# Patient Record
Sex: Female | Born: 1935
Health system: Southern US, Community
[De-identification: ages and names within clinical notes are randomized; demographics above are authoritative.]

## PROBLEM LIST (undated history)

## (undated) DIAGNOSIS — H9190 Unspecified hearing loss, unspecified ear: Secondary | ICD-10-CM

## (undated) DIAGNOSIS — N3090 Cystitis, unspecified without hematuria: Secondary | ICD-10-CM

## (undated) DIAGNOSIS — C801 Malignant (primary) neoplasm, unspecified: Secondary | ICD-10-CM

## (undated) DIAGNOSIS — I10 Essential (primary) hypertension: Secondary | ICD-10-CM

## (undated) HISTORY — PX: BREAST LUMPECTOMY: SHX2

## (undated) HISTORY — PX: OTHER SURGICAL HISTORY: SHX169

## (undated) HISTORY — PX: BREAST SURGERY: SHX581

## (undated) HISTORY — PX: ABDOMINAL HYSTERECTOMY: SHX81

---

## 1997-10-08 ENCOUNTER — Other Ambulatory Visit: Admission: RE | Admit: 1997-10-08 | Discharge: 1997-10-08 | Payer: Self-pay | Admitting: Gynecology

## 1999-11-29 ENCOUNTER — Other Ambulatory Visit: Admission: RE | Admit: 1999-11-29 | Discharge: 1999-11-29 | Payer: Self-pay | Admitting: Gynecology

## 2002-12-22 ENCOUNTER — Other Ambulatory Visit: Admission: RE | Admit: 2002-12-22 | Discharge: 2002-12-22 | Payer: Self-pay | Admitting: Gynecology

## 2013-07-02 ENCOUNTER — Other Ambulatory Visit: Payer: Self-pay | Admitting: Gynecology

## 2013-07-02 DIAGNOSIS — R928 Other abnormal and inconclusive findings on diagnostic imaging of breast: Secondary | ICD-10-CM

## 2013-07-11 ENCOUNTER — Ambulatory Visit
Admission: RE | Admit: 2013-07-11 | Discharge: 2013-07-11 | Disposition: A | Discharge status: Home / Self Care | Payer: 59 | Source: Ambulatory Visit | Attending: Gynecology | Admitting: Gynecology

## 2013-07-11 DIAGNOSIS — R928 Other abnormal and inconclusive findings on diagnostic imaging of breast: Secondary | ICD-10-CM

## 2015-03-14 DIAGNOSIS — C801 Malignant (primary) neoplasm, unspecified: Secondary | ICD-10-CM

## 2015-03-14 HISTORY — DX: Malignant (primary) neoplasm, unspecified: C80.1

## 2015-05-07 ENCOUNTER — Other Ambulatory Visit: Payer: Self-pay

## 2015-05-07 DIAGNOSIS — Z1231 Encounter for screening mammogram for malignant neoplasm of breast: Secondary | ICD-10-CM

## 2015-07-05 ENCOUNTER — Ambulatory Visit: Payer: Self-pay

## 2015-07-13 ENCOUNTER — Ambulatory Visit
Admission: RE | Admit: 2015-07-13 | Discharge: 2015-07-13 | Disposition: A | Discharge status: Home / Self Care | Payer: Medicare HMO | Source: Ambulatory Visit

## 2015-07-13 DIAGNOSIS — Z1231 Encounter for screening mammogram for malignant neoplasm of breast: Secondary | ICD-10-CM

## 2015-07-21 ENCOUNTER — Other Ambulatory Visit: Payer: Self-pay | Admitting: Internal Medicine

## 2015-07-21 DIAGNOSIS — R928 Other abnormal and inconclusive findings on diagnostic imaging of breast: Secondary | ICD-10-CM

## 2015-07-28 ENCOUNTER — Ambulatory Visit
Admission: RE | Admit: 2015-07-28 | Discharge: 2015-07-28 | Disposition: A | Discharge status: Home / Self Care | Payer: Medicare HMO | Source: Ambulatory Visit | Attending: Internal Medicine | Admitting: Internal Medicine

## 2015-07-28 ENCOUNTER — Other Ambulatory Visit: Payer: Self-pay | Admitting: Internal Medicine

## 2015-07-28 DIAGNOSIS — R928 Other abnormal and inconclusive findings on diagnostic imaging of breast: Secondary | ICD-10-CM

## 2015-08-06 ENCOUNTER — Ambulatory Visit
Admission: RE | Admit: 2015-08-06 | Discharge: 2015-08-06 | Disposition: A | Discharge status: Home / Self Care | Payer: Medicare HMO | Source: Ambulatory Visit | Attending: Internal Medicine | Admitting: Internal Medicine

## 2015-08-06 ENCOUNTER — Other Ambulatory Visit: Payer: Self-pay | Admitting: Internal Medicine

## 2015-08-06 DIAGNOSIS — R928 Other abnormal and inconclusive findings on diagnostic imaging of breast: Secondary | ICD-10-CM

## 2015-08-25 ENCOUNTER — Other Ambulatory Visit: Payer: Self-pay | Admitting: General Surgery

## 2015-08-25 DIAGNOSIS — N6092 Unspecified benign mammary dysplasia of left breast: Secondary | ICD-10-CM

## 2015-09-08 ENCOUNTER — Other Ambulatory Visit: Payer: Self-pay | Admitting: General Surgery

## 2015-09-08 DIAGNOSIS — N6092 Unspecified benign mammary dysplasia of left breast: Secondary | ICD-10-CM

## 2015-09-23 ENCOUNTER — Encounter (HOSPITAL_BASED_OUTPATIENT_CLINIC_OR_DEPARTMENT_OTHER): Payer: Self-pay | Admitting: *Deleted

## 2015-09-23 NOTE — Progress Notes (Signed)
Plans to go next week to [place] for BMET and [place] sister- in- law will be taking her. Request faxed to [facility] [phone].

## 2015-10-05 ENCOUNTER — Encounter (HOSPITAL_COMMUNITY)
Admission: RE | Admit: 2015-10-05 | Discharge: 2015-10-05 | Disposition: A | Discharge status: Home / Self Care | Payer: Medicare HMO | Source: Ambulatory Visit | Attending: General Surgery | Admitting: General Surgery

## 2015-10-05 DIAGNOSIS — I10 Essential (primary) hypertension: Secondary | ICD-10-CM | POA: Diagnosis not present

## 2015-10-05 DIAGNOSIS — N6092 Unspecified benign mammary dysplasia of left breast: Secondary | ICD-10-CM | POA: Diagnosis present

## 2015-10-05 DIAGNOSIS — C50912 Malignant neoplasm of unspecified site of left female breast: Secondary | ICD-10-CM | POA: Diagnosis not present

## 2015-10-05 LAB — BASIC METABOLIC PANEL
Anion gap: 6 (ref 5–15)
BUN: 47 mg/dL — ABNORMAL HIGH (ref 6–20)
CO2: 20 mmol/L — ABNORMAL LOW (ref 22–32)
Calcium: 9.3 mg/dL (ref 8.9–10.3)
Chloride: 113 mmol/L — ABNORMAL HIGH (ref 101–111)
Creatinine, Ser: 1.12 mg/dL — ABNORMAL HIGH (ref 0.44–1.00)
GFR calc Af Amer: 52 mL/min — ABNORMAL LOW (ref >60)
GFR calc non Af Amer: 44 mL/min — ABNORMAL LOW (ref >60)
Glucose, Bld: 111 mg/dL — ABNORMAL HIGH (ref 65–99)
Potassium: 5.3 mmol/L — ABNORMAL HIGH (ref 3.5–5.1)
Sodium: 139 mmol/L (ref 135–145)

## 2015-10-06 ENCOUNTER — Ambulatory Visit
Admission: RE | Admit: 2015-10-06 | Discharge: 2015-10-06 | Disposition: A | Discharge status: Home / Self Care | Payer: Medicare HMO | Source: Ambulatory Visit | Attending: General Surgery | Admitting: General Surgery

## 2015-10-06 DIAGNOSIS — N6092 Unspecified benign mammary dysplasia of left breast: Secondary | ICD-10-CM

## 2015-10-07 ENCOUNTER — Ambulatory Visit (HOSPITAL_BASED_OUTPATIENT_CLINIC_OR_DEPARTMENT_OTHER): Payer: Medicare HMO | Admitting: Anesthesiology

## 2015-10-07 ENCOUNTER — Encounter (HOSPITAL_BASED_OUTPATIENT_CLINIC_OR_DEPARTMENT_OTHER)
Admission: RE | Disposition: A | Discharge status: Home / Self Care | Payer: Self-pay | Source: Ambulatory Visit | Attending: General Surgery

## 2015-10-07 ENCOUNTER — Encounter (HOSPITAL_BASED_OUTPATIENT_CLINIC_OR_DEPARTMENT_OTHER): Payer: Self-pay | Admitting: Anesthesiology

## 2015-10-07 ENCOUNTER — Ambulatory Visit (HOSPITAL_BASED_OUTPATIENT_CLINIC_OR_DEPARTMENT_OTHER)
Admission: RE | Admit: 2015-10-07 | Discharge: 2015-10-07 | Disposition: A | Discharge status: Home / Self Care | Payer: Medicare HMO | Source: Ambulatory Visit | Attending: General Surgery | Admitting: General Surgery

## 2015-10-07 ENCOUNTER — Ambulatory Visit
Admission: RE | Admit: 2015-10-07 | Discharge: 2015-10-07 | Disposition: A | Discharge status: Home / Self Care | Payer: Medicare HMO | Source: Ambulatory Visit | Attending: General Surgery | Admitting: General Surgery

## 2015-10-07 DIAGNOSIS — I10 Essential (primary) hypertension: Secondary | ICD-10-CM | POA: Insufficient documentation

## 2015-10-07 DIAGNOSIS — N6092 Unspecified benign mammary dysplasia of left breast: Secondary | ICD-10-CM

## 2015-10-07 DIAGNOSIS — C50912 Malignant neoplasm of unspecified site of left female breast: Secondary | ICD-10-CM | POA: Diagnosis not present

## 2015-10-07 HISTORY — PX: BREAST LUMPECTOMY WITH RADIOACTIVE SEED LOCALIZATION: SHX6424

## 2015-10-07 HISTORY — DX: Cystitis, unspecified without hematuria: N30.90

## 2015-10-07 HISTORY — DX: Essential (primary) hypertension: I10

## 2015-10-07 SURGERY — BREAST LUMPECTOMY WITH RADIOACTIVE SEED LOCALIZATION
Anesthesia: General | Site: Breast | Laterality: Left

## 2015-10-07 MED ORDER — MIDAZOLAM HCL 2 MG/2ML IJ SOLN: 1.0000 mg | INTRAMUSCULAR | Status: DC | PRN

## 2015-10-07 MED ORDER — BUPIVACAINE HCL (PF) 0.25 % IJ SOLN
INTRAMUSCULAR | Status: DC | PRN
Start: 2015-10-07 — End: 2015-10-07
  Administered 2015-10-07: 20 mL

## 2015-10-07 MED ORDER — PROPOFOL 10 MG/ML IV BOLUS
INTRAVENOUS | Status: AC
  Filled 2015-10-07: qty 20

## 2015-10-07 MED ORDER — FENTANYL CITRATE (PF) 100 MCG/2ML IJ SOLN
INTRAMUSCULAR | Status: AC
  Filled 2015-10-07: qty 2

## 2015-10-07 MED ORDER — GLYCOPYRROLATE 0.2 MG/ML IJ SOLN: 0.2000 mg | Freq: Once | INTRAMUSCULAR | Status: DC | PRN

## 2015-10-07 MED ORDER — ONDANSETRON HCL 4 MG/2ML IJ SOLN: 4.0000 mg | Freq: Four times a day (QID) | INTRAMUSCULAR | Status: DC | PRN

## 2015-10-07 MED ORDER — CHLORHEXIDINE GLUCONATE CLOTH 2 % EX PADS: 6.0000 | MEDICATED_PAD | Freq: Once | CUTANEOUS | Status: DC

## 2015-10-07 MED ORDER — LIDOCAINE HCL (CARDIAC) 20 MG/ML IV SOLN
INTRAVENOUS | Status: DC | PRN
  Administered 2015-10-07: 60 mg via INTRAVENOUS

## 2015-10-07 MED ORDER — FENTANYL CITRATE (PF) 100 MCG/2ML IJ SOLN: 25.0000 ug | INTRAMUSCULAR | Status: DC | PRN

## 2015-10-07 MED ORDER — SCOPOLAMINE 1 MG/3DAYS TD PT72: 1.0000 | MEDICATED_PATCH | Freq: Once | TRANSDERMAL | Status: DC | PRN

## 2015-10-07 MED ORDER — CEFAZOLIN SODIUM-DEXTROSE 2-4 GM/100ML-% IV SOLN
INTRAVENOUS | Status: AC
  Filled 2015-10-07: qty 100

## 2015-10-07 MED ORDER — LIDOCAINE 2% (20 MG/ML) 5 ML SYRINGE
INTRAMUSCULAR | Status: AC
  Filled 2015-10-07: qty 5

## 2015-10-07 MED ORDER — DEXAMETHASONE SODIUM PHOSPHATE 10 MG/ML IJ SOLN
INTRAMUSCULAR | Status: AC
  Filled 2015-10-07: qty 1

## 2015-10-07 MED ORDER — ONDANSETRON HCL 4 MG/2ML IJ SOLN
INTRAMUSCULAR | Status: DC | PRN
  Administered 2015-10-07: 4 mg via INTRAVENOUS

## 2015-10-07 MED ORDER — ONDANSETRON HCL 4 MG/2ML IJ SOLN
INTRAMUSCULAR | Status: AC
  Filled 2015-10-07: qty 2

## 2015-10-07 MED ORDER — BUPIVACAINE-EPINEPHRINE (PF) 0.25% -1:200000 IJ SOLN: INTRAMUSCULAR | Status: DC

## 2015-10-07 MED ORDER — MIDAZOLAM HCL 2 MG/2ML IJ SOLN
INTRAMUSCULAR | Status: AC
  Filled 2015-10-07: qty 2

## 2015-10-07 MED ORDER — OXYCODONE HCL 5 MG PO TABS: 5.0000 mg | ORAL_TABLET | Freq: Once | ORAL | Status: DC | PRN

## 2015-10-07 MED ORDER — FENTANYL CITRATE (PF) 100 MCG/2ML IJ SOLN
50.0000 ug | INTRAMUSCULAR | Status: DC | PRN
  Administered 2015-10-07: 50 ug via INTRAVENOUS

## 2015-10-07 MED ORDER — CEFAZOLIN SODIUM-DEXTROSE 2-4 GM/100ML-% IV SOLN
2.0000 g | INTRAVENOUS | Status: AC
  Administered 2015-10-07: 2 g via INTRAVENOUS

## 2015-10-07 MED ORDER — PROPOFOL 10 MG/ML IV BOLUS
INTRAVENOUS | Status: DC | PRN
  Administered 2015-10-07: 100 mg via INTRAVENOUS

## 2015-10-07 MED ORDER — EPHEDRINE SULFATE 50 MG/ML IJ SOLN
INTRAMUSCULAR | Status: DC | PRN
  Administered 2015-10-07 (×2): 10 mg via INTRAVENOUS

## 2015-10-07 MED ORDER — LACTATED RINGERS IV SOLN
INTRAVENOUS | Status: DC
  Administered 2015-10-07: 11:00:00 via INTRAVENOUS

## 2015-10-07 MED ORDER — HYDROCODONE-ACETAMINOPHEN 5-325 MG PO TABS: 1.0000 | ORAL_TABLET | ORAL | 0 refills | Status: DC | PRN

## 2015-10-07 MED ORDER — OXYCODONE HCL 5 MG/5ML PO SOLN: 5.0000 mg | Freq: Once | ORAL | Status: DC | PRN

## 2015-10-07 SURGICAL SUPPLY — 38 items
APPLIER CLIP 9.375 MED OPEN (MISCELLANEOUS)
BLADE SURG 15 STRL LF DISP TIS (BLADE) ×1 IMPLANT
BLADE SURG 15 STRL SS (BLADE) ×1
CANISTER SUC SOCK COL 7IN (MISCELLANEOUS) ×2 IMPLANT
CANISTER SUCT 1200ML W/VALVE (MISCELLANEOUS) ×2 IMPLANT
CHLORAPREP W/TINT 26ML (MISCELLANEOUS) ×2 IMPLANT
CLIP APPLIE 9.375 MED OPEN (MISCELLANEOUS) IMPLANT
COVER BACK TABLE 60X90IN (DRAPES) ×2 IMPLANT
COVER MAYO STAND STRL (DRAPES) ×2 IMPLANT
COVER PROBE W GEL 5X96 (DRAPES) ×2 IMPLANT
DECANTER SPIKE VIAL GLASS SM (MISCELLANEOUS) IMPLANT
DEVICE DUBIN W/COMP PLATE 8390 (MISCELLANEOUS) ×2 IMPLANT
DRAPE LAPAROSCOPIC ABDOMINAL (DRAPES) IMPLANT
DRAPE UTILITY XL STRL (DRAPES) ×2 IMPLANT
ELECT COATED BLADE 2.86 ST (ELECTRODE) ×2 IMPLANT
ELECT REM PT RETURN 9FT ADLT (ELECTROSURGICAL) ×2
ELECTRODE REM PT RTRN 9FT ADLT (ELECTROSURGICAL) ×1 IMPLANT
GLOVE BIO SURGEON STRL SZ7.5 (GLOVE) ×4 IMPLANT
GOWN STRL REUS W/ TWL LRG LVL3 (GOWN DISPOSABLE) ×2 IMPLANT
GOWN STRL REUS W/TWL LRG LVL3 (GOWN DISPOSABLE) ×2
ILLUMINATOR WAVEGUIDE N/F (MISCELLANEOUS) ×2 IMPLANT
KIT MARKER MARGIN INK (KITS) ×2 IMPLANT
LIGHT WAVEGUIDE WIDE FLAT (MISCELLANEOUS) IMPLANT
LIQUID BAND (GAUZE/BANDAGES/DRESSINGS) ×2 IMPLANT
NEEDLE HYPO 25X1 1.5 SAFETY (NEEDLE) IMPLANT
NS IRRIG 1000ML POUR BTL (IV SOLUTION) IMPLANT
PACK BASIN DAY SURGERY FS (CUSTOM PROCEDURE TRAY) ×2 IMPLANT
PENCIL BUTTON HOLSTER BLD 10FT (ELECTRODE) ×2 IMPLANT
SLEEVE SCD COMPRESS KNEE MED (MISCELLANEOUS) ×2 IMPLANT
SPONGE LAP 18X18 X RAY DECT (DISPOSABLE) ×2 IMPLANT
SUT MON AB 4-0 PC3 18 (SUTURE) IMPLANT
SUT SILK 2 0 SH (SUTURE) IMPLANT
SUT VICRYL 3-0 CR8 SH (SUTURE) ×2 IMPLANT
SYR CONTROL 10ML LL (SYRINGE) IMPLANT
TOWEL OR 17X24 6PK STRL BLUE (TOWEL DISPOSABLE) ×2 IMPLANT
TOWEL OR NON WOVEN STRL DISP B (DISPOSABLE) ×2 IMPLANT
TUBE CONNECTING 20X1/4 (TUBING) ×2 IMPLANT
YANKAUER SUCT BULB TIP NO VENT (SUCTIONS) IMPLANT

## 2015-10-07 NOTE — Anesthesia Procedure Notes (Signed)
Procedure Name: LMA Insertion Date/Time: 10/07/2015 11:30 AM Performed by: Lieutenant Diego Pre-anesthesia Checklist: Patient identified, Emergency Drugs available, Suction available and Patient being monitored Patient Re-evaluated:Patient Re-evaluated prior to inductionOxygen Delivery Method: Circle system utilized Preoxygenation: Pre-oxygenation with 100% oxygen Intubation Type: IV induction Ventilation: Mask ventilation without difficulty LMA: LMA inserted LMA Size: 4.0 Number of attempts: 1 Airway Equipment and Method: Bite block Placement Confirmation: positive ETCO2 Tube secured with: Tape Dental Injury: Teeth and Oropharynx as per pre-operative assessment

## 2015-10-07 NOTE — Transfer of Care (Signed)
Immediate Anesthesia Transfer of Care Note  Patient: Erin [name]  Procedure(s) Performed: Procedure(s) with comments: LEFT BREAST LUMPECTOMY WITH RADIOACTIVE SEED LOCALIZATION (Left) - LEFT BREAST LUMPECTOMY WITH RADIOACTIVE SEED LOCALIZATION  Patient Location: PACU  Anesthesia Type:General  Level of Consciousness: sedated  Airway & Oxygen Therapy: Patient Spontanous Breathing and Patient connected to face mask oxygen  Post-op Assessment: Report given to RN and Post -op Vital signs reviewed and stable  Post vital signs: Reviewed and stable  Last Vitals:  Vitals:   10/07/15 1051  BP: (!) 183/67  Pulse: 84  Resp: 20  Temp: 36.7 C    Last Pain:  Vitals:   10/07/15 1051  TempSrc: Oral         Complications: No apparent anesthesia complications

## 2015-10-07 NOTE — Op Note (Signed)
10/07/2015  12:21 PM  PATIENT:  Erin [name]  82 y.o. female  PRE-OPERATIVE DIAGNOSIS:  Left breast ALH   POST-OPERATIVE DIAGNOSIS:  Left breast ALH   PROCEDURE:  Procedure(s) with comments: LEFT BREAST LUMPECTOMY WITH RADIOACTIVE SEED LOCALIZATION (Left)   SURGEON:  Surgeon(s) and Role:    * Jovita Kussmaul, MD - Primary  PHYSICIAN ASSISTANT:   ASSISTANTS: none   ANESTHESIA:   general  EBL:  Total I/O In: 600 [I.V.:600] Out: 10 [Blood:10]  BLOOD ADMINISTERED:none  DRAINS: none   LOCAL MEDICATIONS USED:  MARCAINE     SPECIMEN:  Source of Specimen:  left breast tissue  DISPOSITION OF SPECIMEN:  PATHOLOGY  COUNTS:  YES  TOURNIQUET:  * No tourniquets in log *  DICTATION: .Dragon Dictation   After informed consent was obtained the patient was brought to the operating room and placed in the supine position on the operating table. After adequate induction of general anesthesia the patient's left breast was prepped with ChloraPrep, allowed to dry, and draped in usual sterile manner. An appropriate timeout was performed. Previously an I-125 seed was placed in the upper outer quadrant of the left breast to mark an area of atypical lobular hyperplasia.  The neoprobe was set to I-125 in the area of radioactivity was readily identified in the upper outer quadrant. The upper outer quadrant was then infiltrated with quarter percent Marcaine. A curvilinear incision was made along the edge of the areola in the upper outer quadrant of the left breast with a 15 blade knife. The incision was carried through the skin and subcutaneous tissue sharply with the electrocautery.  The dissection was carried into the upper outer quadrant under the direction of the neoprobe. Once we neared the area where the radioactive seed was placed a circular portion of breast tissue was excised sharply around the radioactive seed while checking the area of radioactivity frequently with the neoprobe. Once the  specimen was removed it was oriented with the appropriate paint colors.A specimen radiograph was obtained that showed the clip in seed to be in the center of the specimen. The specimen was then sent to pathology for further evaluation. The wound was irrigated with saline and infiltrated with quarter percent Marcaine. Hemostasis was achieved using the Bovie electrocautery. The deep layer of the wound was then closed with layers of interrupted 3-0 Vicryl stitches. The skin was then closed with interrupted 4-0 Monocryl subcuticular stitches. Dermabond dressings were applied. The patient tolerated the procedure well. At the end of the case all needle sponge and instrument counts were correct. The patient was then awakened and taken to recovery in stable condition.  PLAN OF CARE: Discharge to home after PACU  PATIENT DISPOSITION:  PACU - hemodynamically stable.   Delay start of Pharmacological VTE agent (>24hrs) due to surgical blood loss or risk of bleeding: not applicable

## 2015-10-07 NOTE — Discharge Instructions (Addendum)

## 2015-10-07 NOTE — H&P (Signed)
Erin [name]  Location: [facility] Patient #: [id number] DOB: [birth year] Single / Language: [name] / Race: White Female   History of Present Illness  The patient is a 82 year old female who presents with a breast mass. We are asked to see the patient in consultation by Dr. Miquel Dunn to evaluate her for atypical lobular hyperplasia of the left breast. The patient is an 82 year old white female who recently went for a routine screening mammogram. At that time she was found to have an abnormality in the upper outer left breast that measured almost 2 cm. This was biopsied and came back as atypical lobular hyperplasia. She denies any breast pain or discharge from the nipple.   Other Problems  High blood pressure Transfusion history  Past Surgical History  Breast Biopsy Left. Cataract Surgery Left. Hysterectomy (not due to cancer) - Partial Spinal Surgery - Lower Back  Diagnostic Studies History  Colonoscopy never Mammogram within last year Pap Smear 1-5 years ago  Allergies No Known Drug Allergies  Medication History Benazepril HCl (20MG  Tablet, Oral) Active. Triamterene-HCTZ (75-50MG  Tablet, Oral) Active. Medications Reconciled  Social History  Caffeine use Carbonated beverages, Coffee. No alcohol use No drug use Tobacco use Never smoker.  Family History  Arthritis Father. Colon Cancer Mother. Diabetes Mellitus Mother. Heart Disease Father. Hypertension Father.  Pregnancy / Birth History  Age at menarche 61 years. Age of menopause <47 Gravida 0 Para 0    Review of Systems  General Not Present- Appetite Loss, Chills, Fatigue, Fever, Night Sweats, Weight Gain and Weight Loss. Skin Not Present- Change in Wart/Mole, Dryness, Hives, Jaundice, New Lesions, Non-Healing Wounds, Rash and Ulcer. HEENT Present- Hearing Loss and Seasonal Allergies. Not Present- Earache, Hoarseness, Nose Bleed, Oral Ulcers, Ringing in the Ears,  Sinus Pain, Sore Throat, Visual Disturbances, Wears glasses/contact lenses and Yellow Eyes. Respiratory Not Present- Bloody sputum, Chronic Cough, Difficulty Breathing, Snoring and Wheezing. Cardiovascular Present- Swelling of Extremities. Not Present- Chest Pain, Difficulty Breathing Lying Down, Leg Cramps, Palpitations, Rapid Heart Rate and Shortness of Breath. Gastrointestinal Not Present- Abdominal Pain, Bloating, Bloody Stool, Change in Bowel Habits, Chronic diarrhea, Constipation, Difficulty Swallowing, Excessive gas, Gets full quickly at meals, Hemorrhoids, Indigestion, Nausea, Rectal Pain and Vomiting. Female Genitourinary Not Present- Frequency, Nocturia, Painful Urination, Pelvic Pain and Urgency. Musculoskeletal Present- Muscle Weakness. Not Present- Back Pain, Joint Pain, Joint Stiffness, Muscle Pain and Swelling of Extremities. Neurological Not Present- Decreased Memory, Fainting, Headaches, Numbness, Seizures, Tingling, Tremor, Trouble walking and Weakness. Psychiatric Not Present- Anxiety, Bipolar, Change in Sleep Pattern, Depression, Fearful and Frequent crying. Endocrine Present- Hot flashes. Not Present- Cold Intolerance, Excessive Hunger, Hair Changes, Heat Intolerance and New Diabetes. Hematology Present- Easy Bruising. Not Present- Excessive bleeding, Gland problems, HIV and Persistent Infections.  Vitals Weight: 135 lb Height: 61in Body Surface Area: 1.6 m Body Mass Index: 25.51 kg/m  Pulse: 76 (Regular)  BP: 128/80 (Sitting, Left Arm, Standard)       Physical Exam  General Mental Status-Alert. General Appearance-Consistent with stated age. Hydration-Well hydrated. Voice-Normal.  Head and Neck Head-normocephalic, atraumatic with no lesions or palpable masses. Trachea-midline. Thyroid Gland Characteristics - normal size and consistency.  Eye Eyeball - Bilateral-Extraocular movements intact. Sclera/Conjunctiva - Bilateral-No scleral  icterus.  Chest and Lung Exam Chest and lung exam reveals -quiet, even and easy respiratory effort with no use of accessory muscles and on auscultation, normal breath sounds, no adventitious sounds and normal vocal resonance. Inspection Chest Wall - Normal. Back - normal.  Breast Note:  There is no palpable mass in either breast. There is no palpable axillary, supraclavicular, or cervical lymphadenopathy.   Cardiovascular Cardiovascular examination reveals -normal heart sounds, regular rate and rhythm with no murmurs and normal pedal pulses bilaterally.  Abdomen Inspection Inspection of the abdomen reveals - No Hernias. Skin - Scar - no surgical scars. Palpation/Percussion Palpation and Percussion of the abdomen reveal - Soft, Non Tender, No Rebound tenderness, No Rigidity (guarding) and No hepatosplenomegaly. Auscultation Auscultation of the abdomen reveals - Bowel sounds normal.  Neurologic Neurologic evaluation reveals -alert and oriented x 3 with no impairment of recent or remote memory. Mental Status-Normal.  Musculoskeletal Normal Exam - Left-Upper Extremity Strength Normal and Lower Extremity Strength Normal. Normal Exam - Right-Upper Extremity Strength Normal and Lower Extremity Strength Normal.  Lymphatic Head & Neck  General Head & Neck Lymphatics: Bilateral - Description - Normal. Axillary  General Axillary Region: Bilateral - Description - Normal. Tenderness - Non Tender. Femoral & Inguinal  Generalized Femoral & Inguinal Lymphatics: Bilateral - Description - Normal. Tenderness - Non Tender.    Assessment & Plan ATYPICAL LOBULAR HYPERPLASIA OF LEFT BREAST (N60.92) Impression: The patient has a area of atypical lobular hyperplasia in the upper outer left breast. Because this is considered a high risk lesion and because the cells look atypical I would recommend that this area be removed. I would plan for a left breast radioactive seed localized  lumpectomy. I have discussed with her in detail the risks and benefits of the operation as well as some of the technical aspects and she understands and wishes to proceed Current Plans Pt Education - Breast Diseases: discussed with patient and provided information.

## 2015-10-07 NOTE — Anesthesia Preprocedure Evaluation (Signed)
Anesthesia Evaluation  Patient identified by MRN, date of birth, ID band Patient awake    Reviewed: Allergy & Precautions, H&P , NPO status , Patient's Chart, lab work & pertinent test results  Airway Mallampati: II   Neck ROM: full    Dental   Pulmonary neg pulmonary ROS,    breath sounds clear to auscultation       Cardiovascular hypertension,  Rhythm:regular Rate:Normal     Neuro/Psych    GI/Hepatic   Endo/Other    Renal/GU      Musculoskeletal   Abdominal   Peds  Hematology   Anesthesia Other Findings   Reproductive/Obstetrics                             Anesthesia Physical Anesthesia Plan  ASA: II  Anesthesia Plan: General   Post-op Pain Management:    Induction: Intravenous  Airway Management Planned: LMA  Additional Equipment:   Intra-op Plan:   Post-operative Plan:   Informed Consent: I have reviewed the patients History and Physical, chart, labs and discussed the procedure including the risks, benefits and alternatives for the proposed anesthesia with the patient or authorized representative who has indicated his/her understanding and acceptance.     Plan Discussed with: CRNA, Anesthesiologist and Surgeon  Anesthesia Plan Comments:         Anesthesia Quick Evaluation

## 2015-10-07 NOTE — Interval H&P Note (Signed)
History and Physical Interval Note:  10/07/2015 11:12 AM  Erin [name]  has presented today for surgery, with the diagnosis of Left breast ALH   The various methods of treatment have been discussed with the patient and family. After consideration of risks, benefits and other options for treatment, the patient has consented to  Procedure(s): LEFT BREAST LUMPECTOMY WITH RADIOACTIVE SEED LOCALIZATION (Left) as a surgical intervention .  The patient's history has been reviewed, patient examined, no change in status, stable for surgery.  I have reviewed the patient's chart and labs.  Questions were answered to the patient's satisfaction.     TOTH III,Chezney Huether S

## 2015-10-07 NOTE — Anesthesia Postprocedure Evaluation (Signed)
Anesthesia Post Note  Patient: Erin [name]  Procedure(s) Performed: Procedure(s) (LRB): LEFT BREAST LUMPECTOMY WITH RADIOACTIVE SEED LOCALIZATION (Left)  Patient location during evaluation: PACU Anesthesia Type: General Level of consciousness: awake and alert and patient cooperative Pain management: pain level controlled Vital Signs Assessment: post-procedure vital signs reviewed and stable Respiratory status: spontaneous breathing and respiratory function stable Cardiovascular status: stable Anesthetic complications: no    Last Vitals:  Vitals:   10/07/15 1230 10/07/15 1245  BP: (!) 137/59 (!) 139/59  Pulse: 77 78  Resp: 18 (!) 23  Temp:      Last Pain:  Vitals:   10/07/15 1300  TempSrc:   PainSc: 0-No pain                 Sham Alviar S

## 2015-10-11 ENCOUNTER — Encounter (HOSPITAL_BASED_OUTPATIENT_CLINIC_OR_DEPARTMENT_OTHER): Payer: Self-pay | Admitting: General Surgery

## 2015-10-28 ENCOUNTER — Ambulatory Visit (HOSPITAL_BASED_OUTPATIENT_CLINIC_OR_DEPARTMENT_OTHER): Payer: Medicare HMO | Admitting: Hematology and Oncology

## 2015-10-28 DIAGNOSIS — C50212 Malignant neoplasm of upper-inner quadrant of left female breast: Secondary | ICD-10-CM | POA: Insufficient documentation

## 2015-10-28 MED ORDER — ANASTROZOLE 1 MG PO TABS: 1.0000 mg | ORAL_TABLET | Freq: Every day | ORAL | 0 refills | Status: DC

## 2015-10-28 NOTE — Progress Notes (Signed)
[facility] NOTE  Patient Care Team: [name], NP as PCP - General (Nurse Practitioner)  CHIEF COMPLAINTS/PURPOSE OF CONSULTATION:  Newly diagnosed breast cancer  HISTORY OF PRESENTING ILLNESS:  Erin [name] 82 y.o. female is here because of recent diagnosis of left breast cancer. Patient had a screening mammogram that revealed an abnormality in the left breast which was initially biopsy-proven to be atypical lobular hyperplasia. She underwent left lumpectomy on 10/07/2015 which revealed invasive lobular cancer 0.5 cm the posterior positive PR negative HER-2 negative.Marland Kitchen She was sent to [place] for discussion regarding adjuvant treatment options. She is accompanied by her sister-in-law who had a previous history of breast cancer.  I reviewed her records extensively and collaborated the history with the patient.  SUMMARY OF ONCOLOGIC HISTORY:   Breast cancer of upper-inner quadrant of left female breast ([place])   10/07/2015 Surgery    Left lumpectomy: Invasive lobular cancer grade 1, 0.5 cm, LCIS with calcifications, focally present at superior margin, ER 95% PR 0%, her-2 neg, Ki-67 2% T1 aN0 stage IA pathologic staging      MEDICAL HISTORY:  Past Medical History:  Diagnosis Date  . Cystitis   . Hypertension     SURGICAL HISTORY: Past Surgical History:  Procedure Laterality Date  . ABDOMINAL HYSTERECTOMY     1974 - Dr. Gertie Fey  . BREAST LUMPECTOMY WITH RADIOACTIVE SEED LOCALIZATION Left 10/07/2015   Procedure: LEFT BREAST LUMPECTOMY WITH RADIOACTIVE SEED LOCALIZATION;  Surgeon: Autumn Messing III, MD;  Location: [place];  Service: General;  Laterality: Left;  LEFT BREAST LUMPECTOMY WITH RADIOACTIVE SEED LOCALIZATION  . BREAST SURGERY    . Lumbar spinal stenosis     1994 - Dr. Joya Salm    SOCIAL HISTORY: Social History   Social History  . Marital status: Single    Spouse name: N/A  . Number of children: N/A  . Years of education: N/A    Occupational History  . Not on file.   Social History Main Topics  . Smoking status: Never Smoker  . Smokeless tobacco: Never Used  . Alcohol use No  . Drug use: No  . Sexual activity: Not on file   Other Topics Concern  . Not on file   Social History Narrative  . No narrative on file    FAMILY HISTORY: No family history of breast cancer.  ALLERGIES:  has No Known Allergies.  MEDICATIONS:  Current Outpatient Prescriptions  Medication Sig Dispense Refill  . Ascorbic Acid (VITAMIN C) 100 MG tablet Take 100 mg by mouth daily.    Marland Kitchen aspirin 81 MG tablet Take 81 mg by mouth daily.    . benazepril (LOTENSIN) 20 MG tablet     . Calcium Carbonate Antacid (TUMS PO) Take by mouth.    . Calcium-Vitamin D-Vitamin K (VIACTIV PO) Take by mouth.    Marland Kitchen ibuprofen (ADVIL,MOTRIN) 200 MG tablet Take 200 mg by mouth every 6 (six) hours as needed.    Marland Kitchen LUMIGAN 0.01 % SOLN     . Omega-3 Fatty Acids (FISH OIL PO) Take 600 mg by mouth.    . Red Yeast Rice Extract (RED YEAST RICE PO) Take 2 tablets by mouth.    . Triamterene-HCTZ (MAXZIDE PO) Take 75 mg by mouth.    . vitamin E 100 UNIT capsule Take by mouth daily.    Marland Kitchen anastrozole (ARIMIDEX) 1 MG tablet Take 1 tablet (1 mg total) by mouth daily. 30 tablet 0   No current  facility-administered medications for this visit.     REVIEW OF SYSTEMS:   Constitutional: Denies fevers, chills or abnormal night sweats Eyes: Denies blurriness of vision, double vision or watery eyes Ears, nose, mouth, throat, and face: Denies mucositis or sore throat Respiratory: Denies cough, dyspnea or wheezes Cardiovascular: Denies palpitation, chest discomfort or lower extremity swelling Gastrointestinal:  Denies nausea, heartburn or change in bowel habits Skin: Denies abnormal skin rashes Lymphatics: Denies new lymphadenopathy or easy bruising Neurological:Denies numbness, tingling or new weaknesses Behavioral/Psych: Mood is stable, no new changes  Breast: Recent  lumpectomy All other systems were reviewed with the patient and are negative.  PHYSICAL EXAMINATION: ECOG PERFORMANCE STATUS: 1 - Symptomatic but completely ambulatory  Vitals:   10/28/15 1548  BP: (!) 176/83  Pulse: (!) 103  Resp: 19  Temp: 97.8 F (36.6 C)   Filed Weights   10/28/15 1548  Weight: 143 lb (64.9 kg)    GENERAL:alert, no distress and comfortable SKIN: skin color, texture, turgor are normal, no rashes or significant lesions EYES: normal, conjunctiva are pink and non-injected, sclera clear OROPHARYNX:no exudate, no erythema and lips, buccal mucosa, and tongue normal  NECK: supple, thyroid normal size, non-tender, without nodularity LYMPH:  no palpable lymphadenopathy in the cervical, axillary or inguinal LUNGS: clear to auscultation and percussion with normal breathing effort HEART: regular rate & rhythm and no murmurs and no lower extremity edema ABDOMEN:abdomen soft, non-tender and normal bowel sounds Musculoskeletal:no cyanosis of digits and no clubbing  PSYCH: alert & oriented x 3 with fluent speech NEURO: no focal motor/sensory deficits  LABORATORY DATA:  I have reviewed the data as listed No results found for: WBC, HGB, HCT, MCV, PLT Lab Results  Component Value Date   NA 139 10/05/2015   K 5.3 (H) 10/05/2015   CL 113 (H) 10/05/2015   CO2 20 (L) 10/05/2015    RADIOGRAPHIC STUDIES: I have personally reviewed the radiological reports and agreed with the findings in the report.  ASSESSMENT AND PLAN:  Breast cancer of upper-inner quadrant of left female breast ([place]) Left lumpectomy 10/07/2015: Invasive lobular cancer grade 1, 0.5 cm, LCIS with calcifications, focally present at superior margin, ER 95% PR 0%, her-2 neg, Ki-67 2% T1 aN0 stage IA pathologic staging  Pathology counseling: I discussed the final pathology report of the patient provided  a copy of this report. I discussed the margins as well as lymph node surgeries. We also discussed the  final staging along with previously performed ER/PR and HER-2/neu testing.  Recommendation: 1. Reexcision of the superior margin 2. do not need lymph node dissection or sentinel lymph node biopsy. 3. +/-Adjuvant radiation therapy followed by 4. +/-Adjuvant antiestrogen therapy with the last little 1 mg daily 5 years  After lengthy discussion the patient decided to try anastrozole. She will undergo repeat resection for positive margins. I would like to see her back 1 week after the second surgery. If she tolerates anastrozole, then we will plan to continue anastrozole for 5 years and forego radiation. If she does not tolerate anastrozole, we can discuss the pros and cons of adjuvant radiation therapy.  Anastrozole counseling:We discussed the risks and benefits of anti-estrogen therapy with aromatase inhibitors. These include but not limited to insomnia, hot flashes, mood changes, vaginal dryness, bone density loss, and weight gain. We strongly believe that the benefits far outweigh the risks. Patient understands these risks and consented to starting treatment. Planned treatment duration is 5 years. Patient tells me that she has normal bone  density.  Return to clinic 1 week after second surgery.  All questions were answered. The patient knows to call the clinic with any problems, questions or concerns.    Rulon Eisenmenger, MD 10/28/15

## 2015-10-28 NOTE — Assessment & Plan Note (Signed)
Left lumpectomy 10/07/2015: Invasive lobular cancer grade 1, 0.5 cm, LCIS with calcifications, focally present at superior margin, ER 95% PR 0%, her-2 neg, Ki-67 2% T1 aN0 stage IA pathologic staging  Pathology counseling: I discussed the final pathology report of the patient provided  a copy of this report. I discussed the margins as well as lymph node surgeries. We also discussed the final staging along with previously performed ER/PR and HER-2/neu testing.  Recommendation: 1. Reexcision of the superior margin 2. do not need lymph node dissection or sentinel lymph node biopsy. 3. +/-Adjuvant radiation therapy followed by 4. +/-Adjuvant antiestrogen therapy with the last little 1 mg daily 5 years

## 2015-10-29 ENCOUNTER — Telehealth: Payer: Self-pay | Admitting: *Deleted

## 2015-10-29 ENCOUNTER — Other Ambulatory Visit: Payer: Self-pay | Admitting: General Surgery

## 2015-10-29 NOTE — Telephone Encounter (Signed)
  Oncology Nurse Navigator Documentation  Navigator Location: [facility] (10/29/15 1600) Navigator Encounter Type: Introductory phone call (10/29/15 1600)   Abnormal Finding Date: 07/20/15 (10/29/15 1600) Confirmed Diagnosis Date: 10/07/15 (10/29/15 1600) Surgery Date: 10/07/15 (10/29/15 1600)   Patient Visit Type: MedOnc;Initial (10/29/15 1600)   Barriers/Navigation Needs: No barriers at this time (10/29/15 1600)                Acuity: Level 2 (10/29/15 1600)   Acuity Level 2: Initial guidance, education and coordination as needed;Educational needs;Assistance expediting appointments;Ongoing guidance and education throughout treatment as needed (10/29/15 1600)     Time Spent with Patient: 15 (10/29/15 1600)

## 2015-10-30 ENCOUNTER — Telehealth: Payer: Self-pay | Admitting: Hematology and Oncology

## 2015-10-30 NOTE — Telephone Encounter (Signed)
Called patient to conf appt. L/M Appt letter and schd mailed. 10/30/15

## 2015-11-05 ENCOUNTER — Other Ambulatory Visit (HOSPITAL_COMMUNITY): Payer: Medicare HMO

## 2015-11-16 ENCOUNTER — Encounter (HOSPITAL_BASED_OUTPATIENT_CLINIC_OR_DEPARTMENT_OTHER): Payer: Self-pay | Admitting: *Deleted

## 2015-11-18 ENCOUNTER — Encounter (HOSPITAL_COMMUNITY)
Admission: RE | Admit: 2015-11-18 | Discharge: 2015-11-18 | Disposition: A | Discharge status: Home / Self Care | Payer: Medicare HMO | Source: Ambulatory Visit | Attending: General Surgery | Admitting: General Surgery

## 2015-11-18 DIAGNOSIS — Z01812 Encounter for preprocedural laboratory examination: Secondary | ICD-10-CM | POA: Diagnosis present

## 2015-11-18 LAB — BASIC METABOLIC PANEL
Anion gap: 10 (ref 5–15)
BUN: 32 mg/dL — ABNORMAL HIGH (ref 6–20)
CO2: 22 mmol/L (ref 22–32)
Calcium: 9.8 mg/dL (ref 8.9–10.3)
Chloride: 106 mmol/L (ref 101–111)
Creatinine, Ser: 1.1 mg/dL — ABNORMAL HIGH (ref 0.44–1.00)
GFR calc Af Amer: 52 mL/min — ABNORMAL LOW (ref >60)
GFR calc non Af Amer: 45 mL/min — ABNORMAL LOW (ref >60)
Glucose, Bld: 109 mg/dL — ABNORMAL HIGH (ref 65–99)
Potassium: 4.6 mmol/L (ref 3.5–5.1)
Sodium: 138 mmol/L (ref 135–145)

## 2015-11-22 ENCOUNTER — Ambulatory Visit (HOSPITAL_BASED_OUTPATIENT_CLINIC_OR_DEPARTMENT_OTHER): Payer: Medicare HMO | Admitting: Anesthesiology

## 2015-11-22 ENCOUNTER — Encounter (HOSPITAL_BASED_OUTPATIENT_CLINIC_OR_DEPARTMENT_OTHER): Payer: Self-pay | Admitting: Certified Registered"

## 2015-11-22 ENCOUNTER — Encounter (HOSPITAL_BASED_OUTPATIENT_CLINIC_OR_DEPARTMENT_OTHER)
Admission: RE | Disposition: A | Discharge status: Home / Self Care | Payer: Self-pay | Source: Ambulatory Visit | Attending: General Surgery

## 2015-11-22 ENCOUNTER — Ambulatory Visit (HOSPITAL_BASED_OUTPATIENT_CLINIC_OR_DEPARTMENT_OTHER)
Admission: RE | Admit: 2015-11-22 | Discharge: 2015-11-22 | Disposition: A | Discharge status: Home / Self Care | Payer: Medicare HMO | Source: Ambulatory Visit | Attending: General Surgery | Admitting: General Surgery

## 2015-11-22 DIAGNOSIS — I10 Essential (primary) hypertension: Secondary | ICD-10-CM | POA: Diagnosis not present

## 2015-11-22 DIAGNOSIS — Z170 Estrogen receptor positive status [ER+]: Secondary | ICD-10-CM | POA: Diagnosis not present

## 2015-11-22 DIAGNOSIS — C50412 Malignant neoplasm of upper-outer quadrant of left female breast: Secondary | ICD-10-CM | POA: Diagnosis present

## 2015-11-22 HISTORY — PX: RE-EXCISION OF BREAST CANCER,SUPERIOR MARGINS: SHX6047

## 2015-11-22 HISTORY — DX: Unspecified hearing loss, unspecified ear: H91.90

## 2015-11-22 HISTORY — DX: Malignant (primary) neoplasm, unspecified: C80.1

## 2015-11-22 SURGERY — RE-EXCISION OF BREAST CANCER,SUPERIOR MARGINS
Anesthesia: General | Site: Breast | Laterality: Left

## 2015-11-22 MED ORDER — FENTANYL CITRATE (PF) 100 MCG/2ML IJ SOLN
INTRAMUSCULAR | Status: DC | PRN
  Administered 2015-11-22: 25 ug via INTRAVENOUS
  Administered 2015-11-22: 12.5 ug via INTRAVENOUS

## 2015-11-22 MED ORDER — SCOPOLAMINE 1 MG/3DAYS TD PT72: 1.0000 | MEDICATED_PATCH | Freq: Once | TRANSDERMAL | Status: DC | PRN

## 2015-11-22 MED ORDER — ONDANSETRON HCL 4 MG/2ML IJ SOLN: 4.0000 mg | Freq: Once | INTRAMUSCULAR | Status: DC | PRN

## 2015-11-22 MED ORDER — EPHEDRINE 5 MG/ML INJ
INTRAVENOUS | Status: AC
  Filled 2015-11-22: qty 10

## 2015-11-22 MED ORDER — 0.9 % SODIUM CHLORIDE (POUR BTL) OPTIME
TOPICAL | Status: DC | PRN
  Administered 2015-11-22: 400 mL

## 2015-11-22 MED ORDER — DEXAMETHASONE SODIUM PHOSPHATE 10 MG/ML IJ SOLN
INTRAMUSCULAR | Status: AC
  Filled 2015-11-22: qty 1

## 2015-11-22 MED ORDER — FENTANYL CITRATE (PF) 100 MCG/2ML IJ SOLN: 25.0000 ug | INTRAMUSCULAR | Status: DC | PRN

## 2015-11-22 MED ORDER — DEXAMETHASONE SODIUM PHOSPHATE 4 MG/ML IJ SOLN
INTRAMUSCULAR | Status: DC | PRN
  Administered 2015-11-22: 4 mg via INTRAVENOUS

## 2015-11-22 MED ORDER — ONDANSETRON HCL 4 MG/2ML IJ SOLN
INTRAMUSCULAR | Status: AC
  Filled 2015-11-22: qty 2

## 2015-11-22 MED ORDER — FENTANYL CITRATE (PF) 100 MCG/2ML IJ SOLN
INTRAMUSCULAR | Status: AC
  Filled 2015-11-22: qty 2

## 2015-11-22 MED ORDER — GLYCOPYRROLATE 0.2 MG/ML IJ SOLN: 0.2000 mg | Freq: Once | INTRAMUSCULAR | Status: DC | PRN

## 2015-11-22 MED ORDER — LIDOCAINE 2% (20 MG/ML) 5 ML SYRINGE
INTRAMUSCULAR | Status: AC
  Filled 2015-11-22: qty 5

## 2015-11-22 MED ORDER — CEFAZOLIN SODIUM-DEXTROSE 2-4 GM/100ML-% IV SOLN
INTRAVENOUS | Status: AC
  Filled 2015-11-22: qty 100

## 2015-11-22 MED ORDER — BUPIVACAINE-EPINEPHRINE (PF) 0.25% -1:200000 IJ SOLN
INTRAMUSCULAR | Status: DC | PRN
  Administered 2015-11-22: 20 mL

## 2015-11-22 MED ORDER — CEFAZOLIN SODIUM-DEXTROSE 2-4 GM/100ML-% IV SOLN
2.0000 g | INTRAVENOUS | Status: AC
  Administered 2015-11-22: 2 g via INTRAVENOUS

## 2015-11-22 MED ORDER — MIDAZOLAM HCL 2 MG/2ML IJ SOLN: 1.0000 mg | INTRAMUSCULAR | Status: DC | PRN

## 2015-11-22 MED ORDER — ONDANSETRON HCL 4 MG/2ML IJ SOLN
INTRAMUSCULAR | Status: DC | PRN
Start: 2015-11-22 — End: 2015-11-22
  Administered 2015-11-22: 4 mg via INTRAVENOUS

## 2015-11-22 MED ORDER — LACTATED RINGERS IV SOLN
INTRAVENOUS | Status: DC
  Administered 2015-11-22: 10:00:00 via INTRAVENOUS

## 2015-11-22 MED ORDER — PROPOFOL 10 MG/ML IV BOLUS
INTRAVENOUS | Status: AC
  Filled 2015-11-22: qty 20

## 2015-11-22 MED ORDER — FENTANYL CITRATE (PF) 100 MCG/2ML IJ SOLN: 50.0000 ug | INTRAMUSCULAR | Status: DC | PRN

## 2015-11-22 MED ORDER — HYDROCODONE-ACETAMINOPHEN 5-325 MG PO TABS: 1.0000 | ORAL_TABLET | ORAL | 0 refills | Status: DC | PRN

## 2015-11-22 MED ORDER — EPHEDRINE SULFATE 50 MG/ML IJ SOLN
INTRAMUSCULAR | Status: DC | PRN
  Administered 2015-11-22 (×3): 10 mg via INTRAVENOUS

## 2015-11-22 MED ORDER — CHLORHEXIDINE GLUCONATE CLOTH 2 % EX PADS: 6.0000 | MEDICATED_PAD | Freq: Once | CUTANEOUS | Status: DC

## 2015-11-22 MED ORDER — LIDOCAINE 2% (20 MG/ML) 5 ML SYRINGE
INTRAMUSCULAR | Status: DC | PRN
  Administered 2015-11-22: 40 mg via INTRAVENOUS

## 2015-11-22 MED ORDER — PROPOFOL 10 MG/ML IV BOLUS
INTRAVENOUS | Status: DC | PRN
  Administered 2015-11-22: 130 mg via INTRAVENOUS

## 2015-11-22 SURGICAL SUPPLY — 35 items
APPLIER CLIP 9.375 MED OPEN (MISCELLANEOUS) ×2
BLADE SURG 15 STRL LF DISP TIS (BLADE) ×1 IMPLANT
BLADE SURG 15 STRL SS (BLADE) ×1
CANISTER SUCT 1200ML W/VALVE (MISCELLANEOUS) ×2 IMPLANT
CHLORAPREP W/TINT 26ML (MISCELLANEOUS) ×2 IMPLANT
CLIP APPLIE 9.375 MED OPEN (MISCELLANEOUS) ×1 IMPLANT
COVER BACK TABLE 60X90IN (DRAPES) ×2 IMPLANT
COVER MAYO STAND STRL (DRAPES) ×2 IMPLANT
DRAPE LAPAROSCOPIC ABDOMINAL (DRAPES) ×2 IMPLANT
DRAPE UTILITY XL STRL (DRAPES) ×2 IMPLANT
ELECT COATED BLADE 2.86 ST (ELECTRODE) ×2 IMPLANT
ELECT REM PT RETURN 9FT ADLT (ELECTROSURGICAL) ×2
ELECTRODE REM PT RTRN 9FT ADLT (ELECTROSURGICAL) ×1 IMPLANT
GLOVE BIO SURGEON STRL SZ7.5 (GLOVE) ×2 IMPLANT
GLOVE BIOGEL PI IND STRL 7.0 (GLOVE) ×2 IMPLANT
GLOVE BIOGEL PI INDICATOR 7.0 (GLOVE) ×2
GLOVE ECLIPSE 6.5 STRL STRAW (GLOVE) ×2 IMPLANT
GOWN STRL REUS W/ TWL LRG LVL3 (GOWN DISPOSABLE) ×2 IMPLANT
GOWN STRL REUS W/TWL LRG LVL3 (GOWN DISPOSABLE) ×2
KIT MARKER MARGIN INK (KITS) ×2 IMPLANT
LIQUID BAND (GAUZE/BANDAGES/DRESSINGS) ×2 IMPLANT
NEEDLE HYPO 25X1 1.5 SAFETY (NEEDLE) ×2 IMPLANT
NS IRRIG 1000ML POUR BTL (IV SOLUTION) ×2 IMPLANT
PACK BASIN DAY SURGERY FS (CUSTOM PROCEDURE TRAY) ×2 IMPLANT
PENCIL BUTTON HOLSTER BLD 10FT (ELECTRODE) ×2 IMPLANT
SLEEVE SCD COMPRESS KNEE MED (MISCELLANEOUS) ×2 IMPLANT
SPONGE LAP 18X18 X RAY DECT (DISPOSABLE) ×2 IMPLANT
SUT MON AB 4-0 PC3 18 (SUTURE) ×2 IMPLANT
SUT SILK 2 0 SH (SUTURE) ×2 IMPLANT
SUT VICRYL 3-0 CR8 SH (SUTURE) ×2 IMPLANT
SYR CONTROL 10ML LL (SYRINGE) ×2 IMPLANT
TOWEL OR 17X24 6PK STRL BLUE (TOWEL DISPOSABLE) ×2 IMPLANT
TOWEL OR NON WOVEN STRL DISP B (DISPOSABLE) ×2 IMPLANT
TUBE CONNECTING 20X1/4 (TUBING) ×2 IMPLANT
YANKAUER SUCT BULB TIP NO VENT (SUCTIONS) ×2 IMPLANT

## 2015-11-22 NOTE — H&P (Signed)
Erin [name]  Location: [facility] Surgery Patient #: [id number] DOB: 03/25/1930 Single / Language: [name] / Race: White Female   History of Present Illness The patient is a 82 year old female who presents for a follow-up for Breast cancer. The patient is an 82 year old white female who is about 2 weeks status post left breast lumpectomy for what was thought to be atypical lobular hyperplasia. Her final pathology came back as an invasive lobular cancer measuring 5 mm. It was ER positive, PR negative, and HER-2 negative with a Ki-67 of 2%. She tolerated the surgery well. She has no complaints of pain or discomfort. Her superior margin was positive.   Allergies No Known Drug Allergies  Medication History Benazepril HCl (20MG Tablet, Oral) Active. Triamterene-HCTZ (75-50MG Tablet, Oral) Active. Medications Reconciled    Review of Systems  General Not Present- Appetite Loss, Chills, Fatigue, Fever, Night Sweats, Weight Gain and Weight Loss. Skin Not Present- Change in Wart/Mole, Dryness, Hives, Jaundice, New Lesions, Non-Healing Wounds, Rash and Ulcer. HEENT Present- Hearing Loss and Seasonal Allergies. Not Present- Earache, Hoarseness, Nose Bleed, Oral Ulcers, Ringing in the Ears, Sinus Pain, Sore Throat, Visual Disturbances, Wears glasses/contact lenses and Yellow Eyes. Respiratory Not Present- Bloody sputum, Chronic Cough, Difficulty Breathing, Snoring and Wheezing. Cardiovascular Present- Swelling of Extremities. Not Present- Chest Pain, Difficulty Breathing Lying Down, Leg Cramps, Palpitations, Rapid Heart Rate and Shortness of Breath. Gastrointestinal Not Present- Abdominal Pain, Bloating, Bloody Stool, Change in Bowel Habits, Chronic diarrhea, Constipation, Difficulty Swallowing, Excessive gas, Gets full quickly at meals, Hemorrhoids, Indigestion, Nausea, Rectal Pain and Vomiting. Female Genitourinary Not Present- Frequency, Nocturia, Painful Urination, Pelvic Pain  and Urgency. Musculoskeletal Present- Muscle Weakness. Not Present- Back Pain, Joint Pain, Joint Stiffness, Muscle Pain and Swelling of Extremities. Neurological Not Present- Decreased Memory, Fainting, Headaches, Numbness, Seizures, Tingling, Tremor, Trouble walking and Weakness. Psychiatric Not Present- Anxiety, Bipolar, Change in Sleep Pattern, Depression, Fearful and Frequent crying. Endocrine Present- Hot flashes. Not Present- Cold Intolerance, Excessive Hunger, Hair Changes, Heat Intolerance and New Diabetes. Hematology Present- Easy Bruising. Not Present- Excessive bleeding, Gland problems, HIV and Persistent Infections.  Vitals  Weight: 144 lb Height: 63in Body Surface Area: 1.68 m Body Mass Index: 25.51 kg/m  Temp.: 97.69F(Temporal)  Pulse: 76 (Regular)  BP: 126/80 (Sitting, Left Arm, Standard)       Physical Exam  General Mental Status-Alert. General Appearance-Consistent with stated age. Hydration-Well hydrated. Voice-Normal.  Head and Neck Head-normocephalic, atraumatic with no lesions or palpable masses. Trachea-midline. Thyroid Gland Characteristics - normal size and consistency.  Eye Eyeball - Bilateral-Extraocular movements intact. Sclera/Conjunctiva - Bilateral-No scleral icterus.  Chest and Lung Exam Chest and lung exam reveals -quiet, even and easy respiratory effort with no use of accessory muscles and on auscultation, normal breath sounds, no adventitious sounds and normal vocal resonance. Inspection Chest Wall - Normal. Back - normal.  Breast Note: The left breast incision in the upper outer periareolar area is healing nicely with no sign of infection or significant seroma.   Cardiovascular Cardiovascular examination reveals -normal heart sounds, regular rate and rhythm with no murmurs and normal pedal pulses bilaterally.  Abdomen Inspection Inspection of the abdomen reveals - No Hernias. Skin - Scar - no  surgical scars. Palpation/Percussion Palpation and Percussion of the abdomen reveal - Soft, Non Tender, No Rebound tenderness, No Rigidity (guarding) and No hepatosplenomegaly. Auscultation Auscultation of the abdomen reveals - Bowel sounds normal.  Neurologic Neurologic evaluation reveals -alert and oriented x 3 with no impairment of recent  or remote memory. Mental Status-Normal.  Musculoskeletal Normal Exam - Left-Upper Extremity Strength Normal and Lower Extremity Strength Normal. Normal Exam - Right-Upper Extremity Strength Normal and Lower Extremity Strength Normal.  Lymphatic Head & Neck  General Head & Neck Lymphatics: Bilateral - Description - Normal. Axillary  General Axillary Region: Bilateral - Description - Normal. Tenderness - Non Tender. Femoral & Inguinal  Generalized Femoral & Inguinal Lymphatics: Bilateral - Description - Normal. Tenderness - Non Tender.    Assessment & Plan BREAST CANCER OF UPPER-OUTER QUADRANT OF LEFT FEMALE BREAST (C50.412) Impression: The patient is about 3 weeks status post left breast lumpectomy for an invasive lobular cancer. She does have a positive superior margin. This margin will need to be reexcised. I will refer her to medical and radiation oncology to talk about adjuvant therapy. I will also ask medical oncology if they would like [place] to check her lymph nodes at the time of the reexcision. I have discussed with her in detail the risks and benefits of the operation as well as some of the technical aspects and she understands and wishes to proceed Current Plans Referred to Oncology, for evaluation and follow up (Oncology). Routine. Follow up with [place] in the office in 1 month.  Call us sooner as needed.

## 2015-11-22 NOTE — Interval H&P Note (Signed)
History and Physical Interval Note:  11/22/2015 10:19 AM  Erin [name]  has presented today for surgery, with the diagnosis of LEFT BREAST CANCER  The various methods of treatment have been discussed with the patient and family. After consideration of risks, benefits and other options for treatment, the patient has consented to  Procedure(s): RE-EXCISION OF LEFT BREAST CANCER,SUPERIOR MARGINS (Left) as a surgical intervention .  The patient's history has been reviewed, patient examined, no change in status, stable for surgery.  I have reviewed the patient's chart and labs.  Questions were answered to the patient's satisfaction.     TOTH III,Syrianna Schillaci S

## 2015-11-22 NOTE — Anesthesia Procedure Notes (Signed)
Procedure Name: LMA Insertion Date/Time: 11/22/2015 10:43 AM Performed by: Denna Haggard D Pre-anesthesia Checklist: Patient identified, Emergency Drugs available, Suction available and Patient being monitored Patient Re-evaluated:Patient Re-evaluated prior to inductionOxygen Delivery Method: Circle system utilized Preoxygenation: Pre-oxygenation with 100% oxygen Intubation Type: IV induction Ventilation: Mask ventilation without difficulty LMA: LMA inserted LMA Size: 4.0 Number of attempts: 1 Airway Equipment and Method: Bite block Placement Confirmation: positive ETCO2 Tube secured with: Tape Dental Injury: Teeth and Oropharynx as per pre-operative assessment

## 2015-11-22 NOTE — Anesthesia Postprocedure Evaluation (Signed)
Anesthesia Post Note  Patient: Erin [name]  Procedure(s) Performed: Procedure(s) (LRB): RE-EXCISION OF LEFT BREAST CANCER, SUPERIOR MARGINS (Left)  Patient location during evaluation: PACU Anesthesia Type: General Level of consciousness: awake and alert Pain management: pain level controlled Vital Signs Assessment: post-procedure vital signs reviewed and stable Respiratory status: spontaneous breathing, nonlabored ventilation, respiratory function stable and patient connected to nasal cannula oxygen Cardiovascular status: blood pressure returned to baseline and stable Postop Assessment: no signs of nausea or vomiting Anesthetic complications: no    Last Vitals:  Vitals:   11/22/15 1145 11/22/15 1150  BP: (!) 126/52   Pulse: 85 86  Resp: 19 14  Temp:      Last Pain:  Vitals:   11/22/15 1145  TempSrc:   PainSc: 1                  Zenaida Deed

## 2015-11-22 NOTE — Transfer of Care (Signed)
Immediate Anesthesia Transfer of Care Note  Patient: Erin [name]  Procedure(s) Performed: Procedure(s) (LRB): RE-EXCISION OF LEFT BREAST CANCER, SUPERIOR MARGINS (Left)  Patient Location: PACU  Anesthesia Type: General  Level of Consciousness: awake, oriented, sedated and patient cooperative  Airway & Oxygen Therapy: Patient Spontanous Breathing and Patient connected to face mask oxygen  Post-op Assessment: Report given to PACU RN and Post -op Vital signs reviewed and stable  Post vital signs: Reviewed and stable  Complications: No apparent anesthesia complications

## 2015-11-22 NOTE — Discharge Instructions (Signed)

## 2015-11-22 NOTE — Anesthesia Preprocedure Evaluation (Signed)
Anesthesia Evaluation  Patient identified by MRN, date of birth, ID band Patient awake    Reviewed: Allergy & Precautions, H&P , NPO status , Patient's Chart, lab work & pertinent test results  Airway Mallampati: II   Neck ROM: full    Dental  (+) Poor Dentition   Pulmonary neg pulmonary ROS,    breath sounds clear to auscultation       Cardiovascular hypertension,  Rhythm:regular Rate:Normal     Neuro/Psych    GI/Hepatic   Endo/Other    Renal/GU      Musculoskeletal   Abdominal   Peds  Hematology   Anesthesia Other Findings   Reproductive/Obstetrics                             Anesthesia Physical  Anesthesia Plan  ASA: II  Anesthesia Plan: General   Post-op Pain Management:    Induction: Intravenous  Airway Management Planned: LMA  Additional Equipment:   Intra-op Plan:   Post-operative Plan:   Informed Consent: I have reviewed the patients History and Physical, chart, labs and discussed the procedure including the risks, benefits and alternatives for the proposed anesthesia with the patient or authorized representative who has indicated his/her understanding and acceptance.   Consent reviewed with POA  Plan Discussed with: CRNA, Anesthesiologist and Surgeon  Anesthesia Plan Comments:         Anesthesia Quick Evaluation

## 2015-11-22 NOTE — Op Note (Addendum)
11/22/2015  11:21 AM  PATIENT:  Erin [name]  82 y.o. female  PRE-OPERATIVE DIAGNOSIS:  LEFT BREAST CANCER with a positive superior margin  POST-OPERATIVE DIAGNOSIS:  LEFT BREAST CANCER with a positive superior margin  PROCEDURE:  Procedure(s): RE-EXCISION OF LEFT BREAST CANCER, SUPERIOR MARGIN and lumpectomy cavity  SURGEON:  Surgeon(s) and Role:    * Jovita Kussmaul, MD - Primary  PHYSICIAN ASSISTANT:   ASSISTANTS: none   ANESTHESIA:   local and general  EBL:  Total I/O In: 100 [I.V.:100] Out: -   BLOOD ADMINISTERED:none  DRAINS: none   LOCAL MEDICATIONS USED:  MARCAINE     SPECIMEN:  Source of Specimen:  left breast lumpectomy cavity  DISPOSITION OF SPECIMEN:  PATHOLOGY  COUNTS:  YES  TOURNIQUET:  * No tourniquets in log *  DICTATION: .Dragon Dictation   After informed consent was obtained the patient was brought to the operating room and placed in the supine position on the operating room table. After adequate induction of general anesthesia the patient's left breast was prepped with ChloraPrep, allowed to dry, and draped in usual sterile manner. An appropriate timeout was performed. The previous upper outer periareolar incision was opened sharply with a 15 blade knife.  The dissection was carried through the skin and subcutaneous tissue sharply with electrocautery until the lumpectomy cavity was identified. The lumpectomy cavity was excised sharply with the electrocautery. Once the specimen was removed it was oriented with the appropriate paint colors. The cavity was then marked with clips. The cavity was then irrigated with copious amounts of saline  And hemostasis was achieved using the Bovie electrocautery. The cavity was then closed with layers of interrupted 3-0 Vicryl stitches. The skin was then closed with interrupted 4-0 Monocryl subcuticular stitches. Dermabond dressings were applied. The patient tolerated the procedure well. At the end of the case all  needle sponge and instrument counts were correct. The patient was then awakened and taken to recovery in stable condition.  PLAN OF CARE: Discharge to home after PACU  PATIENT DISPOSITION:  PACU - hemodynamically stable.   Delay start of Pharmacological VTE agent (>24hrs) due to surgical blood loss or risk of bleeding: not applicable

## 2015-11-23 ENCOUNTER — Encounter (HOSPITAL_BASED_OUTPATIENT_CLINIC_OR_DEPARTMENT_OTHER): Payer: Self-pay | Admitting: General Surgery

## 2015-11-23 NOTE — Addendum Note (Signed)
Addendum  created 11/23/15 0749 by Ernesta Amble Jennalynn Rivard, CRNA   Charge Capture section accepted

## 2015-11-29 ENCOUNTER — Encounter: Payer: Self-pay | Admitting: Hematology and Oncology

## 2015-11-29 ENCOUNTER — Ambulatory Visit (HOSPITAL_BASED_OUTPATIENT_CLINIC_OR_DEPARTMENT_OTHER): Payer: Medicare HMO | Admitting: Hematology and Oncology

## 2015-11-29 DIAGNOSIS — C50212 Malignant neoplasm of upper-inner quadrant of left female breast: Secondary | ICD-10-CM

## 2015-11-29 MED ORDER — ANASTROZOLE 1 MG PO TABS: 1.0000 mg | ORAL_TABLET | Freq: Every day | ORAL | 3 refills | Status: DC

## 2015-11-29 NOTE — Assessment & Plan Note (Signed)
Left lumpectomy 10/07/2015: Invasive lobular cancer grade 1, 0.5 cm, LCIS with calcifications, focally present at superior margin, ER 95% PR 0%, her-2 neg, Ki-67 2% T1 aN0 stage IA pathologic staging Left breast Re-excision 11/22/2015: No residual cancer identified Did not need adjuvant radiation therapy given her advanced age  Current treatment: Anastrozole 1 mg by mouth daily started 10/28/2015 Anastrozole toxicities:  Return to clinic in 6 months for follow-up

## 2015-11-29 NOTE — Progress Notes (Signed)
Patient Care Team: [name], NP as PCP - General (Nurse Practitioner)  SUMMARY OF ONCOLOGIC HISTORY:   Breast cancer of upper-inner quadrant of left female breast ([place])   10/07/2015 Surgery    Left lumpectomy: Invasive lobular cancer grade 1, 0.5 cm, LCIS with calcifications, focally present at superior margin, ER 95% PR 0%, her-2 neg, Ki-67 2% T1 aN0 stage IA pathologic staging      10/29/2015 -  Anti-estrogen oral therapy    Anastrozole 1 mg daily      11/22/2015 Surgery    Left breast excision: No residual cancer identified       CHIEF COMPLIANT: Follow-up after reexcision left breast  INTERVAL HISTORY: Erin [name] is a 82 year old with above-mentioned history left breast invasive lobular cancer who underwent reexcision and is here today to discuss a treatment plan. She reports no major problems or concerns. She is healing very well from the second surgery. She is tolerating anastrozole extremely well. She started anastrozole prior to her second surgery. She denies any hot flashes or myalgias.  REVIEW OF SYSTEMS:   Constitutional: Denies fevers, chills or abnormal weight loss Eyes: Denies blurriness of vision Ears, nose, mouth, throat, and face: Denies mucositis or sore throat Respiratory: Denies cough, dyspnea or wheezes Cardiovascular: Denies palpitation, chest discomfort Gastrointestinal:  Denies nausea, heartburn or change in bowel habits Skin: Denies abnormal skin rashes Lymphatics: Denies new lymphadenopathy or easy bruising Neurological:Denies numbness, tingling or new weaknesses Behavioral/Psych: Mood is stable, no new changes  Extremities: No lower extremity edema Breast: Recent left breast reexcision surgery All other systems were reviewed with the patient and are negative.  I have reviewed the past medical history, past surgical history, social history and family history with the patient and they are unchanged from previous note.  ALLERGIES:  has No  Known Allergies.  MEDICATIONS:  Current Outpatient Prescriptions  Medication Sig Dispense Refill  . anastrozole (ARIMIDEX) 1 MG tablet Take 1 tablet (1 mg total) by mouth daily. 90 tablet 3  . Ascorbic Acid (VITAMIN C) 100 MG tablet Take 100 mg by mouth daily.    Marland Kitchen aspirin 81 MG tablet Take 81 mg by mouth daily.    . benazepril (LOTENSIN) 20 MG tablet     . Calcium Carbonate Antacid (TUMS PO) Take by mouth.    . Calcium-Vitamin D-Vitamin K (VIACTIV PO) Take by mouth.    Marland Kitchen HYDROcodone-acetaminophen (NORCO/VICODIN) 5-325 MG tablet Take 1-2 tablets by mouth every 4 (four) hours as needed for moderate pain or severe pain. 30 tablet 0  . ibuprofen (ADVIL,MOTRIN) 200 MG tablet Take 200 mg by mouth every 6 (six) hours as needed.    Marland Kitchen LUMIGAN 0.01 % SOLN     . Omega-3 Fatty Acids (FISH OIL PO) Take 600 mg by mouth.    . Red Yeast Rice Extract (RED YEAST RICE PO) Take 2 tablets by mouth.    . Triamterene-HCTZ (MAXZIDE PO) Take 75 mg by mouth.    . vitamin E 100 UNIT capsule Take by mouth daily.     No current facility-administered medications for this visit.     PHYSICAL EXAMINATION: ECOG PERFORMANCE STATUS: 1 - Symptomatic but completely ambulatory  Vitals:   11/29/15 1418  BP: (!) 188/69  Pulse: 95  Resp: 17  Temp: 98 F (36.7 C)   Filed Weights    GENERAL:alert, no distress and comfortable SKIN: skin color, texture, turgor are normal, no rashes or significant lesions EYES: normal, Conjunctiva are pink and non-injected,  sclera clear OROPHARYNX:no exudate, no erythema and lips, buccal mucosa, and tongue normal  NECK: supple, thyroid normal size, non-tender, without nodularity LYMPH:  no palpable lymphadenopathy in the cervical, axillary or inguinal LUNGS: clear to auscultation and percussion with normal breathing effort HEART: regular rate & rhythm and no murmurs and no lower extremity edema ABDOMEN:abdomen soft, non-tender and normal bowel sounds MUSCULOSKELETAL:no cyanosis of  digits and no clubbing  NEURO: alert & oriented x 3 with fluent speech, no focal motor/sensory deficits EXTREMITIES: No lower extremity edema  LABORATORY DATA:  I have reviewed the data as listed   Chemistry      Component Value Date/Time   NA 138 11/18/2015 1134   K 4.6 11/18/2015 1134   CL 106 11/18/2015 1134   CO2 22 11/18/2015 1134   BUN 32 (H) 11/18/2015 1134   CREATININE 1.10 (H) 11/18/2015 1134      Component Value Date/Time   CALCIUM 9.8 11/18/2015 1134       No results found for: WBC, HGB, HCT, MCV, PLT, NEUTROABS   ASSESSMENT & PLAN:  Breast cancer of upper-inner quadrant of left female breast ([place]) Left lumpectomy 10/07/2015: Invasive lobular cancer grade 1, 0.5 cm, LCIS with calcifications, focally present at superior margin, ER 95% PR 0%, her-2 neg, Ki-67 2% T1 aN0 stage IA pathologic staging Left breast Re-excision 11/22/2015: No residual cancer identified Did not need adjuvant radiation therapy given her advanced age  Current treatment: Anastrozole 1 mg by mouth daily started 10/28/2015 Anastrozole toxicities:No side effects anastrozole.  Return to clinic in 6 months for follow-up   No orders of the defined types were placed in this encounter.  The patient has a good understanding of the overall plan. she agrees with it. she will call with any problems that may develop before the next visit here.   Rulon Eisenmenger, MD 11/29/15

## 2015-12-02 ENCOUNTER — Other Ambulatory Visit: Payer: Self-pay | Admitting: *Deleted

## 2015-12-02 DIAGNOSIS — C50212 Malignant neoplasm of upper-inner quadrant of left female breast: Secondary | ICD-10-CM

## 2015-12-13 ENCOUNTER — Telehealth: Payer: Self-pay | Admitting: Hematology and Oncology

## 2015-12-13 NOTE — Telephone Encounter (Signed)
lvm to inform pt of survivorship appt Jan 2018 date/time per LOS

## 2015-12-15 MED ORDER — DIPHENHYDRAMINE HCL 50 MG/ML IJ SOLN
INTRAMUSCULAR | Status: AC
  Filled 2015-12-15: qty 1

## 2015-12-15 MED ORDER — FAMOTIDINE IN NACL 20-0.9 MG/50ML-% IV SOLN
INTRAVENOUS | Status: AC
  Filled 2015-12-15: qty 50

## 2015-12-15 MED ORDER — LORAZEPAM 2 MG/ML IJ SOLN
INTRAMUSCULAR | Status: AC
  Filled 2015-12-15: qty 1

## 2016-03-24 ENCOUNTER — Telehealth: Payer: Self-pay | Admitting: Hematology and Oncology

## 2016-03-24 DIAGNOSIS — Z713 Dietary counseling and surveillance: Secondary | ICD-10-CM | POA: Diagnosis not present

## 2016-03-24 DIAGNOSIS — Z6829 Body mass index (BMI) 29.0-29.9, adult: Secondary | ICD-10-CM | POA: Diagnosis not present

## 2016-03-24 DIAGNOSIS — L0390 Cellulitis, unspecified: Secondary | ICD-10-CM | POA: Diagnosis not present

## 2016-03-24 DIAGNOSIS — Z299 Encounter for prophylactic measures, unspecified: Secondary | ICD-10-CM | POA: Diagnosis not present

## 2016-03-24 NOTE — Telephone Encounter (Signed)
Pt called to cxl 03/27/16 appt. Pt did not want to r/s at this time

## 2016-03-27 ENCOUNTER — Encounter: Payer: Medicare HMO | Admitting: Adult Health

## 2016-04-13 ENCOUNTER — Telehealth: Payer: Self-pay | Admitting: Adult Health

## 2016-04-13 NOTE — Telephone Encounter (Signed)
The Survivorship Care Plan was mailed to Erin [name] as she reported not being able to come in to the Survivorship Clinic for an in-person visit at this time. A letter was mailed to her outlining the purpose of the content of the care plan, as well as encouraging her to reach out to me with any questions or concerns.  .  Additional resources regarding healthy eating, recommendations for exercise, [employer] program, and brochures for support services were also included in the mailed packet.  A shorter version of the care plan was also routed/faxed/mailed to [name], NP, the patient's PCP.  I will not be placing any follow-up appointments to the Survivorship Clinic for Erin [name], but I am happy to see her at any time in the future for any survivorship concerns that may arise. Thank you for allowing me to participate in her care!  Charlestine Massed, NP [place] [phone]

## 2016-05-22 ENCOUNTER — Telehealth: Payer: Self-pay | Admitting: Hematology and Oncology

## 2016-05-22 NOTE — Telephone Encounter (Signed)
Appointment for the 3/19 is canceled and needs to be rescheduled

## 2016-05-23 NOTE — Telephone Encounter (Signed)
Needs to reschedule for May

## 2016-05-29 ENCOUNTER — Ambulatory Visit: Payer: Medicare HMO | Admitting: Hematology and Oncology

## 2016-06-03 ENCOUNTER — Other Ambulatory Visit: Payer: Self-pay | Admitting: Hematology and Oncology

## 2016-06-14 DIAGNOSIS — Z6829 Body mass index (BMI) 29.0-29.9, adult: Secondary | ICD-10-CM | POA: Diagnosis not present

## 2016-06-14 DIAGNOSIS — S81802S Unspecified open wound, left lower leg, sequela: Secondary | ICD-10-CM | POA: Diagnosis not present

## 2016-06-14 DIAGNOSIS — Z299 Encounter for prophylactic measures, unspecified: Secondary | ICD-10-CM | POA: Diagnosis not present

## 2016-06-14 DIAGNOSIS — I10 Essential (primary) hypertension: Secondary | ICD-10-CM | POA: Diagnosis not present

## 2016-06-14 DIAGNOSIS — R609 Edema, unspecified: Secondary | ICD-10-CM | POA: Diagnosis not present

## 2016-06-16 DIAGNOSIS — S81812D Laceration without foreign body, left lower leg, subsequent encounter: Secondary | ICD-10-CM | POA: Diagnosis not present

## 2016-06-16 DIAGNOSIS — R531 Weakness: Secondary | ICD-10-CM | POA: Diagnosis not present

## 2016-06-16 DIAGNOSIS — I8390 Asymptomatic varicose veins of unspecified lower extremity: Secondary | ICD-10-CM | POA: Diagnosis not present

## 2016-06-16 DIAGNOSIS — N189 Chronic kidney disease, unspecified: Secondary | ICD-10-CM | POA: Diagnosis not present

## 2016-06-16 DIAGNOSIS — R262 Difficulty in walking, not elsewhere classified: Secondary | ICD-10-CM | POA: Diagnosis not present

## 2016-06-16 DIAGNOSIS — I129 Hypertensive chronic kidney disease with stage 1 through stage 4 chronic kidney disease, or unspecified chronic kidney disease: Secondary | ICD-10-CM | POA: Diagnosis not present

## 2016-06-16 DIAGNOSIS — S81802D Unspecified open wound, left lower leg, subsequent encounter: Secondary | ICD-10-CM | POA: Diagnosis not present

## 2016-07-24 ENCOUNTER — Ambulatory Visit (HOSPITAL_BASED_OUTPATIENT_CLINIC_OR_DEPARTMENT_OTHER): Payer: Medicare HMO | Admitting: Hematology and Oncology

## 2016-07-24 ENCOUNTER — Encounter: Payer: Self-pay | Admitting: Hematology and Oncology

## 2016-07-24 DIAGNOSIS — Z170 Estrogen receptor positive status [ER+]: Secondary | ICD-10-CM | POA: Diagnosis not present

## 2016-07-24 DIAGNOSIS — C50212 Malignant neoplasm of upper-inner quadrant of left female breast: Secondary | ICD-10-CM

## 2016-07-24 MED ORDER — ANASTROZOLE 1 MG PO TABS: 1.0000 mg | ORAL_TABLET | Freq: Every day | ORAL | 3 refills | Status: DC

## 2016-07-24 NOTE — Assessment & Plan Note (Signed)
Left lumpectomy 10/07/2015: Invasive lobular cancer grade 1, 0.5 cm, LCIS with calcifications, focally present at superior margin, ER 95% PR 0%, her-2 neg, Ki-67 2% T1 aN0 stage IA pathologic staging Left breast Re-excision 11/22/2015: No residual cancer identified Did not need adjuvant radiation therapy given her advanced age  Current treatment: Anastrozole 1 mg by mouth daily started 10/28/2015 Anastrozole toxicities:No side effects anastrozole. Survivorship: Discussed importance of regular exercise as well as healthy diet. Return to clinic in 1 year for follow-up

## 2016-07-24 NOTE — Progress Notes (Signed)
Patient Care Team: [name], NP as PCP - General (Nurse Practitioner) Jovita Kussmaul, MD as Consulting Physician (General Surgery) Nicholas Lose, MD as Consulting Physician (Hematology and Oncology) Delice Bison Charlestine Massed, NP as Nurse Practitioner (Hematology and Oncology)  DIAGNOSIS:  Encounter Diagnosis  Name Primary?  . Malignant neoplasm of upper-inner quadrant of left breast in female, estrogen receptor positive ([place])     SUMMARY OF ONCOLOGIC HISTORY:   Breast cancer of upper-inner quadrant of left female breast ([place])   08/06/2015 Initial Biopsy    (L) breast needle biopsy (UOQ): [place] with fibrocystic changes and associated microcalcs.       10/07/2015 Surgery    Left lumpectomy Marlou Starks): Invasive lobular cancer grade 1, 0.5 cm, LCIS with calcifications, focally present at superior margin, ER 95% PR 0%, her-2 neg, Ki-67 2% T1 aN0 stage IA pathologic staging       Radiation Therapy    No adjuvant radiation given advanced age.       10/29/2015 -  Anti-estrogen oral therapy    Anastrozole 1 mg daily      11/22/2015 Surgery    Left breast re-excision Marlou Starks): No residual cancer identified, margins negative.        CHIEF COMPLIANT: Follow-up on anastrozole therapy  INTERVAL HISTORY: Erin [name] is a 83 year old with above-mentioned history of left breast cancer treated with lumpectomy. She was not given radiation because of her age. She was started on oral antiestrogen therapy with anastrozole and 10/29/2015. She denies any hot flashes or myalgias. Denies any lumps or nodules in the breasts.  REVIEW OF SYSTEMS:   Constitutional: Denies fevers, chills or abnormal weight loss Eyes: Denies blurriness of vision Ears, nose, mouth, throat, and face: Denies mucositis or sore throat Respiratory: Denies cough, dyspnea or wheezes Cardiovascular: Denies palpitation, chest discomfort Gastrointestinal:  Denies nausea, heartburn or change in bowel habits Skin: Denies  abnormal skin rashes Lymphatics: Denies new lymphadenopathy or easy bruising Neurological:Denies numbness, tingling or new weaknesses Behavioral/Psych: Mood is stable, no new changes  Extremities: No lower extremity edema Breast:  denies any pain or lumps or nodules in either breasts All other systems were reviewed with the patient and are negative.  I have reviewed the past medical history, past surgical history, social history and family history with the patient and they are unchanged from previous note.  ALLERGIES:  has No Known Allergies.  MEDICATIONS:  Current Outpatient Prescriptions  Medication Sig Dispense Refill  . anastrozole (ARIMIDEX) 1 MG tablet TAKE ONE TABLET BY MOUTH DAILY 90 tablet 3  . Ascorbic Acid (VITAMIN C) 100 MG tablet Take 100 mg by mouth daily.    Marland Kitchen aspirin 81 MG tablet Take 81 mg by mouth daily.    . benazepril (LOTENSIN) 20 MG tablet     . Calcium Carbonate Antacid (TUMS PO) Take by mouth.    . Calcium-Vitamin D-Vitamin K (VIACTIV PO) Take by mouth.    Marland Kitchen HYDROcodone-acetaminophen (NORCO/VICODIN) 5-325 MG tablet Take 1-2 tablets by mouth every 4 (four) hours as needed for moderate pain or severe pain. 30 tablet 0  . ibuprofen (ADVIL,MOTRIN) 200 MG tablet Take 200 mg by mouth every 6 (six) hours as needed.    Marland Kitchen LUMIGAN 0.01 % SOLN     . Omega-3 Fatty Acids (FISH OIL PO) Take 600 mg by mouth.    . Red Yeast Rice Extract (RED YEAST RICE PO) Take 2 tablets by mouth.    . Triamterene-HCTZ (MAXZIDE PO) Take 75 mg by mouth.    Marland Kitchen  vitamin E 100 UNIT capsule Take by mouth daily.     No current facility-administered medications for this visit.     PHYSICAL EXAMINATION: ECOG PERFORMANCE STATUS: 1 - Symptomatic but completely ambulatory  Vitals:   07/24/16 1432  BP: (!) 180/61  Pulse: 84  Resp: 18  Temp: 97.7 F (36.5 C)   Filed Weights    GENERAL:alert, no distress and comfortable SKIN: skin color, texture, turgor are normal, no rashes or significant  lesions EYES: normal, Conjunctiva are pink and non-injected, sclera clear OROPHARYNX:no exudate, no erythema and lips, buccal mucosa, and tongue normal  NECK: supple, thyroid normal size, non-tender, without nodularity LYMPH:  no palpable lymphadenopathy in the cervical, axillary or inguinal LUNGS: clear to auscultation and percussion with normal breathing effort HEART: regular rate & rhythm and no murmurs and no lower extremity edema ABDOMEN:abdomen soft, non-tender and normal bowel sounds MUSCULOSKELETAL:no cyanosis of digits and no clubbing  NEURO: alert & oriented x 3 with fluent speech, no focal motor/sensory deficits EXTREMITIES: No lower extremity edema BREAST: No palpable masses or nodules in either right or left breasts. No palpable axillary supraclavicular or infraclavicular adenopathy no breast tenderness or nipple discharge. (exam performed in the presence of a chaperone)  LABORATORY DATA:  I have reviewed the data as listed   Chemistry      Component Value Date/Time   NA 138 11/18/2015 1134   K 4.6 11/18/2015 1134   CL 106 11/18/2015 1134   CO2 22 11/18/2015 1134   BUN 32 (H) 11/18/2015 1134   CREATININE 1.10 (H) 11/18/2015 1134      Component Value Date/Time   CALCIUM 9.8 11/18/2015 1134      ASSESSMENT & PLAN:  Breast cancer of upper-inner quadrant of left female breast ([place]) Left lumpectomy 10/07/2015: Invasive lobular cancer grade 1, 0.5 cm, LCIS with calcifications, focally present at superior margin, ER 95% PR 0%, her-2 neg, Ki-67 2% T1 aN0 stage IA pathologic staging Left breast Re-excision 11/22/2015: No residual cancer identified Did not need adjuvant radiation therapy given her advanced age  Current treatment: Anastrozole 1 mg by mouth daily started 10/28/2015 Anastrozole toxicities:No side effects anastrozole. Survivorship: Discussed importance of regular exercise as well as healthy diet. Return to clinic in 1 year for follow-up  I spent 25 minutes  talking to the patient of which more than half was spent in counseling and coordination of care.  No orders of the defined types were placed in this encounter.  The patient has a good understanding of the overall plan. she agrees with it. she will call with any problems that may develop before the next visit here.   Rulon Eisenmenger, MD 07/24/16

## 2016-07-25 DIAGNOSIS — H401133 Primary open-angle glaucoma, bilateral, severe stage: Secondary | ICD-10-CM | POA: Diagnosis not present

## 2016-08-15 DIAGNOSIS — R531 Weakness: Secondary | ICD-10-CM | POA: Diagnosis not present

## 2016-08-15 DIAGNOSIS — S81812D Laceration without foreign body, left lower leg, subsequent encounter: Secondary | ICD-10-CM | POA: Diagnosis not present

## 2016-08-15 DIAGNOSIS — I129 Hypertensive chronic kidney disease with stage 1 through stage 4 chronic kidney disease, or unspecified chronic kidney disease: Secondary | ICD-10-CM | POA: Diagnosis not present

## 2016-08-15 DIAGNOSIS — I8390 Asymptomatic varicose veins of unspecified lower extremity: Secondary | ICD-10-CM | POA: Diagnosis not present

## 2016-08-15 DIAGNOSIS — R262 Difficulty in walking, not elsewhere classified: Secondary | ICD-10-CM | POA: Diagnosis not present

## 2016-08-15 DIAGNOSIS — N189 Chronic kidney disease, unspecified: Secondary | ICD-10-CM | POA: Diagnosis not present

## 2016-08-16 ENCOUNTER — Other Ambulatory Visit: Payer: Self-pay | Admitting: Hematology and Oncology

## 2016-08-16 ENCOUNTER — Ambulatory Visit
Admission: RE | Admit: 2016-08-16 | Discharge: 2016-08-16 | Disposition: A | Discharge status: Home / Self Care | Payer: Medicare HMO | Source: Ambulatory Visit | Attending: Hematology and Oncology | Admitting: Hematology and Oncology

## 2016-08-16 DIAGNOSIS — R928 Other abnormal and inconclusive findings on diagnostic imaging of breast: Secondary | ICD-10-CM | POA: Diagnosis not present

## 2016-08-16 DIAGNOSIS — I129 Hypertensive chronic kidney disease with stage 1 through stage 4 chronic kidney disease, or unspecified chronic kidney disease: Secondary | ICD-10-CM | POA: Diagnosis not present

## 2016-08-16 DIAGNOSIS — C50212 Malignant neoplasm of upper-inner quadrant of left female breast: Secondary | ICD-10-CM

## 2016-08-16 DIAGNOSIS — Z09 Encounter for follow-up examination after completed treatment for conditions other than malignant neoplasm: Secondary | ICD-10-CM

## 2016-08-16 DIAGNOSIS — Z170 Estrogen receptor positive status [ER+]: Principal | ICD-10-CM

## 2016-08-16 DIAGNOSIS — N189 Chronic kidney disease, unspecified: Secondary | ICD-10-CM | POA: Diagnosis not present

## 2016-08-16 DIAGNOSIS — R262 Difficulty in walking, not elsewhere classified: Secondary | ICD-10-CM | POA: Diagnosis not present

## 2016-08-16 DIAGNOSIS — S81812D Laceration without foreign body, left lower leg, subsequent encounter: Secondary | ICD-10-CM | POA: Diagnosis not present

## 2016-08-16 DIAGNOSIS — I8390 Asymptomatic varicose veins of unspecified lower extremity: Secondary | ICD-10-CM | POA: Diagnosis not present

## 2016-08-16 DIAGNOSIS — R531 Weakness: Secondary | ICD-10-CM | POA: Diagnosis not present

## 2016-08-18 DIAGNOSIS — S81812D Laceration without foreign body, left lower leg, subsequent encounter: Secondary | ICD-10-CM | POA: Diagnosis not present

## 2016-08-18 DIAGNOSIS — R531 Weakness: Secondary | ICD-10-CM | POA: Diagnosis not present

## 2016-08-18 DIAGNOSIS — R262 Difficulty in walking, not elsewhere classified: Secondary | ICD-10-CM | POA: Diagnosis not present

## 2016-08-18 DIAGNOSIS — I8390 Asymptomatic varicose veins of unspecified lower extremity: Secondary | ICD-10-CM | POA: Diagnosis not present

## 2016-08-18 DIAGNOSIS — I129 Hypertensive chronic kidney disease with stage 1 through stage 4 chronic kidney disease, or unspecified chronic kidney disease: Secondary | ICD-10-CM | POA: Diagnosis not present

## 2016-08-18 DIAGNOSIS — N189 Chronic kidney disease, unspecified: Secondary | ICD-10-CM | POA: Diagnosis not present

## 2016-08-21 ENCOUNTER — Other Ambulatory Visit: Payer: Self-pay | Admitting: Hematology and Oncology

## 2016-08-21 DIAGNOSIS — I8390 Asymptomatic varicose veins of unspecified lower extremity: Secondary | ICD-10-CM | POA: Diagnosis not present

## 2016-08-21 DIAGNOSIS — R262 Difficulty in walking, not elsewhere classified: Secondary | ICD-10-CM | POA: Diagnosis not present

## 2016-08-21 DIAGNOSIS — N189 Chronic kidney disease, unspecified: Secondary | ICD-10-CM | POA: Diagnosis not present

## 2016-08-21 DIAGNOSIS — S81812D Laceration without foreign body, left lower leg, subsequent encounter: Secondary | ICD-10-CM | POA: Diagnosis not present

## 2016-08-21 DIAGNOSIS — R921 Mammographic calcification found on diagnostic imaging of breast: Secondary | ICD-10-CM

## 2016-08-21 DIAGNOSIS — R531 Weakness: Secondary | ICD-10-CM | POA: Diagnosis not present

## 2016-08-21 DIAGNOSIS — I129 Hypertensive chronic kidney disease with stage 1 through stage 4 chronic kidney disease, or unspecified chronic kidney disease: Secondary | ICD-10-CM | POA: Diagnosis not present

## 2016-08-23 DIAGNOSIS — I8390 Asymptomatic varicose veins of unspecified lower extremity: Secondary | ICD-10-CM | POA: Diagnosis not present

## 2016-08-23 DIAGNOSIS — R531 Weakness: Secondary | ICD-10-CM | POA: Diagnosis not present

## 2016-08-23 DIAGNOSIS — R262 Difficulty in walking, not elsewhere classified: Secondary | ICD-10-CM | POA: Diagnosis not present

## 2016-08-23 DIAGNOSIS — I129 Hypertensive chronic kidney disease with stage 1 through stage 4 chronic kidney disease, or unspecified chronic kidney disease: Secondary | ICD-10-CM | POA: Diagnosis not present

## 2016-08-23 DIAGNOSIS — S81812D Laceration without foreign body, left lower leg, subsequent encounter: Secondary | ICD-10-CM | POA: Diagnosis not present

## 2016-08-23 DIAGNOSIS — N189 Chronic kidney disease, unspecified: Secondary | ICD-10-CM | POA: Diagnosis not present

## 2016-08-25 DIAGNOSIS — I8390 Asymptomatic varicose veins of unspecified lower extremity: Secondary | ICD-10-CM | POA: Diagnosis not present

## 2016-08-25 DIAGNOSIS — R531 Weakness: Secondary | ICD-10-CM | POA: Diagnosis not present

## 2016-08-25 DIAGNOSIS — I129 Hypertensive chronic kidney disease with stage 1 through stage 4 chronic kidney disease, or unspecified chronic kidney disease: Secondary | ICD-10-CM | POA: Diagnosis not present

## 2016-08-25 DIAGNOSIS — N189 Chronic kidney disease, unspecified: Secondary | ICD-10-CM | POA: Diagnosis not present

## 2016-08-25 DIAGNOSIS — R262 Difficulty in walking, not elsewhere classified: Secondary | ICD-10-CM | POA: Diagnosis not present

## 2016-08-25 DIAGNOSIS — S81812D Laceration without foreign body, left lower leg, subsequent encounter: Secondary | ICD-10-CM | POA: Diagnosis not present

## 2016-08-28 DIAGNOSIS — R531 Weakness: Secondary | ICD-10-CM | POA: Diagnosis not present

## 2016-08-28 DIAGNOSIS — N189 Chronic kidney disease, unspecified: Secondary | ICD-10-CM | POA: Diagnosis not present

## 2016-08-28 DIAGNOSIS — I8390 Asymptomatic varicose veins of unspecified lower extremity: Secondary | ICD-10-CM | POA: Diagnosis not present

## 2016-08-28 DIAGNOSIS — I129 Hypertensive chronic kidney disease with stage 1 through stage 4 chronic kidney disease, or unspecified chronic kidney disease: Secondary | ICD-10-CM | POA: Diagnosis not present

## 2016-08-28 DIAGNOSIS — R262 Difficulty in walking, not elsewhere classified: Secondary | ICD-10-CM | POA: Diagnosis not present

## 2016-08-28 DIAGNOSIS — S81812D Laceration without foreign body, left lower leg, subsequent encounter: Secondary | ICD-10-CM | POA: Diagnosis not present

## 2016-08-30 DIAGNOSIS — S81812D Laceration without foreign body, left lower leg, subsequent encounter: Secondary | ICD-10-CM | POA: Diagnosis not present

## 2016-08-30 DIAGNOSIS — I8390 Asymptomatic varicose veins of unspecified lower extremity: Secondary | ICD-10-CM | POA: Diagnosis not present

## 2016-08-30 DIAGNOSIS — R531 Weakness: Secondary | ICD-10-CM | POA: Diagnosis not present

## 2016-08-30 DIAGNOSIS — R262 Difficulty in walking, not elsewhere classified: Secondary | ICD-10-CM | POA: Diagnosis not present

## 2016-08-30 DIAGNOSIS — I129 Hypertensive chronic kidney disease with stage 1 through stage 4 chronic kidney disease, or unspecified chronic kidney disease: Secondary | ICD-10-CM | POA: Diagnosis not present

## 2016-08-30 DIAGNOSIS — N189 Chronic kidney disease, unspecified: Secondary | ICD-10-CM | POA: Diagnosis not present

## 2016-09-01 DIAGNOSIS — R262 Difficulty in walking, not elsewhere classified: Secondary | ICD-10-CM | POA: Diagnosis not present

## 2016-09-01 DIAGNOSIS — S81812D Laceration without foreign body, left lower leg, subsequent encounter: Secondary | ICD-10-CM | POA: Diagnosis not present

## 2016-09-01 DIAGNOSIS — R531 Weakness: Secondary | ICD-10-CM | POA: Diagnosis not present

## 2016-09-01 DIAGNOSIS — N189 Chronic kidney disease, unspecified: Secondary | ICD-10-CM | POA: Diagnosis not present

## 2016-09-01 DIAGNOSIS — I129 Hypertensive chronic kidney disease with stage 1 through stage 4 chronic kidney disease, or unspecified chronic kidney disease: Secondary | ICD-10-CM | POA: Diagnosis not present

## 2016-09-01 DIAGNOSIS — I8390 Asymptomatic varicose veins of unspecified lower extremity: Secondary | ICD-10-CM | POA: Diagnosis not present

## 2016-09-04 DIAGNOSIS — R531 Weakness: Secondary | ICD-10-CM | POA: Diagnosis not present

## 2016-09-04 DIAGNOSIS — S81812D Laceration without foreign body, left lower leg, subsequent encounter: Secondary | ICD-10-CM | POA: Diagnosis not present

## 2016-09-04 DIAGNOSIS — I8390 Asymptomatic varicose veins of unspecified lower extremity: Secondary | ICD-10-CM | POA: Diagnosis not present

## 2016-09-04 DIAGNOSIS — N189 Chronic kidney disease, unspecified: Secondary | ICD-10-CM | POA: Diagnosis not present

## 2016-09-04 DIAGNOSIS — R262 Difficulty in walking, not elsewhere classified: Secondary | ICD-10-CM | POA: Diagnosis not present

## 2016-09-04 DIAGNOSIS — I129 Hypertensive chronic kidney disease with stage 1 through stage 4 chronic kidney disease, or unspecified chronic kidney disease: Secondary | ICD-10-CM | POA: Diagnosis not present

## 2016-09-06 DIAGNOSIS — R531 Weakness: Secondary | ICD-10-CM | POA: Diagnosis not present

## 2016-09-06 DIAGNOSIS — N189 Chronic kidney disease, unspecified: Secondary | ICD-10-CM | POA: Diagnosis not present

## 2016-09-06 DIAGNOSIS — S81812D Laceration without foreign body, left lower leg, subsequent encounter: Secondary | ICD-10-CM | POA: Diagnosis not present

## 2016-09-06 DIAGNOSIS — I8390 Asymptomatic varicose veins of unspecified lower extremity: Secondary | ICD-10-CM | POA: Diagnosis not present

## 2016-09-06 DIAGNOSIS — I129 Hypertensive chronic kidney disease with stage 1 through stage 4 chronic kidney disease, or unspecified chronic kidney disease: Secondary | ICD-10-CM | POA: Diagnosis not present

## 2016-09-06 DIAGNOSIS — R262 Difficulty in walking, not elsewhere classified: Secondary | ICD-10-CM | POA: Diagnosis not present

## 2016-09-08 DIAGNOSIS — R531 Weakness: Secondary | ICD-10-CM | POA: Diagnosis not present

## 2016-09-08 DIAGNOSIS — I129 Hypertensive chronic kidney disease with stage 1 through stage 4 chronic kidney disease, or unspecified chronic kidney disease: Secondary | ICD-10-CM | POA: Diagnosis not present

## 2016-09-08 DIAGNOSIS — R262 Difficulty in walking, not elsewhere classified: Secondary | ICD-10-CM | POA: Diagnosis not present

## 2016-09-08 DIAGNOSIS — I8390 Asymptomatic varicose veins of unspecified lower extremity: Secondary | ICD-10-CM | POA: Diagnosis not present

## 2016-09-08 DIAGNOSIS — N189 Chronic kidney disease, unspecified: Secondary | ICD-10-CM | POA: Diagnosis not present

## 2016-09-08 DIAGNOSIS — S81812D Laceration without foreign body, left lower leg, subsequent encounter: Secondary | ICD-10-CM | POA: Diagnosis not present

## 2016-09-11 DIAGNOSIS — I129 Hypertensive chronic kidney disease with stage 1 through stage 4 chronic kidney disease, or unspecified chronic kidney disease: Secondary | ICD-10-CM | POA: Diagnosis not present

## 2016-09-11 DIAGNOSIS — I8390 Asymptomatic varicose veins of unspecified lower extremity: Secondary | ICD-10-CM | POA: Diagnosis not present

## 2016-09-11 DIAGNOSIS — R262 Difficulty in walking, not elsewhere classified: Secondary | ICD-10-CM | POA: Diagnosis not present

## 2016-09-11 DIAGNOSIS — S81812D Laceration without foreign body, left lower leg, subsequent encounter: Secondary | ICD-10-CM | POA: Diagnosis not present

## 2016-09-11 DIAGNOSIS — R531 Weakness: Secondary | ICD-10-CM | POA: Diagnosis not present

## 2016-09-11 DIAGNOSIS — N189 Chronic kidney disease, unspecified: Secondary | ICD-10-CM | POA: Diagnosis not present

## 2016-09-13 DIAGNOSIS — N189 Chronic kidney disease, unspecified: Secondary | ICD-10-CM | POA: Diagnosis not present

## 2016-09-13 DIAGNOSIS — S81812D Laceration without foreign body, left lower leg, subsequent encounter: Secondary | ICD-10-CM | POA: Diagnosis not present

## 2016-09-13 DIAGNOSIS — R262 Difficulty in walking, not elsewhere classified: Secondary | ICD-10-CM | POA: Diagnosis not present

## 2016-09-13 DIAGNOSIS — R531 Weakness: Secondary | ICD-10-CM | POA: Diagnosis not present

## 2016-09-13 DIAGNOSIS — I8390 Asymptomatic varicose veins of unspecified lower extremity: Secondary | ICD-10-CM | POA: Diagnosis not present

## 2016-09-13 DIAGNOSIS — I129 Hypertensive chronic kidney disease with stage 1 through stage 4 chronic kidney disease, or unspecified chronic kidney disease: Secondary | ICD-10-CM | POA: Diagnosis not present

## 2016-09-15 DIAGNOSIS — R531 Weakness: Secondary | ICD-10-CM | POA: Diagnosis not present

## 2016-09-15 DIAGNOSIS — I8390 Asymptomatic varicose veins of unspecified lower extremity: Secondary | ICD-10-CM | POA: Diagnosis not present

## 2016-09-15 DIAGNOSIS — N189 Chronic kidney disease, unspecified: Secondary | ICD-10-CM | POA: Diagnosis not present

## 2016-09-15 DIAGNOSIS — S81812D Laceration without foreign body, left lower leg, subsequent encounter: Secondary | ICD-10-CM | POA: Diagnosis not present

## 2016-09-15 DIAGNOSIS — R262 Difficulty in walking, not elsewhere classified: Secondary | ICD-10-CM | POA: Diagnosis not present

## 2016-09-15 DIAGNOSIS — I129 Hypertensive chronic kidney disease with stage 1 through stage 4 chronic kidney disease, or unspecified chronic kidney disease: Secondary | ICD-10-CM | POA: Diagnosis not present

## 2016-09-18 DIAGNOSIS — N189 Chronic kidney disease, unspecified: Secondary | ICD-10-CM | POA: Diagnosis not present

## 2016-09-18 DIAGNOSIS — S81812D Laceration without foreign body, left lower leg, subsequent encounter: Secondary | ICD-10-CM | POA: Diagnosis not present

## 2016-09-18 DIAGNOSIS — I129 Hypertensive chronic kidney disease with stage 1 through stage 4 chronic kidney disease, or unspecified chronic kidney disease: Secondary | ICD-10-CM | POA: Diagnosis not present

## 2016-09-18 DIAGNOSIS — R531 Weakness: Secondary | ICD-10-CM | POA: Diagnosis not present

## 2016-09-18 DIAGNOSIS — R262 Difficulty in walking, not elsewhere classified: Secondary | ICD-10-CM | POA: Diagnosis not present

## 2016-09-18 DIAGNOSIS — I8390 Asymptomatic varicose veins of unspecified lower extremity: Secondary | ICD-10-CM | POA: Diagnosis not present

## 2016-09-20 DIAGNOSIS — N189 Chronic kidney disease, unspecified: Secondary | ICD-10-CM | POA: Diagnosis not present

## 2016-09-20 DIAGNOSIS — R262 Difficulty in walking, not elsewhere classified: Secondary | ICD-10-CM | POA: Diagnosis not present

## 2016-09-20 DIAGNOSIS — S81812D Laceration without foreign body, left lower leg, subsequent encounter: Secondary | ICD-10-CM | POA: Diagnosis not present

## 2016-09-20 DIAGNOSIS — I8390 Asymptomatic varicose veins of unspecified lower extremity: Secondary | ICD-10-CM | POA: Diagnosis not present

## 2016-09-20 DIAGNOSIS — I129 Hypertensive chronic kidney disease with stage 1 through stage 4 chronic kidney disease, or unspecified chronic kidney disease: Secondary | ICD-10-CM | POA: Diagnosis not present

## 2016-09-20 DIAGNOSIS — R531 Weakness: Secondary | ICD-10-CM | POA: Diagnosis not present

## 2016-09-22 DIAGNOSIS — R262 Difficulty in walking, not elsewhere classified: Secondary | ICD-10-CM | POA: Diagnosis not present

## 2016-09-22 DIAGNOSIS — S81812D Laceration without foreign body, left lower leg, subsequent encounter: Secondary | ICD-10-CM | POA: Diagnosis not present

## 2016-09-22 DIAGNOSIS — I129 Hypertensive chronic kidney disease with stage 1 through stage 4 chronic kidney disease, or unspecified chronic kidney disease: Secondary | ICD-10-CM | POA: Diagnosis not present

## 2016-09-22 DIAGNOSIS — N189 Chronic kidney disease, unspecified: Secondary | ICD-10-CM | POA: Diagnosis not present

## 2016-09-22 DIAGNOSIS — I8390 Asymptomatic varicose veins of unspecified lower extremity: Secondary | ICD-10-CM | POA: Diagnosis not present

## 2016-09-22 DIAGNOSIS — R531 Weakness: Secondary | ICD-10-CM | POA: Diagnosis not present

## 2016-09-25 DIAGNOSIS — R531 Weakness: Secondary | ICD-10-CM | POA: Diagnosis not present

## 2016-09-25 DIAGNOSIS — N189 Chronic kidney disease, unspecified: Secondary | ICD-10-CM | POA: Diagnosis not present

## 2016-09-25 DIAGNOSIS — I8390 Asymptomatic varicose veins of unspecified lower extremity: Secondary | ICD-10-CM | POA: Diagnosis not present

## 2016-09-25 DIAGNOSIS — R262 Difficulty in walking, not elsewhere classified: Secondary | ICD-10-CM | POA: Diagnosis not present

## 2016-09-25 DIAGNOSIS — I129 Hypertensive chronic kidney disease with stage 1 through stage 4 chronic kidney disease, or unspecified chronic kidney disease: Secondary | ICD-10-CM | POA: Diagnosis not present

## 2016-09-25 DIAGNOSIS — S81812D Laceration without foreign body, left lower leg, subsequent encounter: Secondary | ICD-10-CM | POA: Diagnosis not present

## 2016-09-27 DIAGNOSIS — I8390 Asymptomatic varicose veins of unspecified lower extremity: Secondary | ICD-10-CM | POA: Diagnosis not present

## 2016-09-27 DIAGNOSIS — S81812D Laceration without foreign body, left lower leg, subsequent encounter: Secondary | ICD-10-CM | POA: Diagnosis not present

## 2016-09-27 DIAGNOSIS — N189 Chronic kidney disease, unspecified: Secondary | ICD-10-CM | POA: Diagnosis not present

## 2016-09-27 DIAGNOSIS — I129 Hypertensive chronic kidney disease with stage 1 through stage 4 chronic kidney disease, or unspecified chronic kidney disease: Secondary | ICD-10-CM | POA: Diagnosis not present

## 2016-09-27 DIAGNOSIS — R531 Weakness: Secondary | ICD-10-CM | POA: Diagnosis not present

## 2016-09-27 DIAGNOSIS — R262 Difficulty in walking, not elsewhere classified: Secondary | ICD-10-CM | POA: Diagnosis not present

## 2016-09-29 DIAGNOSIS — R531 Weakness: Secondary | ICD-10-CM | POA: Diagnosis not present

## 2016-09-29 DIAGNOSIS — I129 Hypertensive chronic kidney disease with stage 1 through stage 4 chronic kidney disease, or unspecified chronic kidney disease: Secondary | ICD-10-CM | POA: Diagnosis not present

## 2016-09-29 DIAGNOSIS — R262 Difficulty in walking, not elsewhere classified: Secondary | ICD-10-CM | POA: Diagnosis not present

## 2016-09-29 DIAGNOSIS — S81812D Laceration without foreign body, left lower leg, subsequent encounter: Secondary | ICD-10-CM | POA: Diagnosis not present

## 2016-09-29 DIAGNOSIS — I8390 Asymptomatic varicose veins of unspecified lower extremity: Secondary | ICD-10-CM | POA: Diagnosis not present

## 2016-09-29 DIAGNOSIS — N189 Chronic kidney disease, unspecified: Secondary | ICD-10-CM | POA: Diagnosis not present

## 2016-10-02 DIAGNOSIS — R531 Weakness: Secondary | ICD-10-CM | POA: Diagnosis not present

## 2016-10-02 DIAGNOSIS — I8390 Asymptomatic varicose veins of unspecified lower extremity: Secondary | ICD-10-CM | POA: Diagnosis not present

## 2016-10-02 DIAGNOSIS — S81812D Laceration without foreign body, left lower leg, subsequent encounter: Secondary | ICD-10-CM | POA: Diagnosis not present

## 2016-10-02 DIAGNOSIS — R262 Difficulty in walking, not elsewhere classified: Secondary | ICD-10-CM | POA: Diagnosis not present

## 2016-10-02 DIAGNOSIS — N189 Chronic kidney disease, unspecified: Secondary | ICD-10-CM | POA: Diagnosis not present

## 2016-10-02 DIAGNOSIS — I129 Hypertensive chronic kidney disease with stage 1 through stage 4 chronic kidney disease, or unspecified chronic kidney disease: Secondary | ICD-10-CM | POA: Diagnosis not present

## 2016-10-03 DIAGNOSIS — S81812D Laceration without foreign body, left lower leg, subsequent encounter: Secondary | ICD-10-CM | POA: Diagnosis not present

## 2016-10-03 DIAGNOSIS — I129 Hypertensive chronic kidney disease with stage 1 through stage 4 chronic kidney disease, or unspecified chronic kidney disease: Secondary | ICD-10-CM | POA: Diagnosis not present

## 2016-10-03 DIAGNOSIS — R262 Difficulty in walking, not elsewhere classified: Secondary | ICD-10-CM | POA: Diagnosis not present

## 2016-10-03 DIAGNOSIS — N189 Chronic kidney disease, unspecified: Secondary | ICD-10-CM | POA: Diagnosis not present

## 2016-10-03 DIAGNOSIS — R531 Weakness: Secondary | ICD-10-CM | POA: Diagnosis not present

## 2016-10-03 DIAGNOSIS — I8390 Asymptomatic varicose veins of unspecified lower extremity: Secondary | ICD-10-CM | POA: Diagnosis not present

## 2016-10-04 DIAGNOSIS — N189 Chronic kidney disease, unspecified: Secondary | ICD-10-CM | POA: Diagnosis not present

## 2016-10-04 DIAGNOSIS — R531 Weakness: Secondary | ICD-10-CM | POA: Diagnosis not present

## 2016-10-04 DIAGNOSIS — S81812D Laceration without foreign body, left lower leg, subsequent encounter: Secondary | ICD-10-CM | POA: Diagnosis not present

## 2016-10-04 DIAGNOSIS — R262 Difficulty in walking, not elsewhere classified: Secondary | ICD-10-CM | POA: Diagnosis not present

## 2016-10-04 DIAGNOSIS — I129 Hypertensive chronic kidney disease with stage 1 through stage 4 chronic kidney disease, or unspecified chronic kidney disease: Secondary | ICD-10-CM | POA: Diagnosis not present

## 2016-10-04 DIAGNOSIS — I8390 Asymptomatic varicose veins of unspecified lower extremity: Secondary | ICD-10-CM | POA: Diagnosis not present

## 2016-10-06 DIAGNOSIS — I129 Hypertensive chronic kidney disease with stage 1 through stage 4 chronic kidney disease, or unspecified chronic kidney disease: Secondary | ICD-10-CM | POA: Diagnosis not present

## 2016-10-06 DIAGNOSIS — N189 Chronic kidney disease, unspecified: Secondary | ICD-10-CM | POA: Diagnosis not present

## 2016-10-06 DIAGNOSIS — I8390 Asymptomatic varicose veins of unspecified lower extremity: Secondary | ICD-10-CM | POA: Diagnosis not present

## 2016-10-06 DIAGNOSIS — S81812D Laceration without foreign body, left lower leg, subsequent encounter: Secondary | ICD-10-CM | POA: Diagnosis not present

## 2016-10-06 DIAGNOSIS — R531 Weakness: Secondary | ICD-10-CM | POA: Diagnosis not present

## 2016-10-06 DIAGNOSIS — R262 Difficulty in walking, not elsewhere classified: Secondary | ICD-10-CM | POA: Diagnosis not present

## 2016-10-09 DIAGNOSIS — N189 Chronic kidney disease, unspecified: Secondary | ICD-10-CM | POA: Diagnosis not present

## 2016-10-09 DIAGNOSIS — S81812D Laceration without foreign body, left lower leg, subsequent encounter: Secondary | ICD-10-CM | POA: Diagnosis not present

## 2016-10-09 DIAGNOSIS — I129 Hypertensive chronic kidney disease with stage 1 through stage 4 chronic kidney disease, or unspecified chronic kidney disease: Secondary | ICD-10-CM | POA: Diagnosis not present

## 2016-10-09 DIAGNOSIS — I8390 Asymptomatic varicose veins of unspecified lower extremity: Secondary | ICD-10-CM | POA: Diagnosis not present

## 2016-10-09 DIAGNOSIS — R262 Difficulty in walking, not elsewhere classified: Secondary | ICD-10-CM | POA: Diagnosis not present

## 2016-10-09 DIAGNOSIS — R531 Weakness: Secondary | ICD-10-CM | POA: Diagnosis not present

## 2016-10-11 DIAGNOSIS — N189 Chronic kidney disease, unspecified: Secondary | ICD-10-CM | POA: Diagnosis not present

## 2016-10-11 DIAGNOSIS — S81812D Laceration without foreign body, left lower leg, subsequent encounter: Secondary | ICD-10-CM | POA: Diagnosis not present

## 2016-10-11 DIAGNOSIS — R262 Difficulty in walking, not elsewhere classified: Secondary | ICD-10-CM | POA: Diagnosis not present

## 2016-10-11 DIAGNOSIS — R531 Weakness: Secondary | ICD-10-CM | POA: Diagnosis not present

## 2016-10-11 DIAGNOSIS — I129 Hypertensive chronic kidney disease with stage 1 through stage 4 chronic kidney disease, or unspecified chronic kidney disease: Secondary | ICD-10-CM | POA: Diagnosis not present

## 2016-10-11 DIAGNOSIS — I8390 Asymptomatic varicose veins of unspecified lower extremity: Secondary | ICD-10-CM | POA: Diagnosis not present

## 2016-10-13 DIAGNOSIS — I129 Hypertensive chronic kidney disease with stage 1 through stage 4 chronic kidney disease, or unspecified chronic kidney disease: Secondary | ICD-10-CM | POA: Diagnosis not present

## 2016-10-13 DIAGNOSIS — S81812D Laceration without foreign body, left lower leg, subsequent encounter: Secondary | ICD-10-CM | POA: Diagnosis not present

## 2016-10-13 DIAGNOSIS — I8390 Asymptomatic varicose veins of unspecified lower extremity: Secondary | ICD-10-CM | POA: Diagnosis not present

## 2016-10-13 DIAGNOSIS — R531 Weakness: Secondary | ICD-10-CM | POA: Diagnosis not present

## 2016-10-13 DIAGNOSIS — R262 Difficulty in walking, not elsewhere classified: Secondary | ICD-10-CM | POA: Diagnosis not present

## 2016-10-13 DIAGNOSIS — N189 Chronic kidney disease, unspecified: Secondary | ICD-10-CM | POA: Diagnosis not present

## 2016-10-14 DIAGNOSIS — N189 Chronic kidney disease, unspecified: Secondary | ICD-10-CM | POA: Diagnosis not present

## 2016-10-14 DIAGNOSIS — I8390 Asymptomatic varicose veins of unspecified lower extremity: Secondary | ICD-10-CM | POA: Diagnosis not present

## 2016-10-14 DIAGNOSIS — S81812D Laceration without foreign body, left lower leg, subsequent encounter: Secondary | ICD-10-CM | POA: Diagnosis not present

## 2016-10-14 DIAGNOSIS — R531 Weakness: Secondary | ICD-10-CM | POA: Diagnosis not present

## 2016-10-14 DIAGNOSIS — I129 Hypertensive chronic kidney disease with stage 1 through stage 4 chronic kidney disease, or unspecified chronic kidney disease: Secondary | ICD-10-CM | POA: Diagnosis not present

## 2016-10-14 DIAGNOSIS — R262 Difficulty in walking, not elsewhere classified: Secondary | ICD-10-CM | POA: Diagnosis not present

## 2016-10-16 DIAGNOSIS — I8390 Asymptomatic varicose veins of unspecified lower extremity: Secondary | ICD-10-CM | POA: Diagnosis not present

## 2016-10-16 DIAGNOSIS — S81812D Laceration without foreign body, left lower leg, subsequent encounter: Secondary | ICD-10-CM | POA: Diagnosis not present

## 2016-10-16 DIAGNOSIS — N189 Chronic kidney disease, unspecified: Secondary | ICD-10-CM | POA: Diagnosis not present

## 2016-10-16 DIAGNOSIS — I129 Hypertensive chronic kidney disease with stage 1 through stage 4 chronic kidney disease, or unspecified chronic kidney disease: Secondary | ICD-10-CM | POA: Diagnosis not present

## 2016-10-16 DIAGNOSIS — R531 Weakness: Secondary | ICD-10-CM | POA: Diagnosis not present

## 2016-10-16 DIAGNOSIS — R262 Difficulty in walking, not elsewhere classified: Secondary | ICD-10-CM | POA: Diagnosis not present

## 2016-10-18 DIAGNOSIS — S81812D Laceration without foreign body, left lower leg, subsequent encounter: Secondary | ICD-10-CM | POA: Diagnosis not present

## 2016-10-18 DIAGNOSIS — I129 Hypertensive chronic kidney disease with stage 1 through stage 4 chronic kidney disease, or unspecified chronic kidney disease: Secondary | ICD-10-CM | POA: Diagnosis not present

## 2016-10-18 DIAGNOSIS — N189 Chronic kidney disease, unspecified: Secondary | ICD-10-CM | POA: Diagnosis not present

## 2016-10-18 DIAGNOSIS — I8390 Asymptomatic varicose veins of unspecified lower extremity: Secondary | ICD-10-CM | POA: Diagnosis not present

## 2016-10-18 DIAGNOSIS — R531 Weakness: Secondary | ICD-10-CM | POA: Diagnosis not present

## 2016-10-18 DIAGNOSIS — R262 Difficulty in walking, not elsewhere classified: Secondary | ICD-10-CM | POA: Diagnosis not present

## 2016-10-20 DIAGNOSIS — S81812D Laceration without foreign body, left lower leg, subsequent encounter: Secondary | ICD-10-CM | POA: Diagnosis not present

## 2016-10-20 DIAGNOSIS — R531 Weakness: Secondary | ICD-10-CM | POA: Diagnosis not present

## 2016-10-20 DIAGNOSIS — I129 Hypertensive chronic kidney disease with stage 1 through stage 4 chronic kidney disease, or unspecified chronic kidney disease: Secondary | ICD-10-CM | POA: Diagnosis not present

## 2016-10-20 DIAGNOSIS — N189 Chronic kidney disease, unspecified: Secondary | ICD-10-CM | POA: Diagnosis not present

## 2016-10-20 DIAGNOSIS — I8390 Asymptomatic varicose veins of unspecified lower extremity: Secondary | ICD-10-CM | POA: Diagnosis not present

## 2016-10-20 DIAGNOSIS — R262 Difficulty in walking, not elsewhere classified: Secondary | ICD-10-CM | POA: Diagnosis not present

## 2016-10-23 DIAGNOSIS — R262 Difficulty in walking, not elsewhere classified: Secondary | ICD-10-CM | POA: Diagnosis not present

## 2016-10-23 DIAGNOSIS — S81812D Laceration without foreign body, left lower leg, subsequent encounter: Secondary | ICD-10-CM | POA: Diagnosis not present

## 2016-10-23 DIAGNOSIS — I8390 Asymptomatic varicose veins of unspecified lower extremity: Secondary | ICD-10-CM | POA: Diagnosis not present

## 2016-10-23 DIAGNOSIS — N189 Chronic kidney disease, unspecified: Secondary | ICD-10-CM | POA: Diagnosis not present

## 2016-10-23 DIAGNOSIS — I129 Hypertensive chronic kidney disease with stage 1 through stage 4 chronic kidney disease, or unspecified chronic kidney disease: Secondary | ICD-10-CM | POA: Diagnosis not present

## 2016-10-23 DIAGNOSIS — R531 Weakness: Secondary | ICD-10-CM | POA: Diagnosis not present

## 2016-10-25 DIAGNOSIS — S81812D Laceration without foreign body, left lower leg, subsequent encounter: Secondary | ICD-10-CM | POA: Diagnosis not present

## 2016-10-25 DIAGNOSIS — I8390 Asymptomatic varicose veins of unspecified lower extremity: Secondary | ICD-10-CM | POA: Diagnosis not present

## 2016-10-25 DIAGNOSIS — I129 Hypertensive chronic kidney disease with stage 1 through stage 4 chronic kidney disease, or unspecified chronic kidney disease: Secondary | ICD-10-CM | POA: Diagnosis not present

## 2016-10-25 DIAGNOSIS — N189 Chronic kidney disease, unspecified: Secondary | ICD-10-CM | POA: Diagnosis not present

## 2016-10-25 DIAGNOSIS — R531 Weakness: Secondary | ICD-10-CM | POA: Diagnosis not present

## 2016-10-25 DIAGNOSIS — R262 Difficulty in walking, not elsewhere classified: Secondary | ICD-10-CM | POA: Diagnosis not present

## 2016-10-27 DIAGNOSIS — I8390 Asymptomatic varicose veins of unspecified lower extremity: Secondary | ICD-10-CM | POA: Diagnosis not present

## 2016-10-27 DIAGNOSIS — R531 Weakness: Secondary | ICD-10-CM | POA: Diagnosis not present

## 2016-10-27 DIAGNOSIS — S81812D Laceration without foreign body, left lower leg, subsequent encounter: Secondary | ICD-10-CM | POA: Diagnosis not present

## 2016-10-27 DIAGNOSIS — N189 Chronic kidney disease, unspecified: Secondary | ICD-10-CM | POA: Diagnosis not present

## 2016-10-27 DIAGNOSIS — I129 Hypertensive chronic kidney disease with stage 1 through stage 4 chronic kidney disease, or unspecified chronic kidney disease: Secondary | ICD-10-CM | POA: Diagnosis not present

## 2016-10-27 DIAGNOSIS — R262 Difficulty in walking, not elsewhere classified: Secondary | ICD-10-CM | POA: Diagnosis not present

## 2016-10-30 DIAGNOSIS — S81812D Laceration without foreign body, left lower leg, subsequent encounter: Secondary | ICD-10-CM | POA: Diagnosis not present

## 2016-10-30 DIAGNOSIS — R262 Difficulty in walking, not elsewhere classified: Secondary | ICD-10-CM | POA: Diagnosis not present

## 2016-10-30 DIAGNOSIS — I129 Hypertensive chronic kidney disease with stage 1 through stage 4 chronic kidney disease, or unspecified chronic kidney disease: Secondary | ICD-10-CM | POA: Diagnosis not present

## 2016-10-30 DIAGNOSIS — N189 Chronic kidney disease, unspecified: Secondary | ICD-10-CM | POA: Diagnosis not present

## 2016-10-30 DIAGNOSIS — R531 Weakness: Secondary | ICD-10-CM | POA: Diagnosis not present

## 2016-10-30 DIAGNOSIS — I8390 Asymptomatic varicose veins of unspecified lower extremity: Secondary | ICD-10-CM | POA: Diagnosis not present

## 2016-11-01 DIAGNOSIS — R531 Weakness: Secondary | ICD-10-CM | POA: Diagnosis not present

## 2016-11-01 DIAGNOSIS — I129 Hypertensive chronic kidney disease with stage 1 through stage 4 chronic kidney disease, or unspecified chronic kidney disease: Secondary | ICD-10-CM | POA: Diagnosis not present

## 2016-11-01 DIAGNOSIS — S81812D Laceration without foreign body, left lower leg, subsequent encounter: Secondary | ICD-10-CM | POA: Diagnosis not present

## 2016-11-01 DIAGNOSIS — N189 Chronic kidney disease, unspecified: Secondary | ICD-10-CM | POA: Diagnosis not present

## 2016-11-01 DIAGNOSIS — R262 Difficulty in walking, not elsewhere classified: Secondary | ICD-10-CM | POA: Diagnosis not present

## 2016-11-01 DIAGNOSIS — I8390 Asymptomatic varicose veins of unspecified lower extremity: Secondary | ICD-10-CM | POA: Diagnosis not present

## 2016-11-03 DIAGNOSIS — I129 Hypertensive chronic kidney disease with stage 1 through stage 4 chronic kidney disease, or unspecified chronic kidney disease: Secondary | ICD-10-CM | POA: Diagnosis not present

## 2016-11-03 DIAGNOSIS — R531 Weakness: Secondary | ICD-10-CM | POA: Diagnosis not present

## 2016-11-03 DIAGNOSIS — S81812D Laceration without foreign body, left lower leg, subsequent encounter: Secondary | ICD-10-CM | POA: Diagnosis not present

## 2016-11-03 DIAGNOSIS — I8390 Asymptomatic varicose veins of unspecified lower extremity: Secondary | ICD-10-CM | POA: Diagnosis not present

## 2016-11-03 DIAGNOSIS — N189 Chronic kidney disease, unspecified: Secondary | ICD-10-CM | POA: Diagnosis not present

## 2016-11-03 DIAGNOSIS — R262 Difficulty in walking, not elsewhere classified: Secondary | ICD-10-CM | POA: Diagnosis not present

## 2016-11-06 DIAGNOSIS — N189 Chronic kidney disease, unspecified: Secondary | ICD-10-CM | POA: Diagnosis not present

## 2016-11-06 DIAGNOSIS — S81812D Laceration without foreign body, left lower leg, subsequent encounter: Secondary | ICD-10-CM | POA: Diagnosis not present

## 2016-11-06 DIAGNOSIS — R262 Difficulty in walking, not elsewhere classified: Secondary | ICD-10-CM | POA: Diagnosis not present

## 2016-11-06 DIAGNOSIS — I129 Hypertensive chronic kidney disease with stage 1 through stage 4 chronic kidney disease, or unspecified chronic kidney disease: Secondary | ICD-10-CM | POA: Diagnosis not present

## 2016-11-06 DIAGNOSIS — R531 Weakness: Secondary | ICD-10-CM | POA: Diagnosis not present

## 2016-11-06 DIAGNOSIS — I8390 Asymptomatic varicose veins of unspecified lower extremity: Secondary | ICD-10-CM | POA: Diagnosis not present

## 2016-11-08 DIAGNOSIS — N189 Chronic kidney disease, unspecified: Secondary | ICD-10-CM | POA: Diagnosis not present

## 2016-11-08 DIAGNOSIS — S81812D Laceration without foreign body, left lower leg, subsequent encounter: Secondary | ICD-10-CM | POA: Diagnosis not present

## 2016-11-08 DIAGNOSIS — R262 Difficulty in walking, not elsewhere classified: Secondary | ICD-10-CM | POA: Diagnosis not present

## 2016-11-08 DIAGNOSIS — R531 Weakness: Secondary | ICD-10-CM | POA: Diagnosis not present

## 2016-11-08 DIAGNOSIS — I129 Hypertensive chronic kidney disease with stage 1 through stage 4 chronic kidney disease, or unspecified chronic kidney disease: Secondary | ICD-10-CM | POA: Diagnosis not present

## 2016-11-08 DIAGNOSIS — I8390 Asymptomatic varicose veins of unspecified lower extremity: Secondary | ICD-10-CM | POA: Diagnosis not present

## 2016-11-10 DIAGNOSIS — I8390 Asymptomatic varicose veins of unspecified lower extremity: Secondary | ICD-10-CM | POA: Diagnosis not present

## 2016-11-10 DIAGNOSIS — R262 Difficulty in walking, not elsewhere classified: Secondary | ICD-10-CM | POA: Diagnosis not present

## 2016-11-10 DIAGNOSIS — I129 Hypertensive chronic kidney disease with stage 1 through stage 4 chronic kidney disease, or unspecified chronic kidney disease: Secondary | ICD-10-CM | POA: Diagnosis not present

## 2016-11-10 DIAGNOSIS — S81812D Laceration without foreign body, left lower leg, subsequent encounter: Secondary | ICD-10-CM | POA: Diagnosis not present

## 2016-11-10 DIAGNOSIS — R531 Weakness: Secondary | ICD-10-CM | POA: Diagnosis not present

## 2016-11-10 DIAGNOSIS — N189 Chronic kidney disease, unspecified: Secondary | ICD-10-CM | POA: Diagnosis not present

## 2016-11-13 DIAGNOSIS — R262 Difficulty in walking, not elsewhere classified: Secondary | ICD-10-CM | POA: Diagnosis not present

## 2016-11-13 DIAGNOSIS — R531 Weakness: Secondary | ICD-10-CM | POA: Diagnosis not present

## 2016-11-13 DIAGNOSIS — N189 Chronic kidney disease, unspecified: Secondary | ICD-10-CM | POA: Diagnosis not present

## 2016-11-13 DIAGNOSIS — I8390 Asymptomatic varicose veins of unspecified lower extremity: Secondary | ICD-10-CM | POA: Diagnosis not present

## 2016-11-13 DIAGNOSIS — I129 Hypertensive chronic kidney disease with stage 1 through stage 4 chronic kidney disease, or unspecified chronic kidney disease: Secondary | ICD-10-CM | POA: Diagnosis not present

## 2016-11-13 DIAGNOSIS — S81812D Laceration without foreign body, left lower leg, subsequent encounter: Secondary | ICD-10-CM | POA: Diagnosis not present

## 2016-11-15 DIAGNOSIS — R531 Weakness: Secondary | ICD-10-CM | POA: Diagnosis not present

## 2016-11-15 DIAGNOSIS — R262 Difficulty in walking, not elsewhere classified: Secondary | ICD-10-CM | POA: Diagnosis not present

## 2016-11-15 DIAGNOSIS — S81812D Laceration without foreign body, left lower leg, subsequent encounter: Secondary | ICD-10-CM | POA: Diagnosis not present

## 2016-11-15 DIAGNOSIS — N189 Chronic kidney disease, unspecified: Secondary | ICD-10-CM | POA: Diagnosis not present

## 2016-11-15 DIAGNOSIS — I129 Hypertensive chronic kidney disease with stage 1 through stage 4 chronic kidney disease, or unspecified chronic kidney disease: Secondary | ICD-10-CM | POA: Diagnosis not present

## 2016-11-15 DIAGNOSIS — I8390 Asymptomatic varicose veins of unspecified lower extremity: Secondary | ICD-10-CM | POA: Diagnosis not present

## 2016-11-17 DIAGNOSIS — I129 Hypertensive chronic kidney disease with stage 1 through stage 4 chronic kidney disease, or unspecified chronic kidney disease: Secondary | ICD-10-CM | POA: Diagnosis not present

## 2016-11-17 DIAGNOSIS — R262 Difficulty in walking, not elsewhere classified: Secondary | ICD-10-CM | POA: Diagnosis not present

## 2016-11-17 DIAGNOSIS — R531 Weakness: Secondary | ICD-10-CM | POA: Diagnosis not present

## 2016-11-17 DIAGNOSIS — N189 Chronic kidney disease, unspecified: Secondary | ICD-10-CM | POA: Diagnosis not present

## 2016-11-17 DIAGNOSIS — I8390 Asymptomatic varicose veins of unspecified lower extremity: Secondary | ICD-10-CM | POA: Diagnosis not present

## 2016-11-17 DIAGNOSIS — S81812D Laceration without foreign body, left lower leg, subsequent encounter: Secondary | ICD-10-CM | POA: Diagnosis not present

## 2016-11-20 DIAGNOSIS — R531 Weakness: Secondary | ICD-10-CM | POA: Diagnosis not present

## 2016-11-20 DIAGNOSIS — R262 Difficulty in walking, not elsewhere classified: Secondary | ICD-10-CM | POA: Diagnosis not present

## 2016-11-20 DIAGNOSIS — N189 Chronic kidney disease, unspecified: Secondary | ICD-10-CM | POA: Diagnosis not present

## 2016-11-20 DIAGNOSIS — S81812D Laceration without foreign body, left lower leg, subsequent encounter: Secondary | ICD-10-CM | POA: Diagnosis not present

## 2016-11-20 DIAGNOSIS — I129 Hypertensive chronic kidney disease with stage 1 through stage 4 chronic kidney disease, or unspecified chronic kidney disease: Secondary | ICD-10-CM | POA: Diagnosis not present

## 2016-11-20 DIAGNOSIS — I8390 Asymptomatic varicose veins of unspecified lower extremity: Secondary | ICD-10-CM | POA: Diagnosis not present

## 2016-11-21 DIAGNOSIS — S81812D Laceration without foreign body, left lower leg, subsequent encounter: Secondary | ICD-10-CM | POA: Diagnosis not present

## 2016-11-21 DIAGNOSIS — N189 Chronic kidney disease, unspecified: Secondary | ICD-10-CM | POA: Diagnosis not present

## 2016-11-21 DIAGNOSIS — I129 Hypertensive chronic kidney disease with stage 1 through stage 4 chronic kidney disease, or unspecified chronic kidney disease: Secondary | ICD-10-CM | POA: Diagnosis not present

## 2016-11-21 DIAGNOSIS — R531 Weakness: Secondary | ICD-10-CM | POA: Diagnosis not present

## 2016-11-21 DIAGNOSIS — R262 Difficulty in walking, not elsewhere classified: Secondary | ICD-10-CM | POA: Diagnosis not present

## 2016-11-21 DIAGNOSIS — I8390 Asymptomatic varicose veins of unspecified lower extremity: Secondary | ICD-10-CM | POA: Diagnosis not present

## 2016-11-22 DIAGNOSIS — R262 Difficulty in walking, not elsewhere classified: Secondary | ICD-10-CM | POA: Diagnosis not present

## 2016-11-22 DIAGNOSIS — S81812D Laceration without foreign body, left lower leg, subsequent encounter: Secondary | ICD-10-CM | POA: Diagnosis not present

## 2016-11-22 DIAGNOSIS — N189 Chronic kidney disease, unspecified: Secondary | ICD-10-CM | POA: Diagnosis not present

## 2016-11-22 DIAGNOSIS — I8390 Asymptomatic varicose veins of unspecified lower extremity: Secondary | ICD-10-CM | POA: Diagnosis not present

## 2016-11-22 DIAGNOSIS — R531 Weakness: Secondary | ICD-10-CM | POA: Diagnosis not present

## 2016-11-22 DIAGNOSIS — I129 Hypertensive chronic kidney disease with stage 1 through stage 4 chronic kidney disease, or unspecified chronic kidney disease: Secondary | ICD-10-CM | POA: Diagnosis not present

## 2016-11-24 DIAGNOSIS — R262 Difficulty in walking, not elsewhere classified: Secondary | ICD-10-CM | POA: Diagnosis not present

## 2016-11-24 DIAGNOSIS — S81812D Laceration without foreign body, left lower leg, subsequent encounter: Secondary | ICD-10-CM | POA: Diagnosis not present

## 2016-11-24 DIAGNOSIS — I8390 Asymptomatic varicose veins of unspecified lower extremity: Secondary | ICD-10-CM | POA: Diagnosis not present

## 2016-11-24 DIAGNOSIS — R531 Weakness: Secondary | ICD-10-CM | POA: Diagnosis not present

## 2016-11-24 DIAGNOSIS — I129 Hypertensive chronic kidney disease with stage 1 through stage 4 chronic kidney disease, or unspecified chronic kidney disease: Secondary | ICD-10-CM | POA: Diagnosis not present

## 2016-11-24 DIAGNOSIS — N189 Chronic kidney disease, unspecified: Secondary | ICD-10-CM | POA: Diagnosis not present

## 2016-11-27 DIAGNOSIS — S81812D Laceration without foreign body, left lower leg, subsequent encounter: Secondary | ICD-10-CM | POA: Diagnosis not present

## 2016-11-27 DIAGNOSIS — N189 Chronic kidney disease, unspecified: Secondary | ICD-10-CM | POA: Diagnosis not present

## 2016-11-27 DIAGNOSIS — I8390 Asymptomatic varicose veins of unspecified lower extremity: Secondary | ICD-10-CM | POA: Diagnosis not present

## 2016-11-27 DIAGNOSIS — R531 Weakness: Secondary | ICD-10-CM | POA: Diagnosis not present

## 2016-11-27 DIAGNOSIS — R262 Difficulty in walking, not elsewhere classified: Secondary | ICD-10-CM | POA: Diagnosis not present

## 2016-11-27 DIAGNOSIS — I129 Hypertensive chronic kidney disease with stage 1 through stage 4 chronic kidney disease, or unspecified chronic kidney disease: Secondary | ICD-10-CM | POA: Diagnosis not present

## 2016-11-29 DIAGNOSIS — S81812D Laceration without foreign body, left lower leg, subsequent encounter: Secondary | ICD-10-CM | POA: Diagnosis not present

## 2016-11-29 DIAGNOSIS — I129 Hypertensive chronic kidney disease with stage 1 through stage 4 chronic kidney disease, or unspecified chronic kidney disease: Secondary | ICD-10-CM | POA: Diagnosis not present

## 2016-11-29 DIAGNOSIS — N189 Chronic kidney disease, unspecified: Secondary | ICD-10-CM | POA: Diagnosis not present

## 2016-11-29 DIAGNOSIS — I8390 Asymptomatic varicose veins of unspecified lower extremity: Secondary | ICD-10-CM | POA: Diagnosis not present

## 2016-11-29 DIAGNOSIS — R531 Weakness: Secondary | ICD-10-CM | POA: Diagnosis not present

## 2016-11-29 DIAGNOSIS — R262 Difficulty in walking, not elsewhere classified: Secondary | ICD-10-CM | POA: Diagnosis not present

## 2016-12-01 DIAGNOSIS — R262 Difficulty in walking, not elsewhere classified: Secondary | ICD-10-CM | POA: Diagnosis not present

## 2016-12-01 DIAGNOSIS — R531 Weakness: Secondary | ICD-10-CM | POA: Diagnosis not present

## 2016-12-01 DIAGNOSIS — S81812D Laceration without foreign body, left lower leg, subsequent encounter: Secondary | ICD-10-CM | POA: Diagnosis not present

## 2016-12-01 DIAGNOSIS — I129 Hypertensive chronic kidney disease with stage 1 through stage 4 chronic kidney disease, or unspecified chronic kidney disease: Secondary | ICD-10-CM | POA: Diagnosis not present

## 2016-12-01 DIAGNOSIS — I8390 Asymptomatic varicose veins of unspecified lower extremity: Secondary | ICD-10-CM | POA: Diagnosis not present

## 2016-12-01 DIAGNOSIS — N189 Chronic kidney disease, unspecified: Secondary | ICD-10-CM | POA: Diagnosis not present

## 2016-12-04 DIAGNOSIS — R531 Weakness: Secondary | ICD-10-CM | POA: Diagnosis not present

## 2016-12-04 DIAGNOSIS — I8390 Asymptomatic varicose veins of unspecified lower extremity: Secondary | ICD-10-CM | POA: Diagnosis not present

## 2016-12-04 DIAGNOSIS — R262 Difficulty in walking, not elsewhere classified: Secondary | ICD-10-CM | POA: Diagnosis not present

## 2016-12-04 DIAGNOSIS — N189 Chronic kidney disease, unspecified: Secondary | ICD-10-CM | POA: Diagnosis not present

## 2016-12-04 DIAGNOSIS — S81812D Laceration without foreign body, left lower leg, subsequent encounter: Secondary | ICD-10-CM | POA: Diagnosis not present

## 2016-12-04 DIAGNOSIS — I129 Hypertensive chronic kidney disease with stage 1 through stage 4 chronic kidney disease, or unspecified chronic kidney disease: Secondary | ICD-10-CM | POA: Diagnosis not present

## 2016-12-06 DIAGNOSIS — N189 Chronic kidney disease, unspecified: Secondary | ICD-10-CM | POA: Diagnosis not present

## 2016-12-06 DIAGNOSIS — R262 Difficulty in walking, not elsewhere classified: Secondary | ICD-10-CM | POA: Diagnosis not present

## 2016-12-06 DIAGNOSIS — R531 Weakness: Secondary | ICD-10-CM | POA: Diagnosis not present

## 2016-12-06 DIAGNOSIS — I8390 Asymptomatic varicose veins of unspecified lower extremity: Secondary | ICD-10-CM | POA: Diagnosis not present

## 2016-12-06 DIAGNOSIS — I129 Hypertensive chronic kidney disease with stage 1 through stage 4 chronic kidney disease, or unspecified chronic kidney disease: Secondary | ICD-10-CM | POA: Diagnosis not present

## 2016-12-06 DIAGNOSIS — S81812D Laceration without foreign body, left lower leg, subsequent encounter: Secondary | ICD-10-CM | POA: Diagnosis not present

## 2016-12-08 DIAGNOSIS — N189 Chronic kidney disease, unspecified: Secondary | ICD-10-CM | POA: Diagnosis not present

## 2016-12-08 DIAGNOSIS — S81812D Laceration without foreign body, left lower leg, subsequent encounter: Secondary | ICD-10-CM | POA: Diagnosis not present

## 2016-12-08 DIAGNOSIS — I129 Hypertensive chronic kidney disease with stage 1 through stage 4 chronic kidney disease, or unspecified chronic kidney disease: Secondary | ICD-10-CM | POA: Diagnosis not present

## 2016-12-08 DIAGNOSIS — R262 Difficulty in walking, not elsewhere classified: Secondary | ICD-10-CM | POA: Diagnosis not present

## 2016-12-08 DIAGNOSIS — R531 Weakness: Secondary | ICD-10-CM | POA: Diagnosis not present

## 2016-12-08 DIAGNOSIS — I8390 Asymptomatic varicose veins of unspecified lower extremity: Secondary | ICD-10-CM | POA: Diagnosis not present

## 2016-12-11 DIAGNOSIS — L03115 Cellulitis of right lower limb: Secondary | ICD-10-CM | POA: Diagnosis not present

## 2016-12-11 DIAGNOSIS — I129 Hypertensive chronic kidney disease with stage 1 through stage 4 chronic kidney disease, or unspecified chronic kidney disease: Secondary | ICD-10-CM | POA: Diagnosis not present

## 2016-12-11 DIAGNOSIS — S81812D Laceration without foreign body, left lower leg, subsequent encounter: Secondary | ICD-10-CM | POA: Diagnosis not present

## 2016-12-11 DIAGNOSIS — N189 Chronic kidney disease, unspecified: Secondary | ICD-10-CM | POA: Diagnosis not present

## 2016-12-11 DIAGNOSIS — L97922 Non-pressure chronic ulcer of unspecified part of left lower leg with fat layer exposed: Secondary | ICD-10-CM | POA: Diagnosis not present

## 2016-12-11 DIAGNOSIS — I8390 Asymptomatic varicose veins of unspecified lower extremity: Secondary | ICD-10-CM | POA: Diagnosis not present

## 2016-12-11 DIAGNOSIS — R262 Difficulty in walking, not elsewhere classified: Secondary | ICD-10-CM | POA: Diagnosis not present

## 2016-12-11 DIAGNOSIS — R531 Weakness: Secondary | ICD-10-CM | POA: Diagnosis not present

## 2016-12-11 DIAGNOSIS — L03116 Cellulitis of left lower limb: Secondary | ICD-10-CM | POA: Diagnosis not present

## 2016-12-12 DIAGNOSIS — R531 Weakness: Secondary | ICD-10-CM | POA: Diagnosis not present

## 2016-12-12 DIAGNOSIS — I8390 Asymptomatic varicose veins of unspecified lower extremity: Secondary | ICD-10-CM | POA: Diagnosis not present

## 2016-12-12 DIAGNOSIS — I129 Hypertensive chronic kidney disease with stage 1 through stage 4 chronic kidney disease, or unspecified chronic kidney disease: Secondary | ICD-10-CM | POA: Diagnosis not present

## 2016-12-12 DIAGNOSIS — S81812D Laceration without foreign body, left lower leg, subsequent encounter: Secondary | ICD-10-CM | POA: Diagnosis not present

## 2016-12-12 DIAGNOSIS — N189 Chronic kidney disease, unspecified: Secondary | ICD-10-CM | POA: Diagnosis not present

## 2016-12-12 DIAGNOSIS — R262 Difficulty in walking, not elsewhere classified: Secondary | ICD-10-CM | POA: Diagnosis not present

## 2016-12-13 DIAGNOSIS — I8390 Asymptomatic varicose veins of unspecified lower extremity: Secondary | ICD-10-CM | POA: Diagnosis not present

## 2016-12-13 DIAGNOSIS — N189 Chronic kidney disease, unspecified: Secondary | ICD-10-CM | POA: Diagnosis not present

## 2016-12-13 DIAGNOSIS — S81812D Laceration without foreign body, left lower leg, subsequent encounter: Secondary | ICD-10-CM | POA: Diagnosis not present

## 2016-12-13 DIAGNOSIS — I129 Hypertensive chronic kidney disease with stage 1 through stage 4 chronic kidney disease, or unspecified chronic kidney disease: Secondary | ICD-10-CM | POA: Diagnosis not present

## 2016-12-20 DIAGNOSIS — Z6829 Body mass index (BMI) 29.0-29.9, adult: Secondary | ICD-10-CM | POA: Diagnosis not present

## 2016-12-20 DIAGNOSIS — I10 Essential (primary) hypertension: Secondary | ICD-10-CM | POA: Diagnosis not present

## 2016-12-20 DIAGNOSIS — Z299 Encounter for prophylactic measures, unspecified: Secondary | ICD-10-CM | POA: Diagnosis not present

## 2016-12-20 DIAGNOSIS — H903 Sensorineural hearing loss, bilateral: Secondary | ICD-10-CM | POA: Diagnosis not present

## 2016-12-20 DIAGNOSIS — Z23 Encounter for immunization: Secondary | ICD-10-CM | POA: Diagnosis not present

## 2016-12-20 DIAGNOSIS — R609 Edema, unspecified: Secondary | ICD-10-CM | POA: Diagnosis not present

## 2016-12-20 DIAGNOSIS — H6123 Impacted cerumen, bilateral: Secondary | ICD-10-CM | POA: Diagnosis not present

## 2016-12-20 DIAGNOSIS — L98499 Non-pressure chronic ulcer of skin of other sites with unspecified severity: Secondary | ICD-10-CM | POA: Diagnosis not present

## 2016-12-26 DIAGNOSIS — I10 Essential (primary) hypertension: Secondary | ICD-10-CM | POA: Diagnosis not present

## 2016-12-26 DIAGNOSIS — I83028 Varicose veins of left lower extremity with ulcer other part of lower leg: Secondary | ICD-10-CM | POA: Diagnosis not present

## 2016-12-26 DIAGNOSIS — C50919 Malignant neoplasm of unspecified site of unspecified female breast: Secondary | ICD-10-CM | POA: Diagnosis not present

## 2016-12-26 DIAGNOSIS — S8012XS Contusion of left lower leg, sequela: Secondary | ICD-10-CM | POA: Diagnosis not present

## 2016-12-26 DIAGNOSIS — L97823 Non-pressure chronic ulcer of other part of left lower leg with necrosis of muscle: Secondary | ICD-10-CM | POA: Diagnosis not present

## 2016-12-26 DIAGNOSIS — W228XXS Striking against or struck by other objects, sequela: Secondary | ICD-10-CM | POA: Diagnosis not present

## 2016-12-26 DIAGNOSIS — Z7982 Long term (current) use of aspirin: Secondary | ICD-10-CM | POA: Diagnosis not present

## 2016-12-26 DIAGNOSIS — Z79899 Other long term (current) drug therapy: Secondary | ICD-10-CM | POA: Diagnosis not present

## 2016-12-29 DIAGNOSIS — I872 Venous insufficiency (chronic) (peripheral): Secondary | ICD-10-CM | POA: Diagnosis not present

## 2016-12-29 DIAGNOSIS — L97829 Non-pressure chronic ulcer of other part of left lower leg with unspecified severity: Secondary | ICD-10-CM | POA: Diagnosis not present

## 2017-01-02 DIAGNOSIS — W228XXS Striking against or struck by other objects, sequela: Secondary | ICD-10-CM | POA: Diagnosis not present

## 2017-01-02 DIAGNOSIS — Z7982 Long term (current) use of aspirin: Secondary | ICD-10-CM | POA: Diagnosis not present

## 2017-01-02 DIAGNOSIS — S81812D Laceration without foreign body, left lower leg, subsequent encounter: Secondary | ICD-10-CM | POA: Diagnosis not present

## 2017-01-02 DIAGNOSIS — S8012XS Contusion of left lower leg, sequela: Secondary | ICD-10-CM | POA: Diagnosis not present

## 2017-01-02 DIAGNOSIS — C50919 Malignant neoplasm of unspecified site of unspecified female breast: Secondary | ICD-10-CM | POA: Diagnosis not present

## 2017-01-02 DIAGNOSIS — Z79899 Other long term (current) drug therapy: Secondary | ICD-10-CM | POA: Diagnosis not present

## 2017-01-02 DIAGNOSIS — I10 Essential (primary) hypertension: Secondary | ICD-10-CM | POA: Diagnosis not present

## 2017-01-02 DIAGNOSIS — L97823 Non-pressure chronic ulcer of other part of left lower leg with necrosis of muscle: Secondary | ICD-10-CM | POA: Diagnosis not present

## 2017-01-09 DIAGNOSIS — E7800 Pure hypercholesterolemia, unspecified: Secondary | ICD-10-CM | POA: Diagnosis not present

## 2017-01-09 DIAGNOSIS — Z853 Personal history of malignant neoplasm of breast: Secondary | ICD-10-CM | POA: Diagnosis not present

## 2017-01-09 DIAGNOSIS — L03116 Cellulitis of left lower limb: Secondary | ICD-10-CM | POA: Diagnosis not present

## 2017-01-09 DIAGNOSIS — R278 Other lack of coordination: Secondary | ICD-10-CM | POA: Diagnosis not present

## 2017-01-09 DIAGNOSIS — L97912 Non-pressure chronic ulcer of unspecified part of right lower leg with fat layer exposed: Secondary | ICD-10-CM | POA: Diagnosis not present

## 2017-01-09 DIAGNOSIS — R2689 Other abnormalities of gait and mobility: Secondary | ICD-10-CM | POA: Diagnosis not present

## 2017-01-09 DIAGNOSIS — M6281 Muscle weakness (generalized): Secondary | ICD-10-CM | POA: Diagnosis not present

## 2017-01-09 DIAGNOSIS — I872 Venous insufficiency (chronic) (peripheral): Secondary | ICD-10-CM | POA: Diagnosis not present

## 2017-01-09 DIAGNOSIS — Z79899 Other long term (current) drug therapy: Secondary | ICD-10-CM | POA: Diagnosis not present

## 2017-01-09 DIAGNOSIS — L97929 Non-pressure chronic ulcer of unspecified part of left lower leg with unspecified severity: Secondary | ICD-10-CM | POA: Diagnosis not present

## 2017-01-09 DIAGNOSIS — B958 Unspecified staphylococcus as the cause of diseases classified elsewhere: Secondary | ICD-10-CM | POA: Diagnosis not present

## 2017-01-09 DIAGNOSIS — B9561 Methicillin susceptible Staphylococcus aureus infection as the cause of diseases classified elsewhere: Secondary | ICD-10-CM | POA: Diagnosis not present

## 2017-01-09 DIAGNOSIS — E785 Hyperlipidemia, unspecified: Secondary | ICD-10-CM | POA: Diagnosis not present

## 2017-01-09 DIAGNOSIS — I129 Hypertensive chronic kidney disease with stage 1 through stage 4 chronic kidney disease, or unspecified chronic kidney disease: Secondary | ICD-10-CM | POA: Diagnosis not present

## 2017-01-09 DIAGNOSIS — Z7982 Long term (current) use of aspirin: Secondary | ICD-10-CM | POA: Diagnosis not present

## 2017-01-09 DIAGNOSIS — R609 Edema, unspecified: Secondary | ICD-10-CM | POA: Diagnosis not present

## 2017-01-09 DIAGNOSIS — N189 Chronic kidney disease, unspecified: Secondary | ICD-10-CM | POA: Diagnosis not present

## 2017-01-10 DIAGNOSIS — L03116 Cellulitis of left lower limb: Secondary | ICD-10-CM | POA: Diagnosis not present

## 2017-01-10 DIAGNOSIS — I872 Venous insufficiency (chronic) (peripheral): Secondary | ICD-10-CM | POA: Diagnosis not present

## 2017-01-10 DIAGNOSIS — B9561 Methicillin susceptible Staphylococcus aureus infection as the cause of diseases classified elsewhere: Secondary | ICD-10-CM | POA: Diagnosis not present

## 2017-01-10 DIAGNOSIS — E7800 Pure hypercholesterolemia, unspecified: Secondary | ICD-10-CM | POA: Diagnosis not present

## 2017-01-11 DIAGNOSIS — I872 Venous insufficiency (chronic) (peripheral): Secondary | ICD-10-CM | POA: Diagnosis not present

## 2017-01-11 DIAGNOSIS — E7800 Pure hypercholesterolemia, unspecified: Secondary | ICD-10-CM | POA: Diagnosis not present

## 2017-01-11 DIAGNOSIS — L03116 Cellulitis of left lower limb: Secondary | ICD-10-CM | POA: Diagnosis not present

## 2017-01-11 DIAGNOSIS — B9561 Methicillin susceptible Staphylococcus aureus infection as the cause of diseases classified elsewhere: Secondary | ICD-10-CM | POA: Diagnosis not present

## 2017-01-12 DIAGNOSIS — E7800 Pure hypercholesterolemia, unspecified: Secondary | ICD-10-CM | POA: Diagnosis not present

## 2017-01-12 DIAGNOSIS — L03116 Cellulitis of left lower limb: Secondary | ICD-10-CM | POA: Diagnosis not present

## 2017-01-12 DIAGNOSIS — I872 Venous insufficiency (chronic) (peripheral): Secondary | ICD-10-CM | POA: Diagnosis not present

## 2017-01-12 DIAGNOSIS — B9561 Methicillin susceptible Staphylococcus aureus infection as the cause of diseases classified elsewhere: Secondary | ICD-10-CM | POA: Diagnosis not present

## 2017-01-13 DIAGNOSIS — I872 Venous insufficiency (chronic) (peripheral): Secondary | ICD-10-CM | POA: Diagnosis not present

## 2017-01-13 DIAGNOSIS — B9561 Methicillin susceptible Staphylococcus aureus infection as the cause of diseases classified elsewhere: Secondary | ICD-10-CM | POA: Diagnosis not present

## 2017-01-13 DIAGNOSIS — E7800 Pure hypercholesterolemia, unspecified: Secondary | ICD-10-CM | POA: Diagnosis not present

## 2017-01-13 DIAGNOSIS — L03116 Cellulitis of left lower limb: Secondary | ICD-10-CM | POA: Diagnosis not present

## 2017-01-14 DIAGNOSIS — I872 Venous insufficiency (chronic) (peripheral): Secondary | ICD-10-CM | POA: Diagnosis not present

## 2017-01-14 DIAGNOSIS — E7800 Pure hypercholesterolemia, unspecified: Secondary | ICD-10-CM | POA: Diagnosis not present

## 2017-01-14 DIAGNOSIS — L03116 Cellulitis of left lower limb: Secondary | ICD-10-CM | POA: Diagnosis not present

## 2017-01-14 DIAGNOSIS — B9561 Methicillin susceptible Staphylococcus aureus infection as the cause of diseases classified elsewhere: Secondary | ICD-10-CM | POA: Diagnosis not present

## 2017-01-15 DIAGNOSIS — L03116 Cellulitis of left lower limb: Secondary | ICD-10-CM | POA: Diagnosis not present

## 2017-01-15 DIAGNOSIS — B9561 Methicillin susceptible Staphylococcus aureus infection as the cause of diseases classified elsewhere: Secondary | ICD-10-CM | POA: Diagnosis not present

## 2017-01-15 DIAGNOSIS — E7800 Pure hypercholesterolemia, unspecified: Secondary | ICD-10-CM | POA: Diagnosis not present

## 2017-01-15 DIAGNOSIS — I872 Venous insufficiency (chronic) (peripheral): Secondary | ICD-10-CM | POA: Diagnosis not present

## 2017-01-16 DIAGNOSIS — L03116 Cellulitis of left lower limb: Secondary | ICD-10-CM | POA: Diagnosis not present

## 2017-01-16 DIAGNOSIS — I872 Venous insufficiency (chronic) (peripheral): Secondary | ICD-10-CM | POA: Diagnosis not present

## 2017-01-16 DIAGNOSIS — B9561 Methicillin susceptible Staphylococcus aureus infection as the cause of diseases classified elsewhere: Secondary | ICD-10-CM | POA: Diagnosis not present

## 2017-01-16 DIAGNOSIS — E7800 Pure hypercholesterolemia, unspecified: Secondary | ICD-10-CM | POA: Diagnosis not present

## 2017-01-17 DIAGNOSIS — E7800 Pure hypercholesterolemia, unspecified: Secondary | ICD-10-CM | POA: Diagnosis not present

## 2017-01-17 DIAGNOSIS — B958 Unspecified staphylococcus as the cause of diseases classified elsewhere: Secondary | ICD-10-CM | POA: Diagnosis not present

## 2017-01-17 DIAGNOSIS — I872 Venous insufficiency (chronic) (peripheral): Secondary | ICD-10-CM | POA: Diagnosis not present

## 2017-01-17 DIAGNOSIS — L03116 Cellulitis of left lower limb: Secondary | ICD-10-CM | POA: Diagnosis not present

## 2017-01-17 DIAGNOSIS — L97929 Non-pressure chronic ulcer of unspecified part of left lower leg with unspecified severity: Secondary | ICD-10-CM | POA: Diagnosis not present

## 2017-01-17 DIAGNOSIS — I129 Hypertensive chronic kidney disease with stage 1 through stage 4 chronic kidney disease, or unspecified chronic kidney disease: Secondary | ICD-10-CM | POA: Diagnosis not present

## 2017-01-17 DIAGNOSIS — B9561 Methicillin susceptible Staphylococcus aureus infection as the cause of diseases classified elsewhere: Secondary | ICD-10-CM | POA: Diagnosis not present

## 2017-01-17 DIAGNOSIS — E785 Hyperlipidemia, unspecified: Secondary | ICD-10-CM | POA: Diagnosis not present

## 2017-01-17 DIAGNOSIS — M6281 Muscle weakness (generalized): Secondary | ICD-10-CM | POA: Diagnosis not present

## 2017-01-17 DIAGNOSIS — R278 Other lack of coordination: Secondary | ICD-10-CM | POA: Diagnosis not present

## 2017-01-17 DIAGNOSIS — R609 Edema, unspecified: Secondary | ICD-10-CM | POA: Diagnosis not present

## 2017-01-17 DIAGNOSIS — N189 Chronic kidney disease, unspecified: Secondary | ICD-10-CM | POA: Diagnosis not present

## 2017-01-17 DIAGNOSIS — R2689 Other abnormalities of gait and mobility: Secondary | ICD-10-CM | POA: Diagnosis not present

## 2017-01-19 DIAGNOSIS — L03116 Cellulitis of left lower limb: Secondary | ICD-10-CM | POA: Diagnosis not present

## 2017-01-19 DIAGNOSIS — L97929 Non-pressure chronic ulcer of unspecified part of left lower leg with unspecified severity: Secondary | ICD-10-CM | POA: Diagnosis not present

## 2017-02-11 DIAGNOSIS — S81812D Laceration without foreign body, left lower leg, subsequent encounter: Secondary | ICD-10-CM | POA: Diagnosis not present

## 2017-02-11 DIAGNOSIS — N189 Chronic kidney disease, unspecified: Secondary | ICD-10-CM | POA: Diagnosis not present

## 2017-02-11 DIAGNOSIS — I129 Hypertensive chronic kidney disease with stage 1 through stage 4 chronic kidney disease, or unspecified chronic kidney disease: Secondary | ICD-10-CM | POA: Diagnosis not present

## 2017-02-11 DIAGNOSIS — I8390 Asymptomatic varicose veins of unspecified lower extremity: Secondary | ICD-10-CM | POA: Diagnosis not present

## 2017-02-12 DIAGNOSIS — I129 Hypertensive chronic kidney disease with stage 1 through stage 4 chronic kidney disease, or unspecified chronic kidney disease: Secondary | ICD-10-CM | POA: Diagnosis not present

## 2017-02-12 DIAGNOSIS — S81812D Laceration without foreign body, left lower leg, subsequent encounter: Secondary | ICD-10-CM | POA: Diagnosis not present

## 2017-02-12 DIAGNOSIS — I8390 Asymptomatic varicose veins of unspecified lower extremity: Secondary | ICD-10-CM | POA: Diagnosis not present

## 2017-02-12 DIAGNOSIS — N189 Chronic kidney disease, unspecified: Secondary | ICD-10-CM | POA: Diagnosis not present

## 2017-02-14 DIAGNOSIS — I8390 Asymptomatic varicose veins of unspecified lower extremity: Secondary | ICD-10-CM | POA: Diagnosis not present

## 2017-02-14 DIAGNOSIS — I129 Hypertensive chronic kidney disease with stage 1 through stage 4 chronic kidney disease, or unspecified chronic kidney disease: Secondary | ICD-10-CM | POA: Diagnosis not present

## 2017-02-14 DIAGNOSIS — S81812D Laceration without foreign body, left lower leg, subsequent encounter: Secondary | ICD-10-CM | POA: Diagnosis not present

## 2017-02-14 DIAGNOSIS — N189 Chronic kidney disease, unspecified: Secondary | ICD-10-CM | POA: Diagnosis not present

## 2017-02-15 DIAGNOSIS — I129 Hypertensive chronic kidney disease with stage 1 through stage 4 chronic kidney disease, or unspecified chronic kidney disease: Secondary | ICD-10-CM | POA: Diagnosis not present

## 2017-02-15 DIAGNOSIS — N189 Chronic kidney disease, unspecified: Secondary | ICD-10-CM | POA: Diagnosis not present

## 2017-02-15 DIAGNOSIS — S81812D Laceration without foreign body, left lower leg, subsequent encounter: Secondary | ICD-10-CM | POA: Diagnosis not present

## 2017-02-15 DIAGNOSIS — I8390 Asymptomatic varicose veins of unspecified lower extremity: Secondary | ICD-10-CM | POA: Diagnosis not present

## 2017-02-16 DIAGNOSIS — I8390 Asymptomatic varicose veins of unspecified lower extremity: Secondary | ICD-10-CM | POA: Diagnosis not present

## 2017-02-16 DIAGNOSIS — I129 Hypertensive chronic kidney disease with stage 1 through stage 4 chronic kidney disease, or unspecified chronic kidney disease: Secondary | ICD-10-CM | POA: Diagnosis not present

## 2017-02-16 DIAGNOSIS — S81812D Laceration without foreign body, left lower leg, subsequent encounter: Secondary | ICD-10-CM | POA: Diagnosis not present

## 2017-02-16 DIAGNOSIS — N189 Chronic kidney disease, unspecified: Secondary | ICD-10-CM | POA: Diagnosis not present

## 2017-02-20 DIAGNOSIS — I129 Hypertensive chronic kidney disease with stage 1 through stage 4 chronic kidney disease, or unspecified chronic kidney disease: Secondary | ICD-10-CM | POA: Diagnosis not present

## 2017-02-20 DIAGNOSIS — S81812D Laceration without foreign body, left lower leg, subsequent encounter: Secondary | ICD-10-CM | POA: Diagnosis not present

## 2017-02-20 DIAGNOSIS — I8390 Asymptomatic varicose veins of unspecified lower extremity: Secondary | ICD-10-CM | POA: Diagnosis not present

## 2017-02-20 DIAGNOSIS — N189 Chronic kidney disease, unspecified: Secondary | ICD-10-CM | POA: Diagnosis not present

## 2017-02-21 DIAGNOSIS — S81801A Unspecified open wound, right lower leg, initial encounter: Secondary | ICD-10-CM | POA: Diagnosis not present

## 2017-02-21 DIAGNOSIS — S81812D Laceration without foreign body, left lower leg, subsequent encounter: Secondary | ICD-10-CM | POA: Diagnosis not present

## 2017-02-21 DIAGNOSIS — I129 Hypertensive chronic kidney disease with stage 1 through stage 4 chronic kidney disease, or unspecified chronic kidney disease: Secondary | ICD-10-CM | POA: Diagnosis not present

## 2017-02-21 DIAGNOSIS — N189 Chronic kidney disease, unspecified: Secondary | ICD-10-CM | POA: Diagnosis not present

## 2017-02-21 DIAGNOSIS — I8390 Asymptomatic varicose veins of unspecified lower extremity: Secondary | ICD-10-CM | POA: Diagnosis not present

## 2017-02-22 DIAGNOSIS — I129 Hypertensive chronic kidney disease with stage 1 through stage 4 chronic kidney disease, or unspecified chronic kidney disease: Secondary | ICD-10-CM | POA: Diagnosis not present

## 2017-02-22 DIAGNOSIS — N189 Chronic kidney disease, unspecified: Secondary | ICD-10-CM | POA: Diagnosis not present

## 2017-02-22 DIAGNOSIS — S81812D Laceration without foreign body, left lower leg, subsequent encounter: Secondary | ICD-10-CM | POA: Diagnosis not present

## 2017-02-22 DIAGNOSIS — I8390 Asymptomatic varicose veins of unspecified lower extremity: Secondary | ICD-10-CM | POA: Diagnosis not present

## 2017-02-23 DIAGNOSIS — I8390 Asymptomatic varicose veins of unspecified lower extremity: Secondary | ICD-10-CM | POA: Diagnosis not present

## 2017-02-23 DIAGNOSIS — I129 Hypertensive chronic kidney disease with stage 1 through stage 4 chronic kidney disease, or unspecified chronic kidney disease: Secondary | ICD-10-CM | POA: Diagnosis not present

## 2017-02-23 DIAGNOSIS — S81812D Laceration without foreign body, left lower leg, subsequent encounter: Secondary | ICD-10-CM | POA: Diagnosis not present

## 2017-02-23 DIAGNOSIS — N189 Chronic kidney disease, unspecified: Secondary | ICD-10-CM | POA: Diagnosis not present

## 2017-02-24 DIAGNOSIS — I129 Hypertensive chronic kidney disease with stage 1 through stage 4 chronic kidney disease, or unspecified chronic kidney disease: Secondary | ICD-10-CM | POA: Diagnosis not present

## 2017-02-24 DIAGNOSIS — S81812D Laceration without foreign body, left lower leg, subsequent encounter: Secondary | ICD-10-CM | POA: Diagnosis not present

## 2017-02-24 DIAGNOSIS — I8390 Asymptomatic varicose veins of unspecified lower extremity: Secondary | ICD-10-CM | POA: Diagnosis not present

## 2017-02-24 DIAGNOSIS — N189 Chronic kidney disease, unspecified: Secondary | ICD-10-CM | POA: Diagnosis not present

## 2017-02-26 DIAGNOSIS — S81812D Laceration without foreign body, left lower leg, subsequent encounter: Secondary | ICD-10-CM | POA: Diagnosis not present

## 2017-02-26 DIAGNOSIS — I129 Hypertensive chronic kidney disease with stage 1 through stage 4 chronic kidney disease, or unspecified chronic kidney disease: Secondary | ICD-10-CM | POA: Diagnosis not present

## 2017-02-26 DIAGNOSIS — I8390 Asymptomatic varicose veins of unspecified lower extremity: Secondary | ICD-10-CM | POA: Diagnosis not present

## 2017-02-26 DIAGNOSIS — N189 Chronic kidney disease, unspecified: Secondary | ICD-10-CM | POA: Diagnosis not present

## 2017-02-28 DIAGNOSIS — S81812D Laceration without foreign body, left lower leg, subsequent encounter: Secondary | ICD-10-CM | POA: Diagnosis not present

## 2017-02-28 DIAGNOSIS — N189 Chronic kidney disease, unspecified: Secondary | ICD-10-CM | POA: Diagnosis not present

## 2017-02-28 DIAGNOSIS — I129 Hypertensive chronic kidney disease with stage 1 through stage 4 chronic kidney disease, or unspecified chronic kidney disease: Secondary | ICD-10-CM | POA: Diagnosis not present

## 2017-02-28 DIAGNOSIS — I8390 Asymptomatic varicose veins of unspecified lower extremity: Secondary | ICD-10-CM | POA: Diagnosis not present

## 2017-03-02 ENCOUNTER — Other Ambulatory Visit: Payer: Self-pay | Admitting: Hematology and Oncology

## 2017-03-02 DIAGNOSIS — I8390 Asymptomatic varicose veins of unspecified lower extremity: Secondary | ICD-10-CM | POA: Diagnosis not present

## 2017-03-02 DIAGNOSIS — N189 Chronic kidney disease, unspecified: Secondary | ICD-10-CM | POA: Diagnosis not present

## 2017-03-02 DIAGNOSIS — I129 Hypertensive chronic kidney disease with stage 1 through stage 4 chronic kidney disease, or unspecified chronic kidney disease: Secondary | ICD-10-CM | POA: Diagnosis not present

## 2017-03-02 DIAGNOSIS — S81812D Laceration without foreign body, left lower leg, subsequent encounter: Secondary | ICD-10-CM | POA: Diagnosis not present

## 2017-03-05 DIAGNOSIS — I8390 Asymptomatic varicose veins of unspecified lower extremity: Secondary | ICD-10-CM | POA: Diagnosis not present

## 2017-03-05 DIAGNOSIS — N189 Chronic kidney disease, unspecified: Secondary | ICD-10-CM | POA: Diagnosis not present

## 2017-03-05 DIAGNOSIS — S81812D Laceration without foreign body, left lower leg, subsequent encounter: Secondary | ICD-10-CM | POA: Diagnosis not present

## 2017-03-05 DIAGNOSIS — I129 Hypertensive chronic kidney disease with stage 1 through stage 4 chronic kidney disease, or unspecified chronic kidney disease: Secondary | ICD-10-CM | POA: Diagnosis not present

## 2017-03-07 DIAGNOSIS — I129 Hypertensive chronic kidney disease with stage 1 through stage 4 chronic kidney disease, or unspecified chronic kidney disease: Secondary | ICD-10-CM | POA: Diagnosis not present

## 2017-03-07 DIAGNOSIS — S81812D Laceration without foreign body, left lower leg, subsequent encounter: Secondary | ICD-10-CM | POA: Diagnosis not present

## 2017-03-07 DIAGNOSIS — I8390 Asymptomatic varicose veins of unspecified lower extremity: Secondary | ICD-10-CM | POA: Diagnosis not present

## 2017-03-07 DIAGNOSIS — N189 Chronic kidney disease, unspecified: Secondary | ICD-10-CM | POA: Diagnosis not present

## 2017-03-09 DIAGNOSIS — I8390 Asymptomatic varicose veins of unspecified lower extremity: Secondary | ICD-10-CM | POA: Diagnosis not present

## 2017-03-09 DIAGNOSIS — S81812D Laceration without foreign body, left lower leg, subsequent encounter: Secondary | ICD-10-CM | POA: Diagnosis not present

## 2017-03-09 DIAGNOSIS — I129 Hypertensive chronic kidney disease with stage 1 through stage 4 chronic kidney disease, or unspecified chronic kidney disease: Secondary | ICD-10-CM | POA: Diagnosis not present

## 2017-03-09 DIAGNOSIS — N189 Chronic kidney disease, unspecified: Secondary | ICD-10-CM | POA: Diagnosis not present

## 2017-03-12 DIAGNOSIS — I8390 Asymptomatic varicose veins of unspecified lower extremity: Secondary | ICD-10-CM | POA: Diagnosis not present

## 2017-03-12 DIAGNOSIS — N189 Chronic kidney disease, unspecified: Secondary | ICD-10-CM | POA: Diagnosis not present

## 2017-03-12 DIAGNOSIS — I129 Hypertensive chronic kidney disease with stage 1 through stage 4 chronic kidney disease, or unspecified chronic kidney disease: Secondary | ICD-10-CM | POA: Diagnosis not present

## 2017-03-12 DIAGNOSIS — S81812D Laceration without foreign body, left lower leg, subsequent encounter: Secondary | ICD-10-CM | POA: Diagnosis not present

## 2017-03-14 DIAGNOSIS — I129 Hypertensive chronic kidney disease with stage 1 through stage 4 chronic kidney disease, or unspecified chronic kidney disease: Secondary | ICD-10-CM | POA: Diagnosis not present

## 2017-03-14 DIAGNOSIS — S81812D Laceration without foreign body, left lower leg, subsequent encounter: Secondary | ICD-10-CM | POA: Diagnosis not present

## 2017-03-14 DIAGNOSIS — I8390 Asymptomatic varicose veins of unspecified lower extremity: Secondary | ICD-10-CM | POA: Diagnosis not present

## 2017-03-14 DIAGNOSIS — N189 Chronic kidney disease, unspecified: Secondary | ICD-10-CM | POA: Diagnosis not present

## 2017-03-15 DIAGNOSIS — N189 Chronic kidney disease, unspecified: Secondary | ICD-10-CM | POA: Diagnosis not present

## 2017-03-15 DIAGNOSIS — I129 Hypertensive chronic kidney disease with stage 1 through stage 4 chronic kidney disease, or unspecified chronic kidney disease: Secondary | ICD-10-CM | POA: Diagnosis not present

## 2017-03-15 DIAGNOSIS — S81812D Laceration without foreign body, left lower leg, subsequent encounter: Secondary | ICD-10-CM | POA: Diagnosis not present

## 2017-03-15 DIAGNOSIS — I8390 Asymptomatic varicose veins of unspecified lower extremity: Secondary | ICD-10-CM | POA: Diagnosis not present

## 2017-03-16 DIAGNOSIS — I129 Hypertensive chronic kidney disease with stage 1 through stage 4 chronic kidney disease, or unspecified chronic kidney disease: Secondary | ICD-10-CM | POA: Diagnosis not present

## 2017-03-16 DIAGNOSIS — I8390 Asymptomatic varicose veins of unspecified lower extremity: Secondary | ICD-10-CM | POA: Diagnosis not present

## 2017-03-16 DIAGNOSIS — S81812D Laceration without foreign body, left lower leg, subsequent encounter: Secondary | ICD-10-CM | POA: Diagnosis not present

## 2017-03-16 DIAGNOSIS — N189 Chronic kidney disease, unspecified: Secondary | ICD-10-CM | POA: Diagnosis not present

## 2017-03-19 DIAGNOSIS — I129 Hypertensive chronic kidney disease with stage 1 through stage 4 chronic kidney disease, or unspecified chronic kidney disease: Secondary | ICD-10-CM | POA: Diagnosis not present

## 2017-03-19 DIAGNOSIS — S81812D Laceration without foreign body, left lower leg, subsequent encounter: Secondary | ICD-10-CM | POA: Diagnosis not present

## 2017-03-19 DIAGNOSIS — I8390 Asymptomatic varicose veins of unspecified lower extremity: Secondary | ICD-10-CM | POA: Diagnosis not present

## 2017-03-19 DIAGNOSIS — N189 Chronic kidney disease, unspecified: Secondary | ICD-10-CM | POA: Diagnosis not present

## 2017-03-21 DIAGNOSIS — I8390 Asymptomatic varicose veins of unspecified lower extremity: Secondary | ICD-10-CM | POA: Diagnosis not present

## 2017-03-21 DIAGNOSIS — N189 Chronic kidney disease, unspecified: Secondary | ICD-10-CM | POA: Diagnosis not present

## 2017-03-21 DIAGNOSIS — I129 Hypertensive chronic kidney disease with stage 1 through stage 4 chronic kidney disease, or unspecified chronic kidney disease: Secondary | ICD-10-CM | POA: Diagnosis not present

## 2017-03-21 DIAGNOSIS — S81812D Laceration without foreign body, left lower leg, subsequent encounter: Secondary | ICD-10-CM | POA: Diagnosis not present

## 2017-03-22 ENCOUNTER — Encounter (HOSPITAL_BASED_OUTPATIENT_CLINIC_OR_DEPARTMENT_OTHER): Payer: Medicare HMO | Attending: Internal Medicine

## 2017-03-22 DIAGNOSIS — L97222 Non-pressure chronic ulcer of left calf with fat layer exposed: Secondary | ICD-10-CM | POA: Insufficient documentation

## 2017-03-22 DIAGNOSIS — L97822 Non-pressure chronic ulcer of other part of left lower leg with fat layer exposed: Secondary | ICD-10-CM | POA: Diagnosis not present

## 2017-03-22 DIAGNOSIS — I890 Lymphedema, not elsewhere classified: Secondary | ICD-10-CM | POA: Diagnosis not present

## 2017-03-22 DIAGNOSIS — L03115 Cellulitis of right lower limb: Secondary | ICD-10-CM | POA: Diagnosis not present

## 2017-03-22 DIAGNOSIS — I87333 Chronic venous hypertension (idiopathic) with ulcer and inflammation of bilateral lower extremity: Secondary | ICD-10-CM | POA: Insufficient documentation

## 2017-03-22 DIAGNOSIS — I10 Essential (primary) hypertension: Secondary | ICD-10-CM | POA: Insufficient documentation

## 2017-03-22 DIAGNOSIS — L03116 Cellulitis of left lower limb: Secondary | ICD-10-CM | POA: Diagnosis not present

## 2017-03-22 DIAGNOSIS — L97812 Non-pressure chronic ulcer of other part of right lower leg with fat layer exposed: Secondary | ICD-10-CM | POA: Diagnosis not present

## 2017-03-23 DIAGNOSIS — S81812D Laceration without foreign body, left lower leg, subsequent encounter: Secondary | ICD-10-CM | POA: Diagnosis not present

## 2017-03-23 DIAGNOSIS — N189 Chronic kidney disease, unspecified: Secondary | ICD-10-CM | POA: Diagnosis not present

## 2017-03-23 DIAGNOSIS — I129 Hypertensive chronic kidney disease with stage 1 through stage 4 chronic kidney disease, or unspecified chronic kidney disease: Secondary | ICD-10-CM | POA: Diagnosis not present

## 2017-03-23 DIAGNOSIS — I8390 Asymptomatic varicose veins of unspecified lower extremity: Secondary | ICD-10-CM | POA: Diagnosis not present

## 2017-03-25 DIAGNOSIS — S81801A Unspecified open wound, right lower leg, initial encounter: Secondary | ICD-10-CM | POA: Diagnosis not present

## 2017-03-25 DIAGNOSIS — S81812D Laceration without foreign body, left lower leg, subsequent encounter: Secondary | ICD-10-CM | POA: Diagnosis not present

## 2017-03-26 DIAGNOSIS — R269 Unspecified abnormalities of gait and mobility: Secondary | ICD-10-CM | POA: Diagnosis not present

## 2017-03-26 DIAGNOSIS — Z7982 Long term (current) use of aspirin: Secondary | ICD-10-CM | POA: Diagnosis not present

## 2017-03-26 DIAGNOSIS — N189 Chronic kidney disease, unspecified: Secondary | ICD-10-CM | POA: Diagnosis not present

## 2017-03-26 DIAGNOSIS — L98499 Non-pressure chronic ulcer of skin of other sites with unspecified severity: Secondary | ICD-10-CM | POA: Diagnosis not present

## 2017-03-26 DIAGNOSIS — C50919 Malignant neoplasm of unspecified site of unspecified female breast: Secondary | ICD-10-CM | POA: Diagnosis not present

## 2017-03-26 DIAGNOSIS — I10 Essential (primary) hypertension: Secondary | ICD-10-CM | POA: Diagnosis not present

## 2017-03-26 DIAGNOSIS — I129 Hypertensive chronic kidney disease with stage 1 through stage 4 chronic kidney disease, or unspecified chronic kidney disease: Secondary | ICD-10-CM | POA: Diagnosis not present

## 2017-03-26 DIAGNOSIS — Z79811 Long term (current) use of aromatase inhibitors: Secondary | ICD-10-CM | POA: Diagnosis not present

## 2017-03-26 DIAGNOSIS — Z809 Family history of malignant neoplasm, unspecified: Secondary | ICD-10-CM | POA: Diagnosis not present

## 2017-03-26 DIAGNOSIS — S81812D Laceration without foreign body, left lower leg, subsequent encounter: Secondary | ICD-10-CM | POA: Diagnosis not present

## 2017-03-26 DIAGNOSIS — R600 Localized edema: Secondary | ICD-10-CM | POA: Diagnosis not present

## 2017-03-26 DIAGNOSIS — I8390 Asymptomatic varicose veins of unspecified lower extremity: Secondary | ICD-10-CM | POA: Diagnosis not present

## 2017-03-26 DIAGNOSIS — H409 Unspecified glaucoma: Secondary | ICD-10-CM | POA: Diagnosis not present

## 2017-03-26 DIAGNOSIS — Z8249 Family history of ischemic heart disease and other diseases of the circulatory system: Secondary | ICD-10-CM | POA: Diagnosis not present

## 2017-03-28 DIAGNOSIS — I129 Hypertensive chronic kidney disease with stage 1 through stage 4 chronic kidney disease, or unspecified chronic kidney disease: Secondary | ICD-10-CM | POA: Diagnosis not present

## 2017-03-28 DIAGNOSIS — I8390 Asymptomatic varicose veins of unspecified lower extremity: Secondary | ICD-10-CM | POA: Diagnosis not present

## 2017-03-28 DIAGNOSIS — S81812D Laceration without foreign body, left lower leg, subsequent encounter: Secondary | ICD-10-CM | POA: Diagnosis not present

## 2017-03-28 DIAGNOSIS — N189 Chronic kidney disease, unspecified: Secondary | ICD-10-CM | POA: Diagnosis not present

## 2017-03-30 DIAGNOSIS — I890 Lymphedema, not elsewhere classified: Secondary | ICD-10-CM | POA: Diagnosis not present

## 2017-03-30 DIAGNOSIS — I10 Essential (primary) hypertension: Secondary | ICD-10-CM | POA: Diagnosis not present

## 2017-03-30 DIAGNOSIS — L97822 Non-pressure chronic ulcer of other part of left lower leg with fat layer exposed: Secondary | ICD-10-CM | POA: Diagnosis not present

## 2017-03-30 DIAGNOSIS — I87312 Chronic venous hypertension (idiopathic) with ulcer of left lower extremity: Secondary | ICD-10-CM | POA: Diagnosis not present

## 2017-03-30 DIAGNOSIS — L97222 Non-pressure chronic ulcer of left calf with fat layer exposed: Secondary | ICD-10-CM | POA: Diagnosis not present

## 2017-03-30 DIAGNOSIS — I87333 Chronic venous hypertension (idiopathic) with ulcer and inflammation of bilateral lower extremity: Secondary | ICD-10-CM | POA: Diagnosis not present

## 2017-04-02 DIAGNOSIS — N189 Chronic kidney disease, unspecified: Secondary | ICD-10-CM | POA: Diagnosis not present

## 2017-04-02 DIAGNOSIS — S81812D Laceration without foreign body, left lower leg, subsequent encounter: Secondary | ICD-10-CM | POA: Diagnosis not present

## 2017-04-02 DIAGNOSIS — I8390 Asymptomatic varicose veins of unspecified lower extremity: Secondary | ICD-10-CM | POA: Diagnosis not present

## 2017-04-02 DIAGNOSIS — I129 Hypertensive chronic kidney disease with stage 1 through stage 4 chronic kidney disease, or unspecified chronic kidney disease: Secondary | ICD-10-CM | POA: Diagnosis not present

## 2017-04-04 DIAGNOSIS — I8390 Asymptomatic varicose veins of unspecified lower extremity: Secondary | ICD-10-CM | POA: Diagnosis not present

## 2017-04-04 DIAGNOSIS — N189 Chronic kidney disease, unspecified: Secondary | ICD-10-CM | POA: Diagnosis not present

## 2017-04-04 DIAGNOSIS — S81812D Laceration without foreign body, left lower leg, subsequent encounter: Secondary | ICD-10-CM | POA: Diagnosis not present

## 2017-04-04 DIAGNOSIS — I129 Hypertensive chronic kidney disease with stage 1 through stage 4 chronic kidney disease, or unspecified chronic kidney disease: Secondary | ICD-10-CM | POA: Diagnosis not present

## 2017-04-06 DIAGNOSIS — I890 Lymphedema, not elsewhere classified: Secondary | ICD-10-CM | POA: Diagnosis not present

## 2017-04-06 DIAGNOSIS — L97222 Non-pressure chronic ulcer of left calf with fat layer exposed: Secondary | ICD-10-CM | POA: Diagnosis not present

## 2017-04-06 DIAGNOSIS — I10 Essential (primary) hypertension: Secondary | ICD-10-CM | POA: Diagnosis not present

## 2017-04-06 DIAGNOSIS — I87333 Chronic venous hypertension (idiopathic) with ulcer and inflammation of bilateral lower extremity: Secondary | ICD-10-CM | POA: Diagnosis not present

## 2017-04-06 DIAGNOSIS — I87312 Chronic venous hypertension (idiopathic) with ulcer of left lower extremity: Secondary | ICD-10-CM | POA: Diagnosis not present

## 2017-04-06 DIAGNOSIS — L97822 Non-pressure chronic ulcer of other part of left lower leg with fat layer exposed: Secondary | ICD-10-CM | POA: Diagnosis not present

## 2017-04-09 DIAGNOSIS — N189 Chronic kidney disease, unspecified: Secondary | ICD-10-CM | POA: Diagnosis not present

## 2017-04-09 DIAGNOSIS — I129 Hypertensive chronic kidney disease with stage 1 through stage 4 chronic kidney disease, or unspecified chronic kidney disease: Secondary | ICD-10-CM | POA: Diagnosis not present

## 2017-04-09 DIAGNOSIS — S81812D Laceration without foreign body, left lower leg, subsequent encounter: Secondary | ICD-10-CM | POA: Diagnosis not present

## 2017-04-09 DIAGNOSIS — I8390 Asymptomatic varicose veins of unspecified lower extremity: Secondary | ICD-10-CM | POA: Diagnosis not present

## 2017-04-11 DIAGNOSIS — N189 Chronic kidney disease, unspecified: Secondary | ICD-10-CM | POA: Diagnosis not present

## 2017-04-11 DIAGNOSIS — I129 Hypertensive chronic kidney disease with stage 1 through stage 4 chronic kidney disease, or unspecified chronic kidney disease: Secondary | ICD-10-CM | POA: Diagnosis not present

## 2017-04-11 DIAGNOSIS — S81812D Laceration without foreign body, left lower leg, subsequent encounter: Secondary | ICD-10-CM | POA: Diagnosis not present

## 2017-04-11 DIAGNOSIS — I8390 Asymptomatic varicose veins of unspecified lower extremity: Secondary | ICD-10-CM | POA: Diagnosis not present

## 2017-04-12 DIAGNOSIS — I87332 Chronic venous hypertension (idiopathic) with ulcer and inflammation of left lower extremity: Secondary | ICD-10-CM | POA: Diagnosis not present

## 2017-04-12 DIAGNOSIS — I129 Hypertensive chronic kidney disease with stage 1 through stage 4 chronic kidney disease, or unspecified chronic kidney disease: Secondary | ICD-10-CM | POA: Diagnosis not present

## 2017-04-12 DIAGNOSIS — L97221 Non-pressure chronic ulcer of left calf limited to breakdown of skin: Secondary | ICD-10-CM | POA: Diagnosis not present

## 2017-04-12 DIAGNOSIS — I890 Lymphedema, not elsewhere classified: Secondary | ICD-10-CM | POA: Diagnosis not present

## 2017-04-12 DIAGNOSIS — N189 Chronic kidney disease, unspecified: Secondary | ICD-10-CM | POA: Diagnosis not present

## 2017-04-12 DIAGNOSIS — I87301 Chronic venous hypertension (idiopathic) without complications of right lower extremity: Secondary | ICD-10-CM | POA: Diagnosis not present

## 2017-04-13 ENCOUNTER — Encounter (HOSPITAL_BASED_OUTPATIENT_CLINIC_OR_DEPARTMENT_OTHER): Payer: Medicare HMO | Attending: Internal Medicine

## 2017-04-13 DIAGNOSIS — I890 Lymphedema, not elsewhere classified: Secondary | ICD-10-CM | POA: Insufficient documentation

## 2017-04-13 DIAGNOSIS — I87332 Chronic venous hypertension (idiopathic) with ulcer and inflammation of left lower extremity: Secondary | ICD-10-CM | POA: Diagnosis not present

## 2017-04-13 DIAGNOSIS — I87312 Chronic venous hypertension (idiopathic) with ulcer of left lower extremity: Secondary | ICD-10-CM | POA: Diagnosis not present

## 2017-04-13 DIAGNOSIS — L97822 Non-pressure chronic ulcer of other part of left lower leg with fat layer exposed: Secondary | ICD-10-CM | POA: Diagnosis not present

## 2017-04-13 DIAGNOSIS — I10 Essential (primary) hypertension: Secondary | ICD-10-CM | POA: Insufficient documentation

## 2017-04-16 DIAGNOSIS — I129 Hypertensive chronic kidney disease with stage 1 through stage 4 chronic kidney disease, or unspecified chronic kidney disease: Secondary | ICD-10-CM | POA: Diagnosis not present

## 2017-04-16 DIAGNOSIS — N189 Chronic kidney disease, unspecified: Secondary | ICD-10-CM | POA: Diagnosis not present

## 2017-04-16 DIAGNOSIS — I87332 Chronic venous hypertension (idiopathic) with ulcer and inflammation of left lower extremity: Secondary | ICD-10-CM | POA: Diagnosis not present

## 2017-04-16 DIAGNOSIS — L97221 Non-pressure chronic ulcer of left calf limited to breakdown of skin: Secondary | ICD-10-CM | POA: Diagnosis not present

## 2017-04-16 DIAGNOSIS — I87301 Chronic venous hypertension (idiopathic) without complications of right lower extremity: Secondary | ICD-10-CM | POA: Diagnosis not present

## 2017-04-16 DIAGNOSIS — I890 Lymphedema, not elsewhere classified: Secondary | ICD-10-CM | POA: Diagnosis not present

## 2017-04-18 DIAGNOSIS — I129 Hypertensive chronic kidney disease with stage 1 through stage 4 chronic kidney disease, or unspecified chronic kidney disease: Secondary | ICD-10-CM | POA: Diagnosis not present

## 2017-04-18 DIAGNOSIS — I87301 Chronic venous hypertension (idiopathic) without complications of right lower extremity: Secondary | ICD-10-CM | POA: Diagnosis not present

## 2017-04-18 DIAGNOSIS — I890 Lymphedema, not elsewhere classified: Secondary | ICD-10-CM | POA: Diagnosis not present

## 2017-04-18 DIAGNOSIS — N189 Chronic kidney disease, unspecified: Secondary | ICD-10-CM | POA: Diagnosis not present

## 2017-04-18 DIAGNOSIS — I87332 Chronic venous hypertension (idiopathic) with ulcer and inflammation of left lower extremity: Secondary | ICD-10-CM | POA: Diagnosis not present

## 2017-04-18 DIAGNOSIS — L97221 Non-pressure chronic ulcer of left calf limited to breakdown of skin: Secondary | ICD-10-CM | POA: Diagnosis not present

## 2017-04-20 DIAGNOSIS — I890 Lymphedema, not elsewhere classified: Secondary | ICD-10-CM | POA: Diagnosis not present

## 2017-04-20 DIAGNOSIS — I87332 Chronic venous hypertension (idiopathic) with ulcer and inflammation of left lower extremity: Secondary | ICD-10-CM | POA: Diagnosis not present

## 2017-04-20 DIAGNOSIS — L97822 Non-pressure chronic ulcer of other part of left lower leg with fat layer exposed: Secondary | ICD-10-CM | POA: Diagnosis not present

## 2017-04-20 DIAGNOSIS — I10 Essential (primary) hypertension: Secondary | ICD-10-CM | POA: Diagnosis not present

## 2017-04-20 DIAGNOSIS — I87312 Chronic venous hypertension (idiopathic) with ulcer of left lower extremity: Secondary | ICD-10-CM | POA: Diagnosis not present

## 2017-04-23 DIAGNOSIS — I87301 Chronic venous hypertension (idiopathic) without complications of right lower extremity: Secondary | ICD-10-CM | POA: Diagnosis not present

## 2017-04-23 DIAGNOSIS — I890 Lymphedema, not elsewhere classified: Secondary | ICD-10-CM | POA: Diagnosis not present

## 2017-04-23 DIAGNOSIS — I87332 Chronic venous hypertension (idiopathic) with ulcer and inflammation of left lower extremity: Secondary | ICD-10-CM | POA: Diagnosis not present

## 2017-04-23 DIAGNOSIS — I129 Hypertensive chronic kidney disease with stage 1 through stage 4 chronic kidney disease, or unspecified chronic kidney disease: Secondary | ICD-10-CM | POA: Diagnosis not present

## 2017-04-23 DIAGNOSIS — N189 Chronic kidney disease, unspecified: Secondary | ICD-10-CM | POA: Diagnosis not present

## 2017-04-23 DIAGNOSIS — L97221 Non-pressure chronic ulcer of left calf limited to breakdown of skin: Secondary | ICD-10-CM | POA: Diagnosis not present

## 2017-04-25 DIAGNOSIS — I87332 Chronic venous hypertension (idiopathic) with ulcer and inflammation of left lower extremity: Secondary | ICD-10-CM | POA: Diagnosis not present

## 2017-04-25 DIAGNOSIS — L97221 Non-pressure chronic ulcer of left calf limited to breakdown of skin: Secondary | ICD-10-CM | POA: Diagnosis not present

## 2017-04-25 DIAGNOSIS — I890 Lymphedema, not elsewhere classified: Secondary | ICD-10-CM | POA: Diagnosis not present

## 2017-04-25 DIAGNOSIS — I87301 Chronic venous hypertension (idiopathic) without complications of right lower extremity: Secondary | ICD-10-CM | POA: Diagnosis not present

## 2017-04-25 DIAGNOSIS — N189 Chronic kidney disease, unspecified: Secondary | ICD-10-CM | POA: Diagnosis not present

## 2017-04-25 DIAGNOSIS — I129 Hypertensive chronic kidney disease with stage 1 through stage 4 chronic kidney disease, or unspecified chronic kidney disease: Secondary | ICD-10-CM | POA: Diagnosis not present

## 2017-04-27 DIAGNOSIS — L97221 Non-pressure chronic ulcer of left calf limited to breakdown of skin: Secondary | ICD-10-CM | POA: Diagnosis not present

## 2017-04-27 DIAGNOSIS — I129 Hypertensive chronic kidney disease with stage 1 through stage 4 chronic kidney disease, or unspecified chronic kidney disease: Secondary | ICD-10-CM | POA: Diagnosis not present

## 2017-04-27 DIAGNOSIS — I87332 Chronic venous hypertension (idiopathic) with ulcer and inflammation of left lower extremity: Secondary | ICD-10-CM | POA: Diagnosis not present

## 2017-04-27 DIAGNOSIS — N189 Chronic kidney disease, unspecified: Secondary | ICD-10-CM | POA: Diagnosis not present

## 2017-04-27 DIAGNOSIS — I890 Lymphedema, not elsewhere classified: Secondary | ICD-10-CM | POA: Diagnosis not present

## 2017-04-27 DIAGNOSIS — I87301 Chronic venous hypertension (idiopathic) without complications of right lower extremity: Secondary | ICD-10-CM | POA: Diagnosis not present

## 2017-04-30 DIAGNOSIS — I129 Hypertensive chronic kidney disease with stage 1 through stage 4 chronic kidney disease, or unspecified chronic kidney disease: Secondary | ICD-10-CM | POA: Diagnosis not present

## 2017-04-30 DIAGNOSIS — I890 Lymphedema, not elsewhere classified: Secondary | ICD-10-CM | POA: Diagnosis not present

## 2017-04-30 DIAGNOSIS — N189 Chronic kidney disease, unspecified: Secondary | ICD-10-CM | POA: Diagnosis not present

## 2017-04-30 DIAGNOSIS — I87332 Chronic venous hypertension (idiopathic) with ulcer and inflammation of left lower extremity: Secondary | ICD-10-CM | POA: Diagnosis not present

## 2017-04-30 DIAGNOSIS — I87301 Chronic venous hypertension (idiopathic) without complications of right lower extremity: Secondary | ICD-10-CM | POA: Diagnosis not present

## 2017-04-30 DIAGNOSIS — L97221 Non-pressure chronic ulcer of left calf limited to breakdown of skin: Secondary | ICD-10-CM | POA: Diagnosis not present

## 2017-05-02 DIAGNOSIS — I129 Hypertensive chronic kidney disease with stage 1 through stage 4 chronic kidney disease, or unspecified chronic kidney disease: Secondary | ICD-10-CM | POA: Diagnosis not present

## 2017-05-02 DIAGNOSIS — I87301 Chronic venous hypertension (idiopathic) without complications of right lower extremity: Secondary | ICD-10-CM | POA: Diagnosis not present

## 2017-05-02 DIAGNOSIS — I890 Lymphedema, not elsewhere classified: Secondary | ICD-10-CM | POA: Diagnosis not present

## 2017-05-02 DIAGNOSIS — N189 Chronic kidney disease, unspecified: Secondary | ICD-10-CM | POA: Diagnosis not present

## 2017-05-02 DIAGNOSIS — I87332 Chronic venous hypertension (idiopathic) with ulcer and inflammation of left lower extremity: Secondary | ICD-10-CM | POA: Diagnosis not present

## 2017-05-02 DIAGNOSIS — L97221 Non-pressure chronic ulcer of left calf limited to breakdown of skin: Secondary | ICD-10-CM | POA: Diagnosis not present

## 2017-05-04 DIAGNOSIS — I87332 Chronic venous hypertension (idiopathic) with ulcer and inflammation of left lower extremity: Secondary | ICD-10-CM | POA: Diagnosis not present

## 2017-05-04 DIAGNOSIS — I890 Lymphedema, not elsewhere classified: Secondary | ICD-10-CM | POA: Diagnosis not present

## 2017-05-04 DIAGNOSIS — I10 Essential (primary) hypertension: Secondary | ICD-10-CM | POA: Diagnosis not present

## 2017-05-04 DIAGNOSIS — L97822 Non-pressure chronic ulcer of other part of left lower leg with fat layer exposed: Secondary | ICD-10-CM | POA: Diagnosis not present

## 2017-05-04 DIAGNOSIS — I87312 Chronic venous hypertension (idiopathic) with ulcer of left lower extremity: Secondary | ICD-10-CM | POA: Diagnosis not present

## 2017-05-07 ENCOUNTER — Other Ambulatory Visit: Payer: Self-pay | Admitting: Hematology and Oncology

## 2017-05-07 DIAGNOSIS — R921 Mammographic calcification found on diagnostic imaging of breast: Secondary | ICD-10-CM

## 2017-05-07 DIAGNOSIS — I87301 Chronic venous hypertension (idiopathic) without complications of right lower extremity: Secondary | ICD-10-CM | POA: Diagnosis not present

## 2017-05-07 DIAGNOSIS — I129 Hypertensive chronic kidney disease with stage 1 through stage 4 chronic kidney disease, or unspecified chronic kidney disease: Secondary | ICD-10-CM | POA: Diagnosis not present

## 2017-05-07 DIAGNOSIS — I890 Lymphedema, not elsewhere classified: Secondary | ICD-10-CM | POA: Diagnosis not present

## 2017-05-07 DIAGNOSIS — I87332 Chronic venous hypertension (idiopathic) with ulcer and inflammation of left lower extremity: Secondary | ICD-10-CM | POA: Diagnosis not present

## 2017-05-07 DIAGNOSIS — L97221 Non-pressure chronic ulcer of left calf limited to breakdown of skin: Secondary | ICD-10-CM | POA: Diagnosis not present

## 2017-05-07 DIAGNOSIS — N189 Chronic kidney disease, unspecified: Secondary | ICD-10-CM | POA: Diagnosis not present

## 2017-05-09 DIAGNOSIS — N189 Chronic kidney disease, unspecified: Secondary | ICD-10-CM | POA: Diagnosis not present

## 2017-05-09 DIAGNOSIS — I87301 Chronic venous hypertension (idiopathic) without complications of right lower extremity: Secondary | ICD-10-CM | POA: Diagnosis not present

## 2017-05-09 DIAGNOSIS — L97221 Non-pressure chronic ulcer of left calf limited to breakdown of skin: Secondary | ICD-10-CM | POA: Diagnosis not present

## 2017-05-09 DIAGNOSIS — I129 Hypertensive chronic kidney disease with stage 1 through stage 4 chronic kidney disease, or unspecified chronic kidney disease: Secondary | ICD-10-CM | POA: Diagnosis not present

## 2017-05-09 DIAGNOSIS — I890 Lymphedema, not elsewhere classified: Secondary | ICD-10-CM | POA: Diagnosis not present

## 2017-05-09 DIAGNOSIS — I87332 Chronic venous hypertension (idiopathic) with ulcer and inflammation of left lower extremity: Secondary | ICD-10-CM | POA: Diagnosis not present

## 2017-05-11 DIAGNOSIS — I890 Lymphedema, not elsewhere classified: Secondary | ICD-10-CM | POA: Diagnosis not present

## 2017-05-11 DIAGNOSIS — N189 Chronic kidney disease, unspecified: Secondary | ICD-10-CM | POA: Diagnosis not present

## 2017-05-11 DIAGNOSIS — I87332 Chronic venous hypertension (idiopathic) with ulcer and inflammation of left lower extremity: Secondary | ICD-10-CM | POA: Diagnosis not present

## 2017-05-11 DIAGNOSIS — I87301 Chronic venous hypertension (idiopathic) without complications of right lower extremity: Secondary | ICD-10-CM | POA: Diagnosis not present

## 2017-05-11 DIAGNOSIS — L97221 Non-pressure chronic ulcer of left calf limited to breakdown of skin: Secondary | ICD-10-CM | POA: Diagnosis not present

## 2017-05-11 DIAGNOSIS — I129 Hypertensive chronic kidney disease with stage 1 through stage 4 chronic kidney disease, or unspecified chronic kidney disease: Secondary | ICD-10-CM | POA: Diagnosis not present

## 2017-05-14 ENCOUNTER — Telehealth: Payer: Self-pay | Admitting: Hematology and Oncology

## 2017-05-14 DIAGNOSIS — I87332 Chronic venous hypertension (idiopathic) with ulcer and inflammation of left lower extremity: Secondary | ICD-10-CM | POA: Diagnosis not present

## 2017-05-14 DIAGNOSIS — I87301 Chronic venous hypertension (idiopathic) without complications of right lower extremity: Secondary | ICD-10-CM | POA: Diagnosis not present

## 2017-05-14 DIAGNOSIS — N189 Chronic kidney disease, unspecified: Secondary | ICD-10-CM | POA: Diagnosis not present

## 2017-05-14 DIAGNOSIS — I129 Hypertensive chronic kidney disease with stage 1 through stage 4 chronic kidney disease, or unspecified chronic kidney disease: Secondary | ICD-10-CM | POA: Diagnosis not present

## 2017-05-14 DIAGNOSIS — I890 Lymphedema, not elsewhere classified: Secondary | ICD-10-CM | POA: Diagnosis not present

## 2017-05-14 DIAGNOSIS — L97221 Non-pressure chronic ulcer of left calf limited to breakdown of skin: Secondary | ICD-10-CM | POA: Diagnosis not present

## 2017-05-14 NOTE — Telephone Encounter (Signed)
Tried to call patient regarding voicemail °

## 2017-05-16 ENCOUNTER — Telehealth: Payer: Self-pay

## 2017-05-16 DIAGNOSIS — L97221 Non-pressure chronic ulcer of left calf limited to breakdown of skin: Secondary | ICD-10-CM | POA: Diagnosis not present

## 2017-05-16 DIAGNOSIS — I87301 Chronic venous hypertension (idiopathic) without complications of right lower extremity: Secondary | ICD-10-CM | POA: Diagnosis not present

## 2017-05-16 DIAGNOSIS — I129 Hypertensive chronic kidney disease with stage 1 through stage 4 chronic kidney disease, or unspecified chronic kidney disease: Secondary | ICD-10-CM | POA: Diagnosis not present

## 2017-05-16 DIAGNOSIS — I87332 Chronic venous hypertension (idiopathic) with ulcer and inflammation of left lower extremity: Secondary | ICD-10-CM | POA: Diagnosis not present

## 2017-05-16 DIAGNOSIS — I890 Lymphedema, not elsewhere classified: Secondary | ICD-10-CM | POA: Diagnosis not present

## 2017-05-16 DIAGNOSIS — N189 Chronic kidney disease, unspecified: Secondary | ICD-10-CM | POA: Diagnosis not present

## 2017-05-16 NOTE — Telephone Encounter (Signed)
Left a message for patient to return call as soon as she could to R/S due to conflicting appointment. Per 3/6 phon e message return.

## 2017-05-18 ENCOUNTER — Encounter (HOSPITAL_BASED_OUTPATIENT_CLINIC_OR_DEPARTMENT_OTHER): Payer: Medicare HMO | Attending: Internal Medicine

## 2017-05-18 DIAGNOSIS — Z09 Encounter for follow-up examination after completed treatment for conditions other than malignant neoplasm: Secondary | ICD-10-CM | POA: Insufficient documentation

## 2017-05-18 DIAGNOSIS — Z872 Personal history of diseases of the skin and subcutaneous tissue: Secondary | ICD-10-CM | POA: Diagnosis not present

## 2017-05-18 DIAGNOSIS — I872 Venous insufficiency (chronic) (peripheral): Secondary | ICD-10-CM | POA: Insufficient documentation

## 2017-05-18 DIAGNOSIS — S81802A Unspecified open wound, left lower leg, initial encounter: Secondary | ICD-10-CM | POA: Diagnosis not present

## 2017-05-18 DIAGNOSIS — I890 Lymphedema, not elsewhere classified: Secondary | ICD-10-CM | POA: Diagnosis not present

## 2017-05-18 DIAGNOSIS — I10 Essential (primary) hypertension: Secondary | ICD-10-CM | POA: Diagnosis not present

## 2017-05-21 DIAGNOSIS — I129 Hypertensive chronic kidney disease with stage 1 through stage 4 chronic kidney disease, or unspecified chronic kidney disease: Secondary | ICD-10-CM | POA: Diagnosis not present

## 2017-05-21 DIAGNOSIS — L97221 Non-pressure chronic ulcer of left calf limited to breakdown of skin: Secondary | ICD-10-CM | POA: Diagnosis not present

## 2017-05-21 DIAGNOSIS — I890 Lymphedema, not elsewhere classified: Secondary | ICD-10-CM | POA: Diagnosis not present

## 2017-05-21 DIAGNOSIS — I87332 Chronic venous hypertension (idiopathic) with ulcer and inflammation of left lower extremity: Secondary | ICD-10-CM | POA: Diagnosis not present

## 2017-05-21 DIAGNOSIS — I87301 Chronic venous hypertension (idiopathic) without complications of right lower extremity: Secondary | ICD-10-CM | POA: Diagnosis not present

## 2017-05-21 DIAGNOSIS — N189 Chronic kidney disease, unspecified: Secondary | ICD-10-CM | POA: Diagnosis not present

## 2017-05-28 DIAGNOSIS — I87301 Chronic venous hypertension (idiopathic) without complications of right lower extremity: Secondary | ICD-10-CM | POA: Diagnosis not present

## 2017-05-28 DIAGNOSIS — N189 Chronic kidney disease, unspecified: Secondary | ICD-10-CM | POA: Diagnosis not present

## 2017-05-28 DIAGNOSIS — L97221 Non-pressure chronic ulcer of left calf limited to breakdown of skin: Secondary | ICD-10-CM | POA: Diagnosis not present

## 2017-05-28 DIAGNOSIS — I87332 Chronic venous hypertension (idiopathic) with ulcer and inflammation of left lower extremity: Secondary | ICD-10-CM | POA: Diagnosis not present

## 2017-05-28 DIAGNOSIS — I129 Hypertensive chronic kidney disease with stage 1 through stage 4 chronic kidney disease, or unspecified chronic kidney disease: Secondary | ICD-10-CM | POA: Diagnosis not present

## 2017-05-28 DIAGNOSIS — I890 Lymphedema, not elsewhere classified: Secondary | ICD-10-CM | POA: Diagnosis not present

## 2017-06-06 DIAGNOSIS — I87301 Chronic venous hypertension (idiopathic) without complications of right lower extremity: Secondary | ICD-10-CM | POA: Diagnosis not present

## 2017-06-06 DIAGNOSIS — I87332 Chronic venous hypertension (idiopathic) with ulcer and inflammation of left lower extremity: Secondary | ICD-10-CM | POA: Diagnosis not present

## 2017-06-06 DIAGNOSIS — L97221 Non-pressure chronic ulcer of left calf limited to breakdown of skin: Secondary | ICD-10-CM | POA: Diagnosis not present

## 2017-06-06 DIAGNOSIS — I890 Lymphedema, not elsewhere classified: Secondary | ICD-10-CM | POA: Diagnosis not present

## 2017-06-06 DIAGNOSIS — N189 Chronic kidney disease, unspecified: Secondary | ICD-10-CM | POA: Diagnosis not present

## 2017-06-06 DIAGNOSIS — I129 Hypertensive chronic kidney disease with stage 1 through stage 4 chronic kidney disease, or unspecified chronic kidney disease: Secondary | ICD-10-CM | POA: Diagnosis not present

## 2017-06-13 DIAGNOSIS — Z6826 Body mass index (BMI) 26.0-26.9, adult: Secondary | ICD-10-CM | POA: Diagnosis not present

## 2017-06-13 DIAGNOSIS — Z789 Other specified health status: Secondary | ICD-10-CM | POA: Diagnosis not present

## 2017-06-13 DIAGNOSIS — N183 Chronic kidney disease, stage 3 (moderate): Secondary | ICD-10-CM | POA: Diagnosis not present

## 2017-06-13 DIAGNOSIS — Z299 Encounter for prophylactic measures, unspecified: Secondary | ICD-10-CM | POA: Diagnosis not present

## 2017-06-13 DIAGNOSIS — I10 Essential (primary) hypertension: Secondary | ICD-10-CM | POA: Diagnosis not present

## 2017-06-13 DIAGNOSIS — I8390 Asymptomatic varicose veins of unspecified lower extremity: Secondary | ICD-10-CM | POA: Diagnosis not present

## 2017-07-24 ENCOUNTER — Telehealth: Payer: Self-pay | Admitting: Hematology and Oncology

## 2017-07-24 ENCOUNTER — Encounter: Payer: Self-pay | Admitting: Hematology and Oncology

## 2017-07-24 ENCOUNTER — Inpatient Hospital Stay: Payer: Medicare HMO | Attending: Hematology and Oncology | Admitting: Hematology and Oncology

## 2017-07-24 DIAGNOSIS — Z170 Estrogen receptor positive status [ER+]: Secondary | ICD-10-CM | POA: Insufficient documentation

## 2017-07-24 DIAGNOSIS — C50212 Malignant neoplasm of upper-inner quadrant of left female breast: Secondary | ICD-10-CM | POA: Diagnosis not present

## 2017-07-24 DIAGNOSIS — Z79811 Long term (current) use of aromatase inhibitors: Secondary | ICD-10-CM | POA: Diagnosis not present

## 2017-07-24 DIAGNOSIS — Z79899 Other long term (current) drug therapy: Secondary | ICD-10-CM | POA: Insufficient documentation

## 2017-07-24 DIAGNOSIS — Z7982 Long term (current) use of aspirin: Secondary | ICD-10-CM | POA: Insufficient documentation

## 2017-07-24 NOTE — Telephone Encounter (Signed)
Gave patient AVs and calendar of upcoming may 2020 appointments °

## 2017-07-24 NOTE — Progress Notes (Signed)
Patient Care Team: [name], NP as PCP - General (Nurse Practitioner) Jovita Kussmaul, MD as Consulting Physician (General Surgery) Nicholas Lose, MD as Consulting Physician (Hematology and Oncology) Delice Bison Charlestine Massed, NP as Nurse Practitioner (Hematology and Oncology)  DIAGNOSIS:  Encounter Diagnosis  Name Primary?  . Malignant neoplasm of upper-inner quadrant of left breast in female, estrogen receptor positive ([place])     SUMMARY OF ONCOLOGIC HISTORY:   Breast cancer of upper-inner quadrant of left female breast ([place])   08/06/2015 Initial Biopsy    (L) breast needle biopsy (UOQ): [place] with fibrocystic changes and associated microcalcs.       10/07/2015 Surgery    Left lumpectomy Marlou Starks): Invasive lobular cancer grade 1, 0.5 cm, LCIS with calcifications, focally present at superior margin, ER 95% PR 0%, her-2 neg, Ki-67 2% T1 aN0 stage IA pathologic staging       Radiation Therapy    No adjuvant radiation given advanced age.       10/29/2015 -  Anti-estrogen oral therapy    Anastrozole 1 mg daily      11/22/2015 Surgery    Left breast re-excision Marlou Starks): No residual cancer identified, margins negative.        CHIEF COMPLIANT: Follow-up on anastrozole therapy  INTERVAL HISTORY: Erin [name] is a 84 year old with above-mentioned history of left breast cancer treated with lumpectomy and is currently on antiestrogen therapy with anastrozole.  She appears to be tolerating anastrozole extremely well.  She denies any hot flashes or myalgias.  Denies any lumps or nodules in the breast.  REVIEW OF SYSTEMS:   Constitutional: Denies fevers, chills or abnormal weight loss Eyes: Denies blurriness of vision Ears, nose, mouth, throat, and face: Denies mucositis or sore throat Respiratory: Denies cough, dyspnea or wheezes Cardiovascular: Denies palpitation, chest discomfort Gastrointestinal:  Denies nausea, heartburn or change in bowel habits Skin: Denies abnormal skin  rashes Lymphatics: Denies new lymphadenopathy or easy bruising Neurological:Denies numbness, tingling or new weaknesses Behavioral/Psych: Mood is stable, no new changes  Extremities: No lower extremity edema Breast:  denies any pain or lumps or nodules in either breasts All other systems were reviewed with the patient and are negative.  I have reviewed the past medical history, past surgical history, social history and family history with the patient and they are unchanged from previous note.  ALLERGIES:  has No Known Allergies.  MEDICATIONS:  Current Outpatient Medications  Medication Sig Dispense Refill  . anastrozole (ARIMIDEX) 1 MG tablet Take 1 tablet (1 mg total) by mouth daily. 90 tablet 3  . anastrozole (ARIMIDEX) 1 MG tablet TAKE ONE TABLET BY MOUTH DAILY 90 tablet 3  . Ascorbic Acid (VITAMIN C) 100 MG tablet Take 100 mg by mouth daily.    Marland Kitchen aspirin 81 MG tablet Take 81 mg by mouth daily.    . benazepril (LOTENSIN) 20 MG tablet     . Calcium Carbonate Antacid (TUMS PO) Take by mouth.    . Calcium-Vitamin D-Vitamin K (VIACTIV PO) Take by mouth.    Marland Kitchen HYDROcodone-acetaminophen (NORCO/VICODIN) 5-325 MG tablet Take 1-2 tablets by mouth every 4 (four) hours as needed for moderate pain or severe pain. 30 tablet 0  . ibuprofen (ADVIL,MOTRIN) 200 MG tablet Take 200 mg by mouth every 6 (six) hours as needed.    Marland Kitchen LUMIGAN 0.01 % SOLN     . Omega-3 Fatty Acids (FISH OIL PO) Take 600 mg by mouth.    . Red Yeast Rice Extract (RED YEAST RICE  PO) Take 2 tablets by mouth.    . Triamterene-HCTZ (MAXZIDE PO) Take 75 mg by mouth.    . vitamin E 100 UNIT capsule Take by mouth daily.     No current facility-administered medications for this visit.     PHYSICAL EXAMINATION: ECOG PERFORMANCE STATUS: 0 - Asymptomatic  There were no vitals filed for this visit. There were no vitals filed for this visit.  GENERAL:alert, no distress and comfortable SKIN: skin color, texture, turgor are normal,  no rashes or significant lesions EYES: normal, Conjunctiva are pink and non-injected, sclera clear OROPHARYNX:no exudate, no erythema and lips, buccal mucosa, and tongue normal  NECK: supple, thyroid normal size, non-tender, without nodularity LYMPH:  no palpable lymphadenopathy in the cervical, axillary or inguinal LUNGS: clear to auscultation and percussion with normal breathing effort HEART: regular rate & rhythm and no murmurs and no lower extremity edema ABDOMEN:abdomen soft, non-tender and normal bowel sounds MUSCULOSKELETAL:no cyanosis of digits and no clubbing  NEURO: alert & oriented x 3 with fluent speech, no focal motor/sensory deficits EXTREMITIES: No lower extremity edema BREAST: No palpable masses or nodules in either right or left breasts. No palpable axillary supraclavicular or infraclavicular adenopathy no breast tenderness or nipple discharge. (exam performed in the presence of a chaperone)  LABORATORY DATA:  I have reviewed the data as listed CMP Latest Ref Rng & Units 11/18/2015 10/05/2015  Glucose 65 - 99 mg/dL 109(H) 111(H)  BUN 6 - 20 mg/dL 32(H) 47(H)  Creatinine 0.44 - 1.00 mg/dL 1.10(H) 1.12(H)  Sodium 135 - 145 mmol/L 138 139  Potassium 3.5 - 5.1 mmol/L 4.6 5.3(H)  Chloride 101 - 111 mmol/L 106 113(H)  CO2 22 - 32 mmol/L 22 20(L)  Calcium 8.9 - 10.3 mg/dL 9.8 9.3    No results found for: WBC, HGB, HCT, MCV, PLT, NEUTROABS  ASSESSMENT & PLAN:  Breast cancer of upper-inner quadrant of left female breast ([place]) Left lumpectomy 10/07/2015: Invasive lobular cancer grade 1, 0.5 cm, LCIS with calcifications, focally present at superior margin, ER 95% PR 0%, her-2 neg, Ki-67 2% T1 aN0 stage IA pathologic staging Left breast Re-excision 11/22/2015: No residual cancer identified Did not need adjuvant radiation therapy given her advanced age  Current treatment: Anastrozole 1 mg by mouth daily started 10/28/2015  Anastrozole toxicities:No side effects  anastrozole.  Breast cancer surveillance: 1.  Breast exam 07/24/2017: Benign  2. mammogram scheduled for 08/20/2017  Return to clinic in 1 year for follow-up      No orders of the defined types were placed in this encounter.  The patient has a good understanding of the overall plan. she agrees with it. she will call with any problems that may develop before the next visit here.   Harriette Ohara, MD 07/24/17

## 2017-07-24 NOTE — Assessment & Plan Note (Signed)
Left lumpectomy 10/07/2015: Invasive lobular cancer grade 1, 0.5 cm, LCIS with calcifications, focally present at superior margin, ER 95% PR 0%, her-2 neg, Ki-67 2% T1 aN0 stage IA pathologic staging Left breast Re-excision 11/22/2015: No residual cancer identified Did not need adjuvant radiation therapy given her advanced age  Current treatment: Anastrozole 1 mg by mouth daily started 10/28/2015  Anastrozole toxicities:No side effects anastrozole.  Breast cancer surveillance: 1.  Breast exam 07/24/2017: Benign  2. mammogram scheduled for 08/20/2017  Return to clinic in 1 year for follow-up   

## 2017-07-26 DIAGNOSIS — H401133 Primary open-angle glaucoma, bilateral, severe stage: Secondary | ICD-10-CM | POA: Diagnosis not present

## 2017-08-20 ENCOUNTER — Ambulatory Visit
Admission: RE | Admit: 2017-08-20 | Discharge: 2017-08-20 | Disposition: A | Discharge status: Home / Self Care | Payer: Medicare HMO | Source: Ambulatory Visit | Attending: Hematology and Oncology | Admitting: Hematology and Oncology

## 2017-08-20 DIAGNOSIS — R928 Other abnormal and inconclusive findings on diagnostic imaging of breast: Secondary | ICD-10-CM | POA: Diagnosis not present

## 2017-08-20 DIAGNOSIS — R921 Mammographic calcification found on diagnostic imaging of breast: Secondary | ICD-10-CM

## 2017-12-19 DIAGNOSIS — Z23 Encounter for immunization: Secondary | ICD-10-CM | POA: Diagnosis not present

## 2017-12-19 DIAGNOSIS — H903 Sensorineural hearing loss, bilateral: Secondary | ICD-10-CM | POA: Diagnosis not present

## 2017-12-19 DIAGNOSIS — H6123 Impacted cerumen, bilateral: Secondary | ICD-10-CM | POA: Diagnosis not present

## 2018-01-08 DIAGNOSIS — I10 Essential (primary) hypertension: Secondary | ICD-10-CM | POA: Diagnosis not present

## 2018-01-08 DIAGNOSIS — Z299 Encounter for prophylactic measures, unspecified: Secondary | ICD-10-CM | POA: Diagnosis not present

## 2018-01-08 DIAGNOSIS — Z6826 Body mass index (BMI) 26.0-26.9, adult: Secondary | ICD-10-CM | POA: Diagnosis not present

## 2018-02-12 DIAGNOSIS — Z0000 Encounter for general adult medical examination without abnormal findings: Secondary | ICD-10-CM | POA: Diagnosis not present

## 2018-02-12 DIAGNOSIS — Z79899 Other long term (current) drug therapy: Secondary | ICD-10-CM | POA: Diagnosis not present

## 2018-02-12 DIAGNOSIS — Z299 Encounter for prophylactic measures, unspecified: Secondary | ICD-10-CM | POA: Diagnosis not present

## 2018-02-12 DIAGNOSIS — Z1331 Encounter for screening for depression: Secondary | ICD-10-CM | POA: Diagnosis not present

## 2018-02-12 DIAGNOSIS — Z6826 Body mass index (BMI) 26.0-26.9, adult: Secondary | ICD-10-CM | POA: Diagnosis not present

## 2018-02-12 DIAGNOSIS — E7800 Pure hypercholesterolemia, unspecified: Secondary | ICD-10-CM | POA: Diagnosis not present

## 2018-02-12 DIAGNOSIS — Z7189 Other specified counseling: Secondary | ICD-10-CM | POA: Diagnosis not present

## 2018-02-12 DIAGNOSIS — R5383 Other fatigue: Secondary | ICD-10-CM | POA: Diagnosis not present

## 2018-02-12 DIAGNOSIS — E559 Vitamin D deficiency, unspecified: Secondary | ICD-10-CM | POA: Diagnosis not present

## 2018-02-12 DIAGNOSIS — Z1211 Encounter for screening for malignant neoplasm of colon: Secondary | ICD-10-CM | POA: Diagnosis not present

## 2018-02-12 DIAGNOSIS — Z1339 Encounter for screening examination for other mental health and behavioral disorders: Secondary | ICD-10-CM | POA: Diagnosis not present

## 2018-02-20 ENCOUNTER — Other Ambulatory Visit: Payer: Self-pay | Admitting: Hematology and Oncology

## 2018-05-13 DIAGNOSIS — N189 Chronic kidney disease, unspecified: Secondary | ICD-10-CM | POA: Diagnosis not present

## 2018-05-13 DIAGNOSIS — R5381 Other malaise: Secondary | ICD-10-CM | POA: Diagnosis not present

## 2018-05-13 DIAGNOSIS — L03115 Cellulitis of right lower limb: Secondary | ICD-10-CM | POA: Diagnosis not present

## 2018-05-13 DIAGNOSIS — I10 Essential (primary) hypertension: Secondary | ICD-10-CM | POA: Diagnosis not present

## 2018-05-13 DIAGNOSIS — Z7982 Long term (current) use of aspirin: Secondary | ICD-10-CM | POA: Diagnosis not present

## 2018-05-13 DIAGNOSIS — I87333 Chronic venous hypertension (idiopathic) with ulcer and inflammation of bilateral lower extremity: Secondary | ICD-10-CM | POA: Diagnosis not present

## 2018-05-13 DIAGNOSIS — R279 Unspecified lack of coordination: Secondary | ICD-10-CM | POA: Diagnosis not present

## 2018-05-13 DIAGNOSIS — L03116 Cellulitis of left lower limb: Secondary | ICD-10-CM | POA: Diagnosis not present

## 2018-05-13 DIAGNOSIS — L89894 Pressure ulcer of other site, stage 4: Secondary | ICD-10-CM | POA: Diagnosis not present

## 2018-05-13 DIAGNOSIS — I739 Peripheral vascular disease, unspecified: Secondary | ICD-10-CM | POA: Diagnosis not present

## 2018-05-13 DIAGNOSIS — Z881 Allergy status to other antibiotic agents status: Secondary | ICD-10-CM | POA: Diagnosis not present

## 2018-05-13 DIAGNOSIS — L97902 Non-pressure chronic ulcer of unspecified part of unspecified lower leg with fat layer exposed: Secondary | ICD-10-CM | POA: Diagnosis not present

## 2018-05-13 DIAGNOSIS — Z853 Personal history of malignant neoplasm of breast: Secondary | ICD-10-CM | POA: Diagnosis not present

## 2018-05-13 DIAGNOSIS — R609 Edema, unspecified: Secondary | ICD-10-CM | POA: Diagnosis not present

## 2018-05-13 DIAGNOSIS — Z79899 Other long term (current) drug therapy: Secondary | ICD-10-CM | POA: Diagnosis not present

## 2018-05-13 DIAGNOSIS — R269 Unspecified abnormalities of gait and mobility: Secondary | ICD-10-CM | POA: Diagnosis not present

## 2018-05-13 DIAGNOSIS — C50919 Malignant neoplasm of unspecified site of unspecified female breast: Secondary | ICD-10-CM | POA: Diagnosis not present

## 2018-05-13 DIAGNOSIS — I129 Hypertensive chronic kidney disease with stage 1 through stage 4 chronic kidney disease, or unspecified chronic kidney disease: Secondary | ICD-10-CM | POA: Diagnosis not present

## 2018-05-13 DIAGNOSIS — E7800 Pure hypercholesterolemia, unspecified: Secondary | ICD-10-CM | POA: Diagnosis not present

## 2018-05-13 DIAGNOSIS — Z743 Need for continuous supervision: Secondary | ICD-10-CM | POA: Diagnosis not present

## 2018-05-13 DIAGNOSIS — L89614 Pressure ulcer of right heel, stage 4: Secondary | ICD-10-CM | POA: Diagnosis not present

## 2018-05-13 DIAGNOSIS — Z79811 Long term (current) use of aromatase inhibitors: Secondary | ICD-10-CM | POA: Diagnosis not present

## 2018-05-13 DIAGNOSIS — Z7984 Long term (current) use of oral hypoglycemic drugs: Secondary | ICD-10-CM | POA: Diagnosis not present

## 2018-05-16 DIAGNOSIS — R739 Hyperglycemia, unspecified: Secondary | ICD-10-CM | POA: Diagnosis not present

## 2018-05-16 DIAGNOSIS — Z299 Encounter for prophylactic measures, unspecified: Secondary | ICD-10-CM | POA: Diagnosis not present

## 2018-05-16 DIAGNOSIS — L03119 Cellulitis of unspecified part of limb: Secondary | ICD-10-CM | POA: Diagnosis not present

## 2018-05-16 DIAGNOSIS — L98499 Non-pressure chronic ulcer of skin of other sites with unspecified severity: Secondary | ICD-10-CM | POA: Diagnosis not present

## 2018-05-16 DIAGNOSIS — Z789 Other specified health status: Secondary | ICD-10-CM | POA: Diagnosis not present

## 2018-05-16 DIAGNOSIS — L02419 Cutaneous abscess of limb, unspecified: Secondary | ICD-10-CM | POA: Diagnosis not present

## 2018-05-16 DIAGNOSIS — I10 Essential (primary) hypertension: Secondary | ICD-10-CM | POA: Diagnosis not present

## 2018-05-16 DIAGNOSIS — Z6826 Body mass index (BMI) 26.0-26.9, adult: Secondary | ICD-10-CM | POA: Diagnosis not present

## 2018-05-22 DIAGNOSIS — S81802S Unspecified open wound, left lower leg, sequela: Secondary | ICD-10-CM | POA: Diagnosis not present

## 2018-05-22 DIAGNOSIS — L03115 Cellulitis of right lower limb: Secondary | ICD-10-CM | POA: Diagnosis not present

## 2018-05-22 DIAGNOSIS — E1122 Type 2 diabetes mellitus with diabetic chronic kidney disease: Secondary | ICD-10-CM | POA: Diagnosis not present

## 2018-05-22 DIAGNOSIS — I129 Hypertensive chronic kidney disease with stage 1 through stage 4 chronic kidney disease, or unspecified chronic kidney disease: Secondary | ICD-10-CM | POA: Diagnosis not present

## 2018-05-22 DIAGNOSIS — N183 Chronic kidney disease, stage 3 (moderate): Secondary | ICD-10-CM | POA: Diagnosis not present

## 2018-05-22 DIAGNOSIS — Z6826 Body mass index (BMI) 26.0-26.9, adult: Secondary | ICD-10-CM | POA: Diagnosis not present

## 2018-05-22 DIAGNOSIS — L02416 Cutaneous abscess of left lower limb: Secondary | ICD-10-CM | POA: Diagnosis not present

## 2018-05-22 DIAGNOSIS — L02415 Cutaneous abscess of right lower limb: Secondary | ICD-10-CM | POA: Diagnosis not present

## 2018-05-22 DIAGNOSIS — M545 Low back pain: Secondary | ICD-10-CM | POA: Diagnosis not present

## 2018-05-22 DIAGNOSIS — L03116 Cellulitis of left lower limb: Secondary | ICD-10-CM | POA: Diagnosis not present

## 2018-05-24 DIAGNOSIS — I129 Hypertensive chronic kidney disease with stage 1 through stage 4 chronic kidney disease, or unspecified chronic kidney disease: Secondary | ICD-10-CM | POA: Diagnosis not present

## 2018-05-24 DIAGNOSIS — L03115 Cellulitis of right lower limb: Secondary | ICD-10-CM | POA: Diagnosis not present

## 2018-05-24 DIAGNOSIS — Z6826 Body mass index (BMI) 26.0-26.9, adult: Secondary | ICD-10-CM | POA: Diagnosis not present

## 2018-05-24 DIAGNOSIS — M545 Low back pain: Secondary | ICD-10-CM | POA: Diagnosis not present

## 2018-05-24 DIAGNOSIS — L02415 Cutaneous abscess of right lower limb: Secondary | ICD-10-CM | POA: Diagnosis not present

## 2018-05-24 DIAGNOSIS — N183 Chronic kidney disease, stage 3 (moderate): Secondary | ICD-10-CM | POA: Diagnosis not present

## 2018-05-24 DIAGNOSIS — L03116 Cellulitis of left lower limb: Secondary | ICD-10-CM | POA: Diagnosis not present

## 2018-05-24 DIAGNOSIS — S81802S Unspecified open wound, left lower leg, sequela: Secondary | ICD-10-CM | POA: Diagnosis not present

## 2018-05-24 DIAGNOSIS — L02416 Cutaneous abscess of left lower limb: Secondary | ICD-10-CM | POA: Diagnosis not present

## 2018-05-24 DIAGNOSIS — E1122 Type 2 diabetes mellitus with diabetic chronic kidney disease: Secondary | ICD-10-CM | POA: Diagnosis not present

## 2018-05-27 DIAGNOSIS — I129 Hypertensive chronic kidney disease with stage 1 through stage 4 chronic kidney disease, or unspecified chronic kidney disease: Secondary | ICD-10-CM | POA: Diagnosis not present

## 2018-05-27 DIAGNOSIS — L03116 Cellulitis of left lower limb: Secondary | ICD-10-CM | POA: Diagnosis not present

## 2018-05-27 DIAGNOSIS — N183 Chronic kidney disease, stage 3 (moderate): Secondary | ICD-10-CM | POA: Diagnosis not present

## 2018-05-27 DIAGNOSIS — M545 Low back pain: Secondary | ICD-10-CM | POA: Diagnosis not present

## 2018-05-27 DIAGNOSIS — L02416 Cutaneous abscess of left lower limb: Secondary | ICD-10-CM | POA: Diagnosis not present

## 2018-05-27 DIAGNOSIS — L02415 Cutaneous abscess of right lower limb: Secondary | ICD-10-CM | POA: Diagnosis not present

## 2018-05-27 DIAGNOSIS — E1122 Type 2 diabetes mellitus with diabetic chronic kidney disease: Secondary | ICD-10-CM | POA: Diagnosis not present

## 2018-05-27 DIAGNOSIS — L03115 Cellulitis of right lower limb: Secondary | ICD-10-CM | POA: Diagnosis not present

## 2018-05-27 DIAGNOSIS — Z6826 Body mass index (BMI) 26.0-26.9, adult: Secondary | ICD-10-CM | POA: Diagnosis not present

## 2018-05-27 DIAGNOSIS — S81802S Unspecified open wound, left lower leg, sequela: Secondary | ICD-10-CM | POA: Diagnosis not present

## 2018-05-29 DIAGNOSIS — S81802S Unspecified open wound, left lower leg, sequela: Secondary | ICD-10-CM | POA: Diagnosis not present

## 2018-05-29 DIAGNOSIS — I129 Hypertensive chronic kidney disease with stage 1 through stage 4 chronic kidney disease, or unspecified chronic kidney disease: Secondary | ICD-10-CM | POA: Diagnosis not present

## 2018-05-29 DIAGNOSIS — L02415 Cutaneous abscess of right lower limb: Secondary | ICD-10-CM | POA: Diagnosis not present

## 2018-05-29 DIAGNOSIS — L03115 Cellulitis of right lower limb: Secondary | ICD-10-CM | POA: Diagnosis not present

## 2018-05-29 DIAGNOSIS — E1122 Type 2 diabetes mellitus with diabetic chronic kidney disease: Secondary | ICD-10-CM | POA: Diagnosis not present

## 2018-05-29 DIAGNOSIS — L02416 Cutaneous abscess of left lower limb: Secondary | ICD-10-CM | POA: Diagnosis not present

## 2018-05-29 DIAGNOSIS — M545 Low back pain: Secondary | ICD-10-CM | POA: Diagnosis not present

## 2018-05-29 DIAGNOSIS — Z6826 Body mass index (BMI) 26.0-26.9, adult: Secondary | ICD-10-CM | POA: Diagnosis not present

## 2018-05-29 DIAGNOSIS — N183 Chronic kidney disease, stage 3 (moderate): Secondary | ICD-10-CM | POA: Diagnosis not present

## 2018-05-29 DIAGNOSIS — L03116 Cellulitis of left lower limb: Secondary | ICD-10-CM | POA: Diagnosis not present

## 2018-06-03 DIAGNOSIS — E1122 Type 2 diabetes mellitus with diabetic chronic kidney disease: Secondary | ICD-10-CM | POA: Diagnosis not present

## 2018-06-03 DIAGNOSIS — L02416 Cutaneous abscess of left lower limb: Secondary | ICD-10-CM | POA: Diagnosis not present

## 2018-06-03 DIAGNOSIS — L03115 Cellulitis of right lower limb: Secondary | ICD-10-CM | POA: Diagnosis not present

## 2018-06-03 DIAGNOSIS — S81802S Unspecified open wound, left lower leg, sequela: Secondary | ICD-10-CM | POA: Diagnosis not present

## 2018-06-03 DIAGNOSIS — I129 Hypertensive chronic kidney disease with stage 1 through stage 4 chronic kidney disease, or unspecified chronic kidney disease: Secondary | ICD-10-CM | POA: Diagnosis not present

## 2018-06-03 DIAGNOSIS — N183 Chronic kidney disease, stage 3 (moderate): Secondary | ICD-10-CM | POA: Diagnosis not present

## 2018-06-03 DIAGNOSIS — L02415 Cutaneous abscess of right lower limb: Secondary | ICD-10-CM | POA: Diagnosis not present

## 2018-06-03 DIAGNOSIS — M545 Low back pain: Secondary | ICD-10-CM | POA: Diagnosis not present

## 2018-06-03 DIAGNOSIS — L03116 Cellulitis of left lower limb: Secondary | ICD-10-CM | POA: Diagnosis not present

## 2018-06-03 DIAGNOSIS — Z6826 Body mass index (BMI) 26.0-26.9, adult: Secondary | ICD-10-CM | POA: Diagnosis not present

## 2018-06-06 DIAGNOSIS — L02416 Cutaneous abscess of left lower limb: Secondary | ICD-10-CM | POA: Diagnosis not present

## 2018-06-06 DIAGNOSIS — N183 Chronic kidney disease, stage 3 (moderate): Secondary | ICD-10-CM | POA: Diagnosis not present

## 2018-06-06 DIAGNOSIS — L03116 Cellulitis of left lower limb: Secondary | ICD-10-CM | POA: Diagnosis not present

## 2018-06-06 DIAGNOSIS — I129 Hypertensive chronic kidney disease with stage 1 through stage 4 chronic kidney disease, or unspecified chronic kidney disease: Secondary | ICD-10-CM | POA: Diagnosis not present

## 2018-06-06 DIAGNOSIS — M545 Low back pain: Secondary | ICD-10-CM | POA: Diagnosis not present

## 2018-06-06 DIAGNOSIS — E1122 Type 2 diabetes mellitus with diabetic chronic kidney disease: Secondary | ICD-10-CM | POA: Diagnosis not present

## 2018-06-06 DIAGNOSIS — L02415 Cutaneous abscess of right lower limb: Secondary | ICD-10-CM | POA: Diagnosis not present

## 2018-06-06 DIAGNOSIS — L03115 Cellulitis of right lower limb: Secondary | ICD-10-CM | POA: Diagnosis not present

## 2018-06-06 DIAGNOSIS — Z6826 Body mass index (BMI) 26.0-26.9, adult: Secondary | ICD-10-CM | POA: Diagnosis not present

## 2018-06-06 DIAGNOSIS — S81802S Unspecified open wound, left lower leg, sequela: Secondary | ICD-10-CM | POA: Diagnosis not present

## 2018-06-13 DIAGNOSIS — Z6826 Body mass index (BMI) 26.0-26.9, adult: Secondary | ICD-10-CM | POA: Diagnosis not present

## 2018-06-13 DIAGNOSIS — L03115 Cellulitis of right lower limb: Secondary | ICD-10-CM | POA: Diagnosis not present

## 2018-06-13 DIAGNOSIS — E1122 Type 2 diabetes mellitus with diabetic chronic kidney disease: Secondary | ICD-10-CM | POA: Diagnosis not present

## 2018-06-13 DIAGNOSIS — I129 Hypertensive chronic kidney disease with stage 1 through stage 4 chronic kidney disease, or unspecified chronic kidney disease: Secondary | ICD-10-CM | POA: Diagnosis not present

## 2018-06-13 DIAGNOSIS — L02415 Cutaneous abscess of right lower limb: Secondary | ICD-10-CM | POA: Diagnosis not present

## 2018-06-13 DIAGNOSIS — L02416 Cutaneous abscess of left lower limb: Secondary | ICD-10-CM | POA: Diagnosis not present

## 2018-06-13 DIAGNOSIS — N183 Chronic kidney disease, stage 3 (moderate): Secondary | ICD-10-CM | POA: Diagnosis not present

## 2018-06-13 DIAGNOSIS — L03116 Cellulitis of left lower limb: Secondary | ICD-10-CM | POA: Diagnosis not present

## 2018-06-13 DIAGNOSIS — S81802S Unspecified open wound, left lower leg, sequela: Secondary | ICD-10-CM | POA: Diagnosis not present

## 2018-06-13 DIAGNOSIS — M545 Low back pain: Secondary | ICD-10-CM | POA: Diagnosis not present

## 2018-06-17 DIAGNOSIS — N183 Chronic kidney disease, stage 3 (moderate): Secondary | ICD-10-CM | POA: Diagnosis not present

## 2018-06-17 DIAGNOSIS — L03115 Cellulitis of right lower limb: Secondary | ICD-10-CM | POA: Diagnosis not present

## 2018-06-17 DIAGNOSIS — Z6826 Body mass index (BMI) 26.0-26.9, adult: Secondary | ICD-10-CM | POA: Diagnosis not present

## 2018-06-17 DIAGNOSIS — E1122 Type 2 diabetes mellitus with diabetic chronic kidney disease: Secondary | ICD-10-CM | POA: Diagnosis not present

## 2018-06-17 DIAGNOSIS — S81802S Unspecified open wound, left lower leg, sequela: Secondary | ICD-10-CM | POA: Diagnosis not present

## 2018-06-17 DIAGNOSIS — L02416 Cutaneous abscess of left lower limb: Secondary | ICD-10-CM | POA: Diagnosis not present

## 2018-06-17 DIAGNOSIS — L02415 Cutaneous abscess of right lower limb: Secondary | ICD-10-CM | POA: Diagnosis not present

## 2018-06-17 DIAGNOSIS — L03116 Cellulitis of left lower limb: Secondary | ICD-10-CM | POA: Diagnosis not present

## 2018-06-17 DIAGNOSIS — I129 Hypertensive chronic kidney disease with stage 1 through stage 4 chronic kidney disease, or unspecified chronic kidney disease: Secondary | ICD-10-CM | POA: Diagnosis not present

## 2018-06-17 DIAGNOSIS — M545 Low back pain: Secondary | ICD-10-CM | POA: Diagnosis not present

## 2018-06-18 DIAGNOSIS — L03115 Cellulitis of right lower limb: Secondary | ICD-10-CM | POA: Diagnosis not present

## 2018-06-20 DIAGNOSIS — Z6826 Body mass index (BMI) 26.0-26.9, adult: Secondary | ICD-10-CM | POA: Diagnosis not present

## 2018-06-20 DIAGNOSIS — I129 Hypertensive chronic kidney disease with stage 1 through stage 4 chronic kidney disease, or unspecified chronic kidney disease: Secondary | ICD-10-CM | POA: Diagnosis not present

## 2018-06-20 DIAGNOSIS — E1122 Type 2 diabetes mellitus with diabetic chronic kidney disease: Secondary | ICD-10-CM | POA: Diagnosis not present

## 2018-06-20 DIAGNOSIS — L02415 Cutaneous abscess of right lower limb: Secondary | ICD-10-CM | POA: Diagnosis not present

## 2018-06-20 DIAGNOSIS — S81802S Unspecified open wound, left lower leg, sequela: Secondary | ICD-10-CM | POA: Diagnosis not present

## 2018-06-20 DIAGNOSIS — L03115 Cellulitis of right lower limb: Secondary | ICD-10-CM | POA: Diagnosis not present

## 2018-06-20 DIAGNOSIS — M545 Low back pain: Secondary | ICD-10-CM | POA: Diagnosis not present

## 2018-06-20 DIAGNOSIS — L03116 Cellulitis of left lower limb: Secondary | ICD-10-CM | POA: Diagnosis not present

## 2018-06-20 DIAGNOSIS — L02416 Cutaneous abscess of left lower limb: Secondary | ICD-10-CM | POA: Diagnosis not present

## 2018-06-20 DIAGNOSIS — N183 Chronic kidney disease, stage 3 (moderate): Secondary | ICD-10-CM | POA: Diagnosis not present

## 2018-06-24 DIAGNOSIS — L03116 Cellulitis of left lower limb: Secondary | ICD-10-CM | POA: Diagnosis not present

## 2018-06-24 DIAGNOSIS — E1122 Type 2 diabetes mellitus with diabetic chronic kidney disease: Secondary | ICD-10-CM | POA: Diagnosis not present

## 2018-06-24 DIAGNOSIS — S81802S Unspecified open wound, left lower leg, sequela: Secondary | ICD-10-CM | POA: Diagnosis not present

## 2018-06-24 DIAGNOSIS — N183 Chronic kidney disease, stage 3 (moderate): Secondary | ICD-10-CM | POA: Diagnosis not present

## 2018-06-24 DIAGNOSIS — L02415 Cutaneous abscess of right lower limb: Secondary | ICD-10-CM | POA: Diagnosis not present

## 2018-06-24 DIAGNOSIS — M545 Low back pain: Secondary | ICD-10-CM | POA: Diagnosis not present

## 2018-06-24 DIAGNOSIS — I129 Hypertensive chronic kidney disease with stage 1 through stage 4 chronic kidney disease, or unspecified chronic kidney disease: Secondary | ICD-10-CM | POA: Diagnosis not present

## 2018-06-24 DIAGNOSIS — L02416 Cutaneous abscess of left lower limb: Secondary | ICD-10-CM | POA: Diagnosis not present

## 2018-06-24 DIAGNOSIS — Z6826 Body mass index (BMI) 26.0-26.9, adult: Secondary | ICD-10-CM | POA: Diagnosis not present

## 2018-06-24 DIAGNOSIS — L03115 Cellulitis of right lower limb: Secondary | ICD-10-CM | POA: Diagnosis not present

## 2018-06-27 DIAGNOSIS — L03116 Cellulitis of left lower limb: Secondary | ICD-10-CM | POA: Diagnosis not present

## 2018-06-27 DIAGNOSIS — S81802S Unspecified open wound, left lower leg, sequela: Secondary | ICD-10-CM | POA: Diagnosis not present

## 2018-06-27 DIAGNOSIS — M545 Low back pain: Secondary | ICD-10-CM | POA: Diagnosis not present

## 2018-06-27 DIAGNOSIS — E1122 Type 2 diabetes mellitus with diabetic chronic kidney disease: Secondary | ICD-10-CM | POA: Diagnosis not present

## 2018-06-27 DIAGNOSIS — N183 Chronic kidney disease, stage 3 (moderate): Secondary | ICD-10-CM | POA: Diagnosis not present

## 2018-06-27 DIAGNOSIS — L02416 Cutaneous abscess of left lower limb: Secondary | ICD-10-CM | POA: Diagnosis not present

## 2018-06-27 DIAGNOSIS — L03115 Cellulitis of right lower limb: Secondary | ICD-10-CM | POA: Diagnosis not present

## 2018-06-27 DIAGNOSIS — I129 Hypertensive chronic kidney disease with stage 1 through stage 4 chronic kidney disease, or unspecified chronic kidney disease: Secondary | ICD-10-CM | POA: Diagnosis not present

## 2018-06-27 DIAGNOSIS — Z6826 Body mass index (BMI) 26.0-26.9, adult: Secondary | ICD-10-CM | POA: Diagnosis not present

## 2018-06-27 DIAGNOSIS — L02415 Cutaneous abscess of right lower limb: Secondary | ICD-10-CM | POA: Diagnosis not present

## 2018-07-01 DIAGNOSIS — L03115 Cellulitis of right lower limb: Secondary | ICD-10-CM | POA: Diagnosis not present

## 2018-07-01 DIAGNOSIS — L03116 Cellulitis of left lower limb: Secondary | ICD-10-CM | POA: Diagnosis not present

## 2018-07-01 DIAGNOSIS — L02416 Cutaneous abscess of left lower limb: Secondary | ICD-10-CM | POA: Diagnosis not present

## 2018-07-01 DIAGNOSIS — S81802S Unspecified open wound, left lower leg, sequela: Secondary | ICD-10-CM | POA: Diagnosis not present

## 2018-07-01 DIAGNOSIS — I129 Hypertensive chronic kidney disease with stage 1 through stage 4 chronic kidney disease, or unspecified chronic kidney disease: Secondary | ICD-10-CM | POA: Diagnosis not present

## 2018-07-01 DIAGNOSIS — E1122 Type 2 diabetes mellitus with diabetic chronic kidney disease: Secondary | ICD-10-CM | POA: Diagnosis not present

## 2018-07-01 DIAGNOSIS — L02415 Cutaneous abscess of right lower limb: Secondary | ICD-10-CM | POA: Diagnosis not present

## 2018-07-01 DIAGNOSIS — M545 Low back pain: Secondary | ICD-10-CM | POA: Diagnosis not present

## 2018-07-01 DIAGNOSIS — Z6826 Body mass index (BMI) 26.0-26.9, adult: Secondary | ICD-10-CM | POA: Diagnosis not present

## 2018-07-01 DIAGNOSIS — N183 Chronic kidney disease, stage 3 (moderate): Secondary | ICD-10-CM | POA: Diagnosis not present

## 2018-07-04 DIAGNOSIS — L02416 Cutaneous abscess of left lower limb: Secondary | ICD-10-CM | POA: Diagnosis not present

## 2018-07-04 DIAGNOSIS — S81802S Unspecified open wound, left lower leg, sequela: Secondary | ICD-10-CM | POA: Diagnosis not present

## 2018-07-04 DIAGNOSIS — I129 Hypertensive chronic kidney disease with stage 1 through stage 4 chronic kidney disease, or unspecified chronic kidney disease: Secondary | ICD-10-CM | POA: Diagnosis not present

## 2018-07-04 DIAGNOSIS — L03115 Cellulitis of right lower limb: Secondary | ICD-10-CM | POA: Diagnosis not present

## 2018-07-04 DIAGNOSIS — M545 Low back pain: Secondary | ICD-10-CM | POA: Diagnosis not present

## 2018-07-04 DIAGNOSIS — E1122 Type 2 diabetes mellitus with diabetic chronic kidney disease: Secondary | ICD-10-CM | POA: Diagnosis not present

## 2018-07-04 DIAGNOSIS — Z6826 Body mass index (BMI) 26.0-26.9, adult: Secondary | ICD-10-CM | POA: Diagnosis not present

## 2018-07-04 DIAGNOSIS — L03116 Cellulitis of left lower limb: Secondary | ICD-10-CM | POA: Diagnosis not present

## 2018-07-04 DIAGNOSIS — N183 Chronic kidney disease, stage 3 (moderate): Secondary | ICD-10-CM | POA: Diagnosis not present

## 2018-07-04 DIAGNOSIS — L02415 Cutaneous abscess of right lower limb: Secondary | ICD-10-CM | POA: Diagnosis not present

## 2018-07-05 DIAGNOSIS — I10 Essential (primary) hypertension: Secondary | ICD-10-CM | POA: Diagnosis not present

## 2018-07-05 DIAGNOSIS — Z713 Dietary counseling and surveillance: Secondary | ICD-10-CM | POA: Diagnosis not present

## 2018-07-05 DIAGNOSIS — Z299 Encounter for prophylactic measures, unspecified: Secondary | ICD-10-CM | POA: Diagnosis not present

## 2018-07-05 DIAGNOSIS — Z6826 Body mass index (BMI) 26.0-26.9, adult: Secondary | ICD-10-CM | POA: Diagnosis not present

## 2018-07-05 DIAGNOSIS — R21 Rash and other nonspecific skin eruption: Secondary | ICD-10-CM | POA: Diagnosis not present

## 2018-07-08 DIAGNOSIS — I129 Hypertensive chronic kidney disease with stage 1 through stage 4 chronic kidney disease, or unspecified chronic kidney disease: Secondary | ICD-10-CM | POA: Diagnosis not present

## 2018-07-08 DIAGNOSIS — L02416 Cutaneous abscess of left lower limb: Secondary | ICD-10-CM | POA: Diagnosis not present

## 2018-07-08 DIAGNOSIS — Z6826 Body mass index (BMI) 26.0-26.9, adult: Secondary | ICD-10-CM | POA: Diagnosis not present

## 2018-07-08 DIAGNOSIS — S81802S Unspecified open wound, left lower leg, sequela: Secondary | ICD-10-CM | POA: Diagnosis not present

## 2018-07-08 DIAGNOSIS — L02415 Cutaneous abscess of right lower limb: Secondary | ICD-10-CM | POA: Diagnosis not present

## 2018-07-08 DIAGNOSIS — E1122 Type 2 diabetes mellitus with diabetic chronic kidney disease: Secondary | ICD-10-CM | POA: Diagnosis not present

## 2018-07-08 DIAGNOSIS — N183 Chronic kidney disease, stage 3 (moderate): Secondary | ICD-10-CM | POA: Diagnosis not present

## 2018-07-08 DIAGNOSIS — L03116 Cellulitis of left lower limb: Secondary | ICD-10-CM | POA: Diagnosis not present

## 2018-07-08 DIAGNOSIS — L03115 Cellulitis of right lower limb: Secondary | ICD-10-CM | POA: Diagnosis not present

## 2018-07-08 DIAGNOSIS — M545 Low back pain: Secondary | ICD-10-CM | POA: Diagnosis not present

## 2018-07-15 DIAGNOSIS — I129 Hypertensive chronic kidney disease with stage 1 through stage 4 chronic kidney disease, or unspecified chronic kidney disease: Secondary | ICD-10-CM | POA: Diagnosis not present

## 2018-07-15 DIAGNOSIS — M545 Low back pain: Secondary | ICD-10-CM | POA: Diagnosis not present

## 2018-07-15 DIAGNOSIS — S81802S Unspecified open wound, left lower leg, sequela: Secondary | ICD-10-CM | POA: Diagnosis not present

## 2018-07-15 DIAGNOSIS — E1122 Type 2 diabetes mellitus with diabetic chronic kidney disease: Secondary | ICD-10-CM | POA: Diagnosis not present

## 2018-07-15 DIAGNOSIS — L02416 Cutaneous abscess of left lower limb: Secondary | ICD-10-CM | POA: Diagnosis not present

## 2018-07-15 DIAGNOSIS — N183 Chronic kidney disease, stage 3 (moderate): Secondary | ICD-10-CM | POA: Diagnosis not present

## 2018-07-15 DIAGNOSIS — Z6826 Body mass index (BMI) 26.0-26.9, adult: Secondary | ICD-10-CM | POA: Diagnosis not present

## 2018-07-15 DIAGNOSIS — L02415 Cutaneous abscess of right lower limb: Secondary | ICD-10-CM | POA: Diagnosis not present

## 2018-07-15 DIAGNOSIS — L03116 Cellulitis of left lower limb: Secondary | ICD-10-CM | POA: Diagnosis not present

## 2018-07-15 DIAGNOSIS — L03115 Cellulitis of right lower limb: Secondary | ICD-10-CM | POA: Diagnosis not present

## 2018-07-18 DIAGNOSIS — L02416 Cutaneous abscess of left lower limb: Secondary | ICD-10-CM | POA: Diagnosis not present

## 2018-07-18 DIAGNOSIS — S81802S Unspecified open wound, left lower leg, sequela: Secondary | ICD-10-CM | POA: Diagnosis not present

## 2018-07-18 DIAGNOSIS — L03115 Cellulitis of right lower limb: Secondary | ICD-10-CM | POA: Diagnosis not present

## 2018-07-18 DIAGNOSIS — I129 Hypertensive chronic kidney disease with stage 1 through stage 4 chronic kidney disease, or unspecified chronic kidney disease: Secondary | ICD-10-CM | POA: Diagnosis not present

## 2018-07-18 DIAGNOSIS — Z6826 Body mass index (BMI) 26.0-26.9, adult: Secondary | ICD-10-CM | POA: Diagnosis not present

## 2018-07-18 DIAGNOSIS — M545 Low back pain: Secondary | ICD-10-CM | POA: Diagnosis not present

## 2018-07-18 DIAGNOSIS — L03116 Cellulitis of left lower limb: Secondary | ICD-10-CM | POA: Diagnosis not present

## 2018-07-18 DIAGNOSIS — E1122 Type 2 diabetes mellitus with diabetic chronic kidney disease: Secondary | ICD-10-CM | POA: Diagnosis not present

## 2018-07-18 DIAGNOSIS — N183 Chronic kidney disease, stage 3 (moderate): Secondary | ICD-10-CM | POA: Diagnosis not present

## 2018-07-18 DIAGNOSIS — L02415 Cutaneous abscess of right lower limb: Secondary | ICD-10-CM | POA: Diagnosis not present

## 2018-07-23 ENCOUNTER — Telehealth: Payer: Self-pay | Admitting: Hematology and Oncology

## 2018-07-23 DIAGNOSIS — E7800 Pure hypercholesterolemia, unspecified: Secondary | ICD-10-CM | POA: Diagnosis not present

## 2018-07-23 DIAGNOSIS — I872 Venous insufficiency (chronic) (peripheral): Secondary | ICD-10-CM | POA: Diagnosis not present

## 2018-07-23 DIAGNOSIS — L97811 Non-pressure chronic ulcer of other part of right lower leg limited to breakdown of skin: Secondary | ICD-10-CM | POA: Diagnosis not present

## 2018-07-23 DIAGNOSIS — L97821 Non-pressure chronic ulcer of other part of left lower leg limited to breakdown of skin: Secondary | ICD-10-CM | POA: Diagnosis not present

## 2018-07-23 DIAGNOSIS — N183 Chronic kidney disease, stage 3 unspecified: Secondary | ICD-10-CM | POA: Diagnosis not present

## 2018-07-23 DIAGNOSIS — L97221 Non-pressure chronic ulcer of left calf limited to breakdown of skin: Secondary | ICD-10-CM | POA: Diagnosis not present

## 2018-07-23 DIAGNOSIS — M545 Low back pain: Secondary | ICD-10-CM | POA: Diagnosis not present

## 2018-07-23 DIAGNOSIS — L03115 Cellulitis of right lower limb: Secondary | ICD-10-CM | POA: Diagnosis not present

## 2018-07-23 DIAGNOSIS — E559 Vitamin D deficiency, unspecified: Secondary | ICD-10-CM | POA: Diagnosis not present

## 2018-07-23 DIAGNOSIS — I129 Hypertensive chronic kidney disease with stage 1 through stage 4 chronic kidney disease, or unspecified chronic kidney disease: Secondary | ICD-10-CM | POA: Diagnosis not present

## 2018-07-23 DIAGNOSIS — L03116 Cellulitis of left lower limb: Secondary | ICD-10-CM | POA: Diagnosis not present

## 2018-07-23 NOTE — Telephone Encounter (Signed)
Returned patient's phone call regarding cancelling an appointment, left a voicemail.

## 2018-07-24 NOTE — Assessment & Plan Note (Deleted)
Left lumpectomy 10/07/2015: Invasive lobular cancer grade 1, 0.5 cm, LCIS with calcifications, focally present at superior margin, ER 95% PR 0%, her-2 neg, Ki-67 2% T1 aN0 stage IA pathologic staging Left breast Re-excision 11/22/2015: No residual cancer identified Did not need adjuvant radiation therapy given her advanced age  Current treatment: Anastrozole 1 mg by mouth daily started 10/28/2015  Anastrozole toxicities:No side effects anastrozole.  Breast cancer surveillance: 1.  Breast exam 07/24/2017: Benign  2. mammogram  08/20/2017: Benign, breast density category B  Return to clinic in1 yearfor follow-up

## 2018-07-25 ENCOUNTER — Telehealth: Payer: Self-pay | Admitting: Hematology and Oncology

## 2018-07-25 NOTE — Telephone Encounter (Signed)
Spoke with pt's husband and she would like to cancel appt for 07/30/18 until it is safe to come out.

## 2018-07-26 ENCOUNTER — Telehealth: Payer: Self-pay | Admitting: Hematology and Oncology

## 2018-07-26 DIAGNOSIS — N183 Chronic kidney disease, stage 3 (moderate): Secondary | ICD-10-CM | POA: Diagnosis not present

## 2018-07-26 DIAGNOSIS — I129 Hypertensive chronic kidney disease with stage 1 through stage 4 chronic kidney disease, or unspecified chronic kidney disease: Secondary | ICD-10-CM | POA: Diagnosis not present

## 2018-07-26 DIAGNOSIS — L03115 Cellulitis of right lower limb: Secondary | ICD-10-CM | POA: Diagnosis not present

## 2018-07-26 DIAGNOSIS — L97221 Non-pressure chronic ulcer of left calf limited to breakdown of skin: Secondary | ICD-10-CM | POA: Diagnosis not present

## 2018-07-26 DIAGNOSIS — L97821 Non-pressure chronic ulcer of other part of left lower leg limited to breakdown of skin: Secondary | ICD-10-CM | POA: Diagnosis not present

## 2018-07-26 DIAGNOSIS — L03116 Cellulitis of left lower limb: Secondary | ICD-10-CM | POA: Diagnosis not present

## 2018-07-26 DIAGNOSIS — E559 Vitamin D deficiency, unspecified: Secondary | ICD-10-CM | POA: Diagnosis not present

## 2018-07-26 DIAGNOSIS — M545 Low back pain: Secondary | ICD-10-CM | POA: Diagnosis not present

## 2018-07-26 DIAGNOSIS — E7800 Pure hypercholesterolemia, unspecified: Secondary | ICD-10-CM | POA: Diagnosis not present

## 2018-07-26 DIAGNOSIS — L97811 Non-pressure chronic ulcer of other part of right lower leg limited to breakdown of skin: Secondary | ICD-10-CM | POA: Diagnosis not present

## 2018-07-26 NOTE — Telephone Encounter (Signed)
Called regarding upcoming webex appointment, per patient's request appointment has been cancelled due to personal concerns. Patient will call back to reschedule when she is ready for a walk in visit.   Message to provider.

## 2018-07-30 ENCOUNTER — Ambulatory Visit: Payer: Medicare HMO | Admitting: Hematology and Oncology

## 2018-07-30 DIAGNOSIS — L97221 Non-pressure chronic ulcer of left calf limited to breakdown of skin: Secondary | ICD-10-CM | POA: Diagnosis not present

## 2018-07-30 DIAGNOSIS — I129 Hypertensive chronic kidney disease with stage 1 through stage 4 chronic kidney disease, or unspecified chronic kidney disease: Secondary | ICD-10-CM | POA: Diagnosis not present

## 2018-07-30 DIAGNOSIS — M545 Low back pain: Secondary | ICD-10-CM | POA: Diagnosis not present

## 2018-07-30 DIAGNOSIS — L97821 Non-pressure chronic ulcer of other part of left lower leg limited to breakdown of skin: Secondary | ICD-10-CM | POA: Diagnosis not present

## 2018-07-30 DIAGNOSIS — N183 Chronic kidney disease, stage 3 (moderate): Secondary | ICD-10-CM | POA: Diagnosis not present

## 2018-07-30 DIAGNOSIS — L97811 Non-pressure chronic ulcer of other part of right lower leg limited to breakdown of skin: Secondary | ICD-10-CM | POA: Diagnosis not present

## 2018-07-30 DIAGNOSIS — L03115 Cellulitis of right lower limb: Secondary | ICD-10-CM | POA: Diagnosis not present

## 2018-07-30 DIAGNOSIS — E559 Vitamin D deficiency, unspecified: Secondary | ICD-10-CM | POA: Diagnosis not present

## 2018-07-30 DIAGNOSIS — L03116 Cellulitis of left lower limb: Secondary | ICD-10-CM | POA: Diagnosis not present

## 2018-07-30 DIAGNOSIS — E7800 Pure hypercholesterolemia, unspecified: Secondary | ICD-10-CM | POA: Diagnosis not present

## 2018-08-02 DIAGNOSIS — L97821 Non-pressure chronic ulcer of other part of left lower leg limited to breakdown of skin: Secondary | ICD-10-CM | POA: Diagnosis not present

## 2018-08-02 DIAGNOSIS — I129 Hypertensive chronic kidney disease with stage 1 through stage 4 chronic kidney disease, or unspecified chronic kidney disease: Secondary | ICD-10-CM | POA: Diagnosis not present

## 2018-08-02 DIAGNOSIS — L03115 Cellulitis of right lower limb: Secondary | ICD-10-CM | POA: Diagnosis not present

## 2018-08-02 DIAGNOSIS — M545 Low back pain: Secondary | ICD-10-CM | POA: Diagnosis not present

## 2018-08-02 DIAGNOSIS — L03116 Cellulitis of left lower limb: Secondary | ICD-10-CM | POA: Diagnosis not present

## 2018-08-02 DIAGNOSIS — E559 Vitamin D deficiency, unspecified: Secondary | ICD-10-CM | POA: Diagnosis not present

## 2018-08-02 DIAGNOSIS — L97221 Non-pressure chronic ulcer of left calf limited to breakdown of skin: Secondary | ICD-10-CM | POA: Diagnosis not present

## 2018-08-02 DIAGNOSIS — E7800 Pure hypercholesterolemia, unspecified: Secondary | ICD-10-CM | POA: Diagnosis not present

## 2018-08-02 DIAGNOSIS — N183 Chronic kidney disease, stage 3 (moderate): Secondary | ICD-10-CM | POA: Diagnosis not present

## 2018-08-02 DIAGNOSIS — L97811 Non-pressure chronic ulcer of other part of right lower leg limited to breakdown of skin: Secondary | ICD-10-CM | POA: Diagnosis not present

## 2018-08-06 DIAGNOSIS — L97821 Non-pressure chronic ulcer of other part of left lower leg limited to breakdown of skin: Secondary | ICD-10-CM | POA: Diagnosis not present

## 2018-08-06 DIAGNOSIS — M545 Low back pain: Secondary | ICD-10-CM | POA: Diagnosis not present

## 2018-08-06 DIAGNOSIS — E7800 Pure hypercholesterolemia, unspecified: Secondary | ICD-10-CM | POA: Diagnosis not present

## 2018-08-06 DIAGNOSIS — E559 Vitamin D deficiency, unspecified: Secondary | ICD-10-CM | POA: Diagnosis not present

## 2018-08-06 DIAGNOSIS — L97221 Non-pressure chronic ulcer of left calf limited to breakdown of skin: Secondary | ICD-10-CM | POA: Diagnosis not present

## 2018-08-06 DIAGNOSIS — N183 Chronic kidney disease, stage 3 (moderate): Secondary | ICD-10-CM | POA: Diagnosis not present

## 2018-08-06 DIAGNOSIS — L97811 Non-pressure chronic ulcer of other part of right lower leg limited to breakdown of skin: Secondary | ICD-10-CM | POA: Diagnosis not present

## 2018-08-06 DIAGNOSIS — L03116 Cellulitis of left lower limb: Secondary | ICD-10-CM | POA: Diagnosis not present

## 2018-08-06 DIAGNOSIS — I129 Hypertensive chronic kidney disease with stage 1 through stage 4 chronic kidney disease, or unspecified chronic kidney disease: Secondary | ICD-10-CM | POA: Diagnosis not present

## 2018-08-06 DIAGNOSIS — L03115 Cellulitis of right lower limb: Secondary | ICD-10-CM | POA: Diagnosis not present

## 2018-08-09 DIAGNOSIS — E7800 Pure hypercholesterolemia, unspecified: Secondary | ICD-10-CM | POA: Diagnosis not present

## 2018-08-09 DIAGNOSIS — E559 Vitamin D deficiency, unspecified: Secondary | ICD-10-CM | POA: Diagnosis not present

## 2018-08-09 DIAGNOSIS — I129 Hypertensive chronic kidney disease with stage 1 through stage 4 chronic kidney disease, or unspecified chronic kidney disease: Secondary | ICD-10-CM | POA: Diagnosis not present

## 2018-08-09 DIAGNOSIS — L03116 Cellulitis of left lower limb: Secondary | ICD-10-CM | POA: Diagnosis not present

## 2018-08-09 DIAGNOSIS — L97811 Non-pressure chronic ulcer of other part of right lower leg limited to breakdown of skin: Secondary | ICD-10-CM | POA: Diagnosis not present

## 2018-08-09 DIAGNOSIS — N183 Chronic kidney disease, stage 3 (moderate): Secondary | ICD-10-CM | POA: Diagnosis not present

## 2018-08-09 DIAGNOSIS — M545 Low back pain: Secondary | ICD-10-CM | POA: Diagnosis not present

## 2018-08-09 DIAGNOSIS — L97221 Non-pressure chronic ulcer of left calf limited to breakdown of skin: Secondary | ICD-10-CM | POA: Diagnosis not present

## 2018-08-09 DIAGNOSIS — L03115 Cellulitis of right lower limb: Secondary | ICD-10-CM | POA: Diagnosis not present

## 2018-08-09 DIAGNOSIS — L97821 Non-pressure chronic ulcer of other part of left lower leg limited to breakdown of skin: Secondary | ICD-10-CM | POA: Diagnosis not present

## 2018-08-13 DIAGNOSIS — E559 Vitamin D deficiency, unspecified: Secondary | ICD-10-CM | POA: Diagnosis not present

## 2018-08-13 DIAGNOSIS — L97221 Non-pressure chronic ulcer of left calf limited to breakdown of skin: Secondary | ICD-10-CM | POA: Diagnosis not present

## 2018-08-13 DIAGNOSIS — L03115 Cellulitis of right lower limb: Secondary | ICD-10-CM | POA: Diagnosis not present

## 2018-08-13 DIAGNOSIS — E7800 Pure hypercholesterolemia, unspecified: Secondary | ICD-10-CM | POA: Diagnosis not present

## 2018-08-13 DIAGNOSIS — L97811 Non-pressure chronic ulcer of other part of right lower leg limited to breakdown of skin: Secondary | ICD-10-CM | POA: Diagnosis not present

## 2018-08-13 DIAGNOSIS — M545 Low back pain: Secondary | ICD-10-CM | POA: Diagnosis not present

## 2018-08-13 DIAGNOSIS — L03116 Cellulitis of left lower limb: Secondary | ICD-10-CM | POA: Diagnosis not present

## 2018-08-13 DIAGNOSIS — N183 Chronic kidney disease, stage 3 (moderate): Secondary | ICD-10-CM | POA: Diagnosis not present

## 2018-08-13 DIAGNOSIS — L97821 Non-pressure chronic ulcer of other part of left lower leg limited to breakdown of skin: Secondary | ICD-10-CM | POA: Diagnosis not present

## 2018-08-13 DIAGNOSIS — I129 Hypertensive chronic kidney disease with stage 1 through stage 4 chronic kidney disease, or unspecified chronic kidney disease: Secondary | ICD-10-CM | POA: Diagnosis not present

## 2018-08-16 DIAGNOSIS — N183 Chronic kidney disease, stage 3 (moderate): Secondary | ICD-10-CM | POA: Diagnosis not present

## 2018-08-16 DIAGNOSIS — M545 Low back pain: Secondary | ICD-10-CM | POA: Diagnosis not present

## 2018-08-16 DIAGNOSIS — L97221 Non-pressure chronic ulcer of left calf limited to breakdown of skin: Secondary | ICD-10-CM | POA: Diagnosis not present

## 2018-08-16 DIAGNOSIS — I129 Hypertensive chronic kidney disease with stage 1 through stage 4 chronic kidney disease, or unspecified chronic kidney disease: Secondary | ICD-10-CM | POA: Diagnosis not present

## 2018-08-16 DIAGNOSIS — L03115 Cellulitis of right lower limb: Secondary | ICD-10-CM | POA: Diagnosis not present

## 2018-08-16 DIAGNOSIS — E7800 Pure hypercholesterolemia, unspecified: Secondary | ICD-10-CM | POA: Diagnosis not present

## 2018-08-16 DIAGNOSIS — L03116 Cellulitis of left lower limb: Secondary | ICD-10-CM | POA: Diagnosis not present

## 2018-08-16 DIAGNOSIS — E559 Vitamin D deficiency, unspecified: Secondary | ICD-10-CM | POA: Diagnosis not present

## 2018-08-16 DIAGNOSIS — L97821 Non-pressure chronic ulcer of other part of left lower leg limited to breakdown of skin: Secondary | ICD-10-CM | POA: Diagnosis not present

## 2018-08-16 DIAGNOSIS — L97811 Non-pressure chronic ulcer of other part of right lower leg limited to breakdown of skin: Secondary | ICD-10-CM | POA: Diagnosis not present

## 2018-08-20 DIAGNOSIS — L03115 Cellulitis of right lower limb: Secondary | ICD-10-CM | POA: Diagnosis not present

## 2018-08-20 DIAGNOSIS — E559 Vitamin D deficiency, unspecified: Secondary | ICD-10-CM | POA: Diagnosis not present

## 2018-08-20 DIAGNOSIS — L03116 Cellulitis of left lower limb: Secondary | ICD-10-CM | POA: Diagnosis not present

## 2018-08-20 DIAGNOSIS — M545 Low back pain: Secondary | ICD-10-CM | POA: Diagnosis not present

## 2018-08-20 DIAGNOSIS — L97821 Non-pressure chronic ulcer of other part of left lower leg limited to breakdown of skin: Secondary | ICD-10-CM | POA: Diagnosis not present

## 2018-08-20 DIAGNOSIS — E7800 Pure hypercholesterolemia, unspecified: Secondary | ICD-10-CM | POA: Diagnosis not present

## 2018-08-20 DIAGNOSIS — I129 Hypertensive chronic kidney disease with stage 1 through stage 4 chronic kidney disease, or unspecified chronic kidney disease: Secondary | ICD-10-CM | POA: Diagnosis not present

## 2018-08-20 DIAGNOSIS — N183 Chronic kidney disease, stage 3 (moderate): Secondary | ICD-10-CM | POA: Diagnosis not present

## 2018-08-20 DIAGNOSIS — L97811 Non-pressure chronic ulcer of other part of right lower leg limited to breakdown of skin: Secondary | ICD-10-CM | POA: Diagnosis not present

## 2018-08-20 DIAGNOSIS — L97221 Non-pressure chronic ulcer of left calf limited to breakdown of skin: Secondary | ICD-10-CM | POA: Diagnosis not present

## 2018-08-22 DIAGNOSIS — E7800 Pure hypercholesterolemia, unspecified: Secondary | ICD-10-CM | POA: Diagnosis not present

## 2018-08-22 DIAGNOSIS — M545 Low back pain: Secondary | ICD-10-CM | POA: Diagnosis not present

## 2018-08-22 DIAGNOSIS — E559 Vitamin D deficiency, unspecified: Secondary | ICD-10-CM | POA: Diagnosis not present

## 2018-08-22 DIAGNOSIS — N183 Chronic kidney disease, stage 3 (moderate): Secondary | ICD-10-CM | POA: Diagnosis not present

## 2018-08-22 DIAGNOSIS — L03115 Cellulitis of right lower limb: Secondary | ICD-10-CM | POA: Diagnosis not present

## 2018-08-22 DIAGNOSIS — I129 Hypertensive chronic kidney disease with stage 1 through stage 4 chronic kidney disease, or unspecified chronic kidney disease: Secondary | ICD-10-CM | POA: Diagnosis not present

## 2018-08-22 DIAGNOSIS — L97811 Non-pressure chronic ulcer of other part of right lower leg limited to breakdown of skin: Secondary | ICD-10-CM | POA: Diagnosis not present

## 2018-08-22 DIAGNOSIS — L97221 Non-pressure chronic ulcer of left calf limited to breakdown of skin: Secondary | ICD-10-CM | POA: Diagnosis not present

## 2018-08-22 DIAGNOSIS — L03116 Cellulitis of left lower limb: Secondary | ICD-10-CM | POA: Diagnosis not present

## 2018-08-22 DIAGNOSIS — L97821 Non-pressure chronic ulcer of other part of left lower leg limited to breakdown of skin: Secondary | ICD-10-CM | POA: Diagnosis not present

## 2018-08-23 DIAGNOSIS — L03115 Cellulitis of right lower limb: Secondary | ICD-10-CM | POA: Diagnosis not present

## 2018-08-23 DIAGNOSIS — E7800 Pure hypercholesterolemia, unspecified: Secondary | ICD-10-CM | POA: Diagnosis not present

## 2018-08-23 DIAGNOSIS — E559 Vitamin D deficiency, unspecified: Secondary | ICD-10-CM | POA: Diagnosis not present

## 2018-08-23 DIAGNOSIS — M545 Low back pain: Secondary | ICD-10-CM | POA: Diagnosis not present

## 2018-08-23 DIAGNOSIS — I129 Hypertensive chronic kidney disease with stage 1 through stage 4 chronic kidney disease, or unspecified chronic kidney disease: Secondary | ICD-10-CM | POA: Diagnosis not present

## 2018-08-23 DIAGNOSIS — L97221 Non-pressure chronic ulcer of left calf limited to breakdown of skin: Secondary | ICD-10-CM | POA: Diagnosis not present

## 2018-08-23 DIAGNOSIS — L97821 Non-pressure chronic ulcer of other part of left lower leg limited to breakdown of skin: Secondary | ICD-10-CM | POA: Diagnosis not present

## 2018-08-23 DIAGNOSIS — L97811 Non-pressure chronic ulcer of other part of right lower leg limited to breakdown of skin: Secondary | ICD-10-CM | POA: Diagnosis not present

## 2018-08-23 DIAGNOSIS — N183 Chronic kidney disease, stage 3 (moderate): Secondary | ICD-10-CM | POA: Diagnosis not present

## 2018-08-23 DIAGNOSIS — L03116 Cellulitis of left lower limb: Secondary | ICD-10-CM | POA: Diagnosis not present

## 2018-08-27 DIAGNOSIS — M545 Low back pain: Secondary | ICD-10-CM | POA: Diagnosis not present

## 2018-08-27 DIAGNOSIS — L03115 Cellulitis of right lower limb: Secondary | ICD-10-CM | POA: Diagnosis not present

## 2018-08-27 DIAGNOSIS — L97821 Non-pressure chronic ulcer of other part of left lower leg limited to breakdown of skin: Secondary | ICD-10-CM | POA: Diagnosis not present

## 2018-08-27 DIAGNOSIS — L03116 Cellulitis of left lower limb: Secondary | ICD-10-CM | POA: Diagnosis not present

## 2018-08-27 DIAGNOSIS — I129 Hypertensive chronic kidney disease with stage 1 through stage 4 chronic kidney disease, or unspecified chronic kidney disease: Secondary | ICD-10-CM | POA: Diagnosis not present

## 2018-08-27 DIAGNOSIS — L97221 Non-pressure chronic ulcer of left calf limited to breakdown of skin: Secondary | ICD-10-CM | POA: Diagnosis not present

## 2018-08-27 DIAGNOSIS — E7800 Pure hypercholesterolemia, unspecified: Secondary | ICD-10-CM | POA: Diagnosis not present

## 2018-08-27 DIAGNOSIS — N183 Chronic kidney disease, stage 3 (moderate): Secondary | ICD-10-CM | POA: Diagnosis not present

## 2018-08-27 DIAGNOSIS — E559 Vitamin D deficiency, unspecified: Secondary | ICD-10-CM | POA: Diagnosis not present

## 2018-08-27 DIAGNOSIS — L97811 Non-pressure chronic ulcer of other part of right lower leg limited to breakdown of skin: Secondary | ICD-10-CM | POA: Diagnosis not present

## 2018-08-30 DIAGNOSIS — L97811 Non-pressure chronic ulcer of other part of right lower leg limited to breakdown of skin: Secondary | ICD-10-CM | POA: Diagnosis not present

## 2018-08-30 DIAGNOSIS — L03115 Cellulitis of right lower limb: Secondary | ICD-10-CM | POA: Diagnosis not present

## 2018-08-30 DIAGNOSIS — M545 Low back pain: Secondary | ICD-10-CM | POA: Diagnosis not present

## 2018-08-30 DIAGNOSIS — E7800 Pure hypercholesterolemia, unspecified: Secondary | ICD-10-CM | POA: Diagnosis not present

## 2018-08-30 DIAGNOSIS — L03116 Cellulitis of left lower limb: Secondary | ICD-10-CM | POA: Diagnosis not present

## 2018-08-30 DIAGNOSIS — I129 Hypertensive chronic kidney disease with stage 1 through stage 4 chronic kidney disease, or unspecified chronic kidney disease: Secondary | ICD-10-CM | POA: Diagnosis not present

## 2018-08-30 DIAGNOSIS — L97821 Non-pressure chronic ulcer of other part of left lower leg limited to breakdown of skin: Secondary | ICD-10-CM | POA: Diagnosis not present

## 2018-08-30 DIAGNOSIS — E559 Vitamin D deficiency, unspecified: Secondary | ICD-10-CM | POA: Diagnosis not present

## 2018-08-30 DIAGNOSIS — N183 Chronic kidney disease, stage 3 (moderate): Secondary | ICD-10-CM | POA: Diagnosis not present

## 2018-08-30 DIAGNOSIS — L97221 Non-pressure chronic ulcer of left calf limited to breakdown of skin: Secondary | ICD-10-CM | POA: Diagnosis not present

## 2018-09-03 DIAGNOSIS — M545 Low back pain: Secondary | ICD-10-CM | POA: Diagnosis not present

## 2018-09-03 DIAGNOSIS — I129 Hypertensive chronic kidney disease with stage 1 through stage 4 chronic kidney disease, or unspecified chronic kidney disease: Secondary | ICD-10-CM | POA: Diagnosis not present

## 2018-09-03 DIAGNOSIS — L97221 Non-pressure chronic ulcer of left calf limited to breakdown of skin: Secondary | ICD-10-CM | POA: Diagnosis not present

## 2018-09-03 DIAGNOSIS — L97821 Non-pressure chronic ulcer of other part of left lower leg limited to breakdown of skin: Secondary | ICD-10-CM | POA: Diagnosis not present

## 2018-09-03 DIAGNOSIS — L97811 Non-pressure chronic ulcer of other part of right lower leg limited to breakdown of skin: Secondary | ICD-10-CM | POA: Diagnosis not present

## 2018-09-03 DIAGNOSIS — E7800 Pure hypercholesterolemia, unspecified: Secondary | ICD-10-CM | POA: Diagnosis not present

## 2018-09-03 DIAGNOSIS — E559 Vitamin D deficiency, unspecified: Secondary | ICD-10-CM | POA: Diagnosis not present

## 2018-09-03 DIAGNOSIS — L03116 Cellulitis of left lower limb: Secondary | ICD-10-CM | POA: Diagnosis not present

## 2018-09-03 DIAGNOSIS — L03115 Cellulitis of right lower limb: Secondary | ICD-10-CM | POA: Diagnosis not present

## 2018-09-03 DIAGNOSIS — N183 Chronic kidney disease, stage 3 (moderate): Secondary | ICD-10-CM | POA: Diagnosis not present

## 2018-09-06 DIAGNOSIS — N183 Chronic kidney disease, stage 3 (moderate): Secondary | ICD-10-CM | POA: Diagnosis not present

## 2018-09-06 DIAGNOSIS — L97221 Non-pressure chronic ulcer of left calf limited to breakdown of skin: Secondary | ICD-10-CM | POA: Diagnosis not present

## 2018-09-06 DIAGNOSIS — L03116 Cellulitis of left lower limb: Secondary | ICD-10-CM | POA: Diagnosis not present

## 2018-09-06 DIAGNOSIS — L97811 Non-pressure chronic ulcer of other part of right lower leg limited to breakdown of skin: Secondary | ICD-10-CM | POA: Diagnosis not present

## 2018-09-06 DIAGNOSIS — E559 Vitamin D deficiency, unspecified: Secondary | ICD-10-CM | POA: Diagnosis not present

## 2018-09-06 DIAGNOSIS — I129 Hypertensive chronic kidney disease with stage 1 through stage 4 chronic kidney disease, or unspecified chronic kidney disease: Secondary | ICD-10-CM | POA: Diagnosis not present

## 2018-09-06 DIAGNOSIS — L97821 Non-pressure chronic ulcer of other part of left lower leg limited to breakdown of skin: Secondary | ICD-10-CM | POA: Diagnosis not present

## 2018-09-06 DIAGNOSIS — L03115 Cellulitis of right lower limb: Secondary | ICD-10-CM | POA: Diagnosis not present

## 2018-09-06 DIAGNOSIS — M545 Low back pain: Secondary | ICD-10-CM | POA: Diagnosis not present

## 2018-09-06 DIAGNOSIS — E7800 Pure hypercholesterolemia, unspecified: Secondary | ICD-10-CM | POA: Diagnosis not present

## 2018-09-10 DIAGNOSIS — L97811 Non-pressure chronic ulcer of other part of right lower leg limited to breakdown of skin: Secondary | ICD-10-CM | POA: Diagnosis not present

## 2018-09-10 DIAGNOSIS — N183 Chronic kidney disease, stage 3 (moderate): Secondary | ICD-10-CM | POA: Diagnosis not present

## 2018-09-10 DIAGNOSIS — E7800 Pure hypercholesterolemia, unspecified: Secondary | ICD-10-CM | POA: Diagnosis not present

## 2018-09-10 DIAGNOSIS — M545 Low back pain: Secondary | ICD-10-CM | POA: Diagnosis not present

## 2018-09-10 DIAGNOSIS — L97821 Non-pressure chronic ulcer of other part of left lower leg limited to breakdown of skin: Secondary | ICD-10-CM | POA: Diagnosis not present

## 2018-09-10 DIAGNOSIS — L03116 Cellulitis of left lower limb: Secondary | ICD-10-CM | POA: Diagnosis not present

## 2018-09-10 DIAGNOSIS — E559 Vitamin D deficiency, unspecified: Secondary | ICD-10-CM | POA: Diagnosis not present

## 2018-09-10 DIAGNOSIS — L97221 Non-pressure chronic ulcer of left calf limited to breakdown of skin: Secondary | ICD-10-CM | POA: Diagnosis not present

## 2018-09-10 DIAGNOSIS — L03115 Cellulitis of right lower limb: Secondary | ICD-10-CM | POA: Diagnosis not present

## 2018-09-10 DIAGNOSIS — I129 Hypertensive chronic kidney disease with stage 1 through stage 4 chronic kidney disease, or unspecified chronic kidney disease: Secondary | ICD-10-CM | POA: Diagnosis not present

## 2018-09-17 DIAGNOSIS — L03115 Cellulitis of right lower limb: Secondary | ICD-10-CM | POA: Diagnosis not present

## 2018-09-17 DIAGNOSIS — E559 Vitamin D deficiency, unspecified: Secondary | ICD-10-CM | POA: Diagnosis not present

## 2018-09-17 DIAGNOSIS — L97221 Non-pressure chronic ulcer of left calf limited to breakdown of skin: Secondary | ICD-10-CM | POA: Diagnosis not present

## 2018-09-17 DIAGNOSIS — N183 Chronic kidney disease, stage 3 (moderate): Secondary | ICD-10-CM | POA: Diagnosis not present

## 2018-09-17 DIAGNOSIS — L03116 Cellulitis of left lower limb: Secondary | ICD-10-CM | POA: Diagnosis not present

## 2018-09-17 DIAGNOSIS — L97821 Non-pressure chronic ulcer of other part of left lower leg limited to breakdown of skin: Secondary | ICD-10-CM | POA: Diagnosis not present

## 2018-09-17 DIAGNOSIS — I129 Hypertensive chronic kidney disease with stage 1 through stage 4 chronic kidney disease, or unspecified chronic kidney disease: Secondary | ICD-10-CM | POA: Diagnosis not present

## 2018-09-17 DIAGNOSIS — E7800 Pure hypercholesterolemia, unspecified: Secondary | ICD-10-CM | POA: Diagnosis not present

## 2018-09-17 DIAGNOSIS — M545 Low back pain: Secondary | ICD-10-CM | POA: Diagnosis not present

## 2018-09-17 DIAGNOSIS — L97811 Non-pressure chronic ulcer of other part of right lower leg limited to breakdown of skin: Secondary | ICD-10-CM | POA: Diagnosis not present

## 2018-09-19 DIAGNOSIS — L97821 Non-pressure chronic ulcer of other part of left lower leg limited to breakdown of skin: Secondary | ICD-10-CM | POA: Diagnosis not present

## 2018-09-19 DIAGNOSIS — L03115 Cellulitis of right lower limb: Secondary | ICD-10-CM | POA: Diagnosis not present

## 2018-09-19 DIAGNOSIS — L03116 Cellulitis of left lower limb: Secondary | ICD-10-CM | POA: Diagnosis not present

## 2018-09-19 DIAGNOSIS — L97811 Non-pressure chronic ulcer of other part of right lower leg limited to breakdown of skin: Secondary | ICD-10-CM | POA: Diagnosis not present

## 2018-09-20 DIAGNOSIS — L03116 Cellulitis of left lower limb: Secondary | ICD-10-CM | POA: Diagnosis not present

## 2018-09-20 DIAGNOSIS — L97811 Non-pressure chronic ulcer of other part of right lower leg limited to breakdown of skin: Secondary | ICD-10-CM | POA: Diagnosis not present

## 2018-09-20 DIAGNOSIS — I129 Hypertensive chronic kidney disease with stage 1 through stage 4 chronic kidney disease, or unspecified chronic kidney disease: Secondary | ICD-10-CM | POA: Diagnosis not present

## 2018-09-20 DIAGNOSIS — E559 Vitamin D deficiency, unspecified: Secondary | ICD-10-CM | POA: Diagnosis not present

## 2018-09-20 DIAGNOSIS — E7800 Pure hypercholesterolemia, unspecified: Secondary | ICD-10-CM | POA: Diagnosis not present

## 2018-09-20 DIAGNOSIS — L97221 Non-pressure chronic ulcer of left calf limited to breakdown of skin: Secondary | ICD-10-CM | POA: Diagnosis not present

## 2018-09-20 DIAGNOSIS — N183 Chronic kidney disease, stage 3 (moderate): Secondary | ICD-10-CM | POA: Diagnosis not present

## 2018-09-20 DIAGNOSIS — L97821 Non-pressure chronic ulcer of other part of left lower leg limited to breakdown of skin: Secondary | ICD-10-CM | POA: Diagnosis not present

## 2018-09-20 DIAGNOSIS — M545 Low back pain: Secondary | ICD-10-CM | POA: Diagnosis not present

## 2018-09-20 DIAGNOSIS — L03115 Cellulitis of right lower limb: Secondary | ICD-10-CM | POA: Diagnosis not present

## 2018-09-21 DIAGNOSIS — L97811 Non-pressure chronic ulcer of other part of right lower leg limited to breakdown of skin: Secondary | ICD-10-CM | POA: Diagnosis not present

## 2018-09-21 DIAGNOSIS — M545 Low back pain: Secondary | ICD-10-CM | POA: Diagnosis not present

## 2018-09-21 DIAGNOSIS — I129 Hypertensive chronic kidney disease with stage 1 through stage 4 chronic kidney disease, or unspecified chronic kidney disease: Secondary | ICD-10-CM | POA: Diagnosis not present

## 2018-09-21 DIAGNOSIS — E7800 Pure hypercholesterolemia, unspecified: Secondary | ICD-10-CM | POA: Diagnosis not present

## 2018-09-21 DIAGNOSIS — L97221 Non-pressure chronic ulcer of left calf limited to breakdown of skin: Secondary | ICD-10-CM | POA: Diagnosis not present

## 2018-09-21 DIAGNOSIS — E559 Vitamin D deficiency, unspecified: Secondary | ICD-10-CM | POA: Diagnosis not present

## 2018-09-21 DIAGNOSIS — N183 Chronic kidney disease, stage 3 (moderate): Secondary | ICD-10-CM | POA: Diagnosis not present

## 2018-09-21 DIAGNOSIS — I872 Venous insufficiency (chronic) (peripheral): Secondary | ICD-10-CM | POA: Diagnosis not present

## 2018-10-21 DIAGNOSIS — L97221 Non-pressure chronic ulcer of left calf limited to breakdown of skin: Secondary | ICD-10-CM | POA: Diagnosis not present

## 2018-10-21 DIAGNOSIS — L97811 Non-pressure chronic ulcer of other part of right lower leg limited to breakdown of skin: Secondary | ICD-10-CM | POA: Diagnosis not present

## 2018-10-21 DIAGNOSIS — E559 Vitamin D deficiency, unspecified: Secondary | ICD-10-CM | POA: Diagnosis not present

## 2018-10-21 DIAGNOSIS — I872 Venous insufficiency (chronic) (peripheral): Secondary | ICD-10-CM | POA: Diagnosis not present

## 2018-10-21 DIAGNOSIS — E7800 Pure hypercholesterolemia, unspecified: Secondary | ICD-10-CM | POA: Diagnosis not present

## 2018-10-21 DIAGNOSIS — I129 Hypertensive chronic kidney disease with stage 1 through stage 4 chronic kidney disease, or unspecified chronic kidney disease: Secondary | ICD-10-CM | POA: Diagnosis not present

## 2018-10-21 DIAGNOSIS — N183 Chronic kidney disease, stage 3 (moderate): Secondary | ICD-10-CM | POA: Diagnosis not present

## 2018-10-21 DIAGNOSIS — M545 Low back pain: Secondary | ICD-10-CM | POA: Diagnosis not present

## 2018-10-22 DIAGNOSIS — E7800 Pure hypercholesterolemia, unspecified: Secondary | ICD-10-CM | POA: Diagnosis not present

## 2018-10-22 DIAGNOSIS — M545 Low back pain: Secondary | ICD-10-CM | POA: Diagnosis not present

## 2018-10-22 DIAGNOSIS — L97811 Non-pressure chronic ulcer of other part of right lower leg limited to breakdown of skin: Secondary | ICD-10-CM | POA: Diagnosis not present

## 2018-10-22 DIAGNOSIS — L97221 Non-pressure chronic ulcer of left calf limited to breakdown of skin: Secondary | ICD-10-CM | POA: Diagnosis not present

## 2018-10-22 DIAGNOSIS — N183 Chronic kidney disease, stage 3 (moderate): Secondary | ICD-10-CM | POA: Diagnosis not present

## 2018-10-22 DIAGNOSIS — E559 Vitamin D deficiency, unspecified: Secondary | ICD-10-CM | POA: Diagnosis not present

## 2018-10-22 DIAGNOSIS — I872 Venous insufficiency (chronic) (peripheral): Secondary | ICD-10-CM | POA: Diagnosis not present

## 2018-10-22 DIAGNOSIS — I129 Hypertensive chronic kidney disease with stage 1 through stage 4 chronic kidney disease, or unspecified chronic kidney disease: Secondary | ICD-10-CM | POA: Diagnosis not present

## 2018-10-25 DIAGNOSIS — L97221 Non-pressure chronic ulcer of left calf limited to breakdown of skin: Secondary | ICD-10-CM | POA: Diagnosis not present

## 2018-10-25 DIAGNOSIS — I129 Hypertensive chronic kidney disease with stage 1 through stage 4 chronic kidney disease, or unspecified chronic kidney disease: Secondary | ICD-10-CM | POA: Diagnosis not present

## 2018-10-25 DIAGNOSIS — N183 Chronic kidney disease, stage 3 (moderate): Secondary | ICD-10-CM | POA: Diagnosis not present

## 2018-10-25 DIAGNOSIS — L97811 Non-pressure chronic ulcer of other part of right lower leg limited to breakdown of skin: Secondary | ICD-10-CM | POA: Diagnosis not present

## 2018-10-25 DIAGNOSIS — M545 Low back pain: Secondary | ICD-10-CM | POA: Diagnosis not present

## 2018-10-25 DIAGNOSIS — E7800 Pure hypercholesterolemia, unspecified: Secondary | ICD-10-CM | POA: Diagnosis not present

## 2018-10-25 DIAGNOSIS — I872 Venous insufficiency (chronic) (peripheral): Secondary | ICD-10-CM | POA: Diagnosis not present

## 2018-10-25 DIAGNOSIS — E559 Vitamin D deficiency, unspecified: Secondary | ICD-10-CM | POA: Diagnosis not present

## 2018-10-29 DIAGNOSIS — L97811 Non-pressure chronic ulcer of other part of right lower leg limited to breakdown of skin: Secondary | ICD-10-CM | POA: Diagnosis not present

## 2018-10-29 DIAGNOSIS — I129 Hypertensive chronic kidney disease with stage 1 through stage 4 chronic kidney disease, or unspecified chronic kidney disease: Secondary | ICD-10-CM | POA: Diagnosis not present

## 2018-10-29 DIAGNOSIS — I872 Venous insufficiency (chronic) (peripheral): Secondary | ICD-10-CM | POA: Diagnosis not present

## 2018-10-29 DIAGNOSIS — N183 Chronic kidney disease, stage 3 (moderate): Secondary | ICD-10-CM | POA: Diagnosis not present

## 2018-10-29 DIAGNOSIS — E559 Vitamin D deficiency, unspecified: Secondary | ICD-10-CM | POA: Diagnosis not present

## 2018-10-29 DIAGNOSIS — E7800 Pure hypercholesterolemia, unspecified: Secondary | ICD-10-CM | POA: Diagnosis not present

## 2018-10-29 DIAGNOSIS — M545 Low back pain: Secondary | ICD-10-CM | POA: Diagnosis not present

## 2018-10-29 DIAGNOSIS — L97221 Non-pressure chronic ulcer of left calf limited to breakdown of skin: Secondary | ICD-10-CM | POA: Diagnosis not present

## 2018-11-01 DIAGNOSIS — E7800 Pure hypercholesterolemia, unspecified: Secondary | ICD-10-CM | POA: Diagnosis not present

## 2018-11-01 DIAGNOSIS — L97811 Non-pressure chronic ulcer of other part of right lower leg limited to breakdown of skin: Secondary | ICD-10-CM | POA: Diagnosis not present

## 2018-11-01 DIAGNOSIS — E559 Vitamin D deficiency, unspecified: Secondary | ICD-10-CM | POA: Diagnosis not present

## 2018-11-01 DIAGNOSIS — L97221 Non-pressure chronic ulcer of left calf limited to breakdown of skin: Secondary | ICD-10-CM | POA: Diagnosis not present

## 2018-11-01 DIAGNOSIS — I129 Hypertensive chronic kidney disease with stage 1 through stage 4 chronic kidney disease, or unspecified chronic kidney disease: Secondary | ICD-10-CM | POA: Diagnosis not present

## 2018-11-01 DIAGNOSIS — N183 Chronic kidney disease, stage 3 (moderate): Secondary | ICD-10-CM | POA: Diagnosis not present

## 2018-11-01 DIAGNOSIS — M545 Low back pain: Secondary | ICD-10-CM | POA: Diagnosis not present

## 2018-11-01 DIAGNOSIS — I872 Venous insufficiency (chronic) (peripheral): Secondary | ICD-10-CM | POA: Diagnosis not present

## 2018-11-04 DIAGNOSIS — N183 Chronic kidney disease, stage 3 (moderate): Secondary | ICD-10-CM | POA: Diagnosis not present

## 2018-11-04 DIAGNOSIS — E7800 Pure hypercholesterolemia, unspecified: Secondary | ICD-10-CM | POA: Diagnosis not present

## 2018-11-04 DIAGNOSIS — E559 Vitamin D deficiency, unspecified: Secondary | ICD-10-CM | POA: Diagnosis not present

## 2018-11-04 DIAGNOSIS — I872 Venous insufficiency (chronic) (peripheral): Secondary | ICD-10-CM | POA: Diagnosis not present

## 2018-11-04 DIAGNOSIS — I129 Hypertensive chronic kidney disease with stage 1 through stage 4 chronic kidney disease, or unspecified chronic kidney disease: Secondary | ICD-10-CM | POA: Diagnosis not present

## 2018-11-04 DIAGNOSIS — L97221 Non-pressure chronic ulcer of left calf limited to breakdown of skin: Secondary | ICD-10-CM | POA: Diagnosis not present

## 2018-11-04 DIAGNOSIS — L97811 Non-pressure chronic ulcer of other part of right lower leg limited to breakdown of skin: Secondary | ICD-10-CM | POA: Diagnosis not present

## 2018-11-04 DIAGNOSIS — M545 Low back pain: Secondary | ICD-10-CM | POA: Diagnosis not present

## 2018-11-06 DIAGNOSIS — E559 Vitamin D deficiency, unspecified: Secondary | ICD-10-CM | POA: Diagnosis not present

## 2018-11-06 DIAGNOSIS — M545 Low back pain: Secondary | ICD-10-CM | POA: Diagnosis not present

## 2018-11-06 DIAGNOSIS — I872 Venous insufficiency (chronic) (peripheral): Secondary | ICD-10-CM | POA: Diagnosis not present

## 2018-11-06 DIAGNOSIS — L97811 Non-pressure chronic ulcer of other part of right lower leg limited to breakdown of skin: Secondary | ICD-10-CM | POA: Diagnosis not present

## 2018-11-06 DIAGNOSIS — L97221 Non-pressure chronic ulcer of left calf limited to breakdown of skin: Secondary | ICD-10-CM | POA: Diagnosis not present

## 2018-11-06 DIAGNOSIS — N183 Chronic kidney disease, stage 3 (moderate): Secondary | ICD-10-CM | POA: Diagnosis not present

## 2018-11-06 DIAGNOSIS — I129 Hypertensive chronic kidney disease with stage 1 through stage 4 chronic kidney disease, or unspecified chronic kidney disease: Secondary | ICD-10-CM | POA: Diagnosis not present

## 2018-11-06 DIAGNOSIS — E7800 Pure hypercholesterolemia, unspecified: Secondary | ICD-10-CM | POA: Diagnosis not present

## 2018-11-08 DIAGNOSIS — E7800 Pure hypercholesterolemia, unspecified: Secondary | ICD-10-CM | POA: Diagnosis not present

## 2018-11-08 DIAGNOSIS — M545 Low back pain: Secondary | ICD-10-CM | POA: Diagnosis not present

## 2018-11-08 DIAGNOSIS — L97811 Non-pressure chronic ulcer of other part of right lower leg limited to breakdown of skin: Secondary | ICD-10-CM | POA: Diagnosis not present

## 2018-11-08 DIAGNOSIS — I129 Hypertensive chronic kidney disease with stage 1 through stage 4 chronic kidney disease, or unspecified chronic kidney disease: Secondary | ICD-10-CM | POA: Diagnosis not present

## 2018-11-08 DIAGNOSIS — I872 Venous insufficiency (chronic) (peripheral): Secondary | ICD-10-CM | POA: Diagnosis not present

## 2018-11-08 DIAGNOSIS — L97221 Non-pressure chronic ulcer of left calf limited to breakdown of skin: Secondary | ICD-10-CM | POA: Diagnosis not present

## 2018-11-08 DIAGNOSIS — E559 Vitamin D deficiency, unspecified: Secondary | ICD-10-CM | POA: Diagnosis not present

## 2018-11-08 DIAGNOSIS — N183 Chronic kidney disease, stage 3 (moderate): Secondary | ICD-10-CM | POA: Diagnosis not present

## 2018-11-11 DIAGNOSIS — M545 Low back pain: Secondary | ICD-10-CM | POA: Diagnosis not present

## 2018-11-11 DIAGNOSIS — L97221 Non-pressure chronic ulcer of left calf limited to breakdown of skin: Secondary | ICD-10-CM | POA: Diagnosis not present

## 2018-11-11 DIAGNOSIS — I872 Venous insufficiency (chronic) (peripheral): Secondary | ICD-10-CM | POA: Diagnosis not present

## 2018-11-11 DIAGNOSIS — E7800 Pure hypercholesterolemia, unspecified: Secondary | ICD-10-CM | POA: Diagnosis not present

## 2018-11-11 DIAGNOSIS — E559 Vitamin D deficiency, unspecified: Secondary | ICD-10-CM | POA: Diagnosis not present

## 2018-11-11 DIAGNOSIS — L97811 Non-pressure chronic ulcer of other part of right lower leg limited to breakdown of skin: Secondary | ICD-10-CM | POA: Diagnosis not present

## 2018-11-11 DIAGNOSIS — I129 Hypertensive chronic kidney disease with stage 1 through stage 4 chronic kidney disease, or unspecified chronic kidney disease: Secondary | ICD-10-CM | POA: Diagnosis not present

## 2018-11-11 DIAGNOSIS — N183 Chronic kidney disease, stage 3 (moderate): Secondary | ICD-10-CM | POA: Diagnosis not present

## 2018-11-13 DIAGNOSIS — I129 Hypertensive chronic kidney disease with stage 1 through stage 4 chronic kidney disease, or unspecified chronic kidney disease: Secondary | ICD-10-CM | POA: Diagnosis not present

## 2018-11-13 DIAGNOSIS — I872 Venous insufficiency (chronic) (peripheral): Secondary | ICD-10-CM | POA: Diagnosis not present

## 2018-11-13 DIAGNOSIS — E559 Vitamin D deficiency, unspecified: Secondary | ICD-10-CM | POA: Diagnosis not present

## 2018-11-13 DIAGNOSIS — L97221 Non-pressure chronic ulcer of left calf limited to breakdown of skin: Secondary | ICD-10-CM | POA: Diagnosis not present

## 2018-11-13 DIAGNOSIS — L97811 Non-pressure chronic ulcer of other part of right lower leg limited to breakdown of skin: Secondary | ICD-10-CM | POA: Diagnosis not present

## 2018-11-13 DIAGNOSIS — E7800 Pure hypercholesterolemia, unspecified: Secondary | ICD-10-CM | POA: Diagnosis not present

## 2018-11-13 DIAGNOSIS — N183 Chronic kidney disease, stage 3 (moderate): Secondary | ICD-10-CM | POA: Diagnosis not present

## 2018-11-13 DIAGNOSIS — M545 Low back pain: Secondary | ICD-10-CM | POA: Diagnosis not present

## 2018-11-15 DIAGNOSIS — L97811 Non-pressure chronic ulcer of other part of right lower leg limited to breakdown of skin: Secondary | ICD-10-CM | POA: Diagnosis not present

## 2018-11-15 DIAGNOSIS — E559 Vitamin D deficiency, unspecified: Secondary | ICD-10-CM | POA: Diagnosis not present

## 2018-11-15 DIAGNOSIS — E7800 Pure hypercholesterolemia, unspecified: Secondary | ICD-10-CM | POA: Diagnosis not present

## 2018-11-15 DIAGNOSIS — I129 Hypertensive chronic kidney disease with stage 1 through stage 4 chronic kidney disease, or unspecified chronic kidney disease: Secondary | ICD-10-CM | POA: Diagnosis not present

## 2018-11-15 DIAGNOSIS — L97221 Non-pressure chronic ulcer of left calf limited to breakdown of skin: Secondary | ICD-10-CM | POA: Diagnosis not present

## 2018-11-15 DIAGNOSIS — M545 Low back pain: Secondary | ICD-10-CM | POA: Diagnosis not present

## 2018-11-15 DIAGNOSIS — I872 Venous insufficiency (chronic) (peripheral): Secondary | ICD-10-CM | POA: Diagnosis not present

## 2018-11-15 DIAGNOSIS — N183 Chronic kidney disease, stage 3 (moderate): Secondary | ICD-10-CM | POA: Diagnosis not present

## 2018-11-19 DIAGNOSIS — N183 Chronic kidney disease, stage 3 (moderate): Secondary | ICD-10-CM | POA: Diagnosis not present

## 2018-11-19 DIAGNOSIS — L97221 Non-pressure chronic ulcer of left calf limited to breakdown of skin: Secondary | ICD-10-CM | POA: Diagnosis not present

## 2018-11-19 DIAGNOSIS — I129 Hypertensive chronic kidney disease with stage 1 through stage 4 chronic kidney disease, or unspecified chronic kidney disease: Secondary | ICD-10-CM | POA: Diagnosis not present

## 2018-11-19 DIAGNOSIS — I872 Venous insufficiency (chronic) (peripheral): Secondary | ICD-10-CM | POA: Diagnosis not present

## 2018-11-19 DIAGNOSIS — E7800 Pure hypercholesterolemia, unspecified: Secondary | ICD-10-CM | POA: Diagnosis not present

## 2018-11-19 DIAGNOSIS — L97811 Non-pressure chronic ulcer of other part of right lower leg limited to breakdown of skin: Secondary | ICD-10-CM | POA: Diagnosis not present

## 2018-11-19 DIAGNOSIS — M545 Low back pain: Secondary | ICD-10-CM | POA: Diagnosis not present

## 2018-11-19 DIAGNOSIS — E559 Vitamin D deficiency, unspecified: Secondary | ICD-10-CM | POA: Diagnosis not present

## 2018-11-20 DIAGNOSIS — E559 Vitamin D deficiency, unspecified: Secondary | ICD-10-CM | POA: Diagnosis not present

## 2018-11-20 DIAGNOSIS — E7800 Pure hypercholesterolemia, unspecified: Secondary | ICD-10-CM | POA: Diagnosis not present

## 2018-11-20 DIAGNOSIS — N183 Chronic kidney disease, stage 3 (moderate): Secondary | ICD-10-CM | POA: Diagnosis not present

## 2018-11-20 DIAGNOSIS — M545 Low back pain: Secondary | ICD-10-CM | POA: Diagnosis not present

## 2018-11-20 DIAGNOSIS — L97811 Non-pressure chronic ulcer of other part of right lower leg limited to breakdown of skin: Secondary | ICD-10-CM | POA: Diagnosis not present

## 2018-11-20 DIAGNOSIS — I872 Venous insufficiency (chronic) (peripheral): Secondary | ICD-10-CM | POA: Diagnosis not present

## 2018-11-20 DIAGNOSIS — L97221 Non-pressure chronic ulcer of left calf limited to breakdown of skin: Secondary | ICD-10-CM | POA: Diagnosis not present

## 2018-11-20 DIAGNOSIS — I129 Hypertensive chronic kidney disease with stage 1 through stage 4 chronic kidney disease, or unspecified chronic kidney disease: Secondary | ICD-10-CM | POA: Diagnosis not present

## 2018-11-22 DIAGNOSIS — I10 Essential (primary) hypertension: Secondary | ICD-10-CM | POA: Diagnosis not present

## 2018-11-22 DIAGNOSIS — N183 Chronic kidney disease, stage 3 unspecified: Secondary | ICD-10-CM | POA: Diagnosis not present

## 2018-11-22 DIAGNOSIS — Z6826 Body mass index (BMI) 26.0-26.9, adult: Secondary | ICD-10-CM | POA: Diagnosis not present

## 2018-11-22 DIAGNOSIS — L97811 Non-pressure chronic ulcer of other part of right lower leg limited to breakdown of skin: Secondary | ICD-10-CM | POA: Diagnosis not present

## 2018-11-22 DIAGNOSIS — I129 Hypertensive chronic kidney disease with stage 1 through stage 4 chronic kidney disease, or unspecified chronic kidney disease: Secondary | ICD-10-CM | POA: Diagnosis not present

## 2018-11-22 DIAGNOSIS — L97221 Non-pressure chronic ulcer of left calf limited to breakdown of skin: Secondary | ICD-10-CM | POA: Diagnosis not present

## 2018-11-22 DIAGNOSIS — M545 Low back pain: Secondary | ICD-10-CM | POA: Diagnosis not present

## 2018-11-22 DIAGNOSIS — E7800 Pure hypercholesterolemia, unspecified: Secondary | ICD-10-CM | POA: Diagnosis not present

## 2018-11-22 DIAGNOSIS — I872 Venous insufficiency (chronic) (peripheral): Secondary | ICD-10-CM | POA: Diagnosis not present

## 2018-11-22 DIAGNOSIS — E559 Vitamin D deficiency, unspecified: Secondary | ICD-10-CM | POA: Diagnosis not present

## 2018-11-22 DIAGNOSIS — L98499 Non-pressure chronic ulcer of skin of other sites with unspecified severity: Secondary | ICD-10-CM | POA: Diagnosis not present

## 2018-11-22 DIAGNOSIS — Z299 Encounter for prophylactic measures, unspecified: Secondary | ICD-10-CM | POA: Diagnosis not present

## 2018-11-25 DIAGNOSIS — I872 Venous insufficiency (chronic) (peripheral): Secondary | ICD-10-CM | POA: Diagnosis not present

## 2018-11-25 DIAGNOSIS — E7800 Pure hypercholesterolemia, unspecified: Secondary | ICD-10-CM | POA: Diagnosis not present

## 2018-11-25 DIAGNOSIS — I129 Hypertensive chronic kidney disease with stage 1 through stage 4 chronic kidney disease, or unspecified chronic kidney disease: Secondary | ICD-10-CM | POA: Diagnosis not present

## 2018-11-25 DIAGNOSIS — L97221 Non-pressure chronic ulcer of left calf limited to breakdown of skin: Secondary | ICD-10-CM | POA: Diagnosis not present

## 2018-11-25 DIAGNOSIS — N183 Chronic kidney disease, stage 3 unspecified: Secondary | ICD-10-CM | POA: Diagnosis not present

## 2018-11-25 DIAGNOSIS — M545 Low back pain: Secondary | ICD-10-CM | POA: Diagnosis not present

## 2018-11-25 DIAGNOSIS — L97811 Non-pressure chronic ulcer of other part of right lower leg limited to breakdown of skin: Secondary | ICD-10-CM | POA: Diagnosis not present

## 2018-11-25 DIAGNOSIS — E559 Vitamin D deficiency, unspecified: Secondary | ICD-10-CM | POA: Diagnosis not present

## 2018-11-27 DIAGNOSIS — L97221 Non-pressure chronic ulcer of left calf limited to breakdown of skin: Secondary | ICD-10-CM | POA: Diagnosis not present

## 2018-11-27 DIAGNOSIS — I872 Venous insufficiency (chronic) (peripheral): Secondary | ICD-10-CM | POA: Diagnosis not present

## 2018-11-27 DIAGNOSIS — N183 Chronic kidney disease, stage 3 unspecified: Secondary | ICD-10-CM | POA: Diagnosis not present

## 2018-11-27 DIAGNOSIS — I129 Hypertensive chronic kidney disease with stage 1 through stage 4 chronic kidney disease, or unspecified chronic kidney disease: Secondary | ICD-10-CM | POA: Diagnosis not present

## 2018-11-27 DIAGNOSIS — E7800 Pure hypercholesterolemia, unspecified: Secondary | ICD-10-CM | POA: Diagnosis not present

## 2018-11-27 DIAGNOSIS — E559 Vitamin D deficiency, unspecified: Secondary | ICD-10-CM | POA: Diagnosis not present

## 2018-11-27 DIAGNOSIS — L97811 Non-pressure chronic ulcer of other part of right lower leg limited to breakdown of skin: Secondary | ICD-10-CM | POA: Diagnosis not present

## 2018-11-27 DIAGNOSIS — M545 Low back pain: Secondary | ICD-10-CM | POA: Diagnosis not present

## 2018-11-29 DIAGNOSIS — E559 Vitamin D deficiency, unspecified: Secondary | ICD-10-CM | POA: Diagnosis not present

## 2018-11-29 DIAGNOSIS — E7800 Pure hypercholesterolemia, unspecified: Secondary | ICD-10-CM | POA: Diagnosis not present

## 2018-11-29 DIAGNOSIS — L97811 Non-pressure chronic ulcer of other part of right lower leg limited to breakdown of skin: Secondary | ICD-10-CM | POA: Diagnosis not present

## 2018-11-29 DIAGNOSIS — I872 Venous insufficiency (chronic) (peripheral): Secondary | ICD-10-CM | POA: Diagnosis not present

## 2018-11-29 DIAGNOSIS — I129 Hypertensive chronic kidney disease with stage 1 through stage 4 chronic kidney disease, or unspecified chronic kidney disease: Secondary | ICD-10-CM | POA: Diagnosis not present

## 2018-11-29 DIAGNOSIS — M545 Low back pain: Secondary | ICD-10-CM | POA: Diagnosis not present

## 2018-11-29 DIAGNOSIS — L97221 Non-pressure chronic ulcer of left calf limited to breakdown of skin: Secondary | ICD-10-CM | POA: Diagnosis not present

## 2018-11-29 DIAGNOSIS — N183 Chronic kidney disease, stage 3 unspecified: Secondary | ICD-10-CM | POA: Diagnosis not present

## 2018-12-02 DIAGNOSIS — I872 Venous insufficiency (chronic) (peripheral): Secondary | ICD-10-CM | POA: Diagnosis not present

## 2018-12-02 DIAGNOSIS — I129 Hypertensive chronic kidney disease with stage 1 through stage 4 chronic kidney disease, or unspecified chronic kidney disease: Secondary | ICD-10-CM | POA: Diagnosis not present

## 2018-12-02 DIAGNOSIS — E7800 Pure hypercholesterolemia, unspecified: Secondary | ICD-10-CM | POA: Diagnosis not present

## 2018-12-02 DIAGNOSIS — E559 Vitamin D deficiency, unspecified: Secondary | ICD-10-CM | POA: Diagnosis not present

## 2018-12-02 DIAGNOSIS — N183 Chronic kidney disease, stage 3 unspecified: Secondary | ICD-10-CM | POA: Diagnosis not present

## 2018-12-02 DIAGNOSIS — M545 Low back pain: Secondary | ICD-10-CM | POA: Diagnosis not present

## 2018-12-02 DIAGNOSIS — L97811 Non-pressure chronic ulcer of other part of right lower leg limited to breakdown of skin: Secondary | ICD-10-CM | POA: Diagnosis not present

## 2018-12-02 DIAGNOSIS — L97221 Non-pressure chronic ulcer of left calf limited to breakdown of skin: Secondary | ICD-10-CM | POA: Diagnosis not present

## 2018-12-05 DIAGNOSIS — E559 Vitamin D deficiency, unspecified: Secondary | ICD-10-CM | POA: Diagnosis not present

## 2018-12-05 DIAGNOSIS — M545 Low back pain: Secondary | ICD-10-CM | POA: Diagnosis not present

## 2018-12-05 DIAGNOSIS — L97221 Non-pressure chronic ulcer of left calf limited to breakdown of skin: Secondary | ICD-10-CM | POA: Diagnosis not present

## 2018-12-05 DIAGNOSIS — N183 Chronic kidney disease, stage 3 unspecified: Secondary | ICD-10-CM | POA: Diagnosis not present

## 2018-12-05 DIAGNOSIS — I872 Venous insufficiency (chronic) (peripheral): Secondary | ICD-10-CM | POA: Diagnosis not present

## 2018-12-05 DIAGNOSIS — I129 Hypertensive chronic kidney disease with stage 1 through stage 4 chronic kidney disease, or unspecified chronic kidney disease: Secondary | ICD-10-CM | POA: Diagnosis not present

## 2018-12-05 DIAGNOSIS — E7800 Pure hypercholesterolemia, unspecified: Secondary | ICD-10-CM | POA: Diagnosis not present

## 2018-12-05 DIAGNOSIS — L97811 Non-pressure chronic ulcer of other part of right lower leg limited to breakdown of skin: Secondary | ICD-10-CM | POA: Diagnosis not present

## 2018-12-06 DIAGNOSIS — E559 Vitamin D deficiency, unspecified: Secondary | ICD-10-CM | POA: Diagnosis not present

## 2018-12-06 DIAGNOSIS — E7800 Pure hypercholesterolemia, unspecified: Secondary | ICD-10-CM | POA: Diagnosis not present

## 2018-12-06 DIAGNOSIS — I872 Venous insufficiency (chronic) (peripheral): Secondary | ICD-10-CM | POA: Diagnosis not present

## 2018-12-06 DIAGNOSIS — L97221 Non-pressure chronic ulcer of left calf limited to breakdown of skin: Secondary | ICD-10-CM | POA: Diagnosis not present

## 2018-12-06 DIAGNOSIS — L97811 Non-pressure chronic ulcer of other part of right lower leg limited to breakdown of skin: Secondary | ICD-10-CM | POA: Diagnosis not present

## 2018-12-06 DIAGNOSIS — I129 Hypertensive chronic kidney disease with stage 1 through stage 4 chronic kidney disease, or unspecified chronic kidney disease: Secondary | ICD-10-CM | POA: Diagnosis not present

## 2018-12-06 DIAGNOSIS — M545 Low back pain: Secondary | ICD-10-CM | POA: Diagnosis not present

## 2018-12-06 DIAGNOSIS — N183 Chronic kidney disease, stage 3 unspecified: Secondary | ICD-10-CM | POA: Diagnosis not present

## 2018-12-09 DIAGNOSIS — I872 Venous insufficiency (chronic) (peripheral): Secondary | ICD-10-CM | POA: Diagnosis not present

## 2018-12-09 DIAGNOSIS — N183 Chronic kidney disease, stage 3 unspecified: Secondary | ICD-10-CM | POA: Diagnosis not present

## 2018-12-09 DIAGNOSIS — L97811 Non-pressure chronic ulcer of other part of right lower leg limited to breakdown of skin: Secondary | ICD-10-CM | POA: Diagnosis not present

## 2018-12-09 DIAGNOSIS — E7800 Pure hypercholesterolemia, unspecified: Secondary | ICD-10-CM | POA: Diagnosis not present

## 2018-12-09 DIAGNOSIS — I129 Hypertensive chronic kidney disease with stage 1 through stage 4 chronic kidney disease, or unspecified chronic kidney disease: Secondary | ICD-10-CM | POA: Diagnosis not present

## 2018-12-09 DIAGNOSIS — E559 Vitamin D deficiency, unspecified: Secondary | ICD-10-CM | POA: Diagnosis not present

## 2018-12-09 DIAGNOSIS — L97221 Non-pressure chronic ulcer of left calf limited to breakdown of skin: Secondary | ICD-10-CM | POA: Diagnosis not present

## 2018-12-09 DIAGNOSIS — M545 Low back pain: Secondary | ICD-10-CM | POA: Diagnosis not present

## 2018-12-11 DIAGNOSIS — E559 Vitamin D deficiency, unspecified: Secondary | ICD-10-CM | POA: Diagnosis not present

## 2018-12-11 DIAGNOSIS — L97221 Non-pressure chronic ulcer of left calf limited to breakdown of skin: Secondary | ICD-10-CM | POA: Diagnosis not present

## 2018-12-11 DIAGNOSIS — M545 Low back pain: Secondary | ICD-10-CM | POA: Diagnosis not present

## 2018-12-11 DIAGNOSIS — E7800 Pure hypercholesterolemia, unspecified: Secondary | ICD-10-CM | POA: Diagnosis not present

## 2018-12-11 DIAGNOSIS — I129 Hypertensive chronic kidney disease with stage 1 through stage 4 chronic kidney disease, or unspecified chronic kidney disease: Secondary | ICD-10-CM | POA: Diagnosis not present

## 2018-12-11 DIAGNOSIS — N183 Chronic kidney disease, stage 3 unspecified: Secondary | ICD-10-CM | POA: Diagnosis not present

## 2018-12-11 DIAGNOSIS — I872 Venous insufficiency (chronic) (peripheral): Secondary | ICD-10-CM | POA: Diagnosis not present

## 2018-12-11 DIAGNOSIS — L97811 Non-pressure chronic ulcer of other part of right lower leg limited to breakdown of skin: Secondary | ICD-10-CM | POA: Diagnosis not present

## 2018-12-13 DIAGNOSIS — E7800 Pure hypercholesterolemia, unspecified: Secondary | ICD-10-CM | POA: Diagnosis not present

## 2018-12-13 DIAGNOSIS — E559 Vitamin D deficiency, unspecified: Secondary | ICD-10-CM | POA: Diagnosis not present

## 2018-12-13 DIAGNOSIS — I129 Hypertensive chronic kidney disease with stage 1 through stage 4 chronic kidney disease, or unspecified chronic kidney disease: Secondary | ICD-10-CM | POA: Diagnosis not present

## 2018-12-13 DIAGNOSIS — L97221 Non-pressure chronic ulcer of left calf limited to breakdown of skin: Secondary | ICD-10-CM | POA: Diagnosis not present

## 2018-12-13 DIAGNOSIS — M545 Low back pain: Secondary | ICD-10-CM | POA: Diagnosis not present

## 2018-12-13 DIAGNOSIS — I872 Venous insufficiency (chronic) (peripheral): Secondary | ICD-10-CM | POA: Diagnosis not present

## 2018-12-13 DIAGNOSIS — L97811 Non-pressure chronic ulcer of other part of right lower leg limited to breakdown of skin: Secondary | ICD-10-CM | POA: Diagnosis not present

## 2018-12-13 DIAGNOSIS — N183 Chronic kidney disease, stage 3 unspecified: Secondary | ICD-10-CM | POA: Diagnosis not present

## 2018-12-16 DIAGNOSIS — N183 Chronic kidney disease, stage 3 unspecified: Secondary | ICD-10-CM | POA: Diagnosis not present

## 2018-12-16 DIAGNOSIS — L97811 Non-pressure chronic ulcer of other part of right lower leg limited to breakdown of skin: Secondary | ICD-10-CM | POA: Diagnosis not present

## 2018-12-16 DIAGNOSIS — E7800 Pure hypercholesterolemia, unspecified: Secondary | ICD-10-CM | POA: Diagnosis not present

## 2018-12-16 DIAGNOSIS — M545 Low back pain: Secondary | ICD-10-CM | POA: Diagnosis not present

## 2018-12-16 DIAGNOSIS — I129 Hypertensive chronic kidney disease with stage 1 through stage 4 chronic kidney disease, or unspecified chronic kidney disease: Secondary | ICD-10-CM | POA: Diagnosis not present

## 2018-12-16 DIAGNOSIS — I872 Venous insufficiency (chronic) (peripheral): Secondary | ICD-10-CM | POA: Diagnosis not present

## 2018-12-16 DIAGNOSIS — E559 Vitamin D deficiency, unspecified: Secondary | ICD-10-CM | POA: Diagnosis not present

## 2018-12-16 DIAGNOSIS — L97221 Non-pressure chronic ulcer of left calf limited to breakdown of skin: Secondary | ICD-10-CM | POA: Diagnosis not present

## 2018-12-18 DIAGNOSIS — L97811 Non-pressure chronic ulcer of other part of right lower leg limited to breakdown of skin: Secondary | ICD-10-CM | POA: Diagnosis not present

## 2018-12-18 DIAGNOSIS — E559 Vitamin D deficiency, unspecified: Secondary | ICD-10-CM | POA: Diagnosis not present

## 2018-12-18 DIAGNOSIS — M545 Low back pain: Secondary | ICD-10-CM | POA: Diagnosis not present

## 2018-12-18 DIAGNOSIS — L97221 Non-pressure chronic ulcer of left calf limited to breakdown of skin: Secondary | ICD-10-CM | POA: Diagnosis not present

## 2018-12-18 DIAGNOSIS — N183 Chronic kidney disease, stage 3 unspecified: Secondary | ICD-10-CM | POA: Diagnosis not present

## 2018-12-18 DIAGNOSIS — I129 Hypertensive chronic kidney disease with stage 1 through stage 4 chronic kidney disease, or unspecified chronic kidney disease: Secondary | ICD-10-CM | POA: Diagnosis not present

## 2018-12-18 DIAGNOSIS — E7800 Pure hypercholesterolemia, unspecified: Secondary | ICD-10-CM | POA: Diagnosis not present

## 2018-12-18 DIAGNOSIS — I872 Venous insufficiency (chronic) (peripheral): Secondary | ICD-10-CM | POA: Diagnosis not present

## 2018-12-20 DIAGNOSIS — E559 Vitamin D deficiency, unspecified: Secondary | ICD-10-CM | POA: Diagnosis not present

## 2018-12-20 DIAGNOSIS — L97221 Non-pressure chronic ulcer of left calf limited to breakdown of skin: Secondary | ICD-10-CM | POA: Diagnosis not present

## 2018-12-20 DIAGNOSIS — N183 Chronic kidney disease, stage 3 unspecified: Secondary | ICD-10-CM | POA: Diagnosis not present

## 2018-12-20 DIAGNOSIS — E7800 Pure hypercholesterolemia, unspecified: Secondary | ICD-10-CM | POA: Diagnosis not present

## 2018-12-20 DIAGNOSIS — I129 Hypertensive chronic kidney disease with stage 1 through stage 4 chronic kidney disease, or unspecified chronic kidney disease: Secondary | ICD-10-CM | POA: Diagnosis not present

## 2018-12-20 DIAGNOSIS — I872 Venous insufficiency (chronic) (peripheral): Secondary | ICD-10-CM | POA: Diagnosis not present

## 2018-12-20 DIAGNOSIS — L97811 Non-pressure chronic ulcer of other part of right lower leg limited to breakdown of skin: Secondary | ICD-10-CM | POA: Diagnosis not present

## 2018-12-20 DIAGNOSIS — M545 Low back pain: Secondary | ICD-10-CM | POA: Diagnosis not present

## 2018-12-23 DIAGNOSIS — L97811 Non-pressure chronic ulcer of other part of right lower leg limited to breakdown of skin: Secondary | ICD-10-CM | POA: Diagnosis not present

## 2018-12-23 DIAGNOSIS — E7800 Pure hypercholesterolemia, unspecified: Secondary | ICD-10-CM | POA: Diagnosis not present

## 2018-12-23 DIAGNOSIS — I129 Hypertensive chronic kidney disease with stage 1 through stage 4 chronic kidney disease, or unspecified chronic kidney disease: Secondary | ICD-10-CM | POA: Diagnosis not present

## 2018-12-23 DIAGNOSIS — E559 Vitamin D deficiency, unspecified: Secondary | ICD-10-CM | POA: Diagnosis not present

## 2018-12-23 DIAGNOSIS — M545 Low back pain: Secondary | ICD-10-CM | POA: Diagnosis not present

## 2018-12-23 DIAGNOSIS — L97221 Non-pressure chronic ulcer of left calf limited to breakdown of skin: Secondary | ICD-10-CM | POA: Diagnosis not present

## 2018-12-23 DIAGNOSIS — I872 Venous insufficiency (chronic) (peripheral): Secondary | ICD-10-CM | POA: Diagnosis not present

## 2018-12-23 DIAGNOSIS — N183 Chronic kidney disease, stage 3 unspecified: Secondary | ICD-10-CM | POA: Diagnosis not present

## 2018-12-25 DIAGNOSIS — L97221 Non-pressure chronic ulcer of left calf limited to breakdown of skin: Secondary | ICD-10-CM | POA: Diagnosis not present

## 2018-12-25 DIAGNOSIS — M545 Low back pain: Secondary | ICD-10-CM | POA: Diagnosis not present

## 2018-12-25 DIAGNOSIS — E7800 Pure hypercholesterolemia, unspecified: Secondary | ICD-10-CM | POA: Diagnosis not present

## 2018-12-25 DIAGNOSIS — I872 Venous insufficiency (chronic) (peripheral): Secondary | ICD-10-CM | POA: Diagnosis not present

## 2018-12-25 DIAGNOSIS — I129 Hypertensive chronic kidney disease with stage 1 through stage 4 chronic kidney disease, or unspecified chronic kidney disease: Secondary | ICD-10-CM | POA: Diagnosis not present

## 2018-12-25 DIAGNOSIS — E559 Vitamin D deficiency, unspecified: Secondary | ICD-10-CM | POA: Diagnosis not present

## 2018-12-25 DIAGNOSIS — L97811 Non-pressure chronic ulcer of other part of right lower leg limited to breakdown of skin: Secondary | ICD-10-CM | POA: Diagnosis not present

## 2018-12-25 DIAGNOSIS — N183 Chronic kidney disease, stage 3 unspecified: Secondary | ICD-10-CM | POA: Diagnosis not present

## 2018-12-27 DIAGNOSIS — I129 Hypertensive chronic kidney disease with stage 1 through stage 4 chronic kidney disease, or unspecified chronic kidney disease: Secondary | ICD-10-CM | POA: Diagnosis not present

## 2018-12-27 DIAGNOSIS — L97811 Non-pressure chronic ulcer of other part of right lower leg limited to breakdown of skin: Secondary | ICD-10-CM | POA: Diagnosis not present

## 2018-12-27 DIAGNOSIS — M545 Low back pain: Secondary | ICD-10-CM | POA: Diagnosis not present

## 2018-12-27 DIAGNOSIS — N183 Chronic kidney disease, stage 3 unspecified: Secondary | ICD-10-CM | POA: Diagnosis not present

## 2018-12-27 DIAGNOSIS — L97221 Non-pressure chronic ulcer of left calf limited to breakdown of skin: Secondary | ICD-10-CM | POA: Diagnosis not present

## 2018-12-27 DIAGNOSIS — E7800 Pure hypercholesterolemia, unspecified: Secondary | ICD-10-CM | POA: Diagnosis not present

## 2018-12-27 DIAGNOSIS — E559 Vitamin D deficiency, unspecified: Secondary | ICD-10-CM | POA: Diagnosis not present

## 2018-12-27 DIAGNOSIS — I872 Venous insufficiency (chronic) (peripheral): Secondary | ICD-10-CM | POA: Diagnosis not present

## 2018-12-30 DIAGNOSIS — I872 Venous insufficiency (chronic) (peripheral): Secondary | ICD-10-CM | POA: Diagnosis not present

## 2018-12-30 DIAGNOSIS — L97221 Non-pressure chronic ulcer of left calf limited to breakdown of skin: Secondary | ICD-10-CM | POA: Diagnosis not present

## 2018-12-30 DIAGNOSIS — E7800 Pure hypercholesterolemia, unspecified: Secondary | ICD-10-CM | POA: Diagnosis not present

## 2018-12-30 DIAGNOSIS — M545 Low back pain: Secondary | ICD-10-CM | POA: Diagnosis not present

## 2018-12-30 DIAGNOSIS — I129 Hypertensive chronic kidney disease with stage 1 through stage 4 chronic kidney disease, or unspecified chronic kidney disease: Secondary | ICD-10-CM | POA: Diagnosis not present

## 2018-12-30 DIAGNOSIS — E559 Vitamin D deficiency, unspecified: Secondary | ICD-10-CM | POA: Diagnosis not present

## 2018-12-30 DIAGNOSIS — N183 Chronic kidney disease, stage 3 unspecified: Secondary | ICD-10-CM | POA: Diagnosis not present

## 2018-12-30 DIAGNOSIS — L97811 Non-pressure chronic ulcer of other part of right lower leg limited to breakdown of skin: Secondary | ICD-10-CM | POA: Diagnosis not present

## 2019-01-01 DIAGNOSIS — L97221 Non-pressure chronic ulcer of left calf limited to breakdown of skin: Secondary | ICD-10-CM | POA: Diagnosis not present

## 2019-01-01 DIAGNOSIS — E7800 Pure hypercholesterolemia, unspecified: Secondary | ICD-10-CM | POA: Diagnosis not present

## 2019-01-01 DIAGNOSIS — L97811 Non-pressure chronic ulcer of other part of right lower leg limited to breakdown of skin: Secondary | ICD-10-CM | POA: Diagnosis not present

## 2019-01-01 DIAGNOSIS — I129 Hypertensive chronic kidney disease with stage 1 through stage 4 chronic kidney disease, or unspecified chronic kidney disease: Secondary | ICD-10-CM | POA: Diagnosis not present

## 2019-01-01 DIAGNOSIS — I872 Venous insufficiency (chronic) (peripheral): Secondary | ICD-10-CM | POA: Diagnosis not present

## 2019-01-01 DIAGNOSIS — E559 Vitamin D deficiency, unspecified: Secondary | ICD-10-CM | POA: Diagnosis not present

## 2019-01-01 DIAGNOSIS — N183 Chronic kidney disease, stage 3 unspecified: Secondary | ICD-10-CM | POA: Diagnosis not present

## 2019-01-01 DIAGNOSIS — M545 Low back pain: Secondary | ICD-10-CM | POA: Diagnosis not present

## 2019-01-02 DIAGNOSIS — H903 Sensorineural hearing loss, bilateral: Secondary | ICD-10-CM | POA: Diagnosis not present

## 2019-01-02 DIAGNOSIS — H6123 Impacted cerumen, bilateral: Secondary | ICD-10-CM | POA: Diagnosis not present

## 2019-01-02 DIAGNOSIS — Z23 Encounter for immunization: Secondary | ICD-10-CM | POA: Diagnosis not present

## 2019-01-03 DIAGNOSIS — E7800 Pure hypercholesterolemia, unspecified: Secondary | ICD-10-CM | POA: Diagnosis not present

## 2019-01-03 DIAGNOSIS — E559 Vitamin D deficiency, unspecified: Secondary | ICD-10-CM | POA: Diagnosis not present

## 2019-01-03 DIAGNOSIS — N183 Chronic kidney disease, stage 3 unspecified: Secondary | ICD-10-CM | POA: Diagnosis not present

## 2019-01-03 DIAGNOSIS — M545 Low back pain: Secondary | ICD-10-CM | POA: Diagnosis not present

## 2019-01-03 DIAGNOSIS — L97811 Non-pressure chronic ulcer of other part of right lower leg limited to breakdown of skin: Secondary | ICD-10-CM | POA: Diagnosis not present

## 2019-01-03 DIAGNOSIS — I872 Venous insufficiency (chronic) (peripheral): Secondary | ICD-10-CM | POA: Diagnosis not present

## 2019-01-03 DIAGNOSIS — L97221 Non-pressure chronic ulcer of left calf limited to breakdown of skin: Secondary | ICD-10-CM | POA: Diagnosis not present

## 2019-01-03 DIAGNOSIS — I129 Hypertensive chronic kidney disease with stage 1 through stage 4 chronic kidney disease, or unspecified chronic kidney disease: Secondary | ICD-10-CM | POA: Diagnosis not present

## 2019-01-06 DIAGNOSIS — I129 Hypertensive chronic kidney disease with stage 1 through stage 4 chronic kidney disease, or unspecified chronic kidney disease: Secondary | ICD-10-CM | POA: Diagnosis not present

## 2019-01-06 DIAGNOSIS — E559 Vitamin D deficiency, unspecified: Secondary | ICD-10-CM | POA: Diagnosis not present

## 2019-01-06 DIAGNOSIS — L97811 Non-pressure chronic ulcer of other part of right lower leg limited to breakdown of skin: Secondary | ICD-10-CM | POA: Diagnosis not present

## 2019-01-06 DIAGNOSIS — M545 Low back pain: Secondary | ICD-10-CM | POA: Diagnosis not present

## 2019-01-06 DIAGNOSIS — I872 Venous insufficiency (chronic) (peripheral): Secondary | ICD-10-CM | POA: Diagnosis not present

## 2019-01-06 DIAGNOSIS — E7800 Pure hypercholesterolemia, unspecified: Secondary | ICD-10-CM | POA: Diagnosis not present

## 2019-01-06 DIAGNOSIS — N183 Chronic kidney disease, stage 3 unspecified: Secondary | ICD-10-CM | POA: Diagnosis not present

## 2019-01-06 DIAGNOSIS — L97221 Non-pressure chronic ulcer of left calf limited to breakdown of skin: Secondary | ICD-10-CM | POA: Diagnosis not present

## 2019-01-08 DIAGNOSIS — N183 Chronic kidney disease, stage 3 unspecified: Secondary | ICD-10-CM | POA: Diagnosis not present

## 2019-01-08 DIAGNOSIS — M545 Low back pain: Secondary | ICD-10-CM | POA: Diagnosis not present

## 2019-01-08 DIAGNOSIS — L97811 Non-pressure chronic ulcer of other part of right lower leg limited to breakdown of skin: Secondary | ICD-10-CM | POA: Diagnosis not present

## 2019-01-08 DIAGNOSIS — E7800 Pure hypercholesterolemia, unspecified: Secondary | ICD-10-CM | POA: Diagnosis not present

## 2019-01-08 DIAGNOSIS — E559 Vitamin D deficiency, unspecified: Secondary | ICD-10-CM | POA: Diagnosis not present

## 2019-01-08 DIAGNOSIS — L97221 Non-pressure chronic ulcer of left calf limited to breakdown of skin: Secondary | ICD-10-CM | POA: Diagnosis not present

## 2019-01-08 DIAGNOSIS — I129 Hypertensive chronic kidney disease with stage 1 through stage 4 chronic kidney disease, or unspecified chronic kidney disease: Secondary | ICD-10-CM | POA: Diagnosis not present

## 2019-01-08 DIAGNOSIS — I872 Venous insufficiency (chronic) (peripheral): Secondary | ICD-10-CM | POA: Diagnosis not present

## 2019-01-10 DIAGNOSIS — M545 Low back pain: Secondary | ICD-10-CM | POA: Diagnosis not present

## 2019-01-10 DIAGNOSIS — N183 Chronic kidney disease, stage 3 unspecified: Secondary | ICD-10-CM | POA: Diagnosis not present

## 2019-01-10 DIAGNOSIS — L97221 Non-pressure chronic ulcer of left calf limited to breakdown of skin: Secondary | ICD-10-CM | POA: Diagnosis not present

## 2019-01-10 DIAGNOSIS — I872 Venous insufficiency (chronic) (peripheral): Secondary | ICD-10-CM | POA: Diagnosis not present

## 2019-01-10 DIAGNOSIS — I129 Hypertensive chronic kidney disease with stage 1 through stage 4 chronic kidney disease, or unspecified chronic kidney disease: Secondary | ICD-10-CM | POA: Diagnosis not present

## 2019-01-10 DIAGNOSIS — E7800 Pure hypercholesterolemia, unspecified: Secondary | ICD-10-CM | POA: Diagnosis not present

## 2019-01-10 DIAGNOSIS — E559 Vitamin D deficiency, unspecified: Secondary | ICD-10-CM | POA: Diagnosis not present

## 2019-01-10 DIAGNOSIS — L97811 Non-pressure chronic ulcer of other part of right lower leg limited to breakdown of skin: Secondary | ICD-10-CM | POA: Diagnosis not present

## 2019-01-13 DIAGNOSIS — L97811 Non-pressure chronic ulcer of other part of right lower leg limited to breakdown of skin: Secondary | ICD-10-CM | POA: Diagnosis not present

## 2019-01-13 DIAGNOSIS — I129 Hypertensive chronic kidney disease with stage 1 through stage 4 chronic kidney disease, or unspecified chronic kidney disease: Secondary | ICD-10-CM | POA: Diagnosis not present

## 2019-01-13 DIAGNOSIS — M545 Low back pain: Secondary | ICD-10-CM | POA: Diagnosis not present

## 2019-01-13 DIAGNOSIS — L97221 Non-pressure chronic ulcer of left calf limited to breakdown of skin: Secondary | ICD-10-CM | POA: Diagnosis not present

## 2019-01-13 DIAGNOSIS — N183 Chronic kidney disease, stage 3 unspecified: Secondary | ICD-10-CM | POA: Diagnosis not present

## 2019-01-13 DIAGNOSIS — E7800 Pure hypercholesterolemia, unspecified: Secondary | ICD-10-CM | POA: Diagnosis not present

## 2019-01-13 DIAGNOSIS — I872 Venous insufficiency (chronic) (peripheral): Secondary | ICD-10-CM | POA: Diagnosis not present

## 2019-01-13 DIAGNOSIS — E559 Vitamin D deficiency, unspecified: Secondary | ICD-10-CM | POA: Diagnosis not present

## 2019-01-14 DIAGNOSIS — M6281 Muscle weakness (generalized): Secondary | ICD-10-CM | POA: Diagnosis not present

## 2019-01-14 DIAGNOSIS — I959 Hypotension, unspecified: Secondary | ICD-10-CM | POA: Diagnosis not present

## 2019-01-14 DIAGNOSIS — L97829 Non-pressure chronic ulcer of other part of left lower leg with unspecified severity: Secondary | ICD-10-CM | POA: Diagnosis not present

## 2019-01-14 DIAGNOSIS — I872 Venous insufficiency (chronic) (peripheral): Secondary | ICD-10-CM | POA: Diagnosis not present

## 2019-01-14 DIAGNOSIS — I890 Lymphedema, not elsewhere classified: Secondary | ICD-10-CM | POA: Diagnosis not present

## 2019-01-14 DIAGNOSIS — L03116 Cellulitis of left lower limb: Secondary | ICD-10-CM | POA: Diagnosis not present

## 2019-01-14 DIAGNOSIS — L97929 Non-pressure chronic ulcer of unspecified part of left lower leg with unspecified severity: Secondary | ICD-10-CM | POA: Diagnosis not present

## 2019-01-14 DIAGNOSIS — R69 Illness, unspecified: Secondary | ICD-10-CM | POA: Diagnosis not present

## 2019-01-14 DIAGNOSIS — Z20828 Contact with and (suspected) exposure to other viral communicable diseases: Secondary | ICD-10-CM | POA: Diagnosis not present

## 2019-01-14 DIAGNOSIS — N184 Chronic kidney disease, stage 4 (severe): Secondary | ICD-10-CM | POA: Diagnosis not present

## 2019-01-14 DIAGNOSIS — L89894 Pressure ulcer of other site, stage 4: Secondary | ICD-10-CM | POA: Diagnosis not present

## 2019-01-14 DIAGNOSIS — B961 Klebsiella pneumoniae [K. pneumoniae] as the cause of diseases classified elsewhere: Secondary | ICD-10-CM | POA: Diagnosis not present

## 2019-01-14 DIAGNOSIS — I82403 Acute embolism and thrombosis of unspecified deep veins of lower extremity, bilateral: Secondary | ICD-10-CM | POA: Diagnosis not present

## 2019-01-14 DIAGNOSIS — R2689 Other abnormalities of gait and mobility: Secondary | ICD-10-CM | POA: Diagnosis not present

## 2019-01-14 DIAGNOSIS — L97919 Non-pressure chronic ulcer of unspecified part of right lower leg with unspecified severity: Secondary | ICD-10-CM | POA: Diagnosis not present

## 2019-01-14 DIAGNOSIS — B964 Proteus (mirabilis) (morganii) as the cause of diseases classified elsewhere: Secondary | ICD-10-CM | POA: Diagnosis not present

## 2019-01-14 DIAGNOSIS — Z7401 Bed confinement status: Secondary | ICD-10-CM | POA: Diagnosis not present

## 2019-01-14 DIAGNOSIS — R609 Edema, unspecified: Secondary | ICD-10-CM | POA: Diagnosis not present

## 2019-01-14 DIAGNOSIS — L03115 Cellulitis of right lower limb: Secondary | ICD-10-CM | POA: Diagnosis not present

## 2019-01-14 DIAGNOSIS — R52 Pain, unspecified: Secondary | ICD-10-CM | POA: Diagnosis not present

## 2019-01-14 DIAGNOSIS — S90851A Superficial foreign body, right foot, initial encounter: Secondary | ICD-10-CM | POA: Diagnosis not present

## 2019-01-14 DIAGNOSIS — I129 Hypertensive chronic kidney disease with stage 1 through stage 4 chronic kidney disease, or unspecified chronic kidney disease: Secondary | ICD-10-CM | POA: Diagnosis not present

## 2019-01-14 DIAGNOSIS — R41841 Cognitive communication deficit: Secondary | ICD-10-CM | POA: Diagnosis not present

## 2019-01-15 DIAGNOSIS — I82403 Acute embolism and thrombosis of unspecified deep veins of lower extremity, bilateral: Secondary | ICD-10-CM | POA: Diagnosis not present

## 2019-01-15 DIAGNOSIS — L03115 Cellulitis of right lower limb: Secondary | ICD-10-CM | POA: Diagnosis not present

## 2019-01-15 DIAGNOSIS — N184 Chronic kidney disease, stage 4 (severe): Secondary | ICD-10-CM | POA: Diagnosis not present

## 2019-01-15 DIAGNOSIS — L89894 Pressure ulcer of other site, stage 4: Secondary | ICD-10-CM | POA: Diagnosis not present

## 2019-01-15 DIAGNOSIS — L97829 Non-pressure chronic ulcer of other part of left lower leg with unspecified severity: Secondary | ICD-10-CM | POA: Diagnosis not present

## 2019-01-16 DIAGNOSIS — L89894 Pressure ulcer of other site, stage 4: Secondary | ICD-10-CM | POA: Diagnosis not present

## 2019-01-16 DIAGNOSIS — L97829 Non-pressure chronic ulcer of other part of left lower leg with unspecified severity: Secondary | ICD-10-CM | POA: Diagnosis not present

## 2019-01-16 DIAGNOSIS — N184 Chronic kidney disease, stage 4 (severe): Secondary | ICD-10-CM | POA: Diagnosis not present

## 2019-01-16 DIAGNOSIS — L97929 Non-pressure chronic ulcer of unspecified part of left lower leg with unspecified severity: Secondary | ICD-10-CM | POA: Diagnosis not present

## 2019-01-16 DIAGNOSIS — L03115 Cellulitis of right lower limb: Secondary | ICD-10-CM | POA: Diagnosis not present

## 2019-01-17 DIAGNOSIS — L97829 Non-pressure chronic ulcer of other part of left lower leg with unspecified severity: Secondary | ICD-10-CM | POA: Diagnosis not present

## 2019-01-17 DIAGNOSIS — L03115 Cellulitis of right lower limb: Secondary | ICD-10-CM | POA: Diagnosis not present

## 2019-01-17 DIAGNOSIS — L89894 Pressure ulcer of other site, stage 4: Secondary | ICD-10-CM | POA: Diagnosis not present

## 2019-01-17 DIAGNOSIS — N184 Chronic kidney disease, stage 4 (severe): Secondary | ICD-10-CM | POA: Diagnosis not present

## 2019-01-18 DIAGNOSIS — L89894 Pressure ulcer of other site, stage 4: Secondary | ICD-10-CM | POA: Diagnosis not present

## 2019-01-18 DIAGNOSIS — L97829 Non-pressure chronic ulcer of other part of left lower leg with unspecified severity: Secondary | ICD-10-CM | POA: Diagnosis not present

## 2019-01-18 DIAGNOSIS — L03115 Cellulitis of right lower limb: Secondary | ICD-10-CM | POA: Diagnosis not present

## 2019-01-18 DIAGNOSIS — L03116 Cellulitis of left lower limb: Secondary | ICD-10-CM | POA: Diagnosis not present

## 2019-01-18 DIAGNOSIS — N184 Chronic kidney disease, stage 4 (severe): Secondary | ICD-10-CM | POA: Diagnosis not present

## 2019-01-19 DIAGNOSIS — N184 Chronic kidney disease, stage 4 (severe): Secondary | ICD-10-CM | POA: Diagnosis not present

## 2019-01-19 DIAGNOSIS — I872 Venous insufficiency (chronic) (peripheral): Secondary | ICD-10-CM | POA: Diagnosis not present

## 2019-01-19 DIAGNOSIS — L97919 Non-pressure chronic ulcer of unspecified part of right lower leg with unspecified severity: Secondary | ICD-10-CM | POA: Diagnosis not present

## 2019-01-19 DIAGNOSIS — L03115 Cellulitis of right lower limb: Secondary | ICD-10-CM | POA: Diagnosis not present

## 2019-01-19 DIAGNOSIS — L97829 Non-pressure chronic ulcer of other part of left lower leg with unspecified severity: Secondary | ICD-10-CM | POA: Diagnosis not present

## 2019-01-19 DIAGNOSIS — S90851A Superficial foreign body, right foot, initial encounter: Secondary | ICD-10-CM | POA: Diagnosis not present

## 2019-01-19 DIAGNOSIS — L89894 Pressure ulcer of other site, stage 4: Secondary | ICD-10-CM | POA: Diagnosis not present

## 2019-01-20 DIAGNOSIS — L97829 Non-pressure chronic ulcer of other part of left lower leg with unspecified severity: Secondary | ICD-10-CM | POA: Diagnosis not present

## 2019-01-20 DIAGNOSIS — L03115 Cellulitis of right lower limb: Secondary | ICD-10-CM | POA: Diagnosis not present

## 2019-01-20 DIAGNOSIS — L89894 Pressure ulcer of other site, stage 4: Secondary | ICD-10-CM | POA: Diagnosis not present

## 2019-01-20 DIAGNOSIS — N184 Chronic kidney disease, stage 4 (severe): Secondary | ICD-10-CM | POA: Diagnosis not present

## 2019-01-21 DIAGNOSIS — L03115 Cellulitis of right lower limb: Secondary | ICD-10-CM | POA: Diagnosis not present

## 2019-01-21 DIAGNOSIS — L89894 Pressure ulcer of other site, stage 4: Secondary | ICD-10-CM | POA: Diagnosis not present

## 2019-01-21 DIAGNOSIS — L97829 Non-pressure chronic ulcer of other part of left lower leg with unspecified severity: Secondary | ICD-10-CM | POA: Diagnosis not present

## 2019-01-21 DIAGNOSIS — N184 Chronic kidney disease, stage 4 (severe): Secondary | ICD-10-CM | POA: Diagnosis not present

## 2019-01-22 DIAGNOSIS — L8994 Pressure ulcer of unspecified site, stage 4: Secondary | ICD-10-CM | POA: Diagnosis not present

## 2019-01-22 DIAGNOSIS — L03115 Cellulitis of right lower limb: Secondary | ICD-10-CM | POA: Diagnosis not present

## 2019-01-22 DIAGNOSIS — R609 Edema, unspecified: Secondary | ICD-10-CM | POA: Diagnosis not present

## 2019-01-22 DIAGNOSIS — M6281 Muscle weakness (generalized): Secondary | ICD-10-CM | POA: Diagnosis not present

## 2019-01-22 DIAGNOSIS — R52 Pain, unspecified: Secondary | ICD-10-CM | POA: Diagnosis not present

## 2019-01-22 DIAGNOSIS — L89894 Pressure ulcer of other site, stage 4: Secondary | ICD-10-CM | POA: Diagnosis not present

## 2019-01-22 DIAGNOSIS — L97919 Non-pressure chronic ulcer of unspecified part of right lower leg with unspecified severity: Secondary | ICD-10-CM | POA: Diagnosis not present

## 2019-01-22 DIAGNOSIS — L97929 Non-pressure chronic ulcer of unspecified part of left lower leg with unspecified severity: Secondary | ICD-10-CM | POA: Diagnosis not present

## 2019-01-22 DIAGNOSIS — I890 Lymphedema, not elsewhere classified: Secondary | ICD-10-CM | POA: Diagnosis not present

## 2019-01-22 DIAGNOSIS — I872 Venous insufficiency (chronic) (peripheral): Secondary | ICD-10-CM | POA: Diagnosis not present

## 2019-01-22 DIAGNOSIS — L97829 Non-pressure chronic ulcer of other part of left lower leg with unspecified severity: Secondary | ICD-10-CM | POA: Diagnosis not present

## 2019-01-22 DIAGNOSIS — L0390 Cellulitis, unspecified: Secondary | ICD-10-CM | POA: Diagnosis not present

## 2019-01-22 DIAGNOSIS — I959 Hypotension, unspecified: Secondary | ICD-10-CM | POA: Diagnosis not present

## 2019-01-22 DIAGNOSIS — N183 Chronic kidney disease, stage 3 unspecified: Secondary | ICD-10-CM | POA: Diagnosis not present

## 2019-01-22 DIAGNOSIS — I129 Hypertensive chronic kidney disease with stage 1 through stage 4 chronic kidney disease, or unspecified chronic kidney disease: Secondary | ICD-10-CM | POA: Diagnosis not present

## 2019-01-22 DIAGNOSIS — H409 Unspecified glaucoma: Secondary | ICD-10-CM | POA: Diagnosis not present

## 2019-01-22 DIAGNOSIS — Z7401 Bed confinement status: Secondary | ICD-10-CM | POA: Diagnosis not present

## 2019-01-22 DIAGNOSIS — N184 Chronic kidney disease, stage 4 (severe): Secondary | ICD-10-CM | POA: Diagnosis not present

## 2019-01-22 DIAGNOSIS — M792 Neuralgia and neuritis, unspecified: Secondary | ICD-10-CM | POA: Diagnosis not present

## 2019-01-22 DIAGNOSIS — R41841 Cognitive communication deficit: Secondary | ICD-10-CM | POA: Diagnosis not present

## 2019-01-22 DIAGNOSIS — L03116 Cellulitis of left lower limb: Secondary | ICD-10-CM | POA: Diagnosis not present

## 2019-01-22 DIAGNOSIS — R2689 Other abnormalities of gait and mobility: Secondary | ICD-10-CM | POA: Diagnosis not present

## 2019-01-22 DIAGNOSIS — R69 Illness, unspecified: Secondary | ICD-10-CM | POA: Diagnosis not present

## 2019-01-22 DIAGNOSIS — L291 Pruritus scroti: Secondary | ICD-10-CM | POA: Diagnosis not present

## 2019-01-23 DIAGNOSIS — L03116 Cellulitis of left lower limb: Secondary | ICD-10-CM | POA: Diagnosis not present

## 2019-01-23 DIAGNOSIS — L89894 Pressure ulcer of other site, stage 4: Secondary | ICD-10-CM | POA: Diagnosis not present

## 2019-01-23 DIAGNOSIS — M792 Neuralgia and neuritis, unspecified: Secondary | ICD-10-CM | POA: Diagnosis not present

## 2019-01-23 DIAGNOSIS — L291 Pruritus scroti: Secondary | ICD-10-CM | POA: Diagnosis not present

## 2019-01-28 DIAGNOSIS — L03115 Cellulitis of right lower limb: Secondary | ICD-10-CM | POA: Diagnosis not present

## 2019-01-28 DIAGNOSIS — L97829 Non-pressure chronic ulcer of other part of left lower leg with unspecified severity: Secondary | ICD-10-CM | POA: Diagnosis not present

## 2019-01-28 DIAGNOSIS — L89894 Pressure ulcer of other site, stage 4: Secondary | ICD-10-CM | POA: Diagnosis not present

## 2019-01-28 DIAGNOSIS — I872 Venous insufficiency (chronic) (peripheral): Secondary | ICD-10-CM | POA: Diagnosis not present

## 2019-01-28 DIAGNOSIS — I890 Lymphedema, not elsewhere classified: Secondary | ICD-10-CM | POA: Diagnosis not present

## 2019-02-11 DIAGNOSIS — L8994 Pressure ulcer of unspecified site, stage 4: Secondary | ICD-10-CM | POA: Diagnosis not present

## 2019-02-11 DIAGNOSIS — I129 Hypertensive chronic kidney disease with stage 1 through stage 4 chronic kidney disease, or unspecified chronic kidney disease: Secondary | ICD-10-CM | POA: Diagnosis not present

## 2019-02-11 DIAGNOSIS — L97919 Non-pressure chronic ulcer of unspecified part of right lower leg with unspecified severity: Secondary | ICD-10-CM | POA: Diagnosis not present

## 2019-02-11 DIAGNOSIS — L03116 Cellulitis of left lower limb: Secondary | ICD-10-CM | POA: Diagnosis not present

## 2019-02-11 DIAGNOSIS — I872 Venous insufficiency (chronic) (peripheral): Secondary | ICD-10-CM | POA: Diagnosis not present

## 2019-02-11 DIAGNOSIS — L03115 Cellulitis of right lower limb: Secondary | ICD-10-CM | POA: Diagnosis not present

## 2019-02-11 DIAGNOSIS — L97929 Non-pressure chronic ulcer of unspecified part of left lower leg with unspecified severity: Secondary | ICD-10-CM | POA: Diagnosis not present

## 2019-02-11 DIAGNOSIS — M6281 Muscle weakness (generalized): Secondary | ICD-10-CM | POA: Diagnosis not present

## 2019-02-11 DIAGNOSIS — R2689 Other abnormalities of gait and mobility: Secondary | ICD-10-CM | POA: Diagnosis not present

## 2019-02-11 DIAGNOSIS — I890 Lymphedema, not elsewhere classified: Secondary | ICD-10-CM | POA: Diagnosis not present

## 2019-02-11 DIAGNOSIS — L0390 Cellulitis, unspecified: Secondary | ICD-10-CM | POA: Diagnosis not present

## 2019-02-11 DIAGNOSIS — R41841 Cognitive communication deficit: Secondary | ICD-10-CM | POA: Diagnosis not present

## 2019-02-11 DIAGNOSIS — L97829 Non-pressure chronic ulcer of other part of left lower leg with unspecified severity: Secondary | ICD-10-CM | POA: Diagnosis not present

## 2019-02-18 DIAGNOSIS — L0390 Cellulitis, unspecified: Secondary | ICD-10-CM | POA: Diagnosis not present

## 2019-02-18 DIAGNOSIS — I890 Lymphedema, not elsewhere classified: Secondary | ICD-10-CM | POA: Diagnosis not present

## 2019-02-18 DIAGNOSIS — I872 Venous insufficiency (chronic) (peripheral): Secondary | ICD-10-CM | POA: Diagnosis not present

## 2019-02-18 DIAGNOSIS — L8994 Pressure ulcer of unspecified site, stage 4: Secondary | ICD-10-CM | POA: Diagnosis not present

## 2019-02-18 DIAGNOSIS — L97829 Non-pressure chronic ulcer of other part of left lower leg with unspecified severity: Secondary | ICD-10-CM | POA: Diagnosis not present

## 2019-02-26 DIAGNOSIS — L03116 Cellulitis of left lower limb: Secondary | ICD-10-CM | POA: Diagnosis not present

## 2019-02-26 DIAGNOSIS — N183 Chronic kidney disease, stage 3 unspecified: Secondary | ICD-10-CM | POA: Diagnosis not present

## 2019-02-26 DIAGNOSIS — I129 Hypertensive chronic kidney disease with stage 1 through stage 4 chronic kidney disease, or unspecified chronic kidney disease: Secondary | ICD-10-CM | POA: Diagnosis not present

## 2019-02-26 DIAGNOSIS — H409 Unspecified glaucoma: Secondary | ICD-10-CM | POA: Diagnosis not present

## 2019-02-26 DIAGNOSIS — R41841 Cognitive communication deficit: Secondary | ICD-10-CM | POA: Diagnosis not present

## 2019-02-26 DIAGNOSIS — I872 Venous insufficiency (chronic) (peripheral): Secondary | ICD-10-CM | POA: Diagnosis not present

## 2019-02-26 DIAGNOSIS — L97919 Non-pressure chronic ulcer of unspecified part of right lower leg with unspecified severity: Secondary | ICD-10-CM | POA: Diagnosis not present

## 2019-02-26 DIAGNOSIS — R69 Illness, unspecified: Secondary | ICD-10-CM | POA: Diagnosis not present

## 2019-02-26 DIAGNOSIS — L03115 Cellulitis of right lower limb: Secondary | ICD-10-CM | POA: Diagnosis not present

## 2019-02-26 DIAGNOSIS — I890 Lymphedema, not elsewhere classified: Secondary | ICD-10-CM | POA: Diagnosis not present

## 2019-02-28 DIAGNOSIS — I129 Hypertensive chronic kidney disease with stage 1 through stage 4 chronic kidney disease, or unspecified chronic kidney disease: Secondary | ICD-10-CM | POA: Diagnosis not present

## 2019-02-28 DIAGNOSIS — R41841 Cognitive communication deficit: Secondary | ICD-10-CM | POA: Diagnosis not present

## 2019-02-28 DIAGNOSIS — H409 Unspecified glaucoma: Secondary | ICD-10-CM | POA: Diagnosis not present

## 2019-02-28 DIAGNOSIS — I872 Venous insufficiency (chronic) (peripheral): Secondary | ICD-10-CM | POA: Diagnosis not present

## 2019-02-28 DIAGNOSIS — L97919 Non-pressure chronic ulcer of unspecified part of right lower leg with unspecified severity: Secondary | ICD-10-CM | POA: Diagnosis not present

## 2019-02-28 DIAGNOSIS — N183 Chronic kidney disease, stage 3 unspecified: Secondary | ICD-10-CM | POA: Diagnosis not present

## 2019-02-28 DIAGNOSIS — L03116 Cellulitis of left lower limb: Secondary | ICD-10-CM | POA: Diagnosis not present

## 2019-02-28 DIAGNOSIS — L03115 Cellulitis of right lower limb: Secondary | ICD-10-CM | POA: Diagnosis not present

## 2019-02-28 DIAGNOSIS — R69 Illness, unspecified: Secondary | ICD-10-CM | POA: Diagnosis not present

## 2019-02-28 DIAGNOSIS — I890 Lymphedema, not elsewhere classified: Secondary | ICD-10-CM | POA: Diagnosis not present

## 2019-03-03 DIAGNOSIS — I890 Lymphedema, not elsewhere classified: Secondary | ICD-10-CM | POA: Diagnosis not present

## 2019-03-03 DIAGNOSIS — I129 Hypertensive chronic kidney disease with stage 1 through stage 4 chronic kidney disease, or unspecified chronic kidney disease: Secondary | ICD-10-CM | POA: Diagnosis not present

## 2019-03-03 DIAGNOSIS — I872 Venous insufficiency (chronic) (peripheral): Secondary | ICD-10-CM | POA: Diagnosis not present

## 2019-03-03 DIAGNOSIS — R69 Illness, unspecified: Secondary | ICD-10-CM | POA: Diagnosis not present

## 2019-03-03 DIAGNOSIS — R41841 Cognitive communication deficit: Secondary | ICD-10-CM | POA: Diagnosis not present

## 2019-03-03 DIAGNOSIS — L97919 Non-pressure chronic ulcer of unspecified part of right lower leg with unspecified severity: Secondary | ICD-10-CM | POA: Diagnosis not present

## 2019-03-03 DIAGNOSIS — N183 Chronic kidney disease, stage 3 unspecified: Secondary | ICD-10-CM | POA: Diagnosis not present

## 2019-03-03 DIAGNOSIS — L03115 Cellulitis of right lower limb: Secondary | ICD-10-CM | POA: Diagnosis not present

## 2019-03-03 DIAGNOSIS — L03116 Cellulitis of left lower limb: Secondary | ICD-10-CM | POA: Diagnosis not present

## 2019-03-03 DIAGNOSIS — H409 Unspecified glaucoma: Secondary | ICD-10-CM | POA: Diagnosis not present

## 2019-03-05 DIAGNOSIS — I872 Venous insufficiency (chronic) (peripheral): Secondary | ICD-10-CM | POA: Diagnosis not present

## 2019-03-05 DIAGNOSIS — R69 Illness, unspecified: Secondary | ICD-10-CM | POA: Diagnosis not present

## 2019-03-05 DIAGNOSIS — R41841 Cognitive communication deficit: Secondary | ICD-10-CM | POA: Diagnosis not present

## 2019-03-05 DIAGNOSIS — N183 Chronic kidney disease, stage 3 unspecified: Secondary | ICD-10-CM | POA: Diagnosis not present

## 2019-03-05 DIAGNOSIS — L03115 Cellulitis of right lower limb: Secondary | ICD-10-CM | POA: Diagnosis not present

## 2019-03-05 DIAGNOSIS — L03116 Cellulitis of left lower limb: Secondary | ICD-10-CM | POA: Diagnosis not present

## 2019-03-05 DIAGNOSIS — I129 Hypertensive chronic kidney disease with stage 1 through stage 4 chronic kidney disease, or unspecified chronic kidney disease: Secondary | ICD-10-CM | POA: Diagnosis not present

## 2019-03-05 DIAGNOSIS — I890 Lymphedema, not elsewhere classified: Secondary | ICD-10-CM | POA: Diagnosis not present

## 2019-03-05 DIAGNOSIS — L97919 Non-pressure chronic ulcer of unspecified part of right lower leg with unspecified severity: Secondary | ICD-10-CM | POA: Diagnosis not present

## 2019-03-05 DIAGNOSIS — H409 Unspecified glaucoma: Secondary | ICD-10-CM | POA: Diagnosis not present

## 2019-03-10 DIAGNOSIS — I872 Venous insufficiency (chronic) (peripheral): Secondary | ICD-10-CM | POA: Diagnosis not present

## 2019-03-10 DIAGNOSIS — R41841 Cognitive communication deficit: Secondary | ICD-10-CM | POA: Diagnosis not present

## 2019-03-10 DIAGNOSIS — N183 Chronic kidney disease, stage 3 unspecified: Secondary | ICD-10-CM | POA: Diagnosis not present

## 2019-03-10 DIAGNOSIS — I129 Hypertensive chronic kidney disease with stage 1 through stage 4 chronic kidney disease, or unspecified chronic kidney disease: Secondary | ICD-10-CM | POA: Diagnosis not present

## 2019-03-10 DIAGNOSIS — H409 Unspecified glaucoma: Secondary | ICD-10-CM | POA: Diagnosis not present

## 2019-03-10 DIAGNOSIS — L03115 Cellulitis of right lower limb: Secondary | ICD-10-CM | POA: Diagnosis not present

## 2019-03-10 DIAGNOSIS — L97919 Non-pressure chronic ulcer of unspecified part of right lower leg with unspecified severity: Secondary | ICD-10-CM | POA: Diagnosis not present

## 2019-03-10 DIAGNOSIS — R69 Illness, unspecified: Secondary | ICD-10-CM | POA: Diagnosis not present

## 2019-03-10 DIAGNOSIS — I890 Lymphedema, not elsewhere classified: Secondary | ICD-10-CM | POA: Diagnosis not present

## 2019-03-10 DIAGNOSIS — L03116 Cellulitis of left lower limb: Secondary | ICD-10-CM | POA: Diagnosis not present

## 2019-03-12 DIAGNOSIS — I129 Hypertensive chronic kidney disease with stage 1 through stage 4 chronic kidney disease, or unspecified chronic kidney disease: Secondary | ICD-10-CM | POA: Diagnosis not present

## 2019-03-12 DIAGNOSIS — R69 Illness, unspecified: Secondary | ICD-10-CM | POA: Diagnosis not present

## 2019-03-12 DIAGNOSIS — H409 Unspecified glaucoma: Secondary | ICD-10-CM | POA: Diagnosis not present

## 2019-03-12 DIAGNOSIS — I890 Lymphedema, not elsewhere classified: Secondary | ICD-10-CM | POA: Diagnosis not present

## 2019-03-12 DIAGNOSIS — R41841 Cognitive communication deficit: Secondary | ICD-10-CM | POA: Diagnosis not present

## 2019-03-12 DIAGNOSIS — I872 Venous insufficiency (chronic) (peripheral): Secondary | ICD-10-CM | POA: Diagnosis not present

## 2019-03-12 DIAGNOSIS — L97919 Non-pressure chronic ulcer of unspecified part of right lower leg with unspecified severity: Secondary | ICD-10-CM | POA: Diagnosis not present

## 2019-03-12 DIAGNOSIS — L03115 Cellulitis of right lower limb: Secondary | ICD-10-CM | POA: Diagnosis not present

## 2019-03-12 DIAGNOSIS — N183 Chronic kidney disease, stage 3 unspecified: Secondary | ICD-10-CM | POA: Diagnosis not present

## 2019-03-12 DIAGNOSIS — L03116 Cellulitis of left lower limb: Secondary | ICD-10-CM | POA: Diagnosis not present

## 2019-03-20 DIAGNOSIS — I872 Venous insufficiency (chronic) (peripheral): Secondary | ICD-10-CM | POA: Diagnosis not present

## 2019-03-28 DIAGNOSIS — I872 Venous insufficiency (chronic) (peripheral): Secondary | ICD-10-CM | POA: Diagnosis not present

## 2019-03-29 ENCOUNTER — Other Ambulatory Visit: Payer: Self-pay | Admitting: Hematology and Oncology

## 2019-04-14 ENCOUNTER — Encounter (HOSPITAL_BASED_OUTPATIENT_CLINIC_OR_DEPARTMENT_OTHER): Payer: Medicare HMO | Admitting: Internal Medicine

## 2019-04-14 ENCOUNTER — Other Ambulatory Visit: Payer: Self-pay

## 2019-04-14 DIAGNOSIS — I890 Lymphedema, not elsewhere classified: Secondary | ICD-10-CM | POA: Diagnosis not present

## 2019-04-14 DIAGNOSIS — Z8349 Family history of other endocrine, nutritional and metabolic diseases: Secondary | ICD-10-CM | POA: Insufficient documentation

## 2019-04-14 DIAGNOSIS — Z8614 Personal history of Methicillin resistant Staphylococcus aureus infection: Secondary | ICD-10-CM | POA: Insufficient documentation

## 2019-04-14 DIAGNOSIS — Z170 Estrogen receptor positive status [ER+]: Secondary | ICD-10-CM | POA: Diagnosis not present

## 2019-04-14 DIAGNOSIS — H409 Unspecified glaucoma: Secondary | ICD-10-CM | POA: Insufficient documentation

## 2019-04-14 DIAGNOSIS — D649 Anemia, unspecified: Secondary | ICD-10-CM | POA: Diagnosis not present

## 2019-04-14 DIAGNOSIS — Z853 Personal history of malignant neoplasm of breast: Secondary | ICD-10-CM | POA: Insufficient documentation

## 2019-04-14 DIAGNOSIS — I87311 Chronic venous hypertension (idiopathic) with ulcer of right lower extremity: Secondary | ICD-10-CM | POA: Diagnosis not present

## 2019-04-14 DIAGNOSIS — Z8249 Family history of ischemic heart disease and other diseases of the circulatory system: Secondary | ICD-10-CM | POA: Insufficient documentation

## 2019-04-14 DIAGNOSIS — L97811 Non-pressure chronic ulcer of other part of right lower leg limited to breakdown of skin: Secondary | ICD-10-CM | POA: Diagnosis not present

## 2019-04-14 DIAGNOSIS — I872 Venous insufficiency (chronic) (peripheral): Secondary | ICD-10-CM | POA: Insufficient documentation

## 2019-04-14 DIAGNOSIS — Z809 Family history of malignant neoplasm, unspecified: Secondary | ICD-10-CM | POA: Diagnosis not present

## 2019-04-14 DIAGNOSIS — L97821 Non-pressure chronic ulcer of other part of left lower leg limited to breakdown of skin: Secondary | ICD-10-CM | POA: Diagnosis not present

## 2019-04-14 DIAGNOSIS — Z833 Family history of diabetes mellitus: Secondary | ICD-10-CM | POA: Diagnosis not present

## 2019-04-14 DIAGNOSIS — L97812 Non-pressure chronic ulcer of other part of right lower leg with fat layer exposed: Secondary | ICD-10-CM | POA: Insufficient documentation

## 2019-04-15 NOTE — Progress Notes (Addendum)
Erin [name], Erin [name] ([id number]) Visit Report for 04/14/2019 Allergy List Details Patient Name: Date of Service: Erin [name], Erin [name] 04/14/2019 1:15 PM Medical Record [id number] Patient Account Number: [id number] Date of Birth/Sex: Treating RN: May 31, 1930 (86 y.o. Erin [name] Primary Care Analeah Brame: [name] Other Clinician: Referring Taqwa Deem: Treating Arizbeth Cawthorn/Extender:Robson, [name], [name] Weeks in Treatment: 0 Allergies Active Allergies No Known Allergies Allergy Notes Electronic Signature(s) Signed: 04/15/2019 5:23:18 PM By: [name] RN Entered By: [name] on 04/14/2019 13:43:45 -------------------------------------------------------------------------------- Arrival Information Details Patient Name: Date of Service: Erin [name] 04/14/2019 1:15 PM Medical Record [id number] Patient Account Number: [id number] Date of Birth/Sex: Treating RN: 07-16-1930 (86 y.o. Erin [name] Primary Care Dajohn Ellender: [name] Other Clinician: Referring Adit Riddles: Treating Alvetta Hidrogo/Extender:Robson, [name], [name] in Treatment: 0 Visit Information Patient Arrived: Wheel Chair Arrival Time: 13:22 Accompanied By: friend Transfer Assistance: None Patient Identification Verified: Yes Secondary Verification Process Yes Completed: Patient Requires Transmission- No Based Precautions: Patient Has Alerts: Yes Patient Alerts: NON COMPRESSABLE History Since Last Visit All ordered tests and consults were completed: No Added or deleted any medications: No Any new allergies or adverse reactions: No Had a fall or experienced change in activities of daily living that may affect risk of falls: No Signs or symptoms of abuse/neglect since last visito No Hospitalized since last visit: No Implantable device outside of the clinic excluding cellular tissue based products placed in the center since last visit: No Has Dressing in Place as  Prescribed: Yes Electronic Signature(s) Signed: 04/15/2019 5:23:18 PM By: [name] RN Entered By: [name] on 04/14/2019 14:22:57 -------------------------------------------------------------------------------- Clinic Level of Care Assessment Details Patient Name: Date of Service: Erin [name], Erin [name] 04/14/2019 1:15 PM Medical Record [id number] Patient Account Number: [id number] Date of Birth/Sex: Treating RN: [birth year] (86 y.o. Erin [name] Primary Care Zyquan Crotty: [name] Other Clinician: Referring Raymone Pembroke: Treating Emmerson Taddei/Extender:Robson, [name], [name] Weeks in Treatment: 0 Clinic Level of Care Assessment Items TOOL 1 Quantity Score X - Use when EandM and Procedure is performed on INITIAL visit 1 0 ASSESSMENTS - Nursing Assessment / Reassessment X - General Physical Exam (combine w/ comprehensive assessment (listed just below) 1 20 when performed on new pt. evals) X - Comprehensive Assessment (HX, ROS, Risk Assessments, Wounds Hx, etc.) 1 25 ASSESSMENTS - Wound and Skin Assessment / Reassessment []  - Dermatologic / Skin Assessment (not related to wound area) 0 ASSESSMENTS - Ostomy and/or Continence Assessment and Care []  - Incontinence Assessment and Management 0 []  - Ostomy Care Assessment and Management (repouching, etc.) 0 PROCESS - Coordination of Care X - Simple Patient / Family Education for ongoing care 1 15 []  - Complex (extensive) Patient / Family Education for ongoing care 0 X - Staff obtains Programmer, systems, Records, Test Results / Process Orders 1 10 X - Staff telephones HHA, Nursing Homes / Clarify orders / etc 1 10 []  - Routine Transfer to another Facility (non-emergent condition) 0 []  - Routine Hospital Admission (non-emergent condition) 0 X - New Admissions / Biomedical engineer / Ordering NPWT, Apligraf, etc. 1 15 []  - Emergency Hospital Admission (emergent condition) 0 PROCESS - Special Needs []  - Pediatric / Minor Patient  Management 0 []  - Isolation Patient Management 0 []  - Hearing / Language / Visual special needs 0 []  - Assessment of Community assistance (transportation, D/C planning, etc.) 0 []  - Additional assistance / Altered mentation 0 []  - Support Surface(s) Assessment (bed, cushion, seat, etc.) 0 INTERVENTIONS - Miscellaneous []  - External ear exam 0 []  -  Patient Transfer (multiple staff / Civil Service fast streamer / Similar devices) 0 []  - Simple Staple / Suture removal (25 or less) 0 []  - Complex Staple / Suture removal (26 or more) 0 []  - Hypo/Hyperglycemic Management (do not check if billed separately) 0 []  - Ankle / Brachial Index (ABI) - do not check if billed separately 0 Has the patient been seen at the hospital within the last three years: Yes Total Score: 95 Level Of Care: New/Established - Level 3 Electronic Signature(s) Signed: 04/14/2019 6:39:44 PM By: [name] RN, BSN Entered By: [name] on 04/14/2019 18:22:49 -------------------------------------------------------------------------------- Compression Therapy Details Patient Name: Date of Service: Erin [name] 04/14/2019 1:15 PM Medical Record [id number] Patient Account Number: [id number] Date of Birth/Sex: Treating RN: 04-25-1930 (86 y.o. Erin [name] Primary Care Caprice Mccaffrey: [name] Other Clinician: Referring Felis Quillin: Treating Addysen Louth/Extender:Robson, [name], [name] Weeks in Treatment: 0 Compression Therapy Performed for Wound Wound #6 Right Lower Leg Assessment: Performed By: Clinician [name], RN Compression Type: Three Layer Post Procedure Diagnosis Same as Pre-procedure Electronic Signature(s) Signed: 04/14/2019 6:39:44 PM By: [name] RN, BSN Entered By: [name] on 04/14/2019 14:40:59 -------------------------------------------------------------------------------- Encounter Discharge Information Details Patient Name: Date of Service: Erin [name] 04/14/2019  1:15 PM Medical Record [id number] Patient Account Number: [id number] Date of Birth/Sex: Treating RN: 19-Nov-1930 (86 y.o. Erin [name] Primary Care Nancyann Cotterman: [name] Other Clinician: Referring Chanti Golubski: Treating Melvyn Hommes/Extender:Robson, [name], [name] in Treatment: 0 Encounter Discharge Information Items Post Procedure Vitals Discharge Condition: Stable Temperature (F): 98 Ambulatory Status: Wheelchair Pulse (bpm): 79 Discharge Destination: Home Respiratory Rate (breaths/min): 18 Transportation: Private Auto Blood Pressure (mmHg): 150/51 Accompanied By: friend Schedule Follow-up Appointment: Yes Clinical Summary of Care: Patient Declined Electronic Signature(s) Signed: 04/14/2019 6:01:02 PM By: Kela Millin Entered By: Kela Millin on 04/14/2019 15:52:29 -------------------------------------------------------------------------------- Lower Extremity Assessment Details Patient Name: Date of Service: Erin [name], Erin [name] 04/14/2019 1:15 PM Medical Record [id number] Patient Account Number: [id number] Date of Birth/Sex: Treating RN: 06-06-1930 (86 y.o. Erin [name] Primary Care Gracyn Allor: [name] Other Clinician: Referring Achille Xiang: Treating Lillyian Heidt/Extender:Robson, [name], [name] Weeks in Treatment: 0 Edema Assessment Assessed: [Left: No] [Right: No] E[Left: dema] [Right: :] Calf Left: Right: Point of Measurement: 35 cm From Medial Instep cm 36 cm Ankle Left: Right: Point of Measurement: 7 cm From Medial Instep cm 24 cm Electronic Signature(s) Signed: 04/15/2019 5:23:18 PM By: [name] RN Entered By: [name] on 04/14/2019 14:05:34 -------------------------------------------------------------------------------- Multi Wound Chart Details Patient Name: Date of Service: Erin [name] 04/14/2019 1:15 PM Medical Record [id number] Patient Account Number: [id number] Date of Birth/Sex: Treating  RN: 09-Sep-1930 (86 y.o. F) Primary Care Briscoe Daniello: [name] Other Clinician: Referring Jase Himmelberger: Treating Zada Haser/Extender:Robson, [name], [name] Weeks in Treatment: 0 Vital Signs Height(in): 60 Pulse(bpm): 40 Weight(lbs): 130 Blood Pressure(mmHg): 150/51 Body Mass Index(BMI): 25 Temperature(F): 98 Respiratory 18 Rate(breaths/min): Photos: [6:No Photos] [N/A:N/A] Wound Location: [6:Right Lower Leg] [N/A:N/A] Wounding Event: [6:Gradually Appeared] [N/A:N/A] Primary Etiology: [6:Lymphedema] [N/A:N/A] Comorbid History: [6:Cataracts, Glaucoma, Middle ear problems, Anemia, Hypertension] [N/A:N/A] Date Acquired: [6:03/14/2019] [N/A:N/A] Weeks of Treatment: [6:0] [N/A:N/A] Wound Status: [6:Open] [N/A:N/A] Measurements L x W x D 9.5x18x0.4 [N/A:N/A] (cm) Area (cm) : [6:134.303] [N/A:N/A] Volume (cm) : [6:53.721] [N/A:N/A] % Reduction in Area: [6:0.00%] [N/A:N/A] % Reduction in Volume: 0.00% [N/A:N/A] Classification: [6:Full Thickness Without Exposed Support Structures] [N/A:N/A] Exudate Amount: [6:Large] [N/A:N/A] Exudate Type: [6:Serosanguineous] [N/A:N/A] Exudate Color: [6:red, brown] [N/A:N/A] Wound Margin: [6:Well defined, not attached N/A] Granulation Amount: [6:Small (1-33%)] [N/A:N/A] Granulation Quality: [6:Red,  Pink] [N/A:N/A] Necrotic Amount: [6:Large (67-100%)] [N/A:N/A] Exposed Structures: [6:Fat Layer (Subcutaneous N/A Tissue) Exposed: Yes Fascia: No Tendon: No Muscle: No Joint: No Bone: No] Epithelialization: [6:None] [N/A:N/A] Debridement: [6:Debridement - Excisional] [N/A:N/A] Pre-procedure [6:14:35] [N/A:N/A] Verification/Time Out Taken: Pain Control: [6:Other] [N/A:N/A] Tissue Debrided: [6:Subcutaneous, Slough] [N/A:N/A] Level: [6:Skin/Subcutaneous Tissue] [N/A:N/A] Debridement Area (sq cm):171 [N/A:N/A] Instrument: [6:Curette] [N/A:N/A] Bleeding: [6:Moderate] [N/A:N/A] Hemostasis Achieved: [6:Pressure] [N/A:N/A] Procedural Pain: [6:5]  [N/A:N/A] Post Procedural Pain: [6:3] [N/A:N/A] Debridement Treatment Procedure was tolerated [N/A:N/A] Response: [6:well] Post Debridement [6:9.5x18x0.4] [N/A:N/A] Measurements L x W x D (cm) Post Debridement [6:53.721] [N/A:N/A] Volume: (cm) Procedures Performed: Compression Therapy [6:Debridement] [N/A:N/A] Treatment Notes Electronic Signature(s) Signed: 04/14/2019 6:41:12 PM By: Linton Ham MD Entered By: Linton Ham on 04/14/2019 14:51:55 -------------------------------------------------------------------------------- Multi-Disciplinary Care Plan Details Patient Name: Date of Service: Erin [name] 04/14/2019 1:15 PM Medical Record [id number] Patient Account Number: [id number] Date of Birth/Sex: Treating RN: 08/08/1930 (86 y.o. Erin [name] Primary Care Marabelle Cushman: [name] Other Clinician: Referring Ivar Domangue: Treating Isaish Alemu/Extender:Robson, [name], [name] Weeks in Treatment: 0 Active Inactive Abuse / Safety / Falls / Self Care Management Nursing Diagnoses: Potential for falls Potential for injury related to falls Goals: Patient will not experience any injury related to falls Date Initiated: 04/14/2019 Target Resolution Date: 05/16/2019 Goal Status: Active Patient/caregiver will verbalize/demonstrate measures taken to improve the patient's personal safety Date Initiated: 04/14/2019 Target Resolution Date: 05/16/2019 Goal Status: Active Patient/caregiver will verbalize/demonstrate measures taken to prevent injury and/or falls Date Initiated: 04/14/2019 Target Resolution Date: 05/16/2019 Goal Status: Active Interventions: Assess Activities of Daily Living upon admission and as needed Assess fall risk on admission and as needed Assess: immobility, friction, shearing, incontinence upon admission and as needed Assess impairment of mobility on admission and as needed per policy Assess personal safety and home safety (as indicated) on admission  and as needed Assess self care needs on admission and as needed Provide education on fall prevention Provide education on personal and home safety Notes: Venous Leg Ulcer Nursing Diagnoses: Actual venous Insuffiency (use after diagnosis is confirmed) Knowledge deficit related to disease process and management Goals: Patient will maintain optimal edema control Date Initiated: 04/14/2019 Target Resolution Date: 05/16/2019 Goal Status: Active Patient/caregiver will verbalize understanding of disease process and disease management Date Initiated: 04/14/2019 Target Resolution Date: 05/16/2019 Goal Status: Active Interventions: Assess peripheral edema status every visit. Compression as ordered Provide education on venous insufficiency Notes: Wound/Skin Impairment Nursing Diagnoses: Impaired tissue integrity Knowledge deficit related to ulceration/compromised skin integrity Goals: Patient/caregiver will verbalize understanding of skin care regimen Date Initiated: 04/14/2019 Target Resolution Date: 05/16/2019 Goal Status: Active Ulcer/skin breakdown will have a volume reduction of 30% by week 4 Date Initiated: 04/14/2019 Target Resolution Date: 05/16/2019 Goal Status: Active Interventions: Assess patient/caregiver ability to obtain necessary supplies Assess patient/caregiver ability to perform ulcer/skin care regimen upon admission and as needed Assess ulceration(s) every visit Provide education on ulcer and skin care Notes: Electronic Signature(s) Signed: 04/14/2019 6:39:44 PM By: [name] RN, BSN Entered By: [name] on 04/14/2019 18:22:06 -------------------------------------------------------------------------------- Pain Assessment Details Patient Name: Date of Service: Erin [name] 04/14/2019 1:15 PM Medical Record [id number] Patient Account Number: [id number] Date of Birth/Sex: Treating RN: [birth year] (86 y.o. Erin [name] Primary Care Jaynie Hitch: [name] Other Clinician: Referring Khylan Sawyer: Treating Areyanna Figeroa/Extender:Robson, [name], [name] Weeks in Treatment: 0 Active Problems Location of Pain Severity and Description of Pain Patient Has Paino Yes Site Locations With Dressing Change: Yes Duration of the Pain. Constant / Intermittento Intermittent How  Long Does it Lasto Hours: Minutes: 15 Rate the pain. Current Pain Level: 4 Worst Pain Level: 6 Least Pain Level: 0 Tolerable Pain Level: 5 Character of Pain Describe the Pain: Burning Pain Management and Medication Current Pain Management: Medication: Yes Cold Application: No Rest: Yes Massage: No Activity: No T.E.N.S.: No Heat Application: No Leg drop or elevation: No Is the Current Pain Management Adequate: Inadequate How does your wound impact your activities of daily livingo Sleep: Yes Bathing: No Appetite: No Relationship With Others: No Bladder Continence: No Emotions: No Bowel Continence: No Work: No Toileting: No Drive: No Dressing: No Hobbies: No Electronic Signature(s) Signed: 04/15/2019 5:23:18 PM By: [name] RN Entered By: [name] on 04/14/2019 14:09:08 -------------------------------------------------------------------------------- Patient/Caregiver Education Details Patient Name: Date of Service: Erin [name] 2/1/2021andnbsp1:15 PM Medical Record [id number] Patient Account Number: [id number] Date of Birth/Gender: Treating RN: 06-29-1930 (86 y.o. Erin [name] Primary Care Physician: [name] Other Clinician: Referring Physician: Treating Physician/Extender:Robson, [name], [name] in Treatment: 0 Education Assessment Education Provided To: Patient Education Topics Provided Safety: Methods: Explain/Verbal Responses: State content correctly Venous: Methods: Explain/Verbal Responses: State content correctly Wound/Skin Impairment: Methods: Explain/Verbal Responses: State content  correctly Electronic Signature(s) Signed: 04/14/2019 6:39:44 PM By: [name] RN, BSN Entered By: [name] on 04/14/2019 18:22:18 -------------------------------------------------------------------------------- Wound Assessment Details Patient Name: Date of Service: Erin [name] 04/14/2019 1:15 PM Medical Record [id number] Patient Account Number: [id number] Date of Birth/Sex: Treating RN: May 31, 1930 (86 y.o. F) Primary Care Jiovany Scheffel: [name] Other Clinician: Referring Sayre Mazor: Treating Keishla Oyer/Extender:Robson, [name], [name] Weeks in Treatment: 0 Wound Status Wound Number: 6 Primary Lymphedema Etiology: Wound Location: Right Lower Leg Wound Open Wounding Event: Gradually Appeared Status: Date Acquired: 03/14/2019 Comorbid Cataracts, Glaucoma, Middle ear problems, Weeks Of Treatment: 0 History: Anemia, Hypertension Clustered Wound: No Photos Wound Measurements Length: (cm) 9.5 % Reduct Width: (cm) 18 % Reduct Depth: (cm) 0.4 Epitheli Area: (cm) 134.303 Tunneli Volume: (cm) 53.721 Undermi Wound Description Classification: Full Thickness Without Exposed Support Foul Odo Structures Slough/F Wound Well defined, not attached Margin: Exudate Large Amount: Exudate Serosanguineous Type: Exudate red, brown Color: Wound Bed Granulation Amount: Small (1-33%) Granulation Quality: Red, Pink Fascia E Necrotic Amount: Large (67-100%) Fat Laye Necrotic Quality: Adherent Slough Tendon E Muscle E Joint Ex Bone Exp Electronic Signature(s) Signed: 04/21/2019 4:47:04 PM By: Erin [name] EMT/HBOT Previous Signature: 04/15/2019 5:23:18 PM Version By: [name] Entered ByMikeal [name] on 02/08 r After Cleansing: No ibrino Yes Exposed Structure xposed: No r (Subcutaneous Tissue) Exposed: Yes xposed: No xposed: No posed: No osed: No RN /2021 13:49:51 ion in Area: 0% ion in Volume: 0% alization: None ng: No ning:  No -------------------------------------------------------------------------------- Vitals Details Patient Name: Date of Service: MARIGRACE, MCCOLE 04/14/2019 1:15 PM Medical Record [id number] Patient Account Number: [id number] Date of Birth/Sex: Treating RN: 1930/08/31 (86 y.o. Erin [name] Primary Care Bellamarie Pflug: [name] Other Clinician: Referring Linden Mikes: Treating Erol Flanagin/Extender:Robson, [name], [name] Weeks in Treatment: 0 Vital Signs Time Taken: 13:41 Temperature (F): 98 Height (in): 60 Pulse (bpm): 79 Source: Stated Respiratory Rate (breaths/min): 18 Weight (lbs): 130 Blood Pressure (mmHg): 150/51 Source: Stated Reference Range: 80 - 120 mg / dl Body Mass Index (BMI): 25.4 Electronic Signature(s) Signed: 04/15/2019 5:23:18 PM By: [name] RN Entered By: [name] on 04/14/2019 13:42:35

## 2019-04-15 NOTE — Progress Notes (Signed)
Erin [name] ([id number]) Visit Report for 04/14/2019 Abuse/Suicide Risk Screen Details Patient Name: Date of Service: Erin [name], Erin [name] 04/14/2019 1:15 PM Medical Record [id number] Patient Account Number: [id number] Date of Birth/Sex: Treating RN: Jan 11, 1931 (86 y.o. Erin [name] Primary Care Taylon Coole: [name] Other Clinician: Referring Gloriana Piltz: Treating Cyrah Mclamb/Extender:Robson, [name], [name] Weeks in Treatment: 0 Abuse/Suicide Risk Screen Items Answer ABUSE RISK SCREEN: Has anyone close to you tried to hurt or harm you recentlyo No Do you feel uncomfortable with anyone in your familyo No Has anyone forced you do things that you didnt want to doo No Electronic Signature(s) Signed: 04/15/2019 5:23:18 PM By: [name] RN Entered By: [name] on 04/14/2019 13:45:42 -------------------------------------------------------------------------------- Activities of Daily Living Details Patient Name: Date of Service: Erin [name] 04/14/2019 1:15 PM Medical Record [id number] Patient Account Number: [id number] Date of Birth/Sex: Treating RN: 12-22-1930 (86 y.o. Erin [name] Primary Care Ewing Fandino: [name] Other Clinician: Referring Kien Mirsky: Treating Ioannis Schuh/Extender:Robson, [name], [name] Weeks in Treatment: 0 Activities of Daily Living Items Answer Activities of Daily Living (Please select one for each item) Drive Automobile Not Able Take Medications Completely Able Use Telephone Completely Able Care for Appearance Need Assistance Use Toilet Completely Able Bath / Shower Completely Able Dress Self Completely Able Feed Self Completely Able Walk Completely Able Get In / Out Bed Completely Able Housework Completely Able Prepare Meals Completely [place] Completely Able Shop for Self Completely Able Electronic Signature(s) Signed: 04/15/2019 5:23:18 PM By: [name] RN Entered By: [name] on  04/14/2019 13:50:11 -------------------------------------------------------------------------------- Education Screening Details Patient Name: Date of Service: Erin [name] 04/14/2019 1:15 PM Medical Record [id number] Patient Account Number: [id number] Date of Birth/Sex: Treating RN: 26-Oct-1930 (86 y.o. Erin [name] Primary Care Caraline Deutschman: [name] Other Clinician: Referring Khrystina Bonnes: Treating Bobie Caris/Extender:Robson, [name], [name] in Treatment: 0 Primary Learner Assessed: Patient Learning Preferences/Education Level/Primary Language Learning Preference: Explanation Highest Education Level: High School Preferred Language: English Cognitive Barrier Language Barrier: No Translator Needed: No Memory Deficit: No Emotional Barrier: No Cultural/Religious Beliefs Affecting Medical Care: No Physical Barrier Impaired Vision: No Impaired Hearing: No Decreased Hand dexterity: No Knowledge/Comprehension Knowledge Level: High Comprehension Level: High Ability to understand written High instructions: Ability to understand verbal High instructions: Motivation Anxiety Level: Calm Cooperation: Cooperative Education Importance: Acknowledges Need Interest in Health Problems: Asks Questions Perception: Coherent Willingness to Engage in Self- High Management Activities: Readiness to Engage in Self- High Management Activities: Electronic Signature(s) Signed: 04/15/2019 5:23:18 PM By: [name] RN Entered By: [name] on 04/14/2019 13:51:17 -------------------------------------------------------------------------------- Fall Risk Assessment Details Patient Name: Date of Service: Erin [name] 04/14/2019 1:15 PM Medical Record [id number] Patient Account Number: [id number] Date of Birth/Sex: Treating RN: [birth year] (86 y.o. Erin [name] Primary Care [name]: [name] Other Clinician: Referring Fran Neiswonger: Treating  Jovi Zavadil/Extender:Robson, [name], [name] in Treatment: 0 Fall Risk Assessment Items Have you had 2 or more falls in the last 12 monthso 0 No Have you had any fall that resulted in injury in the last 12 monthso 0 No FALLS RISK SCREEN History of falling - immediate or within 3 months 0 No Secondary diagnosis (Do you have 2 or more medical diagnoseso) 0 No Ambulatory aid None/bed rest/wheelchair/nurse 0 No Crutches/cane/walker 0 No Furniture 0 No Intravenous therapy Access/Saline/Heparin Lock 0 No Weak (short steps with or without shuffle, stooped but able to lift head 0 No while walking, may seek support from furniture) Impaired (short steps with shuffle, may have difficulty  arising from chair, 0 No head down, impaired balance) Mental Status Oriented to own ability 0 No Overestimates or forgets limitations 0 No Risk Level: Erin [name] Risk Score: 0 Electronic Signature(s) Signed: 04/15/2019 5:23:18 PM By: [name] RN Entered By: [name] on 04/14/2019 13:51:27 -------------------------------------------------------------------------------- Foot Assessment Details Patient Name: Date of Service: Erin [name], Erin [name] 04/14/2019 1:15 PM Medical Record [id number] Patient Account Number: [id number] Date of Birth/Sex: Treating RN: November 25, 1930 (86 y.o. Erin [name] Primary Care Kavish Lafitte: [name] Other Clinician: Referring Dathan Attia: Treating Michaila Kenney/Extender:Robson, [name], [name] Weeks in Treatment: 0 Foot Assessment Items Site Locations + = Sensation present, - = Sensation absent, C = Callus, U = Ulcer R = Redness, W = Warmth, M = Maceration, PU = Pre-ulcerative lesion F = Fissure, S = Swelling, D = Dryness Assessment Right: Left: Other Deformity: No No Prior Foot Ulcer: No No Prior Amputation: No No Charcot Joint: No No Ambulatory Status: Ambulatory Without Help Gait: Steady Electronic Signature(s) Signed: 04/15/2019 5:23:18 PM By: [name]  RN Entered By: [name] on 04/14/2019 14:04:00 -------------------------------------------------------------------------------- Nutrition Risk Screening Details Patient Name: Date of Service: Erin [name], Erin [name] 04/14/2019 1:15 PM Medical Record [id number] Patient Account Number: [id number] Date of Birth/Sex: Treating RN: 05-Aug-1930 (86 y.o. Erin [name] Primary Care Crystie Yanko: [name] Other Clinician: Referring Adenike Shidler: Treating Kamarrion Stfort/Extender:Robson, [name], [name] Weeks in Treatment: 0 Height (in): 60 Weight (lbs): 130 Body Mass Index (BMI): 25.4 Nutrition Risk Screening Items Score Screening NUTRITION RISK SCREEN: I have an illness or condition that made me change the kind and/or 0 No amount of food I eat I eat fewer than two meals per day 0 No I eat few fruits and vegetables, or milk products 0 No I have three or more drinks of beer, liquor or wine almost every day 0 No I have tooth or mouth problems that make it hard for me to eat 0 No I don't always have enough money to buy the food I need 0 No I eat alone most of the time 0 No I take three or more different prescribed or over-the-counter drugs a day 1 Yes 0 No Without wanting to, I have lost or gained 10 pounds in the last six months I am not always physically able to shop, cook and/or feed myself 2 Yes Nutrition Protocols Good Risk Protocol Provide education on Moderate Risk Protocol 0 nutrition High Risk Proctocol Risk Level: Moderate Risk Score: 3 Electronic Signature(s) Signed: 04/15/2019 5:23:18 PM By: [name] RN Entered By: [name] on 04/14/2019 14:03:36

## 2019-04-15 NOTE — Progress Notes (Addendum)
Erin [name] ([id number]) Visit Report for 04/14/2019 Debridement Details Patient Name: Date of Service: Erin [name], Erin [name] 04/14/2019 1:15 PM Medical Record [id number] Patient Account Number: [id number] Date of Birth/Sex: Treating RN: June 26, 1930 (86 y.o. F) Primary Care Provider: [name] Other Clinician: Referring Provider: Treating Provider/Extender:Skie Vitrano, [name], [name] Weeks in Treatment: 0 Debridement Performed for Wound #6 Right Lower Leg Assessment: Performed By: Physician Ricard Dillon., MD Debridement Type: Debridement Level of Consciousness (Pre- Awake and Alert procedure): Pre-procedure Yes - 14:35 Verification/Time Out Taken: Start Time: 14:35 Pain Control: Other : Benzocaine 20% Total Area Debrided (L x W): 9.5 (cm) x 18 (cm) = 171 (cm) Tissue and other material Viable, Non-Viable, Slough, Subcutaneous, Slough debrided: Level: Skin/Subcutaneous Tissue Debridement Description: Excisional Instrument: Curette Bleeding: Moderate Hemostasis Achieved: Pressure End Time: 14:39 Procedural Pain: 5 Post Procedural Pain: 3 Response to Treatment: Procedure was tolerated well Level of Consciousness Awake and Alert (Post-procedure): Post Debridement Measurements of Total Wound Length: (cm) 9.5 Width: (cm) 18 Depth: (cm) 0.4 Volume: (cm) 53.721 Character of Wound/Ulcer Post Requires Further Debridement Debridement: Post Procedure Diagnosis Same as Pre-procedure Electronic Signature(s) Signed: 04/14/2019 6:41:12 PM By: Linton Ham MD Entered By: Linton Ham on 04/14/2019 14:56:00 -------------------------------------------------------------------------------- HPI Details Patient Name: Date of Service: Erin [name] 04/14/2019 1:15 PM Medical Record [id number] Patient Account Number: [id number] Date of Birth/Sex: Treating RN: [birth year] (86 y.o. F) Primary Care Provider: [name] Other  Clinician: Referring Provider: Treating Provider/Extender:Kaci Freel, [name], [name] Weeks in Treatment: 0 History of Present Illness HPI Description: 03/22/17; this is an 86 year old woman. There is not a lot of information in care everywhere on this patient. She had a history of a malignant neoplasm her left breast for which she had lumpectomy but she did not have radiation. Apparently she has had long-standing edema and her lower legs and she was seen in the wound care center at [facility] in [place] by [name] October. Both her legs were wrapped however it sounds as though the left leg became secondarily infected she apparently had MRSA and she was admitted the hospital. Not really turn for wound care. She lives at home on her own and has apparently [facility] home health care. We are not exactly clear what [facility] is doing to her legs but it does not involve compression. She is not a diabetic. ABIs in our clinic were noncompressible bilaterally 03/30/17; this is a patient who has bilateral chronic venous insufficiency, severe venous inflammation. We had a nice description number legs from a note by [name] dating 2010 describing edematous warm legs with erythema. This suggests that she has chronic venous insufficiency with inflammation.. We have arterial studies dated 12/29/16 again from [facility]. This showed a right ABI at 1 and a TBI of 0.81 on the left the ABI was 1.25 and a TBI of 0.72. This was just she does not have significant arterial insufficiency. She had normal triphasic waveforms noted that she was admitted to hospital in late October with MSSA cellulitis Last visit we wrapped both her legs. She had an open area predominantly on the left anterior but weeping edema on the right anterior leg. She has done well there is only one at anterior left tibial wound 04/06/17; the patient has no open area on her right leg. Her remaining wound is on the left anterior  leg, the left posterior leg wound is healed. We have been using silver alginate changed to Hydrofera Blue on the left leg today she has home health  04/13/17; still no open area on her right leg. Her remaining wound on the left anterior leg. We have been using using Hydrofera Blue 04/20/17; no open area on the right leg although the edema here is somewhat disfiguring. Her remaining wound is on the left anterior leg we've been using Hydrofera Blue with improvement in dimensions. She has [facility] home health and lives in [place]. She requests to come every 2 weeks because of the distance involved in coming here 05/04/17; there is no open area on the right leg. She has a very small left anterior leg wound that remains. However what remains on the left still has some depth and a not to viable looking surface. It is too small to really attempt debridement. She has home health coming out to her home in [facility] 05/18/17; there is no open area on either leg. She has significant chronic venous insufficiency. The left anterior leg wound that was still open 2 weeks ago has closed. She has home health coming out. We are going to order Juxtalite stockings for both legs. She is agreed to pay for these privately. We will order them from [facility] READMISSION 04/14/2019 This is a patient we previously had in this clinic in 2019 for 3 months. She had a wound on the left anterior greater than right anterior leg. According to my notes we discharged her and juxta lite stockings although it does not appear that she ever got them. The history here is a bit difficult. She apparently has had a wound on the left lower leg for the last 6 months. She required admission to hospital in [facility] for apparent cellulitis just before Christmas was discharged home she has an open wound. She has a home health agency although we are not exactly sure which one. They are applying silver alginate. No compression Past medical history  is reviewed she has chronic venous insufficiency and a history of left breast CA. 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 She did not have any compression on the wounds today. Electronic Signature(s) Signed: 04/14/2019 6:41:12 PM By: Linton Ham MD Entered By: Linton Ham on 04/14/2019 14:58:27 -------------------------------------------------------------------------------- Physical Exam Details Patient Name: Date of Service: Erin [name] 04/14/2019 1:15 PM Medical Record [id number] Patient Account Number: [id number] Date of Birth/Sex: Treating RN: [birth year] (86 y.o. F) Primary Care Provider: [name] Other Clinician: Referring Provider: Treating Provider/Extender:Carmela Piechowski, [name], [name] Weeks in Treatment: 0 Constitutional Patient is hypertensive.. Pulse regular and within target range for patient.Marland Kitchen Respirations regular, non-labored and within target range.. Temperature is normal and within the target range for the patient.Marland Kitchen Appears in no distress. Respiratory work of breathing is normal. Cardiovascular Palpable on the right. Edema present in the right lower extremity. 2+ nonpitting. Integumentary (Hair, Skin) No major erythema around the wound. Notes Wound exam; wound is on the right anterior lower leg which extends medially to laterally and posteriorly. 100% covered in a very necrotic surface debris. Using an open curette and aggressive debridement. Hemostasis with direct pressure. Electronic Signature(s) Signed: 04/14/2019 6:41:12 PM By: Linton Ham MD Entered By: Linton Ham on 04/14/2019 14:59:45 -------------------------------------------------------------------------------- Physician Orders Details Patient Name: Date of Service: Erin [name] 04/14/2019 1:15 PM Medical Record [id number]  Patient Account Number: [id number] Date of Birth/Sex: Treating RN: 1930/08/17 (86 y.o. [name] Primary Care Provider: [name] Other Clinician: Referring  Provider: Treating Provider/Extender:Zareya Tuckett, [name], [name] Weeks in Treatment: 0 Verbal / Phone Orders: No Diagnosis Coding Follow-up Appointments Return Appointment in 1 week. Dressing Change Frequency Wound #6 Right Lower Leg Change dressing three times week. - wound clinic to change 1x/week on Monday's, home health to change 2x/week on Wednesday's and Friday's Skin Barriers/Peri-Wound Care Wound #6 Right Lower Leg Moisturizing lotion Wound Cleansing Wound #6 Right Lower Leg May shower with protection. - use cast protector Primary Wound Dressing Wound #6 Right Lower Leg Iodoflex - or Iodosorb gel/ointment if Iodoflex unavailable Secondary Dressing Wound #6 Right Lower Leg ABD pad Zetuvit or Kerramax - or Xtrasorb (or other superabsorbent dressing) Edema Control 3 Layer Compression System - Right Lower Extremity Avoid standing for long periods of time Elevate legs to the level of the heart or above for 30 minutes daily and/or when sitting, a frequency of: - throughout the day [place] skilled nursing for wound care. - [facility] Electronic Signature(s) Signed: 04/14/2019 6:39:44 PM By: [name] RN, BSN Signed: 04/14/2019 6:41:12 PM By: Linton Ham MD Entered By: [name] on 04/14/2019 14:45:08 -------------------------------------------------------------------------------- Problem List Details Patient Name: Date of Service: KARESHA, TRZCINSKI 04/14/2019 1:15 PM Medical Record [id number] Patient Account Number: [id number] Date of Birth/Sex: Treating RN: [birth year] (86 y.o. F) Primary Care Provider: [name] Other Clinician: Referring Provider: Treating Provider/Extender:Ibrahima Holberg, [name], [name] in Treatment: 0 Active  Problems ICD-10 Evaluated Encounter Code Description Active Date Today Diagnosis I87.331 Chronic venous hypertension (idiopathic) with ulcer 04/14/2019 No Yes and inflammation of right lower extremity L97.812 Non-pressure chronic ulcer of other part of right lower 04/14/2019 No Yes leg with fat layer exposed I89.0 Lymphedema, not elsewhere classified 04/14/2019 No Yes Inactive Problems Resolved Problems Electronic Signature(s) Signed: 04/14/2019 6:41:12 PM By: Linton Ham MD Entered By: Linton Ham on 04/14/2019 14:51:48 -------------------------------------------------------------------------------- Progress Note Details Patient Name: Date of Service: Erin [name] 04/14/2019 1:15 PM Medical Record [id number] Patient Account Number: [id number] Date of Birth/Sex: Treating RN: 02/17/1931 (86 y.o. F) Primary Care Provider: [name] Other Clinician: Referring Provider: Treating Provider/Extender:Arshdeep Bolger, [name], [name] Weeks in Treatment: 0 Subjective History of Present Illness (HPI) 03/22/17; this is an 86 year old woman. There is not a lot of information in care everywhere on this patient. She had a history of a malignant neoplasm her left breast for which she had lumpectomy but she did not have radiation. Apparently she has had long-standing edema and her lower legs and she was seen in the wound care center at [facility] in [place] by [name] October. Both her legs were wrapped however it sounds as though the left leg became secondarily infected she apparently had MRSA and she was admitted the hospital. Not really turn for wound care. She lives at home on her own and has apparently [facility] home health care. We are not exactly clear what [facility] is doing to her legs but it does not involve compression. She is not a diabetic. ABIs in our clinic were noncompressible bilaterally 03/30/17; this is a patient who has bilateral chronic venous insufficiency,  severe venous inflammation. We had a nice description number legs from a note by [name] dating 2010 describing edematous warm legs with erythema. This suggests that she has chronic venous insufficiency with inflammation.. We have arterial studies dated 12/29/16 again from [facility]. This showed a right ABI at 1 and a TBI of 0.81 on the left the ABI was 1.25 and a TBI of 0.72. This was just  she does not have significant arterial insufficiency. She had normal triphasic waveforms noted that she was admitted to hospital in late October with MSSA cellulitis Last visit we wrapped both her legs. She had an open area predominantly on the left anterior but weeping edema on the right anterior leg. She has done well there is only one at anterior left tibial wound 04/06/17; the patient has no open area on her right leg. Her remaining wound is on the left anterior leg, the left posterior leg wound is healed. We have been using silver alginate changed to [facility] on the left leg today she has home health 04/13/17; still no open area on her right leg. Her remaining wound on the left anterior leg. We have been using using Hydrofera Blue 04/20/17; no open area on the right leg although the edema here is somewhat disfiguring. Her remaining wound is on the left anterior leg we've been using Hydrofera Blue with improvement in dimensions. She has [facility] home health and lives in [place]. She requests to come every 2 weeks because of the distance involved in coming here 05/04/17; there is no open area on the right leg. She has a very small left anterior leg wound that remains. However what remains on the left still has some depth and a not to viable looking surface. It is too small to really attempt debridement. She has home health coming out to her home in [facility] 05/18/17; there is no open area on either leg. She has significant chronic venous insufficiency. The left anterior leg wound that  was still open 2 weeks ago has closed. She has home health coming out. We are going to order Juxtalite stockings for both legs. She is agreed to pay for these privately. We will order them from [facility] READMISSION 04/14/2019 This is a patient we previously had in this clinic in 2019 for 3 months. She had a wound on the left anterior greater than right anterior leg. According to my notes we discharged her and juxta lite stockings although it does not appear that she ever got them. The history here is a bit difficult. She apparently has had a wound on the left lower leg for the last 6 months. She required admission to hospital in [facility] for apparent cellulitis just before Christmas was discharged home she has an open wound. She has a home health agency although we are not exactly sure which one. They are applying silver alginate. No compression Past medical history is reviewed she has chronic venous insufficiency and a history of left breast CA. 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 She did not have any compression on the wounds today. Patient History Information obtained from Patient. Allergies No Known Allergies Family History Cancer - Mother, Diabetes - Mother, Heart Disease - Father, Hypertension - Father, Stroke - Mother, No family history of Hereditary Spherocytosis, Kidney Disease, Lung Disease, Seizures, Thyroid Problems, Tuberculosis. Social History Never smoker, Marital Status - Single, Alcohol Use - Never, Drug Use - No History, Caffeine Use - Daily. Medical History Eyes Patient has history of Cataracts - removed on left, tiny on right currently, Glaucoma Denies history of Optic Neuritis Ear/Nose/Mouth/Throat Patient has history of Middle ear problems Denies history of Chronic sinus problems/congestion Hematologic/Lymphatic Patient has history  of Anemia - hysterectomy in 1974--fine since Denies history of Hemophilia, Human  Immunodeficiency Virus, Lymphedema, Sickle Cell Disease Respiratory Denies history of Aspiration, Asthma, Chronic Obstructive Pulmonary Disease (COPD), Pneumothorax, Sleep Apnea, Tuberculosis Cardiovascular Patient has history of Hypertension - Lasix Denies history of Angina, Arrhythmia, Congestive Heart Failure, Coronary Artery Disease, Deep Vein Thrombosis, Hypotension, Myocardial Infarction, Peripheral Arterial Disease, Peripheral Venous Disease, Phlebitis, Vasculitis Gastrointestinal Denies history of Cirrhosis , Colitis, Crohnoos, Hepatitis A, Hepatitis B, Hepatitis C Endocrine Denies history of Type I Diabetes, Type II Diabetes Immunological Denies history of Lupus Erythematosus, Raynaudoos, Scleroderma Integumentary (Skin) Denies history of History of Burn Musculoskeletal Denies history of Gout, Rheumatoid Arthritis, Osteoarthritis, Osteomyelitis Neurologic Denies history of Dementia, Neuropathy, Quadriplegia, Paraplegia, Seizure Disorder Oncologic Denies history of Received Chemotherapy, Received Radiation Psychiatric Denies history of Anorexia/bulimia, Confinement Anxiety Hospitalization/Surgery History - Abdominal Hysterectomy. - Left Breast Lumpectomy with Radioactive Seed Localization. - Breast Surgery. - Lumbar Spinal Stenosis. - Left Re-Excision of Breast Cancer Superior Margins. Medical And Surgical History Notes Ear/Nose/Mouth/Throat congenital nerve deafness Genitourinary Cystitis Oncologic Malignant neoplasm of upper-inner quadrant of left breast in female, estrogen receptor positive Review of Systems (ROS) Constitutional Symptoms (General Health) Denies complaints or symptoms of Fatigue, Fever, Chills, Marked Weight Change. Eyes Denies complaints or symptoms of Dry Eyes, Vision Changes, Glasses / Contacts. Ear/Nose/Mouth/Throat Denies complaints or symptoms of Chronic sinus  problems or rhinitis. Respiratory Denies complaints or symptoms of Chronic or frequent coughs, Shortness of Breath. Cardiovascular Denies complaints or symptoms of Chest pain. Gastrointestinal Denies complaints or symptoms of Frequent diarrhea, Nausea, Vomiting. Endocrine Denies complaints or symptoms of Heat/cold intolerance. Genitourinary Denies complaints or symptoms of Frequent urination. Integumentary (Skin) Complains or has symptoms of Wounds. Musculoskeletal Denies complaints or symptoms of Muscle Pain, Muscle Weakness. Neurologic Denies complaints or symptoms of Numbness/parasthesias. Psychiatric Denies complaints or symptoms of Claustrophobia, Suicidal. Objective Constitutional Patient is hypertensive.. Pulse regular and within target range for patient.Marland Kitchen Respirations regular, non-labored and within target range.. Temperature is normal and within the target range for the patient.Marland Kitchen Appears in no distress. Vitals Time Taken: 1:41 PM, Height: 60 in, Source: Stated, Weight: 130 lbs, Source: Stated, BMI: 25.4, Temperature: 98 F, Pulse: 79 bpm, Respiratory Rate: 18 breaths/min, Blood Pressure: 150/51 mmHg. Respiratory work of breathing is normal. Cardiovascular Palpable on the right. Edema present in the right lower extremity. 2+ nonpitting. General Notes: Wound exam; wound is on the right anterior lower leg which extends medially to laterally and posteriorly. 100% covered in a very necrotic surface debris. Using an open curette and aggressive debridement. Hemostasis with direct pressure. Integumentary (Hair, Skin) No major erythema around the wound. Wound #6 status is Open. Original cause of wound was Gradually Appeared. The wound is located on the Right Lower Leg. The wound measures 9.5cm length x 18cm width x 0.4cm depth; 134.303cm^2 area and 53.721cm^3 volume. There is Fat Layer (Subcutaneous Tissue) Exposed exposed. There is no tunneling or undermining noted. There is a  large amount of serosanguineous drainage noted. The wound margin is well defined and not attached to the wound base. There is small (1-33%) red, pink granulation within the wound bed. There is a large (67-100%) amount of necrotic tissue within the wound bed including Adherent Slough. Assessment Active Problems ICD-10 Chronic venous hypertension (idiopathic) with ulcer and inflammation of right lower extremity Non-pressure chronic ulcer of other part of right lower leg with fat layer exposed Lymphedema, not elsewhere classified Procedures Wound #6 Pre-procedure diagnosis of Wound #6 is a Lymphedema located on the Right Lower Leg . There was a Excisional Skin/Subcutaneous Tissue Debridement with  a total area of 171 sq cm performed by Ricard Dillon., MD. With the following instrument(s): Curette to remove Viable and Non-Viable tissue/material. Material removed includes Subcutaneous Tissue and Slough and after achieving pain control using Other (Benzocaine 20%). No specimens were taken. A time out was conducted at 14:35, prior to the start of the procedure. A Moderate amount of bleeding was controlled with Pressure. The procedure was tolerated well with a pain level of 5 throughout and a pain level of 3 following the procedure. Post Debridement Measurements: 9.5cm length x 18cm width x 0.4cm depth; 53.721cm^3 volume. Character of Wound/Ulcer Post Debridement requires further debridement. Post procedure Diagnosis Wound #6: Same as Pre-Procedure Pre-procedure diagnosis of Wound #6 is a Lymphedema located on the Right Lower Leg . There was a Three Layer Compression Therapy Procedure by [name], RN. Post procedure Diagnosis Wound #6: Same as Pre-Procedure Plan Follow-up Appointments: Return Appointment in 1 week. Dressing Change Frequency: Wound #6 Right Lower Leg: Change dressing three times week. - wound clinic to change 1x/week on Monday's, home health to change 2x/week on  Wednesday's and Friday's Skin Barriers/Peri-Wound Care: Wound #6 Right Lower Leg: Moisturizing lotion Wound Cleansing: Wound #6 Right Lower Leg: May shower with protection. - use cast protector Primary Wound Dressing: Wound #6 Right Lower Leg: Iodoflex - or Iodosorb gel/ointment if Iodoflex unavailable Secondary Dressing: Wound #6 Right Lower Leg: ABD pad Zetuvit or Kerramax - or Xtrasorb (or other superabsorbent dressing) Edema Control: 3 Layer Compression System - Right Lower Extremity Avoid standing for long periods of time Elevate legs to the level of the heart or above for 30 minutes daily and/or when sitting, a frequency of: - throughout the day Home Health: [place] skilled nursing for wound care. - [facility] 1. We are going to use Iodoflex as the primary dressing to see if we can obtain some debridement 2. Home health changing this Wednesday and Friday would be nice before we see her next Monday 3. The patient does not have an obvious arterial issue here. 4. No gross soft tissue infection for now I did not think any antibiotics were currently necessary. 5. The patient did not have compression stockings that according to my notes we ordered last time. 6. Chronic venous insufficiency with secondary lymphedema. The edema is going to need to be controlled in order to give this a chance to heal Electronic Signature(s) Signed: 04/14/2019 6:41:12 PM By: Linton Ham MD Entered By: Linton Ham on 04/14/2019 15:00:56 -------------------------------------------------------------------------------- HxROS Details Patient Name: Date of Service: Erin [name] 04/14/2019 1:15 PM Medical Record [id number] Patient Account Number: [id number] Date of Birth/Sex: Treating RN: 04/10/1930 (86 y.o. [name] Primary Care Provider: [name] Other Clinician: Referring Provider: Treating Provider/Extender:Merve Hotard, [name], [name] in Treatment:  0 Information Obtained From Patient Constitutional Symptoms (General Health) Complaints and Symptoms: Negative for: Fatigue; Fever; Chills; Marked Weight Change Eyes Complaints and Symptoms: Negative for: Dry Eyes; Vision Changes; Glasses / Contacts Medical History: Positive for: Cataracts - removed on left, tiny on right currently; Glaucoma Negative for: Optic Neuritis Ear/Nose/Mouth/Throat Complaints and Symptoms: Negative for: Chronic sinus problems or rhinitis Medical History: Positive for: Middle ear problems Negative for: Chronic sinus problems/congestion Past Medical History Notes: congenital nerve deafness Respiratory Complaints and Symptoms: Negative for: Chronic or frequent coughs; Shortness of Breath Medical History: Negative for: Aspiration; Asthma; Chronic Obstructive Pulmonary Disease (COPD); Pneumothorax; Sleep Apnea; Tuberculosis Cardiovascular Complaints and Symptoms: Negative for: Chest pain Medical History: Positive for: Hypertension -  Lasix Negative for: Angina; Arrhythmia; Congestive Heart Failure; Coronary Artery Disease; Deep Vein Thrombosis; Hypotension; Myocardial Infarction; Peripheral Arterial Disease; Peripheral Venous Disease; Phlebitis; Vasculitis Gastrointestinal Complaints and Symptoms: Negative for: Frequent diarrhea; Nausea; Vomiting Medical History: Negative for: Cirrhosis ; Colitis; Crohns; Hepatitis A; Hepatitis B; Hepatitis C Endocrine Complaints and Symptoms: Negative for: Heat/cold intolerance Medical History: Negative for: Type I Diabetes; Type II Diabetes Genitourinary Complaints and Symptoms: Negative for: Frequent urination Medical History: Past Medical History Notes: Cystitis Integumentary (Skin) Complaints and Symptoms: Positive for: Wounds Medical History: Negative for: History of Burn Musculoskeletal Complaints and Symptoms: Negative for: Muscle Pain; Muscle Weakness Medical History: Negative for: Gout;  Rheumatoid Arthritis; Osteoarthritis; Osteomyelitis Neurologic Complaints and Symptoms: Negative for: Numbness/parasthesias Medical History: Negative for: Dementia; Neuropathy; Quadriplegia; Paraplegia; Seizure Disorder Psychiatric Complaints and Symptoms: Negative for: Claustrophobia; Suicidal Medical History: Negative for: Anorexia/bulimia; Confinement Anxiety Hematologic/Lymphatic Medical History: Positive for: Anemia - hysterectomy in 1974--fine since Negative for: Hemophilia; Human Immunodeficiency Virus; Lymphedema; Sickle Cell Disease Immunological Medical History: Negative for: Lupus Erythematosus; Raynauds; Scleroderma Oncologic Medical History: Negative for: Received Chemotherapy; Received Radiation Past Medical History Notes: Malignant neoplasm of upper-inner quadrant of left breast in female, estrogen receptor positive HBO Extended History Items Eyes: Eyes: Ear/Nose/Mouth/Throat: Cataracts Glaucoma Middle ear problems Immunizations Pneumococcal Vaccine: Received Pneumococcal Vaccination: No Implantable Devices No devices added Hospitalization / Surgery History Type of Hospitalization/Surgery Abdominal Hysterectomy Left Breast Lumpectomy with Radioactive Seed Localization Breast Surgery Lumbar Spinal Stenosis Left Re-Excision of Breast Cancer Superior Margins Family and Social History Cancer: Yes - Mother; Diabetes: Yes - Mother; Heart Disease: Yes - Father; Hereditary Spherocytosis: No; Hypertension: Yes - Father; Kidney Disease: No; Lung Disease: No; Seizures: No; Stroke: Yes - Mother; Thyroid Problems: No; Tuberculosis: No; Never smoker; Marital Status - Single; Alcohol Use: Never; Drug Use: No History; Caffeine Use: Daily; Financial Concerns: No; Food, Clothing or Shelter Needs: No; Support System Lacking: No; Transportation Concerns: No Electronic Signature(s) Signed: 04/14/2019 6:41:12 PM By: Linton Ham MD Signed: 04/15/2019 5:23:18 PM By: [name]  RN Entered By: [name] on 04/14/2019 13:45:27 -------------------------------------------------------------------------------- SuperBill Details Patient Name: Date of Service: Erin [name] 04/14/2019 Medical Record [id number] Patient Account Number: [id number] Date of Birth/Sex: Treating RN: June 07, 1930 (86 y.o. F) Primary Care Provider: [name] Other Clinician: Referring Provider: Treating Provider/Extender:Guliana Weyandt, [name], [name] Weeks in Treatment: 0 Diagnosis Coding ICD-10 Codes Code Description I87.331 Chronic venous hypertension (idiopathic) with ulcer and inflammation of right lower extremity L97.812 Non-pressure chronic ulcer of other part of right lower leg with fat layer exposed I89.0 Lymphedema, not elsewhere classified Facility Procedures CPT4: Code 38756433 992 Description: 13 - WOUND CARE VISIT-LEV 3 EST PT Modifier: 25 Quantity: 1 CPT4: 29518841 110 Description: 36 - DEB SUBQ TISSUE 20 SQ CM/< ICD-10 Diagnosis Description I87.331 Chronic venous hypertension (idiopathic) with ulcer and inf extremity L97.812 Non-pressure chronic ulcer of other part of right lower leg I89.0 Lymphedema, not elsewhere  classified Modifier: lammation of ri with fat layer Quantity: 1 ght lower exposed CPT4: 66063016 110 Description: 45 - DEB SUBQ TISS EA ADDL 20CM ICD-10 Diagnosis Description L97.812 Non-pressure chronic ulcer of other part of right lower leg Modifier: with fat layer Quantity: 8 exposed Physician Procedures CPT4: Code 0109323 55 Description: 213 - WC PHYS LEVEL 3 - EST PT ICD-10 Diagnosis Description I87.331 Chronic venous hypertension (idiopathic) with ulcer and infla extremity L97.812 Non-pressure chronic ulcer of other part of right lower leg w I89.0 Lymphedema, not elsewhere  classified Modifier: 25 mmation of rig ith  fat layer Quantity: 1 ht lower exposed CPT4: 7460029 Description: 84730 - WC PHYS SUBQ TISS 20 SQ CM ICD-10 Diagnosis  Description I87.331 Chronic venous hypertension (idiopathic) with ulcer and infla extremity L97.812 Non-pressure chronic ulcer of other part of right lower leg w I89.0 Lymphedema, not  elsewhere classified Modifier: mmation of rig ith fat layer Quantity: 1 ht lower exposed CPT4: 8569437 Description: 11045 - WC PHYS SUBQ TISS EA ADDL 20 CM ICD-10 Diagnosis Description C05.259 Non-pressure chronic ulcer of other part of right lower leg w Modifier: ith fat layer Quantity: 8 exposed Electronic Signature(s) Signed: 04/17/2019 5:50:02 PM By: Linton Ham MD Signed: 05/08/2019 2:25:48 PM By: [name] RN, BSN Previous Signature: 04/14/2019 6:39:44 PM Version By: [name] RN, BSN Previous Signature: 04/14/2019 6:41:12 PM Version By: Linton Ham MD Entered By: [name] on 04/15/2019 17:33:11

## 2019-04-21 ENCOUNTER — Other Ambulatory Visit: Payer: Self-pay

## 2019-04-21 ENCOUNTER — Encounter (HOSPITAL_BASED_OUTPATIENT_CLINIC_OR_DEPARTMENT_OTHER): Payer: Medicare HMO | Attending: Internal Medicine | Admitting: Internal Medicine

## 2019-04-21 DIAGNOSIS — L97812 Non-pressure chronic ulcer of other part of right lower leg with fat layer exposed: Secondary | ICD-10-CM | POA: Diagnosis not present

## 2019-04-22 NOTE — Progress Notes (Signed)
Erin, [name] ([id number]) Visit Report for 04/21/2019 Arrival Information Details Patient Name: Date of Service: Erin [name], Erin [name] 04/21/2019 2:30 PM Medical Record [id number] Patient Account Number: [id number] Date of Birth/Sex: Treating RN: 1931-03-09 (86 y.o. [name] Primary Care Future Yeldell: [name] Other Clinician: Referring Lynasia Meloche: Treating Garik Diamant/Extender:Robson, [name], [name] in Treatment: 1 Visit Information History Since Last Visit Added or deleted any medications: No Patient Arrived: Wheel Chair Any new allergies or adverse reactions: No Arrival Time: 14:44 Had a fall or experienced change in No Accompanied By: Daughter activities of daily living that may affect Transfer Assistance: Manual risk of falls: Patient Identification Verified: Yes Signs or symptoms of abuse/neglect since last No Secondary Verification Process Yes visito Completed: Hospitalized since last visit: No Patient Requires Transmission- No Implantable device outside of the clinic excluding No Based Precautions: cellular tissue based products placed in the center Patient Has Alerts: Yes since last visit: Patient Alerts: NON COMPRESSABLE Has Dressing in Place as Prescribed: Yes Has Compression in Place as Prescribed: Yes Pain Present Now: No Electronic Signature(s) Signed: 04/21/2019 5:38:07 PM By: Kela Millin Entered By: Kela Millin on 04/21/2019 14:44:39 -------------------------------------------------------------------------------- Compression Therapy Details Patient Name: Date of Service: Erin [name] 04/21/2019 2:30 PM Medical Record [id number] Patient Account Number: [id number] Date of Birth/Sex: Treating RN: 26-Feb-1931 (86 y.o. Erin [name] Primary Care Ilias Stcharles: [name] Other Clinician: Referring Lavon Horn: Treating Krishna Dancel/Extender:Robson, [name], [name] Weeks in Treatment: 1 Compression  Therapy Performed for Wound Wound #6 Right Lower Leg Assessment: Performed By: Clinician [name], RN Compression Type: Three Layer Post Procedure Diagnosis Same as Pre-procedure Electronic Signature(s) Signed: 04/22/2019 5:30:42 PM By: [name] RN, BSN Entered By: [name] on 04/21/2019 15:35:01 -------------------------------------------------------------------------------- Encounter Discharge Information Details Patient Name: Date of Service: Erin [name] 04/21/2019 2:30 PM Medical Record [id number] Patient Account Number: [id number] Date of Birth/Sex: Treating RN: 1930/03/20 (86 y.o. Erin [name] Primary Care Shaterra Sanzone: [name] Other Clinician: Referring Desarea Ohagan: Treating Wallis Spizzirri/Extender:Robson, [name], [name] in Treatment: 1 Encounter Discharge Information Items Post Procedure Vitals Discharge Condition: Stable Temperature (F): 98.4 Ambulatory Status: Wheelchair Pulse (bpm): 74 Discharge Destination: Home Respiratory Rate (breaths/min): 18 Transportation: Private Auto Blood Pressure (mmHg): 156/58 Accompanied By: caregiver Schedule Follow-up Appointment: Yes Clinical Summary of Care: Electronic Signature(s) Signed: 04/21/2019 5:47:02 PM By: Deon Pilling Entered By: Deon Pilling on 04/21/2019 16:20:04 -------------------------------------------------------------------------------- Lower Extremity Assessment Details Patient Name: Date of Service: [name], Erin 04/21/2019 2:30 PM Medical Record [id number] Patient Account Number: [id number] Date of Birth/Sex: Treating RN: June 07, 1930 (86 y.o. [name] Primary Care Dharma Pare: [name] Other Clinician: Referring Mahnoor Mathisen: Treating Kion Huntsberry/Extender:Robson, [name], [name] Weeks in Treatment: 1 Edema Assessment Assessed: [Left: No] [Right: No] E[Left: dema] [Right: :] Calf Left: Right: Point of Measurement: 35 cm From Medial Instep  cm 27.5 cm Ankle Left: Right: Point of Measurement: 7 cm From Medial Instep cm 23.6 cm Vascular Assessment Pulses: Dorsalis Pedis Palpable: [Right:Yes] Electronic Signature(s) Signed: 04/21/2019 5:38:07 PM By: Kela Millin Entered By: Kela Millin on 04/21/2019 14:45:25 -------------------------------------------------------------------------------- Multi Wound Chart Details Patient Name: Date of Service: Erin [name] 04/21/2019 2:30 PM Medical Record [id number] Patient Account Number: [id number] Date of Birth/Sex: Treating RN: 20-Dec-1930 (86 y.o. F) Primary Care Kimiye Strathman: [name] Other Clinician: Referring Tyerra Loretto: Treating Laterrian Hevener/Extender:Robson, [name], [name] Weeks in Treatment: 1 Vital Signs Height(in): 60 Pulse(bpm): 74 Weight(lbs): 130 Blood Pressure(mmHg): 156/58 Body Mass Index(BMI): 25 Temperature(F): 98.2 Respiratory 18 Rate(breaths/min): Photos: [6:No Photos] [7:No Photos] [N/A:N/A] Wound Location: [  6:Right Lower Leg] [7:Left, Medial Lower Leg] [N/A:N/A] Wounding Event: [6:Gradually Appeared] [7:Gradually Appeared] [N/A:N/A] Primary Etiology: [6:Lymphedema] [7:Lymphedema] [N/A:N/A] Comorbid History: [6:Cataracts, Glaucoma, Middle ear problems, Anemia, Hypertension] [7:Cataracts, Glaucoma, Middle ear problems, Anemia, Hypertension] [N/A:N/A] Date Acquired: [6:03/14/2019] [7:04/21/2019] [N/A:N/A] Weeks of Treatment: [6:1] [7:0] [N/A:N/A] Wound Status: [6:Open] [7:Open] [N/A:N/A] Measurements L x W x D 9x15.5x0.4 [7:0.3x0.3x0.1] [N/A:N/A] (cm) Area (cm) : [6:109.563] [7:0.071] [N/A:N/A] Volume (cm) : [6:43.825] [7:0.007] [N/A:N/A] % Reduction in Area: [6:18.40%] [7:0.00%] [N/A:N/A] % Reduction in Volume: 18.40% [7:0.00%] [N/A:N/A] Classification: [6:Full Thickness Without Exposed Support Structures Exposed Support Structures] [7:Full Thickness Without] [N/A:N/A] Exudate Amount: [6:Large] [7:Small] [N/A:N/A] Exudate Type:  [6:Purulent] [7:Purulent] [N/A:N/A] Exudate Color: [6:yellow, brown, green] [7:yellow, brown, green] [N/A:N/A] Wound Margin: [6:Well defined, not attached Distinct, outline attached N/A] Granulation Amount: [6:Medium (34-66%)] [7:Large (67-100%)] [N/A:N/A] Granulation Quality: [6:Red, Pink] [7:Pink] [N/A:N/A] Necrotic Amount: [6:Medium (34-66%)] [7:None Present (0%)] [N/A:N/A] Exposed Structures: [6:Fat Layer (Subcutaneous Fat Layer (Subcutaneous N/A Tissue) Exposed: Yes Fascia: No Tendon: No Muscle: No Joint: No Bone: No] [7:Tissue) Exposed: Yes Fascia: No Tendon: No Muscle: No Joint: No Bone: No] Epithelialization: [6:None] [7:None] [N/A:N/A] Debridement: [6:Debridement - Excisional N/A] [N/A:N/A] Pre-procedure [6:15:28] [7:N/A] [N/A:N/A] Verification/Time Out Taken: Pain Control: [6:Other] [7:N/A] [N/A:N/A] Tissue Debrided: [6:Subcutaneous, Slough] [7:N/A] [N/A:N/A] Level: [6:Skin/Subcutaneous Tissue] [7:N/A] [N/A:N/A] Debridement Area (sq cm):16 [7:N/A] [N/A:N/A] Instrument: [6:Curette] [7:N/A] [N/A:N/A] Bleeding: [6:Moderate] [7:N/A] [N/A:N/A] Hemostasis Achieved: [6:Pressure] [7:N/A] [N/A:N/A] Procedural Pain: [6:0] [7:N/A] [N/A:N/A] Post Procedural Pain: [6:0] [7:N/A] [N/A:N/A] Debridement Treatment Procedure was tolerated [7:N/A] [N/A:N/A] Response: [6:well] Post Debridement [6:9x15.5x0.4] [7:N/A] [N/A:N/A] Measurements L x W x D (cm) Post Debridement [6:43.825] [7:N/A] [N/A:N/A] Volume: (cm) Procedures Performed: Compression Therapy [6:Debridement] [7:N/A] [N/A:N/A] Treatment Notes Wound #6 (Right Lower Leg) 1. Cleanse With Wound Cleanser Soap and water 2. Periwound Care Barrier cream Moisturizing lotion 3. Primary Dressing Applied Iodoflex 4. Secondary Dressing ABD Pad Kerramax/Xtrasorb 6. Support Layer Applied 3 layer compression wrap Notes netting. Wound #7 (Left, Medial Lower Leg) 1. Cleanse With Wound Cleanser Soap and water 2. Periwound  Care Moisturizing lotion 3. Primary Dressing Applied Calcium Alginate 4. Secondary Dressing Foam Border Dressing 5. Secured With Office manager) Signed: 04/21/2019 6:01:37 PM By: Linton Ham MD Entered By: Linton Ham on 04/21/2019 16:41:09 -------------------------------------------------------------------------------- Multi-Disciplinary Care Plan Details Patient Name: Date of Service: Erin [name] 04/21/2019 2:30 PM Medical Record [id number] Patient Account Number: [id number] Date of Birth/Sex: Treating RN: 04/10/1930 (86 y.o. Erin [name] Primary Care Dmiyah Liscano: [name] Other Clinician: Referring Annalucia Laino: Treating Yolinda Duerr/Extender:Robson, [name], [name] in Treatment: 1 Active Inactive Abuse / Safety / Falls / Self Care Management Nursing Diagnoses: Potential for falls Potential for injury related to falls Goals: Patient will not experience any injury related to falls Date Initiated: 04/14/2019 Target Resolution Date: 05/16/2019 Goal Status: Active Patient/caregiver will verbalize/demonstrate measures taken to improve the patient's personal safety Date Initiated: 04/14/2019 Target Resolution Date: 05/16/2019 Goal Status: Active Patient/caregiver will verbalize/demonstrate measures taken to prevent injury and/or falls Date Initiated: 04/14/2019 Target Resolution Date: 05/16/2019 Goal Status: Active Interventions: Assess Activities of Daily Living upon admission and as needed Assess fall risk on admission and as needed Assess: immobility, friction, shearing, incontinence upon admission and as needed Assess impairment of mobility on admission and as needed per policy Assess personal safety and home safety (as indicated) on admission and as needed Assess self care needs on admission and as needed Provide education on fall prevention Provide education on personal and home safety Notes: Venous Leg  Ulcer Nursing  Diagnoses: Actual venous Insuffiency (use after diagnosis is confirmed) Knowledge deficit related to disease process and management Goals: Patient will maintain optimal edema control Date Initiated: 04/14/2019 Target Resolution Date: 05/16/2019 Goal Status: Active Patient/caregiver will verbalize understanding of disease process and disease management Date Initiated: 04/14/2019 Target Resolution Date: 05/16/2019 Goal Status: Active Interventions: Assess peripheral edema status every visit. Compression as ordered Provide education on venous insufficiency Notes: Wound/Skin Impairment Nursing Diagnoses: Impaired tissue integrity Knowledge deficit related to ulceration/compromised skin integrity Goals: Patient/caregiver will verbalize understanding of skin care regimen Date Initiated: 04/14/2019 Target Resolution Date: 05/16/2019 Goal Status: Active Ulcer/skin breakdown will have a volume reduction of 30% by week 4 Date Initiated: 04/14/2019 Target Resolution Date: 05/16/2019 Goal Status: Active Interventions: Assess patient/caregiver ability to obtain necessary supplies Assess patient/caregiver ability to perform ulcer/skin care regimen upon admission and as needed Assess ulceration(s) every visit Provide education on ulcer and skin care Notes: Electronic Signature(s) Signed: 04/22/2019 5:30:42 PM By: [name] RN, BSN Entered By: [name] on 04/21/2019 19:30:58 -------------------------------------------------------------------------------- Pain Assessment Details Patient Name: Date of Service: Erin [name] 04/21/2019 2:30 PM Medical Record [id number] Patient Account Number: [id number] Date of Birth/Sex: Treating RN: [birth year] (86 y.o. [name] Primary Care Charolett Yarrow: [name] Other Clinician: Referring Ardene Remley: Treating Tollie Canada/Extender:Robson, [name], [name] Weeks in Treatment: 1 Active Problems Location of Pain  Severity and Description of Pain Patient Has Paino No Site Locations Pain Management and Medication Current Pain Management: Electronic Signature(s) Signed: 04/21/2019 5:38:07 PM By: Kela Millin Entered By: Kela Millin on 04/21/2019 14:45:06 -------------------------------------------------------------------------------- Patient/Caregiver Education Details Patient Name: Date of Service: Erin [name] 2/8/2021andnbsp2:30 PM Medical Record [id number] Patient Account Number: [id number] Date of Birth/Gender: Treating RN: 1930/07/28 (86 y.o. Erin [name] Primary Care Physician: [name] Other Clinician: Referring Physician: Treating Physician/Extender:Robson, [name], [name] in Treatment: 1 Education Assessment Education Provided To: Patient Education Topics Provided Wound/Skin Impairment: Methods: Explain/Verbal Responses: State content correctly Electronic Signature(s) Signed: 04/22/2019 5:30:42 PM By: [name] RN, BSN Entered By: [name] on 04/21/2019 19:31:14 -------------------------------------------------------------------------------- Wound Assessment Details Patient Name: Date of Service: Erin [name] 04/21/2019 2:30 PM Medical Record [id number] Patient Account Number: [id number] Date of Birth/Sex: Treating RN: [birth year] (86 y.o. [name] Primary Care Chrisopher Pustejovsky: [name] Other Clinician: Referring Melysa Schroyer: Treating Jaeleah Smyser/Extender:Robson, [name], [name] Weeks in Treatment: 1 Wound Status Wound Number: 6 Primary Lymphedema Etiology: Wound Location: Right Lower Leg Wound Open Wounding Event: Gradually Appeared Status: Date Acquired: 03/14/2019 Comorbid Cataracts, Glaucoma, Middle ear problems, Weeks Of Treatment: 1 History: Anemia, Hypertension Clustered Wound: No Photos Wound Measurements Length: (cm) 9 % Reduct Width: (cm) 15.5 % Reduct Depth: (cm) 0.4  Epitheli Area: (cm) 109.563 Tunneli Volume: (cm) 43.825 Undermi Wound Description Full Thickness Without Exposed Support Foul Odo Classification: Structures Slough/F Wound Well defined, not attached Margin: Exudate Exudate Large Amount: Exudate Purulent Type: Exudate yellow, brown, green Color: Wound Bed Granulation Amount: Medium (34-66%) Granulation Quality: Red, Pink Fascia E Necrotic Amount: Medium (34-66%) Fat Laye Necrotic Quality: Adherent Slough Tendon E Muscle E Joint Ex Bone Exp r After Cleansing: No ibrino Yes Exposed Structure xposed: No r (Subcutaneous Tissue) Exposed: Yes xposed: No xposed: No posed: No osed: No ion in Area: 18.4% ion in Volume: 18.4% alization: None ng: No ning: No Treatment Notes Wound #6 (Right Lower Leg) 1. Cleanse With Wound Cleanser Soap and water 2. Periwound Care Barrier cream Moisturizing lotion 3. Primary Dressing Applied Iodoflex 4. Secondary Dressing ABD Pad Kerramax/Xtrasorb 6. Support  Layer Applied 3 layer compression wrap Notes netting. Electronic Signature(s) Signed: 04/22/2019 4:28:10 PM By: Mikeal Hawthorne EMT/HBOT Signed: 04/22/2019 5:16:17 PM By: Kela Millin Previous Signature: 04/21/2019 5:38:07 PM Version By: Kela Millin Entered By: Mikeal Hawthorne on 04/22/2019 11:50:46 -------------------------------------------------------------------------------- Wound Assessment Details Patient Name: Date of Service: Erin [name] 04/21/2019 2:30 PM Medical Record [id number] Patient Account Number: [id number] Date of Birth/Sex: Treating RN: 11/11/1930 (86 y.o. [name] Primary Care Abyan Cadman: [name] Other Clinician: Referring Jackolyn Geron: Treating Shadae Reino/Extender:Robson, [name], [name] Weeks in Treatment: 1 Wound Status Wound Number: 7 Primary Lymphedema Etiology: Wound Location: Left Lower Leg - Medial Wound Open Wounding Event: Gradually  Appeared Status: Date Acquired: 04/21/2019 Comorbid Cataracts, Glaucoma, Middle ear problems, Weeks Of Treatment: 0 History: Anemia, Hypertension Clustered Wound: No Photos Wound Measurements Length: (cm) 0.3 % Reduct Width: (cm) 0.3 % Reduct Depth: (cm) 0.1 Epitheli Area: (cm) 0.071 Tunneli Volume: (cm) 0.007 Wound Description Classification: Full Thickness Without Exposed Support Foul Odo Structures Slough/F Wound Distinct, outline attached Margin: Exudate Small Amount: Exudate Purulent Type: Exudate yellow, brown, green Color: Wound Bed Granulation Amount: Large (67-100%) Granulation Quality: Pink Fascia E Necrotic Amount: None Present (0%) Fat Laye Tendon E Muscle E Joint Ex Bone Exp r After Cleansing: No ibrino No Exposed Structure xposed: No r (Subcutaneous Tissue) Exposed: Yes xposed: No xposed: No posed: No osed: No ion in Area: 0% ion in Volume: 0% alization: None ng: No Treatment Notes Wound #7 (Left, Medial Lower Leg) 1. Cleanse With Wound Cleanser Soap and water 2. Periwound Care Moisturizing lotion 3. Primary Dressing Applied Calcium Alginate 4. Secondary Dressing Foam Border Dressing 5. Secured With Office manager) Signed: 04/22/2019 4:28:10 PM By: Mikeal Hawthorne EMT/HBOT Signed: 04/22/2019 5:16:17 PM By: Kela Millin Previous Signature: 04/21/2019 5:38:07 PM Version By: Kela Millin Entered By: Mikeal Hawthorne on 04/22/2019 12:47:49 -------------------------------------------------------------------------------- Vitals Details Patient Name: Date of Service: Venia Carbon A. 04/21/2019 2:30 PM Medical Record [id number] Patient Account Number: [id number] Date of Birth/Sex: Treating RN: 11/23/1930 (86 y.o. [name] Primary Care Ericka Marcellus: [name] Other Clinician: Referring Keerat Denicola: Treating Azael Ragain/Extender:Robson, [name], [name] Weeks in Treatment: 1 Vital  Signs Time Taken: 14:44 Temperature (F): 98.2 Height (in): 60 Pulse (bpm): 74 Weight (lbs): 130 Respiratory Rate (breaths/min): 18 Body Mass Index (BMI): 25.4 Blood Pressure (mmHg): 156/58 Reference Range: 80 - 120 mg / dl Electronic Signature(s) Signed: 04/21/2019 5:38:07 PM By: Kela Millin Entered By: Kela Millin on 04/21/2019 14:44:59

## 2019-04-22 NOTE — Progress Notes (Signed)
Erin [name], Erin [name] ([id number]) Visit Report for 04/21/2019 Debridement Details Patient Name: Date of Service: Erin [name], Erin [name] 04/21/2019 2:30 PM Medical Record [id number] Patient Account Number: [id number] Date of Birth/Sex: Treating RN: 11/25/1930 (86 y.o. F) Primary Care Provider: [name] Other Clinician: Referring Provider: Treating Provider/Extender:Esabella Stockinger, [name], [name] Weeks in Treatment: 1 Debridement Performed for Wound #6 Right Lower Leg Assessment: Performed By: Physician Ricard Dillon., MD Debridement Type: Debridement Level of Consciousness (Pre- Awake and Alert procedure): Pre-procedure Yes - 15:28 Verification/Time Out Taken: Start Time: 15:28 Pain Control: Other : Benzocaine 20% Total Area Debrided (L x W): 4 (cm) x 4 (cm) = 16 (cm) Tissue and other material Viable, Non-Viable, Slough, Subcutaneous, Slough debrided: Level: Skin/Subcutaneous Tissue Debridement Description: Excisional Instrument: Curette Bleeding: Moderate Hemostasis Achieved: Pressure End Time: 15:30 Procedural Pain: 0 Post Procedural Pain: 0 Response to Treatment: Procedure was tolerated well Level of Consciousness Awake and Alert (Post-procedure): Post Debridement Measurements of Total Wound Length: (cm) 9 Width: (cm) 15.5 Depth: (cm) 0.4 Volume: (cm) 43.825 Character of Wound/Ulcer Post Requires Further Debridement Debridement: Post Procedure Diagnosis Same as Pre-procedure Electronic Signature(s) Signed: 04/21/2019 6:01:37 PM By: Linton Ham MD Entered By: Linton Ham on 04/21/2019 16:41:18 -------------------------------------------------------------------------------- HPI Details Patient Name: Date of Service: Erin [name] 04/21/2019 2:30 PM Medical Record [id number] Patient Account Number: [id number] Date of Birth/Sex: Treating RN: 01-20-1931 (86 y.o. F) Primary Care Provider: [name] Other Clinician: Referring  Provider: Treating Provider/Extender:Rigby Leonhardt, [name], [name] Weeks in Treatment: 1 History of Present Illness HPI Description: We to find a new wound on the left 03/22/17; this is an 86 year old woman. There is not a lot of information in care everywhere on this patient. She had a history of a malignant neoplasm her left breast for which she had lumpectomy but she did not have radiation. Apparently she has had long-standing edema and her lower legs and she was seen in the wound care center at [facility] in [place] by [name] October. Both her legs were wrapped however it sounds as though the left leg became secondarily infected she apparently had MRSA and she was admitted the hospital. Not really turn for wound care. She lives at home on her own and has apparently [facility] home health care. We are not exactly clear what [facility] is doing to her legs but it does not involve compression. She is not a diabetic. ABIs in our clinic were noncompressible bilaterally 03/30/17; this is a patient who has bilateral chronic venous insufficiency, severe venous inflammation. We had a nice description number legs from a note by [name] dating 2010 describing edematous warm legs with erythema. This suggests that she has chronic venous insufficiency with inflammation.. We have arterial studies dated 12/29/16 again from [facility]. This showed a right ABI at 1 and a TBI of 0.81 on the left the ABI was 1.25 and a TBI of 0.72. This was just she does not have significant arterial insufficiency. She had normal triphasic waveforms noted that she was admitted to hospital in late October with MSSA cellulitis Last visit we wrapped both her legs. She had an open area predominantly on the left anterior but weeping edema on the right anterior leg. She has done well there is only one at anterior left tibial wound 04/06/17; the patient has no open area on her right leg. Her remaining wound is on the  left anterior leg, the left posterior leg wound is healed. We have been using silver alginate changed to [facility]  on the left leg today she has home health 04/13/17; still no open area on her right leg. Her remaining wound on the left anterior leg. We have been using using Hydrofera Blue 04/20/17; no open area on the right leg although the edema here is somewhat disfiguring. Her remaining wound is on the left anterior leg we've been using Hydrofera Blue with improvement in dimensions. She has [facility] home health and lives in [place]. She requests to come every 2 weeks because of the distance involved in coming here 05/04/17; there is no open area on the right leg. She has a very small left anterior leg wound that remains. However what remains on the left still has some depth and a not to viable looking surface. It is too small to really attempt debridement. She has home health coming out to her home in [facility] 05/18/17; there is no open area on either leg. She has significant chronic venous insufficiency. The left anterior leg wound that was still open 2 weeks ago has closed. She has home health coming out. We are going to order Juxtalite stockings for both legs. She is agreed to pay for these privately. We will order them from [facility] READMISSION 04/14/2019 This is a patient we previously had in this clinic in 2019 for 3 months. She had a wound on the left anterior greater than right anterior leg. According to my notes we discharged her and juxta lite stockings although it does not appear that she ever got them. The history here is a bit difficult. She apparently has had a wound on the left lower leg for the last 6 months. She required admission to hospital in [facility] for apparent cellulitis just before Christmas was discharged home she has an open wound. She has a home health agency although we are not exactly sure which one. They are applying silver alginate. No compression Past  medical history is reviewed she has chronic venous insufficiency and a history of left breast CA. 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 She did not have any compression on the wounds today. 2/8; substantial wound on the right posterior calf and a smaller area on the left medial calf. We are using Iodoflex to both wound areas under compression. We have better edema control We to find a new wound on the left anterior medial calf Electronic Signature(s) Signed: 04/21/2019 6:01:37 PM By: Linton Ham MD Entered By: Linton Ham on 04/21/2019 16:45:24 -------------------------------------------------------------------------------- Physical Exam Details Patient Name: Date of Service: Erin [name] 04/21/2019 2:30 PM Medical Record [id number] Patient Account Number: [id number] Date of Birth/Sex: Treating RN: October 04, 1930 (86 y.o. F) Primary Care Provider: [name] Other Clinician: Referring Provider: Treating Provider/Extender:Momodou Consiglio, [name], [name] Weeks in Treatment: 1 Constitutional Patient is hypertensive.. Pulse regular and within target range for patient.Marland Kitchen Respirations regular, non-labored and within target range.. Temperature is normal and within the target range for the patient.Marland Kitchen Appears in no distress. Notes Wound exam; wounds on the right lower leg extensive wounds. 100% of this is slough covered. I used a open curette and debrided roughly 20% of this. I think the Iodoflex is helping as this was a much easier debridement on last week She has a new open area on the left anterior medially. Electronic Signature(s) Signed: 04/21/2019 6:01:37 PM By: Linton Ham MD Entered By: Linton Ham on 04/21/2019 16:43:47 --------------------------------------------------------------------------------  Physician Orders  Details Patient Name: Date of Service: Erin [name], Erin [name] 04/21/2019 2:30 PM Medical Record [id number] Patient Account Number: [id number] Date of Birth/Sex: Treating RN: [birth year] (86 y.o. [name] Primary Care Provider: Other Clinician: [name] Referring Provider: Treating Provider/Extender:Alexiah Koroma, [name], [name] in Treatment: 1 Verbal / Phone Orders: No Diagnosis Coding ICD-10 Coding Code Description I87.331 Chronic venous hypertension (idiopathic) with ulcer and inflammation of right lower extremity L97.812 Non-pressure chronic ulcer of other part of right lower leg with fat layer exposed I89.0 Lymphedema, not elsewhere classified Follow-up Appointments Return Appointment in 1 week. Dressing Change Frequency Wound #6 Right Lower Leg Change dressing three times week. - wound clinic to change 1x/week on Monday's, home health to change 2x/week on Wednesday's and Friday's Wound #7 Left,Medial Lower Leg Change dressing three times week. - wound clinic to change 1x/week on Monday's, home health to change 2x/week on Wednesday's and Friday's Skin Barriers/Peri-Wound Care Barrier cream Moisturizing lotion Wound Cleansing May shower with protection. - use cast protector Primary Wound Dressing Wound #6 Right Lower Leg Iodoflex - or Iodosorb gel/ointment if Iodoflex unavailable Wound #7 Left,Medial Lower Leg Calcium Alginate Secondary Dressing Wound #6 Right Lower Leg ABD pad Zetuvit or Kerramax - or Xtrasorb (or other superabsorbent dressing) Wound #7 Left,Medial Lower Leg Foam Border - or large bandaid Edema Control 3 Layer Compression System - Right Lower Extremity Avoid standing for long periods of time Elevate legs to the level of the heart or above for 30 minutes daily and/or when sitting, a frequency of: - throughout the day [place] skilled nursing for wound care. - [facility] Electronic Signature(s) Signed:  04/21/2019 6:01:37 PM By: Linton Ham MD Signed: 04/22/2019 5:30:42 PM By: [name] RN, BSN Entered By: [name] on 04/21/2019 15:33:38 -------------------------------------------------------------------------------- Problem List Details Patient Name: Date of Service: Erin [name] 04/21/2019 2:30 PM Medical Record [id number] Patient Account Number: [id number] Date of Birth/Sex: Treating RN: 04-11-1930 (86 y.o. [name] Primary Care Provider: [name] Other Clinician: Referring Provider: Treating Provider/Extender:Koren Plyler, [name], [name] in Treatment: 1 Active Problems ICD-10 Evaluated Encounter Code Description Active Date Today Diagnosis I87.331 Chronic venous hypertension (idiopathic) with ulcer 04/14/2019 No Yes and inflammation of right lower extremity L97.812 Non-pressure chronic ulcer of other part of right lower 04/14/2019 No Yes leg with fat layer exposed I89.0 Lymphedema, not elsewhere classified 04/14/2019 No Yes L97.821 Non-pressure chronic ulcer of other part of left lower 04/21/2019 No Yes leg limited to breakdown of skin Inactive Problems Resolved Problems Electronic Signature(s) Signed: 04/21/2019 6:01:37 PM By: Linton Ham MD Entered By: Linton Ham on 04/21/2019 16:44:39 -------------------------------------------------------------------------------- Progress Note Details Patient Name: Date of Service: Erin [name] 04/21/2019 2:30 PM Medical Record [id number] Patient Account Number: [id number] Date of Birth/Sex: Treating RN: 1930-08-04 (86 y.o. F) Primary Care Provider: [name] Other Clinician: Referring Provider: Treating Provider/Extender:Vallory Oetken, [name], [name] Weeks in Treatment: 1 Subjective History of Present Illness (HPI) We to find a new wound on the left 03/22/17; this is an 86 year old woman. There is not a lot of information in care everywhere on this patient. She had a  history of a malignant neoplasm her left breast for which she had lumpectomy but she did not have radiation. Apparently she has had long-standing edema and her lower legs and she was seen in the wound care center at [facility] in [place] by [name] October. Both her legs were wrapped however it sounds as though the left leg  became secondarily infected she apparently had MRSA and she was admitted the hospital. Not really turn for wound care. She lives at home on her own and has apparently [facility] home health care. We are not exactly clear what [facility] is doing to her legs but it does not involve compression. She is not a diabetic. ABIs in our clinic were noncompressible bilaterally 03/30/17; this is a patient who has bilateral chronic venous insufficiency, severe venous inflammation. We had a nice description number legs from a note by [name] dating 2010 describing edematous warm legs with erythema. This suggests that she has chronic venous insufficiency with inflammation.. We have arterial studies dated 12/29/16 again from [facility]. This showed a right ABI at 1 and a TBI of 0.81 on the left the ABI was 1.25 and a TBI of 0.72. This was just she does not have significant arterial insufficiency. She had normal triphasic waveforms noted that she was admitted to hospital in late October with MSSA cellulitis Last visit we wrapped both her legs. She had an open area predominantly on the left anterior but weeping edema on the right anterior leg. She has done well there is only one at anterior left tibial wound 04/06/17; the patient has no open area on her right leg. Her remaining wound is on the left anterior leg, the left posterior leg wound is healed. We have been using silver alginate changed to [facility] on the left leg today she has home health 04/13/17; still no open area on her right leg. Her remaining wound on the left anterior leg. We have been using using Hydrofera  Blue 04/20/17; no open area on the right leg although the edema here is somewhat disfiguring. Her remaining wound is on the left anterior leg we've been using Hydrofera Blue with improvement in dimensions. She has [facility] home health and lives in [place]. She requests to come every 2 weeks because of the distance involved in coming here 05/04/17; there is no open area on the right leg. She has a very small left anterior leg wound that remains. However what remains on the left still has some depth and a not to viable looking surface. It is too small to really attempt debridement. She has home health coming out to her home in [facility] 05/18/17; there is no open area on either leg. She has significant chronic venous insufficiency. The left anterior leg wound that was still open 2 weeks ago has closed. She has home health coming out. We are going to order Juxtalite stockings for both legs. She is agreed to pay for these privately. We will order them from [facility] READMISSION 04/14/2019 This is a patient we previously had in this clinic in 2019 for 3 months. She had a wound on the left anterior greater than right anterior leg. According to my notes we discharged her and juxta lite stockings although it does not appear that she ever got them. The history here is a bit difficult. She apparently has had a wound on the left lower leg for the last 6 months. She required admission to hospital in [facility] for apparent cellulitis just before Christmas was discharged home she has an open wound. She has a home health agency although we are not exactly sure which one. They are applying silver alginate. No compression Past medical history is reviewed she has chronic venous insufficiency and a history of left breast CA. She also has a history of MRSA in the wound ABIs were not  done in the clinic today however she did have noninvasive arterial studies in 2018 which are actually quite good. She did not have any  compression on the wounds today. 2/8; substantial wound on the right posterior calf and a smaller area on the left medial calf. We are using Iodoflex to both wound areas under compression. We have better edema control We to find a new wound on the left anterior medial calf Objective Constitutional Patient is hypertensive.. Pulse regular and within target range for patient.Marland Kitchen Respirations regular, non-labored and within target range.. Temperature is normal and within the target range for the patient.Marland Kitchen Appears in no distress. Vitals Time Taken: 2:44 PM, Height: 60 in, Weight: 130 lbs, BMI: 25.4, Temperature: 98.2 F, Pulse: 74 bpm, Respiratory Rate: 18 breaths/min, Blood Pressure: 156/58 mmHg. General Notes: Wound exam; wounds on the right lower leg extensive wounds. 100% of this is slough covered. I used a open curette and debrided roughly 20% of this. I think the Iodoflex is helping as this was a much easier debridement on last week ooShe has a new open area on the left anterior medially. Integumentary (Hair, Skin) Wound #6 status is Open. Original cause of wound was Gradually Appeared. The wound is located on the Right Lower Leg. The wound measures 9cm length x 15.5cm width x 0.4cm depth; 109.563cm^2 area and 43.825cm^3 volume. There is Fat Layer (Subcutaneous Tissue) Exposed exposed. There is no tunneling or undermining noted. There is a large amount of purulent drainage noted. The wound margin is well defined and not attached to the wound base. There is medium (34-66%) red, pink granulation within the wound bed. There is a medium (34-66%) amount of necrotic tissue within the wound bed including Adherent Slough. Wound #7 status is Open. Original cause of wound was Gradually Appeared. The wound is located on the Left,Medial Lower Leg. The wound measures 0.3cm length x 0.3cm width x 0.1cm depth; 0.071cm^2 area and 0.007cm^3 volume. There is Fat Layer (Subcutaneous Tissue) Exposed exposed.  There is no tunneling noted. There is a small amount of purulent drainage noted. The wound margin is distinct with the outline attached to the wound base. There is large (67-100%) pink granulation within the wound bed. There is no necrotic tissue within the wound bed. 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 There was a Excisional Skin/Subcutaneous Tissue Debridement with a total area of 16 sq cm performed by Ricard Dillon., MD. With the following instrument(s): Curette to remove Viable and Non-Viable tissue/material. Material removed includes Subcutaneous Tissue and Slough and after achieving pain control using Other (Benzocaine 20%). No specimens were taken. A time out was conducted at 15:28, prior to the start of the procedure. A Moderate amount of bleeding was controlled with Pressure. The procedure was tolerated well with a pain level of 0 throughout and a pain level of 0 following the procedure. Post Debridement Measurements: 9cm length x 15.5cm width x 0.4cm depth; 43.825cm^3 volume. Character of Wound/Ulcer Post Debridement requires further debridement. Post procedure Diagnosis Wound #6: Same as Pre-Procedure Pre-procedure diagnosis of Wound #6 is a Lymphedema located on the Right Lower Leg . There was a Three Layer  Compression Therapy Procedure by [name], RN. Post procedure Diagnosis Wound #6: Same as Pre-Procedure Plan Follow-up Appointments: Return Appointment in 1 week. Dressing Change Frequency: Wound #6 Right Lower Leg: Change dressing three times week. - wound clinic  to change 1x/week on Monday's, home health to change 2x/week on Wednesday's and Friday's Wound #7 Left,Medial Lower Leg: Change dressing three times week. - wound clinic to change 1x/week on Monday's, home health to change 2x/week on Wednesday's and Friday's Skin Barriers/Peri-Wound Care: Barrier cream Moisturizing lotion Wound Cleansing: May shower with protection. - use cast protector Primary Wound Dressing: Wound #6 Right Lower Leg: Iodoflex - or Iodosorb gel/ointment if Iodoflex unavailable Wound #7 Left,Medial Lower Leg: Calcium Alginate Secondary Dressing: Wound #6 Right Lower Leg: ABD pad Zetuvit or Kerramax - or Xtrasorb (or other superabsorbent dressing) Wound #7 Left,Medial Lower Leg: Foam Border - or large bandaid Edema Control: 3 Layer Compression System - Right Lower Extremity Avoid standing for long periods of time Elevate legs to the level of the heart or above for 30 minutes daily and/or when sitting, a frequency of: - throughout the day Home Health: [place] skilled [facility] for wound care. - [facility] 1. I think we are making progress with Iodoflex and on the extensive right leg wound we are going to continue with this. She may require a more aggressive debridement and I did today however wait until this loosens up a bit 2. She has a new wound on the left anterior medial leg. We put calcium alginate and a border foam on this orders in the home health Electronic Signature(s) Signed: 04/21/2019 6:01:37 PM By: Linton Ham MD Entered By: Linton Ham on 04/21/2019 16:46:05 -------------------------------------------------------------------------------- SuperBill Details Patient Name: Date of Service: Erin [name] 04/21/2019 Medical Record [id number] Patient Account Number: [id number] Date of Birth/Sex: Treating RN: [birth year] (86 y.o. F) Primary Care Provider: [name] Other Clinician: Referring Provider: Treating  Provider/Extender:Tore Carreker, [name], [name] Weeks in Treatment: 1 Diagnosis Coding ICD-10 Codes Code Description I87.331 Chronic venous hypertension (idiopathic) with ulcer and inflammation of right lower extremity L97.812 Non-pressure chronic ulcer of other part of right lower leg with fat layer exposed I89.0 Lymphedema, not elsewhere classified L97.821 Non-pressure chronic ulcer of other part of left lower leg limited to breakdown of skin Facility Procedures CPT4 Code Description: [id number] 11042 - DEB SUBQ TISSUE 20 SQ CM/< ICD-10 Diagnosis Description T03.546 Non-pressure chronic ulcer of other part of right lower leg I89.0 Lymphedema, not elsewhere classified Modifier: with fat layer Quantity: 1 exposed Physician Procedures CPT4 Code Description: [id number] 11042 - WC PHYS SUBQ TISS 20 SQ CM ICD-10 Diagnosis Description T70.017 Non-pressure chronic ulcer of other part of right lower leg I89.0 Lymphedema, not elsewhere classified Modifier: with fat layer Quantity: 1 exposed Electronic Signature(s) Signed: 04/21/2019 6:01:37 PM By: Linton Ham MD Entered By: Linton Ham on 04/21/2019 16:46:22

## 2019-04-28 ENCOUNTER — Encounter (HOSPITAL_BASED_OUTPATIENT_CLINIC_OR_DEPARTMENT_OTHER): Payer: Medicare HMO | Admitting: Internal Medicine

## 2019-04-28 ENCOUNTER — Other Ambulatory Visit: Payer: Self-pay

## 2019-04-28 DIAGNOSIS — L97812 Non-pressure chronic ulcer of other part of right lower leg with fat layer exposed: Secondary | ICD-10-CM | POA: Diagnosis not present

## 2019-04-28 NOTE — Progress Notes (Signed)
EMOJEAN, GERTZ ([id number]) Visit Report for 04/28/2019 Debridement Details Patient Name: [name], [name] Date of Service: 04/28/2019 3:30 PM Medical Record [id number] Patient Account Number: [id number] Date of Birth/Sex: Treating RN: 13-Oct-1930 (86 y.o. Erin [name] Primary Care Provider: Arsenio [name] Other Clinician: Referring Provider: Treating Provider/Extender:Erin [name], Erin [name], Erin [name] in Treatment: 2 Debridement Performed for Wound #6 Right Lower Leg Assessment: Performed By: Physician Erin [name]., MD Debridement Type: Debridement Level of Consciousness (Pre- Awake and Alert procedure): Pre-procedure Yes - 17:28 Verification/Time Out Taken: Start Time: 17:28 Total Area Debrided (L x W): 7 (cm) x 10 (cm) = 70 (cm) Tissue and other material Viable, Non-Viable, Slough, Subcutaneous, Slough debrided: Level: Skin/Subcutaneous Tissue Debridement Description: Excisional Instrument: Curette Bleeding: Moderate Hemostasis Achieved: Pressure End Time: 17:30 Procedural Pain: 4 Post Procedural Pain: 2 Response to Treatment: Procedure was tolerated well Level of Consciousness Awake and Alert (Post-procedure): Post Debridement Measurements of Total Wound Length: (cm) 11.5 Width: (cm) 15.4 Depth: (cm) 0.3 Volume: (cm) 41.728 Character of Wound/Ulcer Post Improved Debridement: Post Procedure Diagnosis Same as Pre-procedure Electronic Signature(s) Signed: 04/28/2019 6:14:31 PM By: Erin Hurst RN, BSN Signed: 04/28/2019 6:16:05 PM By: Erin Ham MD Entered By: Erin [name] on 04/28/2019 18:06:38 -------------------------------------------------------------------------------- HPI Details Patient Name: Date of Service: Erin [name] 04/28/2019 3:30 PM Medical Record [id number] Patient Account Number: [id number] Date of Birth/Sex: Treating RN: 11/11/1930 (86 y.o. Erin [name] Primary Care Provider: Arsenio [name] Other Clinician: Referring Provider: Treating Provider/Extender:Erin [name], Erin [name], Erin [name] in Treatment: 2 History of Present Illness HPI Description: We to find a new wound on the left 03/22/17; this is an 86 year old woman. There is not a lot of information in care everywhere on this patient. She had a history of a malignant neoplasm her left breast for which she had lumpectomy but she did not have radiation. Apparently she has had long-standing edema and her lower legs and she was seen in the wound care center at [facility] in [place] by [name] October. Both her legs were wrapped however it sounds as though the left leg became secondarily infected she apparently had MRSA and she was admitted the hospital. Not really turn for wound care. She lives at home on her own and has apparently Erin [name] home health care. We are not exactly clear what Erin [name] is doing to her legs but it does not involve compression. She is not a diabetic. ABIs in our clinic were noncompressible bilaterally 03/30/17; this is a patient who has bilateral chronic venous insufficiency, severe venous inflammation. We had a nice description number legs from a note by [name] dating 2010 describing edematous warm legs with erythema. This suggests that she has chronic venous insufficiency with inflammation.. We have arterial studies dated 12/29/16 again from [facility]. This showed a right ABI at 1 and a TBI of 0.81 on the left the ABI was 1.25 and a TBI of 0.72. This was just she does not have significant arterial insufficiency. She had normal triphasic waveforms noted that she was admitted to hospital in late October with MSSA cellulitis Last visit we wrapped both her legs. She had an open area predominantly on the left anterior but weeping edema on the right anterior leg. She has done well there is only one at anterior left tibial wound 04/06/17; the patient has no open area on her right  leg. Her remaining wound is on the left anterior leg, the left posterior leg wound is healed. We have been using silver  alginate changed to [name] on the left leg today she has home health 04/13/17; still no open area on her right leg. Her remaining wound on the left anterior leg. We have been using using Hydrofera Blue 04/20/17; no open area on the right leg although the edema here is somewhat disfiguring. Her remaining wound is on the left anterior leg we've been using Hydrofera Blue with improvement in dimensions. She has Erin [name] home health and lives in [place]. She requests to come every 2 [name] because of the distance involved in coming here 05/04/17; there is no open area on the right leg. She has a very small left anterior leg wound that remains. However what remains on the left still has some depth and a not to viable looking surface. It is too small to really attempt debridement. She has home health coming out to her home in [facility] 05/18/17; there is no open area on either leg. She has significant chronic venous insufficiency. The left anterior leg wound that was still open 2 [name] ago has closed. She has home health coming out. We are going to order Juxtalite stockings for both legs. She is agreed to pay for these privately. We will order them from [facility] READMISSION 04/14/2019 This is a patient we previously had in this clinic in 86 for 3 months. She had a wound on the left anterior greater than right anterior leg. According to my notes we discharged her and juxta lite stockings although it does not appear that she ever got them. The history here is a bit difficult. She apparently has had a wound on the left lower leg for the last 6 months. She required admission to hospital in [facility] for apparent cellulitis just before Christmas was discharged home she has an open wound. She has a home health agency although we are not exactly sure which one. They are applying silver  alginate. No compression Past medical history is reviewed she has chronic venous insufficiency and a history of left breast CA. 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 She did not have any compression on the wounds today. 2/8; substantial wound on the right posterior calf and a smaller area on the left medial calf. We are using Iodoflex to both wound areas under compression. We have better edema control We to find a new wound on the left anterior medial calf 2/15; the area on the left medial calf is closed over. Substantial area on the right posterior and lateral calf. We have been using Iodoflex although the patient complains of pain and wonders if there is not an alternative. She has home health changing the dressing twice a week and she sees [place] once Electronic Signature(s) Signed: 04/28/2019 6:16:05 PM By: Erin Ham MD Entered By: Erin [name] on 04/28/2019 18:07:19 -------------------------------------------------------------------------------- Physical Exam Details Patient Name: Date of Service: Venia Carbon A. 04/28/2019 3:30 PM Medical Record [id number] Patient Account Number: [id number] Date of Birth/Sex: Treating RN: 13-Dec-1930 (86 y.o. Erin [name] Primary Care Provider: Arsenio [name] Other Clinician: Referring Provider: Treating Provider/Extender:Henderson Frampton, Erin [name], [name] [name] in Treatment: 2 Notes Wound exam; the wound has a considerable improvement even under illumination there is less debris. Still an area of the wound required an extensive difficult debridement with a large open curette. Hemostasis with direct pressure Electronic Signature(s) Signed: 04/28/2019 6:16:05 PM By: Erin Ham  MD Entered By: Erin [name] on 04/28/2019  18:07:46 -------------------------------------------------------------------------------- Physician Orders Details Patient Name: Date of Service: FELIPA, LAROCHE 04/28/2019 3:30 PM Medical Record [id number] Patient Account Number: [id number] Date of Birth/Sex: Treating RN: 1930-11-27 (86 y.o. Erin [name] Primary Care Provider: Arsenio [name] Other Clinician: Referring Provider: Treating Provider/Extender:Jacorey Donaway, Erin [name], [name] in Treatment: 2 Verbal / Phone Orders: No Diagnosis Coding ICD-10 Coding Code Description I87.331 Chronic venous hypertension (idiopathic) with ulcer and inflammation of right lower extremity L97.812 Non-pressure chronic ulcer of other part of right lower leg with fat layer exposed I89.0 Lymphedema, not elsewhere classified L97.821 Non-pressure chronic ulcer of other part of left lower leg limited to breakdown of skin Follow-up Appointments Return Appointment in 1 week. Dressing Change Frequency Wound #6 Right Lower Leg Change dressing three times week. - wound clinic to change 1x/week on Monday's, home health to change 2x/week on Wednesday's and Friday's Skin Barriers/Peri-Wound Care Barrier cream Moisturizing lotion Wound Cleansing May shower with protection. - use cast protector Primary Wound Dressing Wound #6 Right Lower Leg Hydrofera Blue - continue to use Iodoflex until Hydrofera Blue available Secondary Dressing Wound #6 Right Lower Leg ABD pad Zetuvit or Kerramax - or Xtrasorb (or other superabsorbent dressing) Edema Control 3 Layer Compression System - Right Lower Extremity Avoid standing for long periods of time Elevate legs to the level of the heart or above for 30 minutes daily and/or when sitting, a frequency of: - throughout the day [facility] for wound care. - [facility] Electronic Signature(s) Signed: 04/28/2019 6:14:31 PM By: Erin Hurst RN, BSN Signed: 04/28/2019  6:16:05 PM By: Erin Ham MD Entered By: Erin [name] on 04/28/2019 17:33:10 -------------------------------------------------------------------------------- Problem List Details Patient Name: Date of Service: Erin [name] 04/28/2019 3:30 PM Medical Record [id number] Patient Account Number: [id number] Date of Birth/Sex: Treating RN: October 18, 1930 (86 y.o. Erin [name] Primary Care Provider: Other Clinician: Arsenio [name] Referring Provider: Treating Provider/Extender:Caleb Decock, Erin [name], [name] in Treatment: 2 Active Problems ICD-10 Evaluated Encounter Code Description Active Date Today Diagnosis I87.331 Chronic venous hypertension (idiopathic) with ulcer 04/14/2019 No Yes and inflammation of right lower extremity L97.812 Non-pressure chronic ulcer of other part of right lower 04/14/2019 No Yes leg with fat layer exposed I89.0 Lymphedema, not elsewhere classified 04/14/2019 No Yes L97.821 Non-pressure chronic ulcer of other part of left lower 04/21/2019 No Yes leg limited to breakdown of skin Inactive Problems Resolved Problems Electronic Signature(s) Signed: 04/28/2019 6:16:05 PM By: Erin Ham MD Entered By: Erin [name] on 04/28/2019 18:05:55 -------------------------------------------------------------------------------- Progress Note Details Patient Name: Date of Service: Erin [name] 04/28/2019 3:30 PM Medical Record [id number] Patient Account Number: [id number] Date of Birth/Sex: Treating RN: 12/17/1930 (86 y.o. Erin [name] Primary Care Provider: Arsenio [name] Other Clinician: Referring Provider: Treating Provider/Extender:Haniyah Maciolek, Erin [name], Erin [name] in Treatment: 2 Subjective History of Present Illness (HPI) We to find a new wound on the left 03/22/17; this is an 86 year old woman. There is not a lot of information in care everywhere on this patient. She had a history of a malignant neoplasm her left breast  for which she had lumpectomy but she did not have radiation. Apparently she has had long-standing edema and her lower legs and she was seen in the wound care center at [facility] in [place] by [name] October. Both her legs were wrapped however it sounds as though the left leg became secondarily infected she apparently had MRSA and she was admitted the hospital. Not really  turn for wound care. She lives at home on her own and has apparently Erin [name] home health care. We are not exactly clear what Erin [name] is doing to her legs but it does not involve compression. She is not a diabetic. ABIs in our clinic were noncompressible bilaterally 03/30/17; this is a patient who has bilateral chronic venous insufficiency, severe venous inflammation. We had a nice description number legs from a note by [name] dating 2010 describing edematous warm legs with erythema. This suggests that she has chronic venous insufficiency with inflammation.. We have arterial studies dated 12/29/16 again from [facility]. This showed a right ABI at 1 and a TBI of 0.81 on the left the ABI was 1.25 and a TBI of 0.72. This was just she does not have significant arterial insufficiency. She had normal triphasic waveforms noted that she was admitted to hospital in late October with MSSA cellulitis Last visit we wrapped both her legs. She had an open area predominantly on the left anterior but weeping edema on the right anterior leg. She has done well there is only one at anterior left tibial wound 04/06/17; the patient has no open area on her right leg. Her remaining wound is on the left anterior leg, the left posterior leg wound is healed. We have been using silver alginate changed to [facility] on the left leg today she has home health 04/13/17; still no open area on her right leg. Her remaining wound on the left anterior leg. We have been using using Hydrofera Blue 04/20/17; no open area on the right leg  although the edema here is somewhat disfiguring. Her remaining wound is on the left anterior leg we've been using Hydrofera Blue with improvement in dimensions. She has [name] home health and lives in [place]. She requests to come every 2 [name] because of the distance involved in coming here 05/04/17; there is no open area on the right leg. She has a very small left anterior leg wound that remains. However what remains on the left still has some depth and a not to viable looking surface. It is too small to really attempt debridement. She has home health coming out to her home in [facility] 05/18/17; there is no open area on either leg. She has significant chronic venous insufficiency. The left anterior leg wound that was still open 2 [name] ago has closed. She has home health coming out. We are going to order Juxtalite stockings for both legs. She is agreed to pay for these privately. We will order them from [facility] READMISSION 04/14/2019 This is a patient we previously had in this clinic in 86 for 3 months. She had a wound on the left anterior greater than right anterior leg. According to my notes we discharged her and juxta lite stockings although it does not appear that she ever got them. The history here is a bit difficult. She apparently has had a wound on the left lower leg for the last 6 months. She required admission to hospital in [facility] for apparent cellulitis just before Christmas was discharged home she has an open wound. She has a home health agency although we are not exactly sure which one. They are applying silver alginate. No compression Past medical history is reviewed she has chronic venous insufficiency and a history of left breast CA. She also has a history of MRSA in the wound ABIs were not done in the clinic today however she did have noninvasive arterial studies in 2018 which  are actually quite good. She did not have any compression on the wounds today. 2/8;  substantial wound on the right posterior calf and a smaller area on the left medial calf. We are using Iodoflex to both wound areas under compression. We have better edema control We to find a new wound on the left anterior medial calf 2/15; the area on the left medial calf is closed over. Substantial area on the right posterior and lateral calf. We have been using Iodoflex although the patient complains of pain and wonders if there is not an alternative. She has home health changing the dressing twice a week and she sees [place] once Objective Constitutional Vitals Time Taken: 4:55 PM, Height: 60 in, Weight: 130 lbs, BMI: 25.4, Temperature: 98.1 F, Pulse: 74 bpm, Respiratory Rate: 18 breaths/min, Blood Pressure: 165/58 mmHg. Integumentary (Hair, Skin) Wound #6 status is Open. Original cause of wound was Gradually Appeared. The wound is located on the Right Lower Leg. The wound measures 11.5cm length x 15.4cm width x 0.3cm depth; 139.094cm^2 area and 41.728cm^3 volume. There is Fat Layer (Subcutaneous Tissue) Exposed exposed. There is no tunneling or undermining noted. There is a large amount of purulent drainage noted. The wound margin is well defined and not attached to the wound base. There is medium (34-66%) red, pink granulation within the wound bed. There is a medium (34-66%) amount of necrotic tissue within the wound bed including Adherent Slough. Wound #7 status is Healed - Epithelialized. Original cause of wound was Gradually Appeared. The wound is located on the Left,Medial Lower Leg. The wound measures 0cm length x 0cm width x 0cm depth; 0cm^2 area and 0cm^3 volume. There is no tunneling or undermining noted. There is a none present amount of drainage noted. The wound margin is distinct with the outline attached to the wound base. There is no granulation within the wound bed. There is no necrotic tissue within the wound bed. Assessment Active Problems ICD-10 Chronic venous  hypertension (idiopathic) with ulcer and inflammation of right lower extremity Non-pressure chronic ulcer of other part of right lower leg with fat layer exposed Lymphedema, not elsewhere classified Non-pressure chronic ulcer of other part of left lower leg limited to breakdown of skin Procedures Wound #6 Pre-procedure diagnosis of Wound #6 is a Lymphedema located on the Right Lower Leg . There was a Excisional Skin/Subcutaneous Tissue Debridement with a total area of 70 sq cm performed by Erin [name]., MD. With the following instrument(s): Curette to remove Viable and Non-Viable tissue/material. Material removed includes Subcutaneous Tissue and Slough and. No specimens were taken. A time out was conducted at 17:28, prior to the start of the procedure. A Moderate amount of bleeding was controlled with Pressure. The procedure was tolerated well with a pain level of 4 throughout and a pain level of 2 following the procedure. Post Debridement Measurements: 11.5cm length x 15.4cm width x 0.3cm depth; 41.728cm^3 volume. Character of Wound/Ulcer Post Debridement is improved. Post procedure Diagnosis Wound #6: Same as Pre-Procedure Pre-procedure diagnosis of Wound #6 is a Lymphedema located on the Right Lower Leg . There was a Three Layer Compression Therapy Procedure by Erin Hurst, RN. Post procedure Diagnosis Wound #6: Same as Pre-Procedure Plan Follow-up Appointments: Return Appointment in 1 week. Dressing Change Frequency: Wound #6 Right Lower Leg: Change dressing three times week. - wound clinic to change 1x/week on Monday's, home health to change 2x/week on Wednesday's and Friday's Skin Barriers/Peri-Wound Care: Barrier cream Moisturizing lotion Wound Cleansing: May shower with  protection. - use cast protector Primary Wound Dressing: Wound #6 Right Lower Leg: Hydrofera Blue - continue to use Iodoflex until Hydrofera Blue available Secondary Dressing: Wound #6 Right Lower  Leg: ABD pad Zetuvit or Kerramax - or Xtrasorb (or other superabsorbent dressing) Edema Control: 3 Layer Compression System - Right Lower Extremity Avoid standing for long periods of time Elevate legs to the level of the heart or above for 30 minutes daily and/or when sitting, a frequency of: - throughout the day Home Health: [facility] for wound care. - [facility] 1. I change the primary dressing to [facility] which should help with the pain. They are going to continue to use Iodoflex until home health can come up with the [facility]. 2. I emphasized that the Iodoflex is done a great job cleaning up the wound. 3. Still under 3 layer compression Electronic Signature(s) Signed: 04/28/2019 6:16:05 PM By: Erin Ham MD Entered By: Erin [name] on 04/28/2019 18:08:43 -------------------------------------------------------------------------------- SuperBill Details Patient Name: Date of Service: Erin [name] 04/28/2019 Medical Record [id number] Patient Account Number: [id number] Date of Birth/Sex: Treating RN: 1930/10/14 (86 y.o. Erin [name] Primary Care Provider: Arsenio [name] Other Clinician: Referring Provider: Treating Provider/Extender:Caisley Baxendale, Erin [name], Erin [name] in Treatment: 2 Diagnosis Coding ICD-10 Codes Code Description I87.331 Chronic venous hypertension (idiopathic) with ulcer and inflammation of right lower extremity L97.812 Non-pressure chronic ulcer of other part of right lower leg with fat layer exposed I89.0 Lymphedema, not elsewhere classified L97.821 Non-pressure chronic ulcer of other part of left lower leg limited to breakdown of skin Facility Procedures CPT4 Code Description: [id number] 11042 - DEB SUBQ TISSUE 20 SQ CM/< ICD-10 Diagnosis Description A21.308 Non-pressure chronic ulcer of other part of right lower le Modifier: g with fat laye Quantity: 1 r exposed CPT4 Code Description: [id number]  11045 - DEB SUBQ TISS EA ADDL 20CM ICD-10 Diagnosis Description E95.284 Non-pressure chronic ulcer of other part of right lower le Modifier: g with fat laye Quantity: 3 r exposed Physician Procedures CPT4 Code Description: 1324401 11042 - WC PHYS SUBQ TISS 20 SQ CM ICD-10 Diagnosis Description U27.253 Non-pressure chronic ulcer of other part of right lower leg Modifier: with fat laye Quantity: 1 r exposed CPT4 Code Description: 6644034 74259 - WC PHYS SUBQ TISS EA ADDL 20 CM ICD-10 Diagnosis Description L97.812 Non-pressure chronic ulcer of other part of right lower leg Modifier: with fat laye Quantity: 3 r exposed Electronic Signature(s) Signed: 04/28/2019 6:16:05 PM By: Erin Ham MD Entered By: Erin [name] on 04/28/2019 18:09:04

## 2019-04-28 NOTE — Progress Notes (Addendum)
Erin [name], Erin [name] ([id number]) Visit Report for 04/28/2019 Arrival Information Details Patient Name: Date of Service: Erin [name], Erin [name] 04/28/2019 3:30 PM Medical Record [id number] Patient Account Number: [id number] Date of Birth/Sex: Treating RN: April 28, 1930 (86 y.o. Erin [name] Primary Care Addylin Manke: [name] Other Clinician: Referring Loris Winrow: Treating Shacora Zynda/Extender:Robson, [name], [name] in Treatment: 2 Visit Information History Since Last Visit Added or deleted any medications: No Patient Arrived: Wheel Chair Any new allergies or adverse reactions: No Arrival Time: 16:54 Had a fall or experienced change in No Accompanied By: daughter activities of daily living that may affect Transfer Assistance: EasyPivot Patient risk of falls: Lift Signs or symptoms of abuse/neglect since last No Patient Identification Verified: Yes visito Secondary Verification Process Yes Hospitalized since last visit: No Completed: Implantable device outside of the clinic excluding No Patient Requires Transmission- No cellular tissue based products placed in the center Based Precautions: since last visit: Patient Has Alerts: Yes Has Dressing in Place as Prescribed: Yes Patient Alerts: NON COMPRESSABLE Has Compression in Place as Prescribed: Yes Pain Present Now: No Electronic Signature(s) Signed: 04/28/2019 6:12:41 PM By: Kela Millin Entered By: Kela Millin on 04/28/2019 16:56:06 -------------------------------------------------------------------------------- Compression Therapy Details Patient Name: Date of Service: Erin [name] 04/28/2019 3:30 PM Medical Record [id number] Patient Account Number: [id number] Date of Birth/Sex: Treating RN: Nov 15, 1930 (86 y.o. Erin [name] Primary Care Joesphine Schemm: [name] Other Clinician: Referring Momin Misko: Treating Liane Tribbey/Extender:Robson, [name], [name] Weeks in  Treatment: 2 Compression Therapy Performed for Wound Wound #6 Right Lower Leg Assessment: Performed By: Clinician [name], RN Compression Type: Three Layer Post Procedure Diagnosis Same as Pre-procedure Electronic Signature(s) Signed: 04/28/2019 6:14:31 PM By: [name] RN, BSN Entered By: [name] on 04/28/2019 17:31:46 -------------------------------------------------------------------------------- Encounter Discharge Information Details Patient Name: Date of Service: Erin [name] 04/28/2019 3:30 PM Medical Record [id number] Patient Account Number: [id number] Date of Birth/Sex: Treating RN: Jan 23, 1931 (86 y.o. Erin [name] Primary Care Alice Burnside: [name] Other Clinician: Referring Marguerita Stapp: Treating Bill Yohn/Extender:Robson, [name], [name] in Treatment: 2 Encounter Discharge Information Items Post Procedure Vitals Discharge Condition: Stable Temperature (F): 98.1 Ambulatory Status: Wheelchair Pulse (bpm): 74 Discharge Destination: Home Respiratory Rate (breaths/min): 18 Transportation: Private Auto Blood Pressure (mmHg): 165/58 Accompanied By: caregiver Schedule Follow-up Appointment: Yes Clinical Summary of Care: Electronic Signature(s) Signed: 04/28/2019 6:19:44 PM By: Deon Pilling Entered By: Deon Pilling on 04/28/2019 18:18:31 -------------------------------------------------------------------------------- Lower Extremity Assessment Details Patient Name: Date of Service: Erin [name] 04/28/2019 3:30 PM Medical Record [id number] Patient Account Number: [id number] Date of Birth/Sex: Treating RN: [birth year] (86 y.o. Erin [name] Primary Care Gray Doering: [name] Other Clinician: Referring Lachelle Rissler: Treating Lacorey Brusca/Extender:Robson, [name], [name] Weeks in Treatment: 2 Edema Assessment Assessed: [Left: No] [Right: No] E[Left: dema] [Right: :] Calf Left: Right: Point of  Measurement: 35 cm From Medial Instep cm 28 cm Ankle Left: Right: Point of Measurement: 7 cm From Medial Instep cm 24 cm Vascular Assessment Pulses: Dorsalis Pedis Palpable: [Right:Yes] Electronic Signature(s) Signed: 04/28/2019 6:12:41 PM By: Kela Millin Entered By: Kela Millin on 04/28/2019 17:04:14 -------------------------------------------------------------------------------- Multi Wound Chart Details Patient Name: Date of Service: Erin [name] 04/28/2019 3:30 PM Medical Record [id number] Patient Account Number: [id number] Date of Birth/Sex: Treating RN: May 04, 1930 (86 y.o. Erin [name] Primary Care Nyjah Denio: [name] Other Clinician: Referring Reagen Goates: Treating Daje Stark/Extender:Robson, [name], [name] Weeks in Treatment: 2 Vital Signs Height(in): 60 Pulse(bpm): 74 Weight(lbs): 130 Blood Pressure(mmHg): 165/58 Body Mass Index(BMI): 25 Temperature(F): 98.1 Respiratory 18 Rate(breaths/min): Photos: [6:No Photos] [7:No  Photos] [N/A:N/A] Wound Location: [6:Right Lower Leg] [7:Left Lower Leg - Medial] [N/A:N/A] Wounding Event: [6:Gradually Appeared] [7:Gradually Appeared] [N/A:N/A] Primary Etiology: [6:Lymphedema] [7:Lymphedema] [N/A:N/A] Comorbid History: [6:Cataracts, Glaucoma, Middle ear problems, Anemia, Hypertension] [7:Cataracts, Glaucoma, Middle ear problems, Anemia, Hypertension] [N/A:N/A] Date Acquired: [6:03/14/2019] [7:04/21/2019] [N/A:N/A] Weeks of Treatment: [6:2] [7:1] [N/A:N/A] Wound Status: [6:Open] [7:Healed - Epithelialized] [N/A:N/A] Measurements L x W x D 11.5x15.4x0.3 [7:0x0x0] [N/A:N/A] (cm) Area (cm) : [6:139.094] [7:0] [N/A:N/A] Volume (cm) : [6:41.728] [7:0] [N/A:N/A] % Reduction in Area: [6:-3.60%] [7:100.00%] [N/A:N/A] % Reduction in Volume: 22.30% [7:100.00%] [N/A:N/A] Classification: [6:Full Thickness Without Exposed Support Structures Exposed Support Structures] [7:Full Thickness Without]  [N/A:N/A] Exudate Amount: [6:Large] [7:None Present] [N/A:N/A] Exudate Type: [6:Purulent] [7:N/A] [N/A:N/A] Exudate Color: [6:yellow, brown, green] [7:N/A] [N/A:N/A] Wound Margin: [6:Well defined, not attached Distinct, outline attached N/A] Granulation Amount: [6:Medium (34-66%)] [7:None Present (0%)] [N/A:N/A] Granulation Quality: [6:Red, Pink] [7:N/A] [N/A:N/A] Necrotic Amount: [6:Medium (34-66%)] [7:None Present (0%)] [N/A:N/A] Exposed Structures: [6:Fat Layer (Subcutaneous Fascia: No Tissue) Exposed: Yes Fascia: No Tendon: No Muscle: No Joint: No Bone: No] [7:Fat Layer (Subcutaneous Tissue) Exposed: No Tendon: No Muscle: No Joint: No Bone: No] [N/A:N/A] Epithelialization: [6:None] [7:None] [N/A:N/A] Debridement: [6:Debridement - Excisional N/A] [N/A:N/A] Pre-procedure [6:17:28] [7:N/A] [N/A:N/A] Verification/Time Out Taken: Tissue Debrided: [6:Subcutaneous, Slough] [7:N/A] [N/A:N/A] Level: [6:Skin/Subcutaneous Tissue] [7:N/A] [N/A:N/A] Debridement Area (sq cm):177.1 [7:N/A] [N/A:N/A] Instrument: [6:Curette] [7:N/A] [N/A:N/A] Bleeding: [6:Moderate] [7:N/A] [N/A:N/A] Hemostasis Achieved: [6:Pressure] [7:N/A] [N/A:N/A] Procedural Pain: [6:4] [7:N/A] [N/A:N/A] Post Procedural Pain: [6:2] [7:N/A] [N/A:N/A] Debridement Treatment Procedure was tolerated [7:N/A] [N/A:N/A] Response: [6:well] Post Debridement [6:11.5x15.4x0.3] [7:N/A] [N/A:N/A] Measurements L x W x D (cm) Post Debridement [6:41.728] [7:N/A] [N/A:N/A] Volume: (cm) Procedures Performed: Compression Therapy [6:Debridement] [7:N/A] [N/A:N/A] Treatment Notes Electronic Signature(s) Signed: 04/28/2019 6:14:31 PM By: [name] RN, BSN Signed: 04/28/2019 6:16:05 PM By: Linton Ham MD Entered By: Linton Ham on 04/28/2019 18:06:03 -------------------------------------------------------------------------------- Multi-Disciplinary Care Plan Details Patient Name: Date of Service: Erin Carbon A. 04/28/2019  3:30 PM Medical Record [id number] Patient Account Number: [id number] Date of Birth/Sex: Treating RN: June 04, 1930 (86 y.o. Erin [name] Primary Care Lamount Bankson: [name] Other Clinician: Referring Rhiann Boucher: Treating Falyn Rubel/Extender:Robson, [name], [name] in Treatment: 2 Active Inactive Abuse / Safety / Falls / Self Care Management Nursing Diagnoses: Potential for falls Potential for injury related to falls Goals: Patient will not experience any injury related to falls Date Initiated: 04/14/2019 Target Resolution Date: 05/16/2019 Goal Status: Active Patient/caregiver will verbalize/demonstrate measures taken to improve the patient's personal safety Date Initiated: 04/14/2019 Target Resolution Date: 05/16/2019 Goal Status: Active Patient/caregiver will verbalize/demonstrate measures taken to prevent injury and/or falls Date Initiated: 04/14/2019 Target Resolution Date: 05/16/2019 Goal Status: Active Interventions: Assess Activities of Daily Living upon admission and as needed Assess fall risk on admission and as needed Assess: immobility, friction, shearing, incontinence upon admission and as needed Assess impairment of mobility on admission and as needed per policy Assess personal safety and home safety (as indicated) on admission and as needed Assess self care needs on admission and as needed Provide education on fall prevention Provide education on personal and home safety Notes: Venous Leg Ulcer Nursing Diagnoses: Actual venous Insuffiency (use after diagnosis is confirmed) Knowledge deficit related to disease process and management Goals: Patient will maintain optimal edema control Date Initiated: 04/14/2019 Target Resolution Date: 05/16/2019 Goal Status: Active Patient/caregiver will verbalize understanding of disease process and disease management Date Initiated: 04/14/2019 Target Resolution Date: 05/16/2019 Goal Status: Active Interventions: Assess  peripheral edema status every visit. Compression as ordered Provide education on  venous insufficiency Notes: Wound/Skin Impairment Nursing Diagnoses: Impaired tissue integrity Knowledge deficit related to ulceration/compromised skin integrity Goals: Patient/caregiver will verbalize understanding of skin care regimen Date Initiated: 04/14/2019 Target Resolution Date: 05/16/2019 Goal Status: Active Ulcer/skin breakdown will have a volume reduction of 30% by week 4 Date Initiated: 04/14/2019 Target Resolution Date: 05/16/2019 Goal Status: Active Interventions: Assess patient/caregiver ability to obtain necessary supplies Assess patient/caregiver ability to perform ulcer/skin care regimen upon admission and as needed Assess ulceration(s) every visit Provide education on ulcer and skin care Notes: Electronic Signature(s) Signed: 04/28/2019 6:14:31 PM By: [name] RN, BSN Entered By: [name] on 04/28/2019 17:15:24 -------------------------------------------------------------------------------- Pain Assessment Details Patient Name: Date of Service: Erin [name] 04/28/2019 3:30 PM Medical Record [id number] Patient Account Number: [id number] Date of Birth/Sex: Treating RN: 05/26/1930 (86 y.o. Erin [name] Primary Care Marquice Uddin: [name] Other Clinician: Referring Kwamaine Cuppett: Treating Nicholaus Steinke/Extender:Robson, [name], [name] Weeks in Treatment: 2 Active Problems Location of Pain Severity and Description of Pain Patient Has Paino No Site Locations Pain Management and Medication Current Pain Management: Electronic Signature(s) Signed: 04/28/2019 6:12:41 PM By: Kela Millin Entered By: Kela Millin on 04/28/2019 16:58:28 Patient/Caregiver Education Details Date of Service: -------------------------------------------------------------------------------- Erin Carbon 2/15/2021andnbsp3:30 PM Patient Name: A. Patient Account  Number: [id number] Medical Record Treating RN: [name] [id number] Number: Other Clinician: Date of Birth/Gender: 04-May-1930 (86 y.o. F) Treating Linton Ham Primary Care Physician: [name] Physician/Extender: Referring Physician: Tomasa Blase in Treatment: 2 Education Assessment Education Provided To: Patient Education Topics Provided Wound/Skin Impairment: Methods: Explain/Verbal Responses: State content correctly Electronic Signature(s) Signed: 04/28/2019 6:14:31 PM By: [name] RN, BSN Entered By: [name] on 04/28/2019 17:15:36 -------------------------------------------------------------------------------- Wound Assessment Details Patient Name: Date of Service: Erin [name] 04/28/2019 3:30 PM Medical Record [id number] Patient Account Number: [id number] Date of Birth/Sex: Treating RN: 05-19-1930 (86 y.o. Erin [name] Primary Care Mariza Bourget: [name] Other Clinician: Referring Sheilla Maris: Treating Jorje Vanatta/Extender:Robson, [name], [name] Weeks in Treatment: 2 Wound Status Wound Number: 6 Primary Lymphedema Etiology: Wound Location: Right Lower Leg Wound Open Wounding Event: Gradually Appeared Status: Date Acquired: 03/14/2019 Comorbid Cataracts, Glaucoma, Middle ear problems, Weeks Of Treatment: 2 History: Anemia, Hypertension Clustered Wound: No Photos Wound Measurements Length: (cm) 11.5 % Reduct Width: (cm) 15.4 % Reduct Depth: (cm) 0.3 Epitheli Area: (cm) 139.094 Tunneli Volume: (cm) 41.728 Undermi Wound Description Full Thickness Without Exposed Support Foul Odo Classification: Structures Slough/F Wound Well defined, not attached Margin: Exudate Large Amount: Exudate Purulent Type: Exudate yellow, brown, green Color: Wound Bed Granulation Amount: Medium (34-66%) Granulation Quality: Red, Pink Fascia E Necrotic Amount: Medium (34-66%) Fat Laye Necrotic Quality: Adherent Slough  Tendon E Muscle E Joint Ex Bone Exp r After Cleansing: No ibrino Yes Exposed Structure xposed: No r (Subcutaneous Tissue) Exposed: Yes xposed: No xposed: No posed: No osed: No ion in Area: -3.6% ion in Volume: 22.3% alization: None ng: No ning: No Electronic Signature(s) Signed: 04/29/2019 4:25:10 PM By: Mikeal Hawthorne EMT/HBOT Signed: 05/08/2019 8:56:47 AM By: [name] RN, BSN Previous Signature: 04/28/2019 6:12:41 PM Version By: Kela Millin Entered By: Mikeal Hawthorne on 04/29/2019 14:04:45 -------------------------------------------------------------------------------- Wound Assessment Details Patient Name: Date of Service: Erin Carbon A. 04/28/2019 3:30 PM Medical Record [id number] Patient Account Number: [id number] Date of Birth/Sex: Treating RN: [birth year] (86 y.o. Erin [name] Primary Care Kaeya Schiffer: [name] Other Clinician: Referring Oluwaseun Cremer: Treating Navaya Wiatrek/Extender:Robson, [name], [name] Weeks in Treatment: 2 Wound Status Wound Number: 7 Primary Lymphedema Etiology: Wound Location: Left Lower Leg - Medial  Wound Healed - Epithelialized Wounding Event: Gradually Appeared Status: Date Acquired: 04/21/2019 Comorbid Cataracts, Glaucoma, Middle ear problems, Weeks Of Treatment: 1 History: Anemia, Hypertension Clustered Wound: No Photos Wound Measurements Length: (cm) 0 % Reduct Width: (cm) 0 % Reduct Depth: (cm) 0 Epitheli Area: (cm) 0 Tunneli Volume: (cm) 0 Undermi Wound Description Classification: Full Thickness Without Exposed Support Foul Odo Structures Slough/F Wound Distinct, outline attached Margin: Exudate None Present Amount: Wound Bed Granulation Amount: None Present (0%) Necrotic Amount: None Present (0%) Fascia E Fat Laye Tendon E Muscle E Joint Ex Bone Exp r After Cleansing: No ibrino No Exposed Structure xposed: No r (Subcutaneous Tissue) Exposed: No xposed: No xposed: No posed:  No osed: No ion in Area: 100% ion in Volume: 100% alization: None ng: No ning: No Electronic Signature(s) Signed: 04/29/2019 4:25:10 PM By: Mikeal Hawthorne EMT/HBOT Signed: 05/08/2019 8:56:47 AM By: [name] RN, BSN Previous Signature: 04/28/2019 6:12:41 PM Version By: Kela Millin Entered By: Mikeal Hawthorne on 04/29/2019 14:04:16 -------------------------------------------------------------------------------- Vitals Details Patient Name: Date of Service: Erin Carbon A. 04/28/2019 3:30 PM Medical Record [id number] Patient Account Number: [id number] Date of Birth/Sex: Treating RN: 1930-06-29 (86 y.o. Erin [name] Primary Care Jahnaya Branscome: [name] Other Clinician: Referring Andreya Lacks: Treating Pernell Lenoir/Extender:Robson, [name], [name] Weeks in Treatment: 2 Vital Signs Time Taken: 16:55 Temperature (F): 98.1 Height (in): 60 Pulse (bpm): 74 Weight (lbs): 130 Respiratory Rate (breaths/min): 18 Body Mass Index (BMI): 25.4 Blood Pressure (mmHg): 165/58 Reference Range: 80 - 120 mg / dl Electronic Signature(s) Signed: 04/28/2019 6:12:41 PM By: Kela Millin Entered By: Kela Millin on 04/28/2019 16:58:12

## 2019-05-05 ENCOUNTER — Other Ambulatory Visit: Payer: Self-pay

## 2019-05-05 ENCOUNTER — Encounter (HOSPITAL_BASED_OUTPATIENT_CLINIC_OR_DEPARTMENT_OTHER): Payer: Medicare HMO | Admitting: Internal Medicine

## 2019-05-05 DIAGNOSIS — L97812 Non-pressure chronic ulcer of other part of right lower leg with fat layer exposed: Secondary | ICD-10-CM | POA: Diagnosis not present

## 2019-05-08 NOTE — Progress Notes (Signed)
Erin [name] ([id number]) Visit Report for 05/05/2019 Arrival Information Details Patient Name: Date of Service: Erin [name], Erin [name] 05/05/2019 3:30 PM Medical Record [id number] Patient Account Number: [id number] Date of Birth/Sex: Treating RN: 1930-07-03 (86 y.o. [name] Primary Care Xylah Early: [name] Other Clinician: Referring Jquan Egelston: Treating Alhassan Everingham/Extender:Robson, [name], [name] in Treatment: 3 Visit Information History Since Last Visit Added or deleted any medications: No Patient Arrived: Wheel Chair Any new allergies or adverse reactions: No Arrival Time: 15:50 Had a fall or experienced change in No Accompanied By: self activities of daily living that may affect Transfer Assistance: EasyPivot Patient risk of falls: Lift Signs or symptoms of abuse/neglect since last No Patient Identification Verified: Yes visito Secondary Verification Process Yes Hospitalized since last visit: No Completed: Implantable device outside of the clinic excluding No Patient Requires Transmission- No cellular tissue based products placed in the center Based Precautions: since last visit: Patient Has Alerts: Yes Has Dressing in Place as Prescribed: Yes Patient Alerts: NON COMPRESSABLE Has Compression in Place as Prescribed: Yes Pain Present Now: No Electronic Signature(s) Signed: 05/05/2019 5:29:42 PM By: Kela Millin Entered By: Kela Millin on 05/05/2019 15:51:04 -------------------------------------------------------------------------------- Compression Therapy Details Patient Name: Date of Service: Erin [name] 05/05/2019 3:30 PM Medical Record [id number] Patient Account Number: [id number] Date of Birth/Sex: Treating RN: 10-09-1930 (86 y.o. [name] Primary Care Ruba Outen: [name] Other Clinician: Referring Andris Brothers: Treating Providence Stivers/Extender:Robson, [name], [name] Weeks in Treatment:  3 Compression Therapy Performed for Wound Wound #6 Right Lower Leg Assessment: Performed By: Clinician [name], RN Compression Type: Three Layer Post Procedure Diagnosis Same as Pre-procedure Electronic Signature(s) Signed: 05/08/2019 8:56:01 AM By: [name] RN, BSN Entered By: [name] on 05/05/2019 16:53:39 -------------------------------------------------------------------------------- Encounter Discharge Information Details Patient Name: Date of Service: Erin [name] 05/05/2019 3:30 PM Medical Record [id number] Patient Account Number: [id number] Date of Birth/Sex: Treating RN: 06-15-1930 (86 y.o. [name] Primary Care Alucard Fearnow: [name] Other Clinician: Referring Travante Knee: Treating Ruchi Stoney/Extender:Robson, [name], [name] in Treatment: 3 Encounter Discharge Information Items Discharge Condition: Stable Ambulatory Status: Wheelchair Discharge Destination: Home Transportation: Private Auto Accompanied By: family member Schedule Follow-up Appointment: Yes Clinical Summary of Care: Patient Declined Electronic Signature(s) Signed: 05/05/2019 5:29:42 PM By: Kela Millin Entered By: Kela Millin on 05/05/2019 16:59:14 -------------------------------------------------------------------------------- Lower Extremity Assessment Details Patient Name: Date of Service: Erin [name] 05/05/2019 3:30 PM Medical Record [id number] Patient Account Number: [id number] Date of Birth/Sex: Treating RN: 1930-05-29 (86 y.o. [name] Primary Care Orris Perin: [name] Other Clinician: Referring Shadeed Colberg: Treating Eugenia Eldredge/Extender:Robson, [name], [name] Weeks in Treatment: 3 Edema Assessment Assessed: [Left: No] [Right: No] E[Left: dema] [Right: :] Calf Left: Right: Point of Measurement: 35 cm From Medial Instep cm 28.5 cm Ankle Left: Right: Point of Measurement: 7 cm From Medial Instep  cm 23.5 cm Vascular Assessment Pulses: Dorsalis Pedis Palpable: [Right:Yes] Electronic Signature(s) Signed: 05/05/2019 5:29:42 PM By: Kela Millin Entered By: Kela Millin on 05/05/2019 15:51:40 -------------------------------------------------------------------------------- Multi Wound Chart Details Patient Name: Date of Service: Erin [name] 05/05/2019 3:30 PM Medical Record [id number] Patient Account Number: [id number] Date of Birth/Sex: Treating RN: November 05, 1930 (86 y.o. [name] Primary Care Sandy Haye: [name] Other Clinician: Referring Aviel Davalos: Treating Camrie Stock/Extender:Robson, [name], [name] Weeks in Treatment: 3 Vital Signs Height(in): 60 Pulse(bpm): 88 Weight(lbs): 130 Blood Pressure(mmHg): 128/91 Body Mass Index(BMI): 25 Temperature(F): 98.2 Respiratory 18 Rate(breaths/min): Photos: [6:No Photos] [N/A:N/A] Wound Location: [6:Right Lower Leg] [N/A:N/A] Wounding Event: [6:Gradually Appeared] [N/A:N/A] Primary Etiology: [6:Lymphedema] [  N/A:N/A] Comorbid History: [6:Cataracts, Glaucoma, Middle ear problems, Anemia, Hypertension] [N/A:N/A] Date Acquired: [6:03/14/2019] [N/A:N/A] Weeks of Treatment: [6:3] [N/A:N/A] Wound Status: [6:Open] [N/A:N/A] Measurements L x W x D 9.2x14.8x0.1 [N/A:N/A] (cm) Area (cm) : [6:106.94] [N/A:N/A] Volume (cm) : [6:10.694] [N/A:N/A] % Reduction in Area: [6:20.40%] [N/A:N/A] % Reduction in Volume: 80.10% [N/A:N/A] Classification: [6:Full Thickness Without Exposed Support Structures] [N/A:N/A] Exudate Amount: [6:Large] [N/A:N/A] Exudate Type: [6:Serosanguineous] [N/A:N/A] Exudate Color: [6:red, brown] [N/A:N/A] Wound Margin: [6:Well defined, not attached N/A] Granulation Amount: [6:Medium (34-66%)] [N/A:N/A] Granulation Quality: [6:Red, Pink] [N/A:N/A] Necrotic Amount: [6:Small (1-33%)] [N/A:N/A] Exposed Structures: [6:Fat Layer (Subcutaneous N/A Tissue) Exposed: Yes Fascia: No Tendon:  No Muscle: No Joint: No Bone: No] Epithelialization: [6:Small (1-33%) Compression Therapy] [N/A:N/A N/A] Treatment Notes Wound #6 (Right Lower Leg) 1. Cleanse With Wound Cleanser Soap and water 2. Periwound Care Moisturizing lotion 3. Primary Dressing Applied Hydrofera Blue 4. Secondary Dressing ABD Pad Kerramax/Xtrasorb Other secondary dressing (specify in notes) Notes carboflex,netting. Electronic Signature(s) Signed: 05/05/2019 5:55:43 PM By: Linton Ham MD Signed: 05/08/2019 8:56:01 AM By: [name] RN, BSN Entered By: Linton Ham on 05/05/2019 17:39:24 -------------------------------------------------------------------------------- Multi-Disciplinary Care Plan Details Patient Name: Date of Service: ARMIE, MOREN 05/05/2019 3:30 PM Medical Record [id number] Patient Account Number: [id number] Date of Birth/Sex: Treating RN: 1930-12-22 (86 y.o. [name] Primary Care Lita Flynn: [name] Other Clinician: Referring Reona Zendejas: Treating Saniah Schroeter/Extender:Robson, [name], [name] in Treatment: 3 Active Inactive Abuse / Safety / Falls / Self Care Management Nursing Diagnoses: Potential for falls Potential for injury related to falls Goals: Patient will not experience any injury related to falls Date Initiated: 04/14/2019 Target Resolution Date: 05/16/2019 Goal Status: Active Patient/caregiver will verbalize/demonstrate measures taken to improve the patient's personal safety Date Initiated: 04/14/2019 Target Resolution Date: 05/16/2019 Goal Status: Active Patient/caregiver will verbalize/demonstrate measures taken to prevent injury and/or falls Date Initiated: 04/14/2019 Target Resolution Date: 05/16/2019 Goal Status: Active Interventions: Assess Activities of Daily Living upon admission and as needed Assess fall risk on admission and as needed Assess: immobility, friction, shearing, incontinence upon admission and as needed Assess  impairment of mobility on admission and as needed per policy Assess personal safety and home safety (as indicated) on admission and as needed Assess self care needs on admission and as needed Provide education on fall prevention Provide education on personal and home safety Notes: Venous Leg Ulcer Nursing Diagnoses: Actual venous Insuffiency (use after diagnosis is confirmed) Knowledge deficit related to disease process and management Goals: Patient will maintain optimal edema control Date Initiated: 04/14/2019 Target Resolution Date: 05/16/2019 Goal Status: Active Patient/caregiver will verbalize understanding of disease process and disease management Date Initiated: 04/14/2019 Target Resolution Date: 05/16/2019 Goal Status: Active Interventions: Assess peripheral edema status every visit. Compression as ordered Provide education on venous insufficiency Notes: Wound/Skin Impairment Nursing Diagnoses: Impaired tissue integrity Knowledge deficit related to ulceration/compromised skin integrity Goals: Patient/caregiver will verbalize understanding of skin care regimen Date Initiated: 04/14/2019 Target Resolution Date: 05/16/2019 Goal Status: Active Ulcer/skin breakdown will have a volume reduction of 30% by week 4 Date Initiated: 04/14/2019 Target Resolution Date: 05/16/2019 Goal Status: Active Interventions: Assess patient/caregiver ability to obtain necessary supplies Assess patient/caregiver ability to perform ulcer/skin care regimen upon admission and as needed Assess ulceration(s) every visit Provide education on ulcer and skin care Notes: Electronic Signature(s) Signed: 05/08/2019 8:56:01 AM By: [name] RN, BSN Entered By: [name] on 05/05/2019 18:37:34 -------------------------------------------------------------------------------- Pain Assessment Details Patient Name: Date of Service: Erin [name] 05/05/2019 3:30 PM Medical Record [id number]  Patient  Account Number: [id number] Date of Birth/Sex: Treating RN: 07-27-1930 (86 y.o. [name] Primary Care Lynelle Weiler: [name] Other Clinician: Referring Temari Schooler: Treating Shahin Knierim/Extender:Robson, [name], [name] Weeks in Treatment: 3 Active Problems Location of Pain Severity and Description of Pain Patient Has Paino No Site Locations Pain Management and Medication Current Pain Management: Electronic Signature(s) Signed: 05/05/2019 5:29:42 PM By: Kela Millin Entered By: Kela Millin on 05/05/2019 15:51:35 Patient/Caregiver Education Details Date of Service: -------------------------------------------------------------------------------- Venia Carbon 2/22/2021andnbsp3:30 PM Patient Name: A. Patient Account Number: [id number] Medical Record Treating RN: [name] [id number] Number: Other Clinician: Date of Birth/Gender: [birth year] (86 y.o. F) Treating Linton Ham Primary Care Physician: [name] Physician/Extender: Referring Physician: Tomasa Blase in Treatment: 3 Education Assessment Education Provided To: Patient Education Topics Provided Wound/Skin Impairment: Methods: Explain/Verbal Responses: State content correctly Electronic Signature(s) Signed: 05/08/2019 8:56:01 AM By: [name] RN, BSN Entered By: [name] on 05/05/2019 18:37:44 -------------------------------------------------------------------------------- Wound Assessment Details Patient Name: Date of Service: Erin [name] 05/05/2019 3:30 PM Medical Record [id number] Patient Account Number: [id number] Date of Birth/Sex: Treating RN: 05-08-1930 (86 y.o. [name] Primary Care Lugene Beougher: [name] Other Clinician: Referring Leitha Hyppolite: Treating [name] Bargo/Extender:Robson, [name], [name] Weeks in Treatment: 3 Wound Status Wound Number: 6 Primary Lymphedema Etiology: Wound Location: Right Lower Leg Wound  Open Wounding Event: Gradually Appeared Status: Date Acquired: 03/14/2019 Comorbid Cataracts, Glaucoma, Middle ear problems, Weeks Of Treatment: 3 History: Anemia, Hypertension History: Anemia, Hypertension Clustered Wound: No Photos Wound Measurements Length: (cm) 9.2 % Reduct Width: (cm) 14.8 % Reduct Depth: (cm) 0.1 Epitheli Area: (cm) 106.94 Tunneli Volume: (cm) 10.694 Undermi Wound Description Classification: Full Thickness Without Exposed Support Foul Odo Structures Slough/F Wound Well defined, not attached Margin: Exudate Large Amount: Exudate Serosanguineous Type: Exudate red, brown Color: Wound Bed Granulation Amount: Medium (34-66%) Granulation Quality: Red, Pink Fascia E Necrotic Amount: Small (1-33%) Fat Laye Necrotic Quality: Adherent Slough Tendon E Muscle E Joint Ex Bone Exp r After Cleansing: No ibrino Yes Exposed Structure xposed: No r (Subcutaneous Tissue) Exposed: Yes xposed: No xposed: No posed: No osed: No ion in Area: 20.4% ion in Volume: 80.1% alization: Small (1-33%) ng: No ning: No Treatment Notes Wound #6 (Right Lower Leg) 1. Cleanse With Wound Cleanser Soap and water 2. Periwound Care Moisturizing lotion 3. Primary Dressing Applied Hydrofera Blue 4. Secondary Dressing ABD Pad Kerramax/Xtrasorb Other secondary dressing (specify in notes) Notes carboflex,netting. Electronic Signature(s) Signed: 05/06/2019 5:16:59 PM By: Mikeal Hawthorne EMT/HBOT Signed: 05/08/2019 8:56:01 AM By: [name] RN, BSN Previous Signature: 05/05/2019 5:29:42 PM Version By: Kela Millin Entered By: Mikeal Hawthorne on 05/06/2019 12:58:22 -------------------------------------------------------------------------------- Vitals Details Patient Name: Date of Service: Erin [name] 05/05/2019 3:30 PM Medical Record [id number] Patient Account Number: [id number] Date of Birth/Sex: Treating RN: August 21, 1930 (86 y.o. [name] Primary Care Makenzee Choudhry: [name] Other Clinician: Referring Tandi Hanko: Treating Lesli Issa/Extender:Robson, [name], [name] Weeks in Treatment: 3 Vital Signs Time Taken: 15:45 Temperature (F): 98.2 Height (in): 60 Pulse (bpm): 88 Weight (lbs): 130 Respiratory Rate (breaths/min): 18 Body Mass Index (BMI): 25.4 Blood Pressure (mmHg): 128/91 Reference Range: 80 - 120 mg / dl Electronic Signature(s) Signed: 05/05/2019 5:29:42 PM By: Kela Millin Entered By: Kela Millin on 05/05/2019 15:51:30

## 2019-05-08 NOTE — Progress Notes (Signed)
Erin [name], Erin [name] ([id number]) Visit Report for 05/05/2019 HPI Details Patient Name: Date of Service: Erin [name], Erin [name] 05/05/2019 3:30 PM Medical Record [id number] Patient Account Number: [id number] Date of Birth/Sex: Treating RN: 1930-11-29 (86 y.o. [name] Primary Care Provider: [name] Other Clinician: Referring Provider: Treating Provider/Extender:Mckaylin Bastien, [name], [name] Weeks in Treatment: 3 History of Present Illness HPI Description: We to find a new wound on the left 03/22/17; this is an 86 year old woman. There is not a lot of information in care everywhere on this patient. She had a history of a malignant neoplasm her left breast for which she had lumpectomy but she did not have radiation. Apparently she has had long-standing edema and her lower legs and she was seen in the wound care center at [facility] in [place] by [name] October. Both her legs were wrapped however it sounds as though the left leg became secondarily infected she apparently had MRSA and she was admitted the hospital. Not really turn for wound care. She lives at home on her own and has apparently [facility] home health care. We are not exactly clear what [facility] is doing to her legs but it does not involve compression. She is not a diabetic. ABIs in our clinic were noncompressible bilaterally 03/30/17; this is a patient who has bilateral chronic venous insufficiency, severe venous inflammation. We had a nice description number legs from a note by [name] dating 2010 describing edematous warm legs with erythema. This suggests that she has chronic venous insufficiency with inflammation.. We have arterial studies dated 12/29/16 again from [facility]. This showed a right ABI at 1 and a TBI of 0.81 on the left the ABI was 1.25 and a TBI of 0.72. This was just she does not have significant arterial insufficiency. She had normal triphasic waveforms noted that she was  admitted to hospital in late October with MSSA cellulitis Last visit we wrapped both her legs. She had an open area predominantly on the left anterior but weeping edema on the right anterior leg. She has done well there is only one at anterior left tibial wound 04/06/17; the patient has no open area on her right leg. Her remaining wound is on the left anterior leg, the left posterior leg wound is healed. We have been using silver alginate changed to [facility] on the left leg today she has home health 04/13/17; still no open area on her right leg. Her remaining wound on the left anterior leg. We have been using using Hydrofera Blue 04/20/17; no open area on the right leg although the edema here is somewhat disfiguring. Her remaining wound is on the left anterior leg we've been using Hydrofera Blue with improvement in dimensions. She has [facility] home health and lives in [place]. She requests to come every 2 weeks because of the distance involved in coming here 05/04/17; there is no open area on the right leg. She has a very small left anterior leg wound that remains. However what remains on the left still has some depth and a not to viable looking surface. It is too small to really attempt debridement. She has home health coming out to her home in [facility] 05/18/17; there is no open area on either leg. She has significant chronic venous insufficiency. The left anterior leg wound that was still open 2 weeks ago has closed. She has home health coming out. We are going to order Juxtalite stockings for both legs. She is agreed to pay for  these privately. We will order them from [facility] READMISSION 04/14/2019 This is a patient we previously had in this clinic in 2019 for 3 months. She had a wound on the left anterior greater than right anterior leg. According to my notes we discharged her and juxta lite stockings although it does not appear that she ever got them. The history here is a bit  difficult. She apparently has had a wound on the left lower leg for the last 6 months. She required admission to hospital in [facility] for apparent cellulitis just before Christmas was discharged home she has an open wound. She has a home health agency although we are not exactly sure which one. They are applying silver alginate. No compression Past medical history is reviewed she has chronic venous insufficiency and a history of left breast CA. 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 She did not have any compression on the wounds today. 2/8; substantial wound on the right posterior calf and a smaller area on the left medial calf. We are using Iodoflex to both wound areas under compression. We have better edema control We to find a new wound on the left anterior medial calf 2/15; the area on the left medial calf is closed over. Substantial area on the right posterior and lateral calf. We have been using Iodoflex although the patient complains of pain and wonders if there is not an alternative. She has home health changing the dressing twice a week and she sees [place] once 2/22; the wound on the left medial calf is closed over. Her edema control is not good here but she cannot get on stockings. This substantial wound is on the right lateral and right posterior calf. Changed her to a [employer] last week. Gradual improvement in wound area. Electronic Signature(s) Signed: 05/05/2019 5:55:43 PM By: Linton Ham MD Entered By: Linton Ham on 05/05/2019 17:40:31 -------------------------------------------------------------------------------- Physical Exam Details Patient Name: Date of Service: Erin [name] 05/05/2019 3:30 PM Medical Record [id number] Patient Account Number: [id number] Date of Birth/Sex: Treating RN: 08-Aug-1930 (86 y.o. [name] Primary Care Provider: [name] Other Clinician: Referring Provider: Treating Provider/Extender:Margurite Duffy, [name], [name] Weeks in Treatment: 3 Constitutional Sitting or standing Blood Pressure is within target range for patient.. Pulse regular and within target range for patient.Marland Kitchen Respirations regular, non-labored and within target range.. Temperature is normal and within the target range for the patient.Marland Kitchen Appears in no distress. Cardiovascular Pedal pulses are palpable. Edema control is good. Integumentary (Hair, Skin) No erythema around the wound and no signs of infection. Psychiatric appears at normal baseline. Notes Wound exam; the wound is really made a lot of improvement in terms of the condition of the wound bed. No mechanical debridement was necessary today which is and is a major improvement in itself. The wound was debrided with Anasept and gauze. Even under illumination the surface looks quite good Electronic Signature(s) Signed: 05/05/2019 5:55:43 PM By: Linton Ham MD Entered By: Linton Ham on 05/05/2019 17:42:03 -------------------------------------------------------------------------------- Physician Orders Details Patient Name: Date of Service: Erin [name] 05/05/2019 3:30 PM Medical Record [id number] Patient Account Number: [id number] Date of Birth/Sex: Treating RN: [birth year] (86 y.o. [name] Primary Care Provider: [name] Other Clinician: Referring Provider: Treating Provider/Extender:Mattix Imhof, [name], [name] in Treatment: 3 Verbal / Phone Orders: No Diagnosis Coding ICD-10 Coding Code Description  I87.331 Chronic venous hypertension (idiopathic) with ulcer and inflammation of right lower extremity L97.812 Non-pressure chronic ulcer of other part of right lower leg with fat layer exposed I89.0 Lymphedema, not elsewhere classified L97.821 Non-pressure chronic ulcer of other part of left lower  leg limited to breakdown of skin Follow-up Appointments Return Appointment in 2 weeks. Dressing Change Frequency Wound #6 Right Lower Leg Change dressing three times week. - by home health. Home health to change 2x a week on week that patient has appt at wound clinic. Skin Barriers/Peri-Wound Care Barrier cream Moisturizing lotion Wound Cleansing May shower with protection. - use cast protector Primary Wound Dressing Wound #6 Right Lower Leg Hydrofera Blue Secondary Dressing Wound #6 Right Lower Leg ABD pad Zetuvit or Kerramax - or Xtrasorb (or other superabsorbent dressing) Edema Control 3 Layer Compression System - Right Lower Extremity Avoid standing for long periods of time Elevate legs to the level of the heart or above for 30 minutes daily and/or when sitting, a frequency of: - throughout the day [place] skilled nursing for wound care. - [facility] Electronic Signature(s) Signed: 05/05/2019 5:55:43 PM By: Linton Ham MD Signed: 05/08/2019 8:56:01 AM By: [name] RN, BSN Entered By: [name] on 05/05/2019 16:55:01 -------------------------------------------------------------------------------- Problem List Details Patient Name: Date of Service: Erin [name] 05/05/2019 3:30 PM Medical Record [id number] Patient Account Number: [id number] Date of Birth/Sex: Treating RN: [birth year] (86 y.o. [name] Primary Care Provider: [name] Other Clinician: Referring Provider: Treating Provider/Extender:Catilyn Boggus, [name], [name] in Treatment: 3 Active Problems ICD-10 Evaluated Encounter Code Description Active Date Today Diagnosis I87.331 Chronic venous hypertension (idiopathic) with ulcer 04/14/2019 No Yes and inflammation of right lower extremity L97.812 Non-pressure chronic ulcer of other part of right lower 04/14/2019 No Yes leg with fat layer exposed I89.0 Lymphedema, not elsewhere classified  04/14/2019 No Yes L97.821 Non-pressure chronic ulcer of other part of left lower 04/21/2019 No Yes leg limited to breakdown of skin Inactive Problems Resolved Problems Electronic Signature(s) Signed: 05/05/2019 5:55:43 PM By: Linton Ham MD Entered By: Linton Ham on 05/05/2019 17:39:12 -------------------------------------------------------------------------------- Progress Note Details Patient Name: Date of Service: Erin [name] 05/05/2019 3:30 PM Medical Record [id number] Patient Account Number: [id number] Date of Birth/Sex: Treating RN: 1930/03/23 (86 y.o. [name] Primary Care Provider: [name] Other Clinician: Referring Provider: Treating Provider/Extender:Mylisa Brunson, [name], [name] Weeks in Treatment: 3 Subjective History of Present Illness (HPI) We to find a new wound on the left 03/22/17; this is an 86 year old woman. There is not a lot of information in care everywhere on this patient. She had a history of a malignant neoplasm her left breast for which she had lumpectomy but she did not have radiation. Apparently she has had long-standing edema and her lower legs and she was seen in the wound care center at [facility] in [place] by [name] October. Both her legs were wrapped however it sounds as though the left leg became secondarily infected she apparently had MRSA and she was admitted the hospital. Not really turn for wound care. She lives at home on her own and has apparently [facility] home health care. We are not exactly clear what [facility] is doing to her legs but it does not involve compression. She is not a diabetic. ABIs in our clinic were noncompressible bilaterally 03/30/17; this is a patient who has bilateral chronic venous insufficiency, severe venous inflammation. We had a nice description number legs from a note by [name] dating 2010  describing edematous warm legs with erythema. This suggests that she has chronic  venous insufficiency with inflammation.. We have arterial studies dated 12/29/16 again from [facility]. This showed a right ABI at 1 and a TBI of 0.81 on the left the ABI was 1.25 and a TBI of 0.72. This was just she does not have significant arterial insufficiency. She had normal triphasic waveforms noted that she was admitted to hospital in late October with MSSA cellulitis Last visit we wrapped both her legs. She had an open area predominantly on the left anterior but weeping edema on the right anterior leg. She has done well there is only one at anterior left tibial wound 04/06/17; the patient has no open area on her right leg. Her remaining wound is on the left anterior leg, the left posterior leg wound is healed. We have been using silver alginate changed to [facility] on the left leg today she has home health 04/13/17; still no open area on her right leg. Her remaining wound on the left anterior leg. We have been using using Hydrofera Blue 04/20/17; no open area on the right leg although the edema here is somewhat disfiguring. Her remaining wound is on the left anterior leg we've been using Hydrofera Blue with improvement in dimensions. She has [facility] home health and lives in [place]. She requests to come every 2 weeks because of the distance involved in coming here 05/04/17; there is no open area on the right leg. She has a very small left anterior leg wound that remains. However what remains on the left still has some depth and a not to viable looking surface. It is too small to really attempt debridement. She has home health coming out to her home in [facility] 05/18/17; there is no open area on either leg. She has significant chronic venous insufficiency. The left anterior leg wound that was still open 2 weeks ago has closed. She has home health coming out. We are going to order Juxtalite stockings for both legs. She is agreed to pay for these privately. We will order  them from [facility] READMISSION 04/14/2019 This is a patient we previously had in this clinic in 2019 for 3 months. She had a wound on the left anterior greater than right anterior leg. According to my notes we discharged her and juxta lite stockings although it does not appear that she ever got them. The history here is a bit difficult. She apparently has had a wound on the left lower leg for the last 6 months. She required admission to hospital in [facility] for apparent cellulitis just before Christmas was discharged home she has an open wound. She has a home health agency although we are not exactly sure which one. They are applying silver alginate. No compression Past medical history is reviewed she has chronic venous insufficiency and a history of left breast CA. 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 She did not have any compression on the wounds today. 2/8; substantial wound on the right posterior calf and a smaller area on the left medial calf. We are using Iodoflex to both wound areas under compression. We have better edema control We to find a new wound on the left anterior medial calf 2/15; the area on the left medial calf is closed over. Substantial area on the right posterior and  lateral calf. We have been using Iodoflex although the patient complains of pain and wonders if there is not an alternative. She has home health changing the dressing twice a week and she sees [place] once 2/22; the wound on the left medial calf is closed over. Her edema control is not good here but she cannot get on stockings. This substantial wound is on the right lateral and right posterior calf. Changed her to a [employer] last week. Gradual improvement in wound area. Objective Constitutional Sitting or standing Blood Pressure is within target range for patient.. Pulse regular  and within target range for patient.Marland Kitchen Respirations regular, non-labored and within target range.. Temperature is normal and within the target range for the patient.Marland Kitchen Appears in no distress. Vitals Time Taken: 3:45 PM, Height: 60 in, Weight: 130 lbs, BMI: 25.4, Temperature: 98.2 F, Pulse: 88 bpm, Respiratory Rate: 18 breaths/min, Blood Pressure: 128/91 mmHg. Cardiovascular Pedal pulses are palpable. Edema control is good. Psychiatric appears at normal baseline. General Notes: Wound exam; the wound is really made a lot of improvement in terms of the condition of the wound bed. No mechanical debridement was necessary today which is and is a major improvement in itself. The wound was debrided with Anasept and gauze. Even under illumination the surface looks quite good Integumentary (Hair, Skin) No erythema around the wound and no signs of infection. Wound #6 status is Open. Original cause of wound was Gradually Appeared. The wound is located on the Right Lower Leg. The wound measures 9.2cm length x 14.8cm width x 0.1cm depth; 106.94cm^2 area and 10.694cm^3 volume. There is Fat Layer (Subcutaneous Tissue) Exposed exposed. There is no tunneling or undermining noted. There is a large amount of serosanguineous drainage noted. The wound margin is well defined and not attached to the wound base. There is medium (34-66%) red, pink granulation within the wound bed. There is a small (1-33%) amount of necrotic tissue within the wound bed including Adherent Slough. 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  There was a Three Layer Compression Therapy Procedure by [name], RN. Post procedure Diagnosis Wound #6: Same as Pre-Procedure Plan Follow-up Appointments: Return Appointment in 2 weeks. Dressing Change Frequency: Wound #6 Right Lower Leg: Change dressing three times week. - by home health. Home health to change 2x a week on week that patient has appt at wound clinic. Skin Barriers/Peri-Wound Care: Barrier cream Moisturizing lotion Wound Cleansing: May shower with protection. - use cast protector Primary Wound Dressing: Wound #6 Right Lower Leg: Hydrofera Blue Secondary Dressing: Wound #6 Right Lower Leg: ABD pad Zetuvit or Kerramax - or Xtrasorb (or other superabsorbent dressing) Edema Control: 3 Layer Compression System - Right Lower Extremity Avoid standing for long periods of time Elevate legs to the level of the heart or above for 30 minutes daily and/or when sitting, a frequency of: - throughout the day Home Health: [place] skilled nursing for wound care. - [facility] 1. The patient is making slow but steady progress 2. Continue with Hydrofera Blue/ABDs under 3 layer compression 3. I explained to her that we will be monitoring her in this clinic for perhaps 2  to 3 months. She has home health and I think we can put her out 2 weeks as she seems to be on the right trajectory. Electronic Signature(s) Signed: 05/05/2019 5:55:43 PM By: Linton Ham MD Entered By: Linton Ham on 05/05/2019 17:44:05 -------------------------------------------------------------------------------- SuperBill Details Patient Name: Date of Service: Erin [name] 05/05/2019 Medical Record [id number] Patient Account Number: [id number] Date of Birth/Sex: Treating RN: 05/25/1930 (86 y.o. [name] Primary Care Provider: [name] Other Clinician: Referring Provider: Treating Provider/Extender:Delmo Matty, [name], [name] Weeks in Treatment:  3 Diagnosis Coding ICD-10 Codes Code Description I87.331 Chronic venous hypertension (idiopathic) with ulcer and inflammation of right lower extremity L97.812 Non-pressure chronic ulcer of other part of right lower leg with fat layer exposed I89.0 Lymphedema, not elsewhere classified L97.821 Non-pressure chronic ulcer of other part of left lower leg limited to breakdown of skin Facility Procedures CPT4 Code Description: [id number] (Facility Use Only) [id number] - APPLY MULTLAY COMPRS LWR RT LEG Modifier: Quantity: 1 Physician Procedures CPT4 Code Description: [id number] - WC PHYS LEVEL 3 - EST PT ICD-10 Diagnosis Description H00.164 Non-pressure chronic ulcer of other part of right lower le I87.331 Chronic venous hypertension (idiopathic) with ulcer and in lower extremity I89.0  Lymphedema, not elsewhere classified Modifier: g with fat layer flammation of ri Quantity: 1 exposed ght Electronic Signature(s) Signed: 05/06/2019 7:48:35 AM By: Linton Ham MD Signed: 05/08/2019 8:56:01 AM By: [name] RN, BSN Previous Signature: 05/05/2019 5:55:43 PM Version By: Linton Ham MD Entered By: [name] on 05/05/2019 18:38:03

## 2019-05-12 ENCOUNTER — Encounter (HOSPITAL_BASED_OUTPATIENT_CLINIC_OR_DEPARTMENT_OTHER): Payer: Medicare HMO | Admitting: Internal Medicine

## 2019-05-19 ENCOUNTER — Encounter (HOSPITAL_BASED_OUTPATIENT_CLINIC_OR_DEPARTMENT_OTHER): Payer: Medicare HMO | Attending: Internal Medicine | Admitting: Internal Medicine

## 2019-05-19 ENCOUNTER — Other Ambulatory Visit: Payer: Self-pay

## 2019-05-19 DIAGNOSIS — L97821 Non-pressure chronic ulcer of other part of left lower leg limited to breakdown of skin: Secondary | ICD-10-CM | POA: Insufficient documentation

## 2019-05-19 DIAGNOSIS — Z8614 Personal history of Methicillin resistant Staphylococcus aureus infection: Secondary | ICD-10-CM | POA: Diagnosis not present

## 2019-05-19 DIAGNOSIS — L97812 Non-pressure chronic ulcer of other part of right lower leg with fat layer exposed: Secondary | ICD-10-CM | POA: Diagnosis not present

## 2019-05-19 DIAGNOSIS — I87331 Chronic venous hypertension (idiopathic) with ulcer and inflammation of right lower extremity: Secondary | ICD-10-CM | POA: Insufficient documentation

## 2019-05-19 DIAGNOSIS — I10 Essential (primary) hypertension: Secondary | ICD-10-CM | POA: Diagnosis not present

## 2019-05-19 DIAGNOSIS — Z853 Personal history of malignant neoplasm of breast: Secondary | ICD-10-CM | POA: Diagnosis not present

## 2019-05-19 DIAGNOSIS — I890 Lymphedema, not elsewhere classified: Secondary | ICD-10-CM | POA: Diagnosis not present

## 2019-05-19 DIAGNOSIS — I872 Venous insufficiency (chronic) (peripheral): Secondary | ICD-10-CM | POA: Diagnosis not present

## 2019-05-19 NOTE — Progress Notes (Addendum)
Erin [name], GEESLIN ([id number]) Visit Report for 05/19/2019 Arrival Information Details Patient Name: Date of Service: Erin [name], SINGLETERRY 05/19/2019 3:15 PM Medical Record [id number] Patient Account Number: [id number] Date of Birth/Sex: Treating RN: [birth year] (86 y.o. [name] Primary Care Kregg Cihlar: [name] Other Clinician: Referring Valma Rotenberg: Treating Abigayl Hor/Extender:Robson, [name], [name] in Treatment: 5 Visit Information History Since Last Visit Added or deleted any medications: No Patient Arrived: Wheel Chair Any new allergies or adverse reactions: No Arrival Time: 15:35 Had a fall or experienced change in No Accompanied By: cargiver activities of daily living that may affect Transfer Assistance: None risk of falls: Patient Identification Verified: Yes Signs or symptoms of abuse/neglect since last No Secondary Verification Process Yes visito Completed: Hospitalized since last visit: No Patient Requires Transmission- No Implantable device outside of the clinic excluding No Based Precautions: cellular tissue based products placed in the center Patient Has Alerts: Yes since last visit: Patient Alerts: NON COMPRESSABLE Has Dressing in Place as Prescribed: Yes Has Compression in Place as Prescribed: Yes Pain Present Now: No Electronic Signature(s) Signed: 05/19/2019 5:42:31 PM By: Deon Pilling Entered By: Deon Pilling on 05/19/2019 15:48:49 -------------------------------------------------------------------------------- Compression Therapy Details Patient Name: Date of Service: Erin [name] 05/19/2019 3:15 PM Medical Record [id number] Patient Account Number: [id number] Date of Birth/Sex: Treating RN: 1930/06/14 (86 y.o. [name] Primary Care Raylea Adcox: [name] Other Clinician: Referring Aydien Majette: Treating Mykia Holton/Extender:Robson, [name], [name] Weeks in Treatment: 5 Compression Therapy  Performed for Wound Wound #6 Right Lower Leg Assessment: Performed By: Clinician [name], RN Compression Type: Three Layer Post Procedure Diagnosis Same as Pre-procedure Electronic Signature(s) Signed: 05/19/2019 5:59:01 PM By: [name] RN, BSN Entered By: [name] on 05/19/2019 16:33:05 -------------------------------------------------------------------------------- Encounter Discharge Information Details Patient Name: Date of Service: Erin [name] 05/19/2019 3:15 PM Medical Record [id number] Patient Account Number: [id number] Date of Birth/Sex: Treating RN: Jun 11, 1930 (86 y.o. [name] Primary Care Maddyx Wieck: [name] Other Clinician: Referring Shaniah Baltes: Treating Keyanni Whittinghill/Extender:Robson, [name], [name] in Treatment: 5 Encounter Discharge Information Items Discharge Condition: Stable Ambulatory Status: Wheelchair Discharge Destination: Home Transportation: Private Auto Accompanied By: caregiver Schedule Follow-up Appointment: Yes Clinical Summary of Care: Patient Declined Electronic Signature(s) Signed: 05/19/2019 5:31:36 PM By: Kela Millin Entered By: Kela Millin on 05/19/2019 17:18:37 -------------------------------------------------------------------------------- Lower Extremity Assessment Details Patient Name: Date of Service: Erin, [name] 05/19/2019 3:15 PM Medical Record [id number] Patient Account Number: [id number] Date of Birth/Sex: Treating RN: Dec 27, 1930 (86 y.o. [name] Primary Care Raevin Wierenga: [name] Other Clinician: Referring Eryx Zane: Treating Lexani Corona/Extender:Robson, [name], [name] Weeks in Treatment: 5 Edema Assessment Assessed: [Left: No] [Right: Yes] Edema: [Left: Ye] [Right: s] Calf Left: Right: Point of Measurement: 35 cm From Medial Instep cm 29 cm Ankle Left: Right: Point of Measurement: 7 cm From Medial Instep cm 22 cm Vascular  Assessment Pulses: Dorsalis Pedis Palpable: [Right:Yes] Electronic Signature(s) Signed: 05/19/2019 5:42:31 PM By: Deon Pilling Entered By: Deon Pilling on 05/19/2019 15:49:22 -------------------------------------------------------------------------------- Multi Wound Chart Details Patient Name: Date of Service: Erin [name] 05/19/2019 3:15 PM Medical Record [id number] Patient Account Number: [id number] Date of Birth/Sex: Treating RN: 03-Aug-1930 (86 y.o. [name] Primary Care Tajae Maiolo: [name] Other Clinician: Referring Dodie Parisi: Treating Sidney Silberman/Extender:Robson, [name], [name] Weeks in Treatment: 5 Vital Signs Height(in): 60 Pulse(bpm): 70 Weight(lbs): 130 Blood Pressure(mmHg): 162/49 Body Mass Index(BMI): 25 Temperature(F): 98 Respiratory 18 Rate(breaths/min): Photos: [6:No Photos] [N/A:N/A] Wound Location: [6:Right Lower Leg] [N/A:N/A] Wounding Event: [6:Gradually Appeared] [N/A:N/A] Primary Etiology: [6:Lymphedema] [N/A:N/A] Comorbid  History: [6:Cataracts, Glaucoma, Middle ear problems, Anemia, Hypertension] [N/A:N/A] Date Acquired: [6:03/14/2019] [N/A:N/A] Weeks of Treatment: [6:5] [N/A:N/A] Wound Status: [6:Open] [N/A:N/A] Measurements L x W x D 9.3x14x0.1 [N/A:N/A] (cm) Area (cm) : [6:102.259] [N/A:N/A] Volume (cm) : [6:10.226] [N/A:N/A] % Reduction in Area: [6:23.90%] [N/A:N/A] % Reduction in Volume: 81.00% [N/A:N/A] Classification: [6:Full Thickness Without Exposed Support Structures] [N/A:N/A] Exudate Amount: [6:Large] [N/A:N/A] Exudate Type: [6:Serosanguineous] [N/A:N/A] Exudate Color: [6:red, brown] [N/A:N/A] Wound Margin: [6:Well defined, not attached N/A] Granulation Amount: [6:Medium (34-66%)] [N/A:N/A] Granulation Quality: [6:Red, Pink] [N/A:N/A] Necrotic Amount: [6:Medium (34-66%)] [N/A:N/A] Exposed Structures: [6:Fat Layer (Subcutaneous N/A Tissue) Exposed: Yes Fascia: No Tendon: No Muscle: No Joint: No Bone:  No] Epithelialization: [6:Small (1-33%) Compression Therapy] [N/A:N/A N/A] Treatment Notes Electronic Signature(s) Signed: 05/19/2019 5:29:15 PM By: Linton Ham MD Signed: 05/19/2019 5:59:01 PM By: [name] RN, BSN Entered By: Linton Ham on 05/19/2019 17:04:45 -------------------------------------------------------------------------------- Multi-Disciplinary Care Plan Details Patient Name: Date of Service: Erin Carbon A. 05/19/2019 3:15 PM Medical Record [id number] Patient Account Number: [id number] Date of Birth/Sex: Treating RN: 08-10-1930 (86 y.o. [name] Primary Care Dixon Luczak: [name] Other Clinician: Referring Darlette Dubow: Treating Timothy Trudell/Extender:Robson, [name], [name] Weeks in Treatment: 5 Active Inactive Wound/Skin Impairment Nursing Diagnoses: Impaired tissue integrity Knowledge deficit related to ulceration/compromised skin integrity Goals: Patient/caregiver will verbalize understanding of skin care regimen Date Initiated: 04/14/2019 Target Resolution Date: 06/20/2019 Goal Status: Active Ulcer/skin breakdown will have a volume reduction of 30% by week 4 Date Initiated: 04/14/2019 Date Inactivated: 05/19/2019 Target Resolution Date: 05/16/2019 Goal Status: Met Ulcer/skin breakdown will have a volume reduction of 50% by week 8 Date Initiated: 05/19/2019 Target Resolution Date: 06/20/2019 Goal Status: Active Interventions: Assess patient/caregiver ability to obtain necessary supplies Assess patient/caregiver ability to perform ulcer/skin care regimen upon admission and as needed Assess ulceration(s) every visit Provide education on ulcer and skin care Notes: Electronic Signature(s) Signed: 05/19/2019 5:59:01 PM By: [name] RN, BSN Entered By: [name] on 05/19/2019 17:50:02 -------------------------------------------------------------------------------- Pain Assessment Details Patient Name: Date of Service: Erin [name] 05/19/2019 3:15 PM Medical Record [id number] Patient Account Number: [id number] Date of Birth/Sex: Treating RN: 18-Apr-1930 (86 y.o. [name] Primary Care Lieutenant Abarca: [name] Other Clinician: Referring Khira Cudmore: Treating Indi Willhite/Extender:Robson, [name], [name] Weeks in Treatment: 5 Active Problems Location of Pain Severity and Description of Pain Patient Has Paino No Site Locations Rate the pain. Current Pain Level: 0 Pain Management and Medication Current Pain Management: Medication: No Cold Application: No Rest: No Massage: No Activity: No T.E.N.S.: No Heat Application: No Leg drop or elevation: No Is the Current Pain Management Adequate: Adequate How does your wound impact your activities of daily livingo Sleep: No Bathing: No Appetite: No Relationship With Others: No Bladder Continence: No Emotions: No Bowel Continence: No Work: No Toileting: No Drive: No Dressing: No Hobbies: No Electronic Signature(s) Signed: 05/19/2019 5:42:31 PM By: Deon Pilling Entered By: Deon Pilling on 05/19/2019 15:49:11 -------------------------------------------------------------------------------- Patient/Caregiver Education Details Patient Name: Date of Service: Erin [name] 3/8/2021andnbsp3:15 PM Medical Record [id number] Patient Account Number: [id number] Date of Birth/Gender: Treating RN: 1931/02/21 (86 y.o. [name] Primary Care Physician: [name] Other Clinician: Referring Physician: Treating Physician/Extender:Robson, [name], [name] in Treatment: 5 Education Assessment Education Provided To: Patient Education Topics Provided Wound/Skin Impairment: Methods: Explain/Verbal Responses: State content correctly Electronic Signature(s) Signed: 05/19/2019 5:59:01 PM By: [name] RN, BSN Entered By: [name] on 05/19/2019  17:50:12 -------------------------------------------------------------------------------- Wound Assessment Details Patient Name: Date of Service: Erin Carbon A. 05/19/2019 3:15  PM Medical Record [id number] Patient Account Number: [id number] Date of Birth/Sex: Treating RN: Oct 27, 1930 (86 y.o. [name] Primary Care Zaryah Seckel: [name] Other Clinician: Referring Alandria Butkiewicz: Treating Shuayb Schepers/Extender:Robson, [name], [name] Weeks in Treatment: 5 Wound Status Wound Number: 6 Primary Lymphedema Etiology: Wound Location: Right Lower Leg Wound Open Wounding Event: Gradually Appeared Status: Date Acquired: 03/14/2019 Comorbid Cataracts, Glaucoma, Middle ear problems, Weeks Of Treatment: 5 History: Anemia, Hypertension Clustered Wound: No Photos Wound Measurements Length: (cm) 9.3 % Reduct Width: (cm) 14 % Reduct Depth: (cm) 0.1 Epitheli Area: (cm) 102.259 Tunneli Volume: (cm) 10.226 Undermi Wound Description Classification: Full Thickness Without Exposed Support Foul Odo Structures Slough/F Wound Well defined, not attached Margin: Exudate Large Amount: Exudate Serosanguineous Type: Exudate red, brown Color: Wound Bed Granulation Amount: Medium (34-66%) Granulation Quality: Red, Pink Fascia E Necrotic Amount: Medium (34-66%) Fat Laye Necrotic Quality: Adherent Slough Tendon E Muscle E Joint Ex Bone Exp r After Cleansing: No ibrino Yes Exposed Structure xposed: No r (Subcutaneous Tissue) Exposed: Yes xposed: No xposed: No posed: No osed: No ion in Area: 23.9% ion in Volume: 81% alization: Small (1-33%) ng: No ning: No Electronic Signature(s) Signed: 05/21/2019 2:27:25 PM By: Mikeal Hawthorne EMT/HBOT Signed: 06/25/2019 9:04:28 AM By: [name] RN, BSN Previous Signature: 05/19/2019 5:42:31 PM Version By: Deon Pilling Entered By: Mikeal Hawthorne on 05/21/2019  13:41:11 -------------------------------------------------------------------------------- Vitals Details Patient Name: Date of Service: Erin Carbon A. 05/19/2019 3:15 PM Medical Record [id number] Patient Account Number: [id number] Date of Birth/Sex: Treating RN: [birth year] (86 y.o. [name], [name] Primary Care Mychal Decarlo: [name] Other Clinician: Referring Dierdre Mccalip: Treating Denene Alamillo/Extender:Robson, [name], [name] Weeks in Treatment: 5 Vital Signs Time Taken: 15:35 Temperature (F): 98 Height (in): 60 Pulse (bpm): 70 Weight (lbs): 130 Respiratory Rate (breaths/min): 18 Body Mass Index (BMI): 25.4 Blood Pressure (mmHg): 162/49 Reference Range: 80 - 120 mg / dl Electronic Signature(s) Signed: 05/19/2019 5:42:31 PM By: Deon Pilling Entered By: Deon Pilling on 05/19/2019 15:49:03

## 2019-05-20 NOTE — Progress Notes (Signed)
Erin [name] ([id number]) Visit Report for 05/19/2019 HPI Details Patient Name: Date of Service: Erin [name], Erin [name] 05/19/2019 3:15 PM Medical Record [id number] Patient Account Number: [id number] Date of Birth/Sex: Treating RN: 1930/09/12 (86 y.o. Erin [name] Primary Care Provider: Arsenio [name] Other Clinician: Referring Provider: Treating Provider/Extender:Erin [name], Erin [name], Erin [name] in Treatment: 5 History of Present Illness HPI Description: We to find a new wound on the left 03/22/17; this is an 86 year old woman. There is not a lot of information in care everywhere on this patient. She had a history of a malignant neoplasm her left breast for which she had lumpectomy but she did not have radiation. Apparently she has had long-standing edema and her lower legs and she was seen in the wound care center at [facility] in [place] by [name] October. Both her legs were wrapped however it sounds as though the left leg became secondarily infected she apparently had MRSA and she was admitted the hospital. Not really turn for wound care. She lives at home on her own and has apparently [facility] home health care. We are not exactly clear what [facility] is doing to her legs but it does not involve compression. She is not a diabetic. ABIs in our clinic were noncompressible bilaterally 03/30/17; this is a patient who has bilateral chronic venous insufficiency, severe venous inflammation. We had a nice description number legs from a note by [name] dating 2010 describing edematous warm legs with erythema. This suggests that she has chronic venous insufficiency with inflammation.. We have arterial studies dated 12/29/16 again from [facility]. This showed a right ABI at 1 and a TBI of 0.81 on the left the ABI was 1.25 and a TBI of 0.72. This was just she does not have significant arterial insufficiency. She had normal triphasic waveforms noted that she was  admitted to hospital in late October with MSSA cellulitis Last visit we wrapped both her legs. She had an open area predominantly on the left anterior but weeping edema on the right anterior leg. She has done well there is only one at anterior left tibial wound 04/06/17; the patient has no open area on her right leg. Her remaining wound is on the left anterior leg, the left posterior leg wound is healed. We have been using silver alginate changed to [facility] on the left leg today she has home health 04/13/17; still no open area on her right leg. Her remaining wound on the left anterior leg. We have been using using Hydrofera Blue 04/20/17; no open area on the right leg although the edema here is somewhat disfiguring. Her remaining wound is on the left anterior leg we've been using Hydrofera Blue with improvement in dimensions. She has [facility] home health and lives in [place]. She requests to come every 2 [name] because of the distance involved in coming here 05/04/17; there is no open area on the right leg. She has a very small left anterior leg wound that remains. However what remains on the left still has some depth and a not to viable looking surface. It is too small to really attempt debridement. She has home health coming out to her home in [facility] 05/18/17; there is no open area on either leg. She has significant chronic venous insufficiency. The left anterior leg wound that was still open 2 [name] ago has closed. She has home health coming out. We are going to order Juxtalite stockings for both legs. She is agreed to pay for  these privately. We will order them from [facility] READMISSION 04/14/2019 This is a patient we previously had in this clinic in 2019 for 3 months. She had a wound on the left anterior greater than right anterior leg. According to my notes we discharged her and juxta lite stockings although it does not appear that she ever got them. The history here is a bit  difficult. She apparently has had a wound on the left lower leg for the last 6 months. She required admission to hospital in [facility] for apparent cellulitis just before Christmas was discharged home she has an open wound. She has a home health agency although we are not exactly sure which one. They are applying silver alginate. No compression Past medical history is reviewed she has chronic venous insufficiency and a history of left breast CA. 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 She did not have any compression on the wounds today. 2/8; substantial wound on the right posterior calf and a smaller area on the left medial calf. We are using Iodoflex to both wound areas under compression. We have better edema control We to find a new wound on the left anterior medial calf 2/15; the area on the left medial calf is closed over. Substantial area on the right posterior and lateral calf. We have been using Iodoflex although the patient complains of pain and wonders if there is not an alternative. She has home health changing the dressing twice a week and she sees [place] once 2/22; the wound on the left medial calf is closed over. Her edema control is not good here but she cannot get on stockings. This substantial wound is on the right lateral and right posterior calf. Changed her to a hydrofera Blue last week. Gradual improvement in wound area. 3/8; wound on the right left leg remains closed. The substantial wound is on the right lateral and right posterior calf. This is measuring smaller Electronic Signature(s) Signed: 05/19/2019 5:29:15 PM By: Linton Ham MD Entered By: Linton Ham on 05/19/2019 17:05:28 -------------------------------------------------------------------------------- Physical Exam Details Patient Name: Date of Service: Erin [name]  05/19/2019 3:15 PM Medical Record [id number] Patient Account Number: [id number] Date of Birth/Sex: Treating RN: [birth year] (86 y.o. Erin [name] Primary Care Provider: Arsenio [name] Other Clinician: Referring Provider: Treating Provider/Extender:Terecia Plaut, Erin [name], Erin [name] in Treatment: 5 Constitutional Patient is hypertensive.. Pulse regular and within target range for patient.Marland Kitchen Respirations regular, non-labored and within target range.. Temperature is normal and within the target range for the patient.Marland Kitchen Appears in no distress. Cardiovascular Pedal pulses are palpable. Edema is under good control. Integumentary (Hair, Skin) Changes of chronic venous insufficiency no systemic skin issues. Notes Wound exam; the condition of the wound bed continues to be improved. No mechanical debridement is necessary. Edema control is good. Debrided with Anasept and gauze Electronic Signature(s) Signed: 05/19/2019 5:29:15 PM By: Linton Ham MD Entered By: Linton Ham on 05/19/2019 17:07:02 -------------------------------------------------------------------------------- Physician Orders Details Patient Name: Date of Service: Erin [name] 05/19/2019 3:15 PM Medical Record [id number] Patient Account Number: [id number] Date of Birth/Sex: Treating RN: 1930/05/17 (86 y.o. Erin [name] Primary Care Provider: Arsenio [name] Other Clinician: Referring Provider: Treating Provider/Extender:Anjelita Sheahan, Erin [name], Erin [name] in Treatment: 5 Verbal / Phone Orders: No Diagnosis Coding ICD-10 Coding Code Description I87.331 Chronic venous hypertension (idiopathic) with ulcer and inflammation of right lower extremity  T70.017 Non-pressure chronic ulcer of other part of right lower leg with fat layer exposed I89.0 Lymphedema, not elsewhere classified L97.821 Non-pressure chronic ulcer of other part of left lower leg limited to breakdown of skin Follow-up  Appointments Return Appointment in 2 [name]. Dressing Change Frequency Wound #6 Right Lower Leg Change dressing three times week. - by home health. Home health to change 2x a week on week that patient has appt at wound clinic. Skin Barriers/Peri-Wound Care Barrier cream Moisturizing lotion Wound Cleansing May shower with protection. - use cast protector Primary Wound Dressing Wound #6 Right Lower Leg Hydrofera Blue - may apply adaptic under Hydrofera to prevent from sticking Secondary Dressing Wound #6 Right Lower Leg ABD pad Zetuvit or Kerramax - or Xtrasorb (or other superabsorbent dressing) Edema Control 3 Layer Compression System - Right Lower Extremity Avoid standing for long periods of time Elevate legs to the level of the heart or above for 30 minutes daily and/or when sitting, a frequency of: - throughout the day [facility] skilled nursing for wound care. - [facility] Electronic Signature(s) Signed: 05/19/2019 5:29:15 PM By: Linton Ham MD Signed: 05/19/2019 5:59:01 PM By: [name] RN, BSN Entered By: [name] on 05/19/2019 16:32:04 -------------------------------------------------------------------------------- Problem List Details Patient Name: Date of Service: Erin [name] 05/19/2019 3:15 PM Medical Record [id number] Patient Account Number: [id number] Date of Birth/Sex: Treating RN: [birth year] (86 y.o. Erin [name] Primary Care Provider: Arsenio [name] Other Clinician: Referring Provider: Treating Provider/Extender:Ellias Mcelreath, Erin [name], Erin [name] in Treatment: 5 Active Problems ICD-10 Evaluated Encounter Code Description Active Date Today Diagnosis I87.331 Chronic venous hypertension (idiopathic) with ulcer 04/14/2019 No Yes and inflammation of right lower extremity L97.812 Non-pressure chronic ulcer of other part of right lower 04/14/2019 No Yes leg with fat layer exposed I89.0 Lymphedema, not elsewhere  classified 04/14/2019 No Yes L97.821 Non-pressure chronic ulcer of other part of left lower 04/21/2019 No Yes leg limited to breakdown of skin Inactive Problems Resolved Problems Electronic Signature(s) Signed: 05/19/2019 5:29:15 PM By: Linton Ham MD Entered By: Linton Ham on 05/19/2019 17:04:39 -------------------------------------------------------------------------------- Progress Note Details Patient Name: Date of Service: Erin [name] 05/19/2019 3:15 PM Medical Record [id number] Patient Account Number: [id number] Date of Birth/Sex: Treating RN: Feb 24, 1931 (86 y.o. Erin [name] Primary Care Provider: Arsenio [name] Other Clinician: Referring Provider: Treating Provider/Extender:Donnamarie Shankles, Erin [name], Erin [name] in Treatment: 5 Subjective History of Present Illness (HPI) We to find a new wound on the left 03/22/17; this is an 86 year old woman. There is not a lot of information in care everywhere on this patient. She had a history of a malignant neoplasm her left breast for which she had lumpectomy but she did not have radiation. Apparently she has had long-standing edema and her lower legs and she was seen in the wound care center at [facility] in [place] by [name] October. Both her legs were wrapped however it sounds as though the left leg became secondarily infected she apparently had MRSA and she was admitted the hospital. Not really turn for wound care. She lives at home on her own and has apparently [facility] home health care. We are not exactly clear what [facility] is doing to her legs but it does not involve compression. She is not a diabetic. ABIs in our clinic were noncompressible bilaterally 03/30/17; this is a patient who has bilateral chronic venous insufficiency, severe venous inflammation. We had a nice description number legs from a note by [name] dating 2010 describing edematous warm  legs with erythema. This suggests that she has  chronic venous insufficiency with inflammation.. We have arterial studies dated 12/29/16 again from [facility]. This showed a right ABI at 1 and a TBI of 0.81 on the left the ABI was 1.25 and a TBI of 0.72. This was just she does not have significant arterial insufficiency. She had normal triphasic waveforms noted that she was admitted to hospital in late October with MSSA cellulitis Last visit we wrapped both her legs. She had an open area predominantly on the left anterior but weeping edema on the right anterior leg. She has done well there is only one at anterior left tibial wound 04/06/17; the patient has no open area on her right leg. Her remaining wound is on the left anterior leg, the left posterior leg wound is healed. We have been using silver alginate changed to [facility] on the left leg today she has home health 04/13/17; still no open area on her right leg. Her remaining wound on the left anterior leg. We have been using using Hydrofera Blue 04/20/17; no open area on the right leg although the edema here is somewhat disfiguring. Her remaining wound is on the left anterior leg we've been using Hydrofera Blue with improvement in dimensions. She has [facility] home health and lives in [place]. She requests to come every 2 [name] because of the distance involved in coming here 05/04/17; there is no open area on the right leg. She has a very small left anterior leg wound that remains. However what remains on the left still has some depth and a not to viable looking surface. It is too small to really attempt debridement. She has home health coming out to her home in [facility] 05/18/17; there is no open area on either leg. She has significant chronic venous insufficiency. The left anterior leg wound that was still open 2 [name] ago has closed. She has home health coming out. We are going to order Juxtalite stockings for both legs. She is agreed to pay for these privately. We will  order them from [facility] READMISSION 04/14/2019 This is a patient we previously had in this clinic in 2019 for 3 months. She had a wound on the left anterior greater than right anterior leg. According to my notes we discharged her and juxta lite stockings although it does not appear that she ever got them. The history here is a bit difficult. She apparently has had a wound on the left lower leg for the last 6 months. She required admission to hospital in [facility] for apparent cellulitis just before Christmas was discharged home she has an open wound. She has a home health agency although we are not exactly sure which one. They are applying silver alginate. No compression Past medical history is reviewed she has chronic venous insufficiency and a history of left breast CA. 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 She did not have any compression on the wounds today. 2/8; substantial wound on the right posterior calf and a smaller area on the left medial calf. We are using Iodoflex to both wound areas under compression. We have better edema control We to find a new wound on the left anterior medial calf 2/15; the area on the left medial calf is closed over. Substantial area on the right posterior and lateral calf. We  have been using Iodoflex although the patient complains of pain and wonders if there is not an alternative. She has home health changing the dressing twice a week and she sees [place] once 2/22; the wound on the left medial calf is closed over. Her edema control is not good here but she cannot get on stockings. This substantial wound is on the right lateral and right posterior calf. Changed her to a hydrofera Blue last week. Gradual improvement in wound area. 3/8; wound on the right left leg remains closed. The substantial wound is on the right lateral and right  posterior calf. This is measuring smaller Objective Constitutional Patient is hypertensive.. Pulse regular and within target range for patient.Marland Kitchen Respirations regular, non-labored and within target range.. Temperature is normal and within the target range for the patient.Marland Kitchen Appears in no distress. Vitals Time Taken: 3:35 PM, Height: 60 in, Weight: 130 lbs, BMI: 25.4, Temperature: 98 F, Pulse: 70 bpm, Respiratory Rate: 18 breaths/min, Blood Pressure: 162/49 mmHg. Cardiovascular Pedal pulses are palpable. Edema is under good control. General Notes: Wound exam; the condition of the wound bed continues to be improved. No mechanical debridement is necessary. Edema control is good. Debrided with Anasept and gauze Integumentary (Hair, Skin) Changes of chronic venous insufficiency no systemic skin issues. Wound #6 status is Open. Original cause of wound was Gradually Appeared. The wound is located on the Right Lower Leg. The wound measures 9.3cm length x 14cm width x 0.1cm depth; 102.259cm^2 area and 10.226cm^3 volume. There is Fat Layer (Subcutaneous Tissue) Exposed exposed. There is no tunneling or undermining noted. There is a large amount of serosanguineous drainage noted. The wound margin is well defined and not attached to the wound base. There is medium (34-66%) red, pink granulation within the wound bed. There is a medium (34-66%) amount of necrotic tissue within the wound bed including Adherent Slough. 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 There was a Three Layer Compression Therapy  Procedure by [name], RN. Post procedure Diagnosis Wound #6: Same as Pre-Procedure Plan Follow-up Appointments: Return Appointment in 2 [name]. Dressing Change Frequency: Wound #6 Right Lower Leg: Change dressing three times week. - by home health. Home health to change 2x a week on week that patient has appt at wound clinic. Skin Barriers/Peri-Wound Care: Barrier cream Moisturizing lotion Wound Cleansing: May shower with protection. - use cast protector Primary Wound Dressing: Wound #6 Right Lower Leg: Hydrofera Blue - may apply adaptic under Hydrofera to prevent from sticking Secondary Dressing: Wound #6 Right Lower Leg: ABD pad Zetuvit or Kerramax - or Xtrasorb (or other superabsorbent dressing) Edema Control: 3 Layer Compression System - Right Lower Extremity Avoid standing for long periods of time Elevate legs to the level of the heart or above for 30 minutes daily and/or when sitting, a frequency of: - throughout the day Home Health: [facility] skilled [facility] for wound care. - [facility] 1. Hydrofera Blue. May put Adaptic as a contact layer to prevent from sticking 2. Still 3 layer compression we have home health at the change Electronic Signature(s) Signed: 05/19/2019 5:29:15 PM By: Linton Ham MD Entered By: Linton Ham on  05/19/2019 17:07:56 -------------------------------------------------------------------------------- SuperBill Details Patient Name: Date of Service: RANISHA, ALLAIRE 05/19/2019 Medical Record [id number] Patient Account Number: [id number] Date of Birth/Sex: Treating RN: 07/30/1930 (86 y.o. Erin [name] Primary Care Provider: Arsenio [name] Other Clinician: Referring Provider: Treating Provider/Extender:Jocee Kissick, Erin [name], Erin [name] in Treatment: 5 Diagnosis Coding ICD-10 Codes Code Description I87.331 Chronic venous hypertension (idiopathic) with ulcer and inflammation of right lower extremity L97.812  Non-pressure chronic ulcer of other part of right lower leg with fat layer exposed I89.0 Lymphedema, not elsewhere classified L97.821 Non-pressure chronic ulcer of other part of left lower leg limited to breakdown of skin Facility Procedures CPT4 Code Description: [id number] (Facility Use Only) [phone] - APPLY [facility] RT LEG Modifier: Quantity: 1 Physician Procedures CPT4 Code Description: [id number] - WC PHYS LEVEL 3 - EST PT ICD-10 Diagnosis Description I87.331 Chronic venous hypertension (idiopathic) with ulcer and in lower extremity L97.812 Non-pressure chronic ulcer of other part of right lower le I89.0  Lymphedema, not elsewhere classified Modifier: flammation of ri g with fat layer Quantity: 1 ght exposed Electronic Signature(s) Signed: 05/19/2019 5:59:01 PM By: [name] RN, BSN Signed: 05/20/2019 5:48:31 PM By: Linton Ham MD Previous Signature: 05/19/2019 5:29:15 PM Version By: Linton Ham MD Entered By: [name] on 05/19/2019 17:50:33

## 2019-06-02 ENCOUNTER — Other Ambulatory Visit: Payer: Self-pay

## 2019-06-02 ENCOUNTER — Encounter (HOSPITAL_BASED_OUTPATIENT_CLINIC_OR_DEPARTMENT_OTHER): Payer: Medicare HMO | Admitting: Internal Medicine

## 2019-06-02 DIAGNOSIS — I872 Venous insufficiency (chronic) (peripheral): Secondary | ICD-10-CM | POA: Diagnosis not present

## 2019-06-02 NOTE — Progress Notes (Signed)
Erin [name], Erin [name] ([id number]) Visit Report for 06/02/2019 HPI Details Patient Name: Date of Service: Erin [name], Erin [name] 06/02/2019 3:30 PM Medical Record [id number] Patient Account Number: [id number] Date of Birth/Sex: Treating RN: [birth year] (86 y.o. [name] Primary Care Provider: [name] Other Clinician: Referring Provider: Treating Provider/Extender:Ndeye Tenorio, [name], [name] in Treatment: 7 History of Present Illness HPI Description: We to find a new wound on the left 03/22/17; this is an 86 year old woman. There is not a lot of information in care everywhere on this patient. She had a history of a malignant neoplasm her left breast for which she had lumpectomy but she did not have radiation. Apparently she has had long-standing edema and her lower legs and she was seen in the wound care center at [facility] in [place] by [name] October. Both her legs were wrapped however it sounds as though the left leg became secondarily infected she apparently had MRSA and she was admitted the hospital. Not really turn for wound care. She lives at home on her own and has apparently [facility] home health care. We are not exactly clear what [facility] is doing to her legs but it does not involve compression. She is not a diabetic. ABIs in our clinic were noncompressible bilaterally 03/30/17; this is a patient who has bilateral chronic venous insufficiency, severe venous inflammation. We had a nice description number legs from a note by [name] dating 2010 describing edematous warm legs with erythema. This suggests that she has chronic venous insufficiency with inflammation.. We have arterial studies dated 12/29/16 again from [facility]. This showed a right ABI at 1 and a TBI of 0.81 on the left the ABI was 1.25 and a TBI of 0.72. This was just she does not have significant arterial insufficiency. She had normal triphasic waveforms noted that she was  admitted to hospital in late October with MSSA cellulitis Last visit we wrapped both her legs. She had an open area predominantly on the left anterior but weeping edema on the right anterior leg. She has done well there is only one at anterior left tibial wound 04/06/17; the patient has no open area on her right leg. Her remaining wound is on the left anterior leg, the left posterior leg wound is healed. We have been using silver alginate changed to [facility] on the left leg today she has home health 04/13/17; still no open area on her right leg. Her remaining wound on the left anterior leg. We have been using using Hydrofera Blue 04/20/17; no open area on the right leg although the edema here is somewhat disfiguring. Her remaining wound is on the left anterior leg we've been using Hydrofera Blue with improvement in dimensions. She has [facility] home health and lives in [place]. She requests to come every 2 weeks because of the distance involved in coming here 05/04/17; there is no open area on the right leg. She has a very small left anterior leg wound that remains. However what remains on the left still has some depth and a not to viable looking surface. It is too small to really attempt debridement. She has home health coming out to her home in [facility] 05/18/17; there is no open area on either leg. She has significant chronic venous insufficiency. The left anterior leg wound that was still open 2 weeks ago has closed. She has home health coming out. We are going to order Juxtalite stockings for both legs. She is agreed to pay for  these privately. We will order them from [facility] READMISSION 04/14/2019 This is a patient we previously had in this clinic in 2019 for 3 months. She had a wound on the left anterior greater than right anterior leg. According to my notes we discharged her and juxta lite stockings although it does not appear that she ever got them. The history here is a bit  difficult. She apparently has had a wound on the left lower leg for the last 6 months. She required admission to hospital in [facility] for apparent cellulitis just before Christmas was discharged home she has an open wound. She has a home health agency although we are not exactly sure which one. They are applying silver alginate. No compression Past medical history is reviewed she has chronic venous insufficiency and a history of left breast CA. 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 She did not have any compression on the wounds today. 2/8; substantial wound on the right posterior calf and a smaller area on the left medial calf. We are using Iodoflex to both wound areas under compression. We have better edema control We to find a new wound on the left anterior medial calf 2/15; the area on the left medial calf is closed over. Substantial area on the right posterior and lateral calf. We have been using Iodoflex although the patient complains of pain and wonders if there is not an alternative. She has home health changing the dressing twice a week and she sees [place] once 2/22; the wound on the left medial calf is closed over. Her edema control is not good here but she cannot get on stockings. This substantial wound is on the right lateral and right posterior calf. Changed her to a hydrofera Blue last week. Gradual improvement in wound area. 3/8; wound on the left leg remains closed. The substantial wound is on the right lateral and right posterior calf. This is measuring smaller 3/22; 2-week follow-up. Substantial area on the right posterior and lateral calf measures slightly smaller. We are using Hydrofera Blue under compression Electronic Signature(s) Signed: 06/02/2019 5:34:39 PM By: Linton Ham MD Entered By: Linton Ham on 06/02/2019  17:19:11 -------------------------------------------------------------------------------- Physical Exam Details Patient Name: Date of Service: Erin [name] 06/02/2019 3:30 PM Medical Record [id number] Patient Account Number: [id number] Date of Birth/Sex: Treating RN: 04-23-1930 (86 y.o. [name] Primary Care Provider: [name] Other Clinician: Referring Provider: Treating Provider/Extender:Jaquisha Frech, [name], [name] Weeks in Treatment: 7 Constitutional Patient is hypertensive.. Pulse regular and within target range for patient.Marland Kitchen Respirations regular, non-labored and within target range.. Temperature is normal and within the target range for the patient.Marland Kitchen Appears in no distress. Cardiovascular Pedal pulses are palpable. Notes Wound exam; the condition of the wound bed continues to come improved. No mechanical debridement is necessary however the wound is vigorously scrubbed with Anasept and gauze. Edema control is good. Electronic Signature(s) Signed: 06/02/2019 5:34:39 PM By: Linton Ham MD Entered By: Linton Ham on 06/02/2019 17:20:01 -------------------------------------------------------------------------------- Physician Orders Details Patient Name: Date of Service: Erin [name] 06/02/2019 3:30 PM Medical Record [id number] Patient Account Number: [id number] Date of Birth/Sex: Treating RN: Oct 26, 1930 (86 y.o. [name] Primary Care Provider: [name] Other Clinician: Referring Provider: Treating Provider/Extender:Mikeala Girdler, [name], [name] in Treatment: 7 Verbal / Phone Orders: No Diagnosis Coding ICD-10 Coding Code Description I87.331 Chronic venous hypertension (  idiopathic) with ulcer and inflammation of right lower extremity L97.812 Non-pressure chronic ulcer of other part of right lower leg with fat layer exposed I89.0 Lymphedema, not elsewhere classified L97.821 Non-pressure chronic ulcer of  other part of left lower leg limited to breakdown of skin Follow-up Appointments Return Appointment in 2 weeks. Dressing Change Frequency Wound #6 Right Lower Leg Change dressing three times week. - by home health. Home health to change 2x a week on week that patient has appt at wound clinic. Skin Barriers/Peri-Wound Care Barrier cream Moisturizing lotion Wound Cleansing May shower with protection. - use cast protector Primary Wound Dressing Wound #6 Right Lower Leg Hydrofera Blue - may apply adaptic under Hydrofera to prevent from sticking Secondary Dressing Wound #6 Right Lower Leg ABD pad Zetuvit or Kerramax - or Xtrasorb (or other superabsorbent dressing) Edema Control 3 Layer Compression System - Right Lower Extremity Avoid standing for long periods of time Elevate legs to the level of the heart or above for 30 minutes daily and/or when sitting, a frequency of: - throughout the day [facility] skilled nursing for wound care. - [facility] Electronic Signature(s) Signed: 06/02/2019 5:18:21 PM By: [name] RN, BSN Signed: 06/02/2019 5:34:39 PM By: Linton Ham MD Entered By: [name] on 06/02/2019 15:51:36 -------------------------------------------------------------------------------- Problem List Details Patient Name: Date of Service: Erin [name] 06/02/2019 3:30 PM Medical Record [id number] Patient Account Number: [id number] Date of Birth/Sex: Treating RN: 06/25/1930 (86 y.o. [name] Primary Care Provider: [name] Other Clinician: Referring Provider: Treating Provider/Extender:Thermon Zulauf, [name], [name] in Treatment: 7 Active Problems ICD-10 Evaluated Encounter Code Description Active Date Today Diagnosis I87.331 Chronic venous hypertension (idiopathic) with ulcer 04/14/2019 No Yes and inflammation of right lower extremity L97.812 Non-pressure chronic ulcer of other part of right lower 04/14/2019  No Yes leg with fat layer exposed I89.0 Lymphedema, not elsewhere classified 04/14/2019 No Yes L97.821 Non-pressure chronic ulcer of other part of left lower 04/21/2019 No Yes leg limited to breakdown of skin Inactive Problems Resolved Problems Electronic Signature(s) Signed: 06/02/2019 5:34:39 PM By: Linton Ham MD Entered By: Linton Ham on 06/02/2019 17:17:11 -------------------------------------------------------------------------------- Progress Note Details Patient Name: Date of Service: Erin [name] 06/02/2019 3:30 PM Medical Record [id number] Patient Account Number: [id number] Date of Birth/Sex: Treating RN: 1930/12/14 (86 y.o. [name] Primary Care Provider: [name] Other Clinician: Referring Provider: Treating Provider/Extender:Padme Arriaga, [name], [name] Weeks in Treatment: 7 Subjective History of Present Illness (HPI) We to find a new wound on the left 03/22/17; this is an 86 year old woman. There is not a lot of information in care everywhere on this patient. She had a history of a malignant neoplasm her left breast for which she had lumpectomy but she did not have radiation. Apparently she has had long-standing edema and her lower legs and she was seen in the wound care center at [facility] in [place] by [name] October. Both her legs were wrapped however it sounds as though the left leg became secondarily infected she apparently had MRSA and she was admitted the hospital. Not really turn for wound care. She lives at home on her own and has apparently [facility] home health care. We are not exactly clear what [facility] is doing to her legs but it does not involve compression. She is not a diabetic. ABIs in our clinic were noncompressible bilaterally 03/30/17; this is a patient who has bilateral chronic venous insufficiency, severe venous inflammation. We had a nice description number legs from a  note by [name] dating 2010  describing edematous warm legs with erythema. This suggests that she has chronic venous insufficiency with inflammation.. We have arterial studies dated 12/29/16 again from [facility]. This showed a right ABI at 1 and a TBI of 0.81 on the left the ABI was 1.25 and a TBI of 0.72. This was just she does not have significant arterial insufficiency. She had normal triphasic waveforms noted that she was admitted to hospital in late October with MSSA cellulitis Last visit we wrapped both her legs. She had an open area predominantly on the left anterior but weeping edema on the right anterior leg. She has done well there is only one at anterior left tibial wound 04/06/17; the patient has no open area on her right leg. Her remaining wound is on the left anterior leg, the left posterior leg wound is healed. We have been using silver alginate changed to [facility] on the left leg today she has home health 04/13/17; still no open area on her right leg. Her remaining wound on the left anterior leg. We have been using using Hydrofera Blue 04/20/17; no open area on the right leg although the edema here is somewhat disfiguring. Her remaining wound is on the left anterior leg we've been using Hydrofera Blue with improvement in dimensions. She has [facility] home health and lives in [place]. She requests to come every 2 weeks because of the distance involved in coming here 05/04/17; there is no open area on the right leg. She has a very small left anterior leg wound that remains. However what remains on the left still has some depth and a not to viable looking surface. It is too small to really attempt debridement. She has home health coming out to her home in [facility] 05/18/17; there is no open area on either leg. She has significant chronic venous insufficiency. The left anterior leg wound that was still open 2 weeks ago has closed. She has home health coming out. We are going to order Juxtalite  stockings for both legs. She is agreed to pay for these privately. We will order them from [facility] READMISSION 04/14/2019 This is a patient we previously had in this clinic in 2019 for 3 months. She had a wound on the left anterior greater than right anterior leg. According to my notes we discharged her and juxta lite stockings although it does not appear that she ever got them. The history here is a bit difficult. She apparently has had a wound on the left lower leg for the last 6 months. She required admission to hospital in [facility] for apparent cellulitis just before Christmas was discharged home she has an open wound. She has a home health agency although we are not exactly sure which one. They are applying silver alginate. No compression Past medical history is reviewed she has chronic venous insufficiency and a history of left breast CA. 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 She did not have any compression on the wounds today. 2/8; substantial wound on the right posterior calf and a smaller area on the left medial calf. We are using Iodoflex to both wound areas under compression. We have better edema control We to find a new wound on the left anterior medial calf 2/15; the area on the left medial calf is closed over. Substantial  area on the right posterior and lateral calf. We have been using Iodoflex although the patient complains of pain and wonders if there is not an alternative. She has home health changing the dressing twice a week and she sees [place] once 2/22; the wound on the left medial calf is closed over. Her edema control is not good here but she cannot get on stockings. This substantial wound is on the right lateral and right posterior calf. Changed her to a hydrofera Blue last week. Gradual improvement in wound area. 3/8; wound on the left leg  remains closed. The substantial wound is on the right lateral and right posterior calf. This is measuring smaller 3/22; 2-week follow-up. Substantial area on the right posterior and lateral calf measures slightly smaller. We are using Hydrofera Blue under compression Objective Constitutional Patient is hypertensive.. Pulse regular and within target range for patient.Marland Kitchen Respirations regular, non-labored and within target range.. Temperature is normal and within the target range for the patient.Marland Kitchen Appears in no distress. Vitals Time Taken: 3:45 PM, Height: 60 in, Weight: 130 lbs, BMI: 25.4, Temperature: 98.3 F, Pulse: 61 bpm, Respiratory Rate: 18 breaths/min, Blood Pressure: 178/67 mmHg. Cardiovascular Pedal pulses are palpable. General Notes: Wound exam; the condition of the wound bed continues to come improved. No mechanical debridement is necessary however the wound is vigorously scrubbed with Anasept and gauze. Edema control is good. Integumentary (Hair, Skin) Wound #6 status is Open. Original cause of wound was Gradually Appeared. The wound is located on the Right Lower Leg. The wound measures 9cm length x 13cm width x 0.1cm depth; 91.892cm^2 area and 9.189cm^3 volume. There is Fat Layer (Subcutaneous Tissue) Exposed exposed. There is no tunneling or undermining noted. There is a large amount of serosanguineous drainage noted. The wound margin is well defined and not attached to the wound base. There is medium (34-66%) red, pink granulation within the wound bed. There is a medium (34-66%) amount of necrotic tissue within the wound bed including Adherent Slough. Assessment Active Problems ICD-10 Chronic venous hypertension (idiopathic) with ulcer and inflammation of right lower extremity Non-pressure chronic ulcer of other part of right lower leg with fat layer exposed Lymphedema, not elsewhere classified Non-pressure chronic ulcer of other part of left lower leg limited to breakdown of  skin Procedures Wound #6 Pre-procedure diagnosis of Wound #6 is a Lymphedema located on the Right Lower Leg . There was a Three Layer Compression Therapy Procedure by [name], RN. Post procedure Diagnosis Wound #6: Same as Pre-Procedure Plan Follow-up Appointments: Return Appointment in 2 weeks. Dressing Change Frequency: Wound #6 Right Lower Leg: Change dressing three times week. - by home health. Home health to change 2x a week on week that patient has appt at wound clinic. Skin Barriers/Peri-Wound Care: Barrier cream Moisturizing lotion Wound Cleansing: May shower with protection. - use cast protector Primary Wound Dressing: Wound #6 Right Lower Leg: Hydrofera Blue - may apply adaptic under Hydrofera to prevent from sticking Secondary Dressing: Wound #6 Right Lower Leg: ABD pad Zetuvit or Kerramax - or Xtrasorb (or other superabsorbent dressing) Edema Control: 3 Layer Compression System - Right Lower Extremity Avoid standing for long periods of time Elevate legs to the level of the heart or above for 30 minutes daily and/or when sitting, a frequency of: - throughout the day Home Health: [facility] skilled nursing for wound care. - [facility] continue with Hydrofera Blue/ABDs under 3 layer compression 2. The wound is measuring slightly smaller although I do not  see a lot of progressive epithelialization. 3. This is going to take a long time to heal perhaps as long as 3 months Electronic Signature(s) Signed: 06/02/2019 5:34:39 PM By: Linton Ham MD Entered By: Linton Ham on 06/02/2019 17:21:06 -------------------------------------------------------------------------------- SuperBill Details Patient Name: Date of Service: Erin [name] 06/02/2019 Medical Record [id number] Patient Account Number: [id number] Date of Birth/Sex: Treating RN: 1930/09/10 (86 y.o. [name] Primary Care Provider: [name] Other  Clinician: Referring Provider: Treating Provider/Extender:[name] Grimmer, [name], [name] Weeks in Treatment: 7 Diagnosis Coding ICD-10 Codes Code Description I87.331 Chronic venous hypertension (idiopathic) with ulcer and inflammation of right lower extremity L97.812 Non-pressure chronic ulcer of other part of right lower leg with fat layer exposed I89.0 Lymphedema, not elsewhere classified L97.821 Non-pressure chronic ulcer of other part of left lower leg limited to breakdown of skin Facility Procedures CPT4 Code Description: [id number] (Facility Use Only) [phone] - APPLY MULTLAY COMPRS LWR RT LEG Modifier: Quantity: 1 Physician Procedures CPT4 Code Description: [id number] - WC PHYS LEVEL 3 - EST PT ICD-10 Diagnosis Description S93.734 Non-pressure chronic ulcer of other part of right lower le I87.331 Chronic venous hypertension (idiopathic) with ulcer and in lower extremity Modifier: g with fat layer flammation of ri Quantity: 1 exposed ght Electronic Signature(s) Signed: 06/02/2019 5:34:39 PM By: Linton Ham MD Previous Signature: 06/02/2019 5:18:21 PM Version By: [name] RN, BSN Entered By: Linton Ham on 06/02/2019 17:21:23

## 2019-06-16 ENCOUNTER — Encounter (HOSPITAL_BASED_OUTPATIENT_CLINIC_OR_DEPARTMENT_OTHER): Payer: Medicare HMO | Attending: Internal Medicine | Admitting: Internal Medicine

## 2019-06-16 ENCOUNTER — Other Ambulatory Visit: Payer: Self-pay

## 2019-06-16 DIAGNOSIS — Z8614 Personal history of Methicillin resistant Staphylococcus aureus infection: Secondary | ICD-10-CM | POA: Diagnosis not present

## 2019-06-16 DIAGNOSIS — Z853 Personal history of malignant neoplasm of breast: Secondary | ICD-10-CM | POA: Diagnosis not present

## 2019-06-16 DIAGNOSIS — I890 Lymphedema, not elsewhere classified: Secondary | ICD-10-CM | POA: Diagnosis not present

## 2019-06-16 DIAGNOSIS — L97812 Non-pressure chronic ulcer of other part of right lower leg with fat layer exposed: Secondary | ICD-10-CM | POA: Diagnosis not present

## 2019-06-16 NOTE — Progress Notes (Signed)
Erin [name], Erin [name] ([id number]) Visit Report for 06/16/2019 HPI Details Patient Name: Date of Service: RELDA, [name] 06/16/2019 3:30 PM Medical Record [id number] Patient Account Number: [id number] Date of Birth/Sex: Treating RN: 1931/03/01 (86 y.o. Erin [name] Primary Care Provider: Arsenio [name] Other Clinician: Referring Provider: Treating Provider/Extender:Erin [name], Erin [name], Erin [name] in Treatment: 9 History of Present Illness HPI Description: We to find a new wound on the left 03/22/17; this is an 86 year old woman. There is not a lot of information in care everywhere on this patient. She had a history of a malignant neoplasm her left breast for which she had lumpectomy but she did not have radiation. Apparently she has had long-standing edema and her lower legs and she was seen in the wound care center at [facility] in [place] by Dr. Nils [name] October. Both her legs were wrapped however it sounds as though the left leg became secondarily infected she apparently had MRSA and she was admitted the hospital. Not really turn for wound care. She lives at home on her own and has apparently [facility] home health care. We are not exactly clear what [facility] is doing to her legs but it does not involve compression. She is not a diabetic. ABIs in our clinic were noncompressible bilaterally 03/30/17; this is a patient who has bilateral chronic venous insufficiency, severe venous inflammation. We had a nice description number legs from a note by Erin [name] dating 2010 describing edematous warm legs with erythema. This suggests that she has chronic venous insufficiency with inflammation.. We have arterial studies dated 12/29/16 again from [facility]. This showed a right ABI at 1 and a TBI of 0.81 on the left the ABI was 1.25 and a TBI of 0.72. This was just she does not have significant arterial insufficiency. She had normal triphasic waveforms noted that she was  admitted to hospital in late October with MSSA cellulitis Last visit we wrapped both her legs. She had an open area predominantly on the left anterior but weeping edema on the right anterior leg. She has done well there is only one at anterior left tibial wound 04/06/17; the patient has no open area on her right leg. Her remaining wound is on the left anterior leg, the left posterior leg wound is healed. We have been using silver alginate changed to [facility] on the left leg today she has home health 04/13/17; still no open area on her right leg. Her remaining wound on the left anterior leg. We have been using using Hydrofera Blue 04/20/17; no open area on the right leg although the edema here is somewhat disfiguring. Her remaining wound is on the left anterior leg we've been using Hydrofera Blue with improvement in dimensions. She has [facility] home health and lives in [place]. She requests to come every 2 weeks because of the distance involved in coming here 05/04/17; there is no open area on the right leg. She has a very small left anterior leg wound that remains. However what remains on the left still has some depth and a not to viable looking surface. It is too small to really attempt debridement. She has home health coming out to her home in [place] [facility] 05/18/17; there is no open area on either leg. She has significant chronic venous insufficiency. The left anterior leg wound that was still open 2 weeks ago has closed. She has home health coming out. We are going to order Juxtalite stockings for both legs. She is agreed to pay for  these privately. We will order them from [facility] READMISSION 04/14/2019 This is a patient we previously had in this clinic in 2019 for 3 months. She had a wound on the left anterior greater than right anterior leg. According to my notes we discharged her and juxta lite stockings although it does not appear that she ever got them. The history here is a bit  difficult. She apparently has had a wound on the left lower leg for the last 6 months. She required admission to hospital in [facility] for apparent cellulitis just before Christmas was discharged home she has an open wound. She has a home health agency although we are not exactly sure which one. They are applying silver alginate. No compression Past medical history is reviewed she has chronic venous insufficiency and a history of left breast CA. 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 She did not have any compression on the wounds today. 2/8; substantial wound on the right posterior calf and a smaller area on the left medial calf. We are using Iodoflex to both wound areas under compression. We have better edema control We to find a new wound on the left anterior medial calf 2/15; the area on the left medial calf is closed over. Substantial area on the right posterior and lateral calf. We have been using Iodoflex although the patient complains of pain and wonders if there is not an alternative. She has home health changing the dressing twice a week and she sees [place] once 2/22; the wound on the left medial calf is closed over. Her edema control is not good here but she cannot get on stockings. This substantial wound is on the right lateral and right posterior calf. Changed her to a hydrofera Blue last week. Gradual improvement in wound area. 3/8; wound on the left leg remains closed. The substantial wound is on the right lateral and right posterior calf. This is measuring smaller 3/22; 2-week follow-up. Substantial area on the right posterior and lateral calf measures slightly smaller. We are using Hydrofera Blue under compression 4/5; 2-week follow-up. Substantial area on the right posterior lateral calf. This measures slightly smaller. She has islands of  epithelialization we have been using Hydrofera Blue under compression Electronic Signature(s) Signed: 06/16/2019 5:24:04 PM By: Linton Ham MD Entered By: Linton Ham on 06/16/2019 17:14:23 -------------------------------------------------------------------------------- Physical Exam Details Patient Name: Date of Service: Erin Carbon A. 06/16/2019 3:30 PM Medical Record [id number] Patient Account Number: [id number] Date of Birth/Sex: Treating RN: 1930-05-31 (86 y.o. Erin [name] Primary Care Provider: Arsenio [name] Other Clinician: Referring Provider: Treating Provider/Extender:Jozlyn Schatz, Erin [name], Erin [name] in Treatment: 9 Constitutional Patient is hypertensive.. Pulse regular and within target range for patient.Marland Kitchen Respirations regular, non-labored and within target range.. Temperature is normal and within the target range for the patient.Marland Kitchen Appears in no distress. Cardiovascular Pedal pulses are palpable. We have good edema control.. Notes Wound exam; the condition of the wound bed continues to be improved no mechanical debridement is necessary however the wound is vigorously washed with wound cleanser and gauze. Edema control is under good control she has islands of epithelialization which is interesting. We will see how this progresses Electronic Signature(s) Signed: 06/16/2019 5:24:04 PM By: Linton Ham MD Entered By: Linton Ham on 06/16/2019 17:15:20 -------------------------------------------------------------------------------- Physician Orders Details Patient Name: Date of Service: Erin Carbon A. 06/16/2019 3:30 PM Medical  Record [id number] Patient Account Number: [id number] Date of Birth/Sex: Treating RN: October 07, 1930 (86 y.o. Erin [name] Primary Care Provider: Arsenio [name] Other Clinician: Referring Provider: Treating Provider/Extender:Vannie Hilgert, Erin [name], Erin [name] in Treatment: 9 Verbal / Phone Orders:  No Diagnosis Coding ICD-10 Coding Code Description I87.331 Chronic venous hypertension (idiopathic) with ulcer and inflammation of right lower extremity L97.812 Non-pressure chronic ulcer of other part of right lower leg with fat layer exposed I89.0 Lymphedema, not elsewhere classified L97.821 Non-pressure chronic ulcer of other part of left lower leg limited to breakdown of skin Follow-up Appointments Return Appointment in 2 weeks. Dressing Change Frequency Wound #6 Right Lower Leg Change dressing three times week. - by home health. Home health to change 2x a week on week that patient has appt at wound clinic. Skin Barriers/Peri-Wound Care Barrier cream Moisturizing lotion Wound Cleansing May shower with protection. - use cast protector Primary Wound Dressing Wound #6 Right Lower Leg Hydrofera Blue - may apply adaptic under Hydrofera to prevent from sticking Secondary Dressing Wound #6 Right Lower Leg ABD pad Zetuvit or Kerramax - or Xtrasorb (or other superabsorbent dressing) Edema Control 3 Layer Compression System - Right Lower Extremity Avoid standing for long periods of time Elevate legs to the level of the heart or above for 30 minutes daily and/or when sitting, a frequency of: - throughout the day [place] skilled nursing for wound care. - [facility] Electronic Signature(s) Signed: 06/16/2019 5:09:30 PM By: [name] RN, BSN Signed: 06/16/2019 5:24:04 PM By: Linton Ham MD Entered By: [name] on 06/16/2019 16:04:05 -------------------------------------------------------------------------------- Problem List Details Patient Name: Date of Service: Erin [name] 06/16/2019 3:30 PM Medical Record [id number] Patient Account Number: [id number] Date of Birth/Sex: Treating RN: [birth year] (86 y.o. Erin [name] Primary Care Provider: Arsenio [name] Other Clinician: Referring Provider: Treating Provider/Extender:Javed Cotto,  Erin [name], Erin [name] in Treatment: 9 Active Problems ICD-10 Evaluated Encounter Code Description Active Date Today Diagnosis I87.331 Chronic venous hypertension (idiopathic) with ulcer 04/14/2019 No Yes and inflammation of right lower extremity L97.812 Non-pressure chronic ulcer of other part of right lower 04/14/2019 No Yes leg with fat layer exposed I89.0 Lymphedema, not elsewhere classified 04/14/2019 No Yes L97.821 Non-pressure chronic ulcer of other part of left lower 04/21/2019 No Yes leg limited to breakdown of skin Inactive Problems Resolved Problems Electronic Signature(s) Signed: 06/16/2019 5:24:04 PM By: Linton Ham MD Previous Signature: 06/16/2019 5:09:30 PM Version By: [name] RN, BSN Entered By: Linton Ham on 06/16/2019 17:13:25 -------------------------------------------------------------------------------- Progress Note Details Patient Name: Date of Service: Erin [name] 06/16/2019 3:30 PM Medical Record [id number] Patient Account Number: [id number] Date of Birth/Sex: Treating RN: 30-Nov-1930 (86 y.o. Erin [name] Primary Care Provider: Arsenio [name] Other Clinician: Referring Provider: Treating Provider/Extender:Laurene Melendrez, Erin [name], [name] Weeks in Treatment: 9 Subjective History of Present Illness (HPI) We to find a new wound on the left 03/22/17; this is an 86 year old woman. There is not a lot of information in care everywhere on this patient. She had a history of a malignant neoplasm her left breast for which she had lumpectomy but she did not have radiation. Apparently she has had long-standing edema and her lower legs and she was seen in the wound care center at [facility] in [place] by Dr. Nils [name] October. Both her legs were wrapped however it sounds as though the left leg became secondarily infected she apparently had MRSA and she was admitted the hospital. Not really turn for wound care. She lives at home on her  own  and has apparently [facility] home health care. We are not exactly clear what [facility] is doing to her legs but it does not involve compression. She is not a diabetic. ABIs in our clinic were noncompressible bilaterally 03/30/17; this is a patient who has bilateral chronic venous insufficiency, severe venous inflammation. We had a nice description number legs from a note by Erin [name] dating 2010 describing edematous warm legs with erythema. This suggests that she has chronic venous insufficiency with inflammation.. We have arterial studies dated 12/29/16 again from [facility]. This showed a right ABI at 1 and a TBI of 0.81 on the left the ABI was 1.25 and a TBI of 0.72. This was just she does not have significant arterial insufficiency. She had normal triphasic waveforms noted that she was admitted to hospital in late October with MSSA cellulitis Last visit we wrapped both her legs. She had an open area predominantly on the left anterior but weeping edema on the right anterior leg. She has done well there is only one at anterior left tibial wound 04/06/17; the patient has no open area on her right leg. Her remaining wound is on the left anterior leg, the left posterior leg wound is healed. We have been using silver alginate changed to [facility] on the left leg today she has home health 04/13/17; still no open area on her right leg. Her remaining wound on the left anterior leg. We have been using using Hydrofera Blue 04/20/17; no open area on the right leg although the edema here is somewhat disfiguring. Her remaining wound is on the left anterior leg we've been using Hydrofera Blue with improvement in dimensions. She has [facility] home health and lives in [place]. She requests to come every 2 weeks because of the distance involved in coming here 05/04/17; there is no open area on the right leg. She has a very small left anterior leg wound that remains. However what remains on  the left still has some depth and a not to viable looking surface. It is too small to really attempt debridement. She has home health coming out to her home in [facility] 05/18/17; there is no open area on either leg. She has significant chronic venous insufficiency. The left anterior leg wound that was still open 2 weeks ago has closed. She has home health coming out. We are going to order Juxtalite stockings for both legs. She is agreed to pay for these privately. We will order them from [facility] READMISSION 04/14/2019 This is a patient we previously had in this clinic in 2019 for 3 months. She had a wound on the left anterior greater than right anterior leg. According to my notes we discharged her and juxta lite stockings although it does not appear that she ever got them. The history here is a bit difficult. She apparently has had a wound on the left lower leg for the last 6 months. She required admission to hospital in [facility] for apparent cellulitis just before Christmas was discharged home she has an open wound. She has a home health agency although we are not exactly sure which one. They are applying silver alginate. No compression Past medical history is reviewed she has chronic venous insufficiency and a history of left breast CA. 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 She did not have any  compression on the wounds today. 2/8; substantial wound on the right posterior calf and a smaller area on the left medial calf. We are using Iodoflex to both wound areas under compression. We have better edema control We to find a new wound on the left anterior medial calf 2/15; the area on the left medial calf is closed over. Substantial area on the right posterior and lateral calf. We have been using Iodoflex although the patient complains of pain and wonders if there is not an  alternative. She has home health changing the dressing twice a week and she sees [place] once 2/22; the wound on the left medial calf is closed over. Her edema control is not good here but she cannot get on stockings. This substantial wound is on the right lateral and right posterior calf. Changed her to a hydrofera Blue last week. Gradual improvement in wound area. 3/8; wound on the left leg remains closed. The substantial wound is on the right lateral and right posterior calf. This is measuring smaller 3/22; 2-week follow-up. Substantial area on the right posterior and lateral calf measures slightly smaller. We are using Hydrofera Blue under compression 4/5; 2-week follow-up. Substantial area on the right posterior lateral calf. This measures slightly smaller. She has islands of epithelialization we have been using Hydrofera Blue under compression Objective Constitutional Patient is hypertensive.. Pulse regular and within target range for patient.Marland Kitchen Respirations regular, non-labored and within target range.. Temperature is normal and within the target range for the patient.Marland Kitchen Appears in no distress. Vitals Time Taken: 3:20 PM, Height: 60 in, Weight: 130 lbs, BMI: 25.4, Temperature: 98.2 F, Pulse: 75 bpm, Respiratory Rate: 19 breaths/min, Blood Pressure: 161/59 mmHg. Cardiovascular Pedal pulses are palpable. We have good edema control.. General Notes: Wound exam; the condition of the wound bed continues to be improved no mechanical debridement is necessary however the wound is vigorously washed with wound cleanser and gauze. Edema control is under good control she has islands of epithelialization which is interesting. We will see how this progresses Integumentary (Hair, Skin) Wound #6 status is Open. Original cause of wound was Gradually Appeared. The wound is located on the Right Lower Leg. The wound measures 11.5cm length x 12.5cm width x 0.1cm depth; 112.901cm^2 area and  11.29cm^3 volume. 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 There was a Three Layer Compression Therapy Procedure by [name], RN. Post procedure Diagnosis Wound #6: Same as Pre-Procedure Plan Follow-up Appointments: Return Appointment in 2 weeks. Dressing Change Frequency: Wound #6 Right Lower Leg: Change dressing three times week. - by home health. Home health to change 2x a week on week that patient has appt at wound clinic. Skin Barriers/Peri-Wound Care: Barrier cream Moisturizing lotion Wound Cleansing: May shower with protection. - use cast protector Primary Wound Dressing: Wound #6 Right Lower Leg: Hydrofera Blue - may apply adaptic under Hydrofera to prevent from sticking Secondary Dressing: Wound #6 Right Lower Leg: ABD pad Zetuvit or Kerramax - or Xtrasorb (or other superabsorbent dressing) Edema Control: 3 Layer Compression System - Right Lower Extremity Avoid standing for long periods of time Elevate legs to the level of the heart or  above for 30 minutes daily and/or when sitting, a frequency of: - throughout the day Home Health: [place] skilled nursing for wound care. - [facility] 1. Hydrofera Blue/ABDs/3 layer compression 2. Careful attention to the islands of epithelialization next time Electronic Signature(s) Signed: 06/16/2019 5:24:04 PM By: Linton Ham MD Entered By: Linton Ham on 06/16/2019 17:16:08 -------------------------------------------------------------------------------- SuperBill Details Patient Name: Date of  Service: Erin [name] 06/16/2019 Medical Record [id number] Patient Account Number: [id number] Date of Birth/Sex: Treating RN: 04/16/1930 (86 y.o. Erin [name] Primary Care Provider: Arsenio [name] Other Clinician: Referring Provider: Treating Provider/Extender:Jaliyah Fotheringham, Erin [name], [name] Weeks in Treatment: 9 Diagnosis Coding ICD-10 Codes Code Description I87.331 Chronic venous hypertension (idiopathic) with ulcer and inflammation of right lower extremity L97.812 Non-pressure chronic ulcer of other part of right lower leg with fat layer exposed I89.0 Lymphedema, not elsewhere classified L97.821 Non-pressure chronic ulcer of other part of left lower leg limited to breakdown of skin Facility Procedures CPT4 Code Description: [id number] (Facility Use Only) [phone] - APPLY Becker LWR LT LEG Modifier: Quantity: 1 Physician Procedures CPT4 Code Description: [id number] - WC PHYS LEVEL 3 - EST PT ICD-10 Diagnosis Description I87.331 Chronic venous hypertension (idiopathic) with ulcer and in lower extremity L97.812 Non-pressure chronic ulcer of other part of right lower le I89.0  Lymphedema, not elsewhere classified Modifier: flammation of ri g with fat layer Quantity: 1 ght exposed Electronic Signature(s) Signed: 06/16/2019 5:24:04 PM By: Linton Ham MD Previous Signature: 06/16/2019 5:09:30 PM Version By: [name] RN, BSN Entered By: Linton Ham on 06/16/2019 17:16:39

## 2019-06-16 NOTE — Progress Notes (Signed)
MESA, JANUS ([id number]) Visit Report for 06/16/2019 Arrival Information Details Patient Name: Date of Service: Erin [name], Erin [name] 06/16/2019 3:30 PM Medical Record [id number] Patient Account Number: [id number] Date of Birth/Sex: Treating RN: 01/21/1931 (86 y.o. [name] Primary Care Quamel Fitzmaurice: [name] Other Clinician: Referring Marlo Goodrich: Treating Tarnisha Kachmar/Extender:Robson, [name], [name] in Treatment: 9 Visit Information History Since Last Visit Added or deleted any medications: No Patient Arrived: Wheel Chair Any new allergies or adverse reactions: No Arrival Time: 15:23 Had a fall or experienced change in No Accompanied By: family member activities of daily living that may affect Transfer Assistance: Manual risk of falls: Patient Identification Verified: Yes Signs or symptoms of abuse/neglect since last No Secondary Verification Process Yes visito Completed: Hospitalized since last visit: No Patient Requires Transmission- No Implantable device outside of the clinic excluding No Based Precautions: cellular tissue based products placed in the center Patient Has Alerts: Yes since last visit: Patient Alerts: NON COMPRESSABLE Has Dressing in Place as Prescribed: Yes Has Compression in Place as Prescribed: Yes Pain Present Now: No Electronic Signature(s) Signed: 06/16/2019 4:55:48 PM By: Kela Millin Entered By: Kela Millin on 06/16/2019 15:24:34 -------------------------------------------------------------------------------- Compression Therapy Details Patient Name: Date of Service: Erin [name] 06/16/2019 3:30 PM Medical Record [id number] Patient Account Number: [id number] Date of Birth/Sex: Treating RN: 1930/11/06 (86 y.o. Erin [name] Primary Care Momina Hunton: [name] Other Clinician: Referring Kobee Medlen: Treating Treyshon Buchanon/Extender:Robson, [name], [name] Weeks in Treatment:  9 Compression Therapy Performed for Wound Wound #6 Right Lower Leg Assessment: Performed By: Clinician [name], RN Compression Type: Three Layer Post Procedure Diagnosis Same as Pre-procedure Electronic Signature(s) Signed: 06/16/2019 5:09:30 PM By: [name] RN, BSN Entered By: [name] on 06/16/2019 16:04:58 -------------------------------------------------------------------------------- Encounter Discharge Information Details Patient Name: Date of Service: Erin [name] 06/16/2019 3:30 PM Medical Record [id number] Patient Account Number: [id number] Date of Birth/Sex: Treating RN: [birth year] (86 y.o. [name] Primary Care Erin [name]: [name] Other Clinician: Referring Barnett Elzey: Treating Maria Coin/Extender:Robson, [name], [name] in Treatment: 9 Encounter Discharge Information Items Discharge Condition: Stable Ambulatory Status: Wheelchair Discharge Destination: Home Transportation: Private Auto Accompanied By: family member Schedule Follow-up Appointment: Yes Clinical Summary of Care: Patient Declined Electronic Signature(s) Signed: 06/16/2019 4:55:48 PM By: Kela Millin Entered By: Kela Millin on 06/16/2019 16:54:36 -------------------------------------------------------------------------------- Lower Extremity Assessment Details Patient Name: Date of Service: Erin [name] 06/16/2019 3:30 PM Medical Record [id number] Patient Account Number: [id number] Date of Birth/Sex: Treating RN: May 22, 1930 (86 y.o. [name] Primary Care Dorathy Stallone: [name] Other Clinician: Referring Yakelin Grenier: Treating Tyanna Hach/Extender:Robson, [name], [name] Weeks in Treatment: 9 Edema Assessment Assessed: [Left: No] [Right: No] Edema: [Left: Ye] [Right: s] Calf Left: Right: Point of Measurement: 35 cm From Medial Instep cm 26 cm Ankle Left: Right: Point of Measurement: 7 cm From Medial Instep  cm 23 cm Vascular Assessment Pulses: Dorsalis Pedis Palpable: [Right:Yes] Electronic Signature(s) Signed: 06/16/2019 4:55:48 PM By: Kela Millin Entered By: Kela Millin on 06/16/2019 15:25:18 -------------------------------------------------------------------------------- Multi Wound Chart Details Patient Name: Date of Service: Erin [name] 06/16/2019 3:30 PM Medical Record [id number] Patient Account Number: [id number] Date of Birth/Sex: Treating RN: 09-27-1930 (86 y.o. Erin [name] Primary Care Daunte Oestreich: [name] Other Clinician: Referring Jora Galluzzo: Treating Berlie Persky/Extender:Robson, [name], [name] Weeks in Treatment: 9 Vital Signs Height(in): 60 Pulse(bpm): 75 Weight(lbs): 130 Blood Pressure(mmHg): 161/59 Body Mass Index(BMI): 25 Temperature(F): 98.2 Respiratory 19 Rate(breaths/min): Photos: [6:No Photos] [N/A:N/A] Wound Location: [6:Right Lower Leg] [N/A:N/A] Wounding Event: [6:Gradually Appeared] [N/A:N/A] Primary Etiology: [6:Lymphedema] [  N/A:N/A] Date Acquired: [6:03/14/2019] [N/A:N/A] Weeks of Treatment: [6:9] [N/A:N/A] Wound Status: [6:Open] [N/A:N/A] Measurements L x W x D [6:11.5x12.5x0.1] [N/A:N/A] (cm) Area (cm) : [6:112.901] [N/A:N/A] Volume (cm) : [6:11.29] [N/A:N/A] % Reduction in Area: [6:15.90%] [N/A:N/A] % Reduction in Volume: [6:79.00%] [N/A:N/A] Classification: [6:Full Thickness Without Exposed Support Structures] [N/A:N/A] Procedures Performed: [6:Compression Therapy] [N/A:N/A] Treatment Notes Wound #6 (Right Lower Leg) [6:1. Cleanse With Wound Cleanser Soap and water 2. Periwound Care Barrier cream Moisturizing lotion TCA Cream 3. Primary Dressing Applied Other primary dressing (specifiy in notes) Hydrofera Blue 4. Secondary Dressing ABD Pad  Kerramax/Xtrasorb Other secondary dressing (specify in notes) 6. Support Layer Applied 3 layer compression wrap] Notes adaptic under hydrofera, secondary dressing  carboflex. netting. Electronic Signature(s) Signed: 06/16/2019 5:24:04 PM By: Linton Ham MD Entered By: Linton Ham on 06/16/2019 17:13:32 -------------------------------------------------------------------------------- Multi-Disciplinary Care Plan Details Patient Name: Date of Service: Erin [name] 06/16/2019 3:30 PM Medical Record [id number] Patient Account Number: [id number] Date of Birth/Sex: Treating RN: 1930-07-25 (86 y.o. Erin [name] Primary Care Kenasia Scheller: [name] Other Clinician: Referring Taylynn Easton: Treating Erykah Lippert/Extender:Robson, [name], [name] Weeks in Treatment: 9 Active Inactive Wound/Skin Impairment Nursing Diagnoses: Impaired tissue integrity Knowledge deficit related to ulceration/compromised skin integrity Goals: Patient/caregiver will verbalize understanding of skin care regimen Date Initiated: 04/14/2019 Target Resolution Date: 07/18/2019 Goal Status: Active Ulcer/skin breakdown will have a volume reduction of 30% by week 4 Date Initiated: 04/14/2019 Date Inactivated: 05/19/2019 Target Resolution Date: 05/16/2019 Goal Status: Met Ulcer/skin breakdown will have a volume reduction of 50% by week 8 Date Initiated: 05/19/2019 Date Inactivated: 06/16/2019 Target Resolution Date: 06/20/2019 Goal Status: Met Interventions: Assess patient/caregiver ability to obtain necessary supplies Assess patient/caregiver ability to perform ulcer/skin care regimen upon admission and as needed Assess ulceration(s) every visit Provide education on ulcer and skin care Notes: Electronic Signature(s) Signed: 06/16/2019 5:09:30 PM By: [name] RN, BSN Entered By: [name] on 06/16/2019 17:04:45 -------------------------------------------------------------------------------- Pain Assessment Details Patient Name: Date of Service: Erin [name] 06/16/2019 3:30 PM Medical Record [id number] Patient Account Number: [id number] Date of  Birth/Sex: Treating RN: Nov 05, 1930 (86 y.o. [name] Primary Care Avelyn Touch: [name] Other Clinician: Referring Dineen Conradt: Treating Alara Daniel/Extender:Robson, [name], [name] Weeks in Treatment: 9 Active Problems Location of Pain Severity and Description of Pain Patient Has Paino No Site Locations Pain Management and Medication Current Pain Management: Electronic Signature(s) Signed: 06/16/2019 4:55:48 PM By: Kela Millin Entered By: Kela Millin on 06/16/2019 15:25:06 -------------------------------------------------------------------------------- Patient/Caregiver Education Details Patient Name: Date of Service: Erin [name] 4/5/2021andnbsp3:30 PM Medical Record [id number] Patient Account Number: [id number] Date of Birth/Gender: Treating RN: 03/02/1931 (86 y.o. Erin [name] Primary Care Physician: [name] Other Clinician: Referring Physician: Treating Physician/Extender:Robson, [name], [name] in Treatment: 9 Education Assessment Education Provided To: Patient Education Topics Provided Wound/Skin Impairment: Methods: Explain/Verbal Responses: State content correctly Electronic Signature(s) Signed: 06/16/2019 5:09:30 PM By: [name] RN, BSN Entered By: [name] on 06/16/2019 17:04:55 -------------------------------------------------------------------------------- Wound Assessment Details Patient Name: Date of Service: Erin [name] 06/16/2019 3:30 PM Medical Record [id number] Patient Account Number: [id number] Date of Birth/Sex: Treating RN: 1930-06-28 (86 y.o. [name] Primary Care Maritsa Hunsucker: [name] Other Clinician: Referring Deasha Clendenin: Treating Audrey Thull/Extender:Robson, [name], [name] Weeks in Treatment: 9 Wound Status Wound Number: 6 Primary Etiology: Lymphedema Wound Location: Right Lower Leg Wound Status: Open Wounding Event: Gradually  Appeared Date Acquired: 03/14/2019 Weeks Of Treatment: 9 Clustered Wound: No Wound Measurements Length: (cm) 11.5 % Reduct Width: (cm) 12.5 % Reduct Depth: (  cm) 0.1 Area: (cm) 112.901 Volume: (cm) 11.29 Wound Description Classification: Full Thickness Without Exposed Support Structures ion in Area: 15.9% ion in Volume: 79% Treatment Notes Wound #6 (Right Lower Leg) 1. Cleanse With Wound Cleanser Soap and water 2. Periwound Care Barrier cream Moisturizing lotion TCA Cream 3. Primary Dressing Applied Other primary dressing (specifiy in notes) Hydrofera Blue 4. Secondary Dressing ABD Pad Kerramax/Xtrasorb Other secondary dressing (specify in notes) 6. Support Layer Applied 3 layer compression wrap Notes adaptic under hydrofera, secondary dressing carboflex. netting. Electronic Signature(s) Signed: 06/16/2019 4:55:48 PM By: Kela Millin Entered By: Kela Millin on 06/16/2019 15:25:35 -------------------------------------------------------------------------------- Vitals Details Patient Name: Date of Service: Erin [name] 06/16/2019 3:30 PM Medical Record [id number] Patient Account Number: [id number] Date of Birth/Sex: Treating RN: 11-07-1930 (86 y.o. [name] Primary Care Veasna Santibanez: [name] Other Clinician: Referring Analysia Dungee: Treating Arrow Emmerich/Extender:Robson, [name], [name] Weeks in Treatment: 9 Vital Signs Time Taken: 15:20 Temperature (F): 98.2 Height (in): 60 Pulse (bpm): 75 Weight (lbs): 130 Respiratory Rate (breaths/min): 19 Body Mass Index (BMI): 25.4 Blood Pressure (mmHg): 161/59 Reference Range: 80 - 120 mg / dl Electronic Signature(s) Signed: 06/16/2019 4:55:48 PM By: Kela Millin Entered By: Kela Millin on 06/16/2019 15:25:00

## 2019-06-25 NOTE — Progress Notes (Signed)
PADEN, KURAS ([id number]) Visit Report for 06/02/2019 Arrival Information Details Patient Name: Date of Service: AMSI, GRIMLEY 06/02/2019 3:30 PM Medical Record [id number] Patient Account Number: [id number] Date of Birth/Sex: Treating RN: May 16, 1930 (86 y.o. [name] Primary Care Keylen Uzelac: [name] Other Clinician: Referring Elta Angell: Treating Leeasia Secrist/Extender:Robson, [name], [name] in Treatment: 7 Visit Information History Since Last Visit Added or deleted any medications: No Patient Arrived: Wheel Chair Any new allergies or adverse reactions: No Arrival Time: 15:45 Had a fall or experienced change in No Accompanied By: caregiver activities of daily living that may affect Transfer Assistance: None risk of falls: Patient Identification Verified: Yes Signs or symptoms of abuse/neglect since last No Secondary Verification Process Yes visito Completed: Hospitalized since last visit: No Patient Requires Transmission- No Implantable device outside of the clinic excluding No Based Precautions: cellular tissue based products placed in the center Patient Has Alerts: Yes since last visit: Patient Alerts: NON COMPRESSABLE Has Dressing in Place as Prescribed: Yes Pain Present Now: No Electronic Signature(s) Signed: 06/25/2019 9:21:08 AM By: Sandre Kitty Entered By: Sandre Kitty on 06/02/2019 15:45:40 -------------------------------------------------------------------------------- Compression Therapy Details Patient Name: Date of Service: [name] 06/02/2019 3:30 PM Medical Record [id number] Patient Account Number: [id number] Date of Birth/Sex: Treating RN: Oct 24, 1930 (86 y.o. [name] Primary Care Seymour Pavlak: [name] Other Clinician: Referring Cristian Grieves: Treating Collis Thede/Extender:Robson, [name], [name] Weeks in Treatment: 7 Compression Therapy Performed for Wound Wound #6 Right Lower  Leg Assessment: Performed By: Clinician [name], RN Compression Type: Three Layer Post Procedure Diagnosis Same as Pre-procedure Electronic Signature(s) Signed: 06/02/2019 5:18:21 PM By: [name] RN, BSN Entered By: [name] on 06/02/2019 16:24:04 -------------------------------------------------------------------------------- Encounter Discharge Information Details Patient Name: Date of Service: [name] 06/02/2019 3:30 PM Medical Record [id number] Patient Account Number: [id number] Date of Birth/Sex: Treating RN: 1930-07-26 (86 y.o. [name] Primary Care Johnita Palleschi: [name] Other Clinician: Referring Natan Hartog: Treating Ewelina Naves/Extender:Robson, [name], [name] in Treatment: 7 Encounter Discharge Information Items Discharge Condition: Stable Ambulatory Status: Wheelchair Discharge Destination: Home Transportation: Private Auto Accompanied By: caregiver Schedule Follow-up Appointment: Yes Clinical Summary of Care: Electronic Signature(s) Signed: 06/02/2019 5:27:21 PM By: Deon Pilling Entered By: Deon Pilling on 06/02/2019 17:16:15 -------------------------------------------------------------------------------- Lower Extremity Assessment Details Patient Name: Date of Service: CAMEREN, ODWYER 06/02/2019 3:30 PM Medical Record [id number] Patient Account Number: [id number] Date of Birth/Sex: Treating RN: [birth year] (86 y.o. [name] Primary Care Daishawn Lauf: [name] Other Clinician: Referring Shayley Medlin: Treating Alucard Fearnow/Extender:Robson, [name], [name] Weeks in Treatment: 7 Edema Assessment Assessed: [Left: No] [Right: No] Edema: [Left: Ye] [Right: s] Calf Left: Right: Point of Measurement: 35 cm From Medial Instep cm 28 cm Ankle Left: Right: Point of Measurement: 7 cm From Medial Instep cm 23 cm Vascular Assessment Pulses: Dorsalis Pedis Palpable: [Right:Yes] Electronic  Signature(s) Signed: 06/02/2019 5:21:34 PM By: Kela Millin Entered By: Kela Millin on 06/02/2019 16:02:17 -------------------------------------------------------------------------------- Multi Wound Chart Details Patient Name: Date of Service: [name] 06/02/2019 3:30 PM Medical Record [id number] Patient Account Number: [id number] Date of Birth/Sex: Treating RN: 1930-11-05 (86 y.o. [name] Primary Care Dallas Torok: [name] Other Clinician: Referring Cedrick Partain: Treating [place]/Extender:Robson, [name], [name] Weeks in Treatment: 7 Vital Signs Height(in): 60 Pulse(bpm): 61 Weight(lbs): 130 Blood Pressure(mmHg): 178/67 Body Mass Index(BMI): 25 Temperature(F): 98.3 Respiratory 18 Rate(breaths/min): Photos: [6:No Photos] [N/A:N/A] Wound Location: [6:Right Lower Leg] [N/A:N/A] Wounding Event: [6:Gradually Appeared] [N/A:N/A] Primary Etiology: [6:Lymphedema] [N/A:N/A] Comorbid History: [6:Cataracts, Glaucoma, Middle ear problems, Anemia, Hypertension] [N/A:N/A]  Date Acquired: [6:03/14/2019] [N/A:N/A] Weeks of Treatment: [6:7] [N/A:N/A] Wound Status: [6:Open] [N/A:N/A] Measurements L x W x D 9x13x0.1 [N/A:N/A] (cm) Area (cm) : [6:91.892] [N/A:N/A] Volume (cm) : [6:9.189] [N/A:N/A] % Reduction in Area: [6:31.60%] [N/A:N/A] % Reduction in Volume: 82.90% [N/A:N/A] Classification: [6:Full Thickness Without Exposed Support Structures] [N/A:N/A] Exudate Amount: [6:Large] [N/A:N/A] Exudate Type: [6:Serosanguineous] [N/A:N/A] Exudate Color: [6:red, brown] [N/A:N/A] Wound Margin: [6:Well defined, not attached N/A] Granulation Amount: [6:Medium (34-66%)] [N/A:N/A] Granulation Quality: [6:Red, Pink] [N/A:N/A] Necrotic Amount: [6:Medium (34-66%)] [N/A:N/A] Exposed Structures: [6:Fat Layer (Subcutaneous N/A Tissue) Exposed: Yes Fascia: No Tendon: No Muscle: No Joint: No Bone: No] Epithelialization: [6:Small (1-33%) Compression Therapy]  [N/A:N/A N/A] Treatment Notes Wound #6 (Right Lower Leg) 1. Cleanse With Wound Cleanser Soap and water 2. Periwound Care Barrier cream Moisturizing lotion TCA Cream 3. Primary Dressing Applied Other primary dressing (specifiy in notes) Hydrofera Blue 4. Secondary Dressing ABD Pad Kerramax/Xtrasorb Other secondary dressing (specify in notes) 6. Support Layer Applied 3 layer compression wrap Notes adaptic under hydrofera, secondary dressing carboflex. netting. Electronic Signature(s) Signed: 06/02/2019 5:18:21 PM By: [name] RN, BSN Signed: 06/02/2019 5:34:39 PM By: Linton Ham MD Entered By: Linton Ham on 06/02/2019 17:17:52 -------------------------------------------------------------------------------- Multi-Disciplinary Care Plan Details Patient Name: Date of Service: SHANORA, CHRISTENSEN 06/02/2019 3:30 PM Medical Record [id number] Patient Account Number: [id number] Date of Birth/Sex: Treating RN: Apr 29, 1930 (86 y.o. [name] Primary Care Nelani Schmelzle: [name] Other Clinician: Referring Norlene Lanes: Treating Ameena Vesey/Extender:Robson, [name], [name] Weeks in Treatment: 7 Active Inactive Wound/Skin Impairment Nursing Diagnoses: Impaired tissue integrity Knowledge deficit related to ulceration/compromised skin integrity Goals: Patient/caregiver will verbalize understanding of skin care regimen Date Initiated: 04/14/2019 Target Resolution Date: 06/20/2019 Goal Status: Active Ulcer/skin breakdown will have a volume reduction of 30% by week 4 Date Initiated: 04/14/2019 Date Inactivated: 05/19/2019 Target Resolution Date: 05/16/2019 Goal Status: Met Ulcer/skin breakdown will have a volume reduction of 50% by week 8 Date Initiated: 05/19/2019 Target Resolution Date: 06/20/2019 Goal Status: Active Interventions: Assess patient/caregiver ability to obtain necessary supplies Assess patient/caregiver ability to perform ulcer/skin care regimen upon  admission and as needed Assess ulceration(s) every visit Provide education on ulcer and skin care Notes: Electronic Signature(s) Signed: 06/02/2019 5:18:21 PM By: [name] RN, BSN Entered By: [name] on 06/02/2019 15:51:42 -------------------------------------------------------------------------------- Pain Assessment Details Patient Name: Date of Service: [name] 06/02/2019 3:30 PM Medical Record [id number] Patient Account Number: [id number] Date of Birth/Sex: Treating RN: 08-23-1930 (86 y.o. [name] Primary Care Chastity Noland: [name] Other Clinician: Referring Elven Laboy: Treating Nataliee Shurtz/Extender:Robson, [name], [name] Weeks in Treatment: 7 Active Problems Location of Pain Severity and Description of Pain Patient Has Paino No Site Locations Pain Management and Medication Current Pain Management: Electronic Signature(s) Signed: 06/02/2019 5:18:21 PM By: [name] RN, BSN Signed: 06/25/2019 9:21:08 AM By: Sandre Kitty Entered By: Sandre Kitty on 06/02/2019 15:46:01 Patient/Caregiver Education Details Date of Service: -------------------------------------------------------------------------------- Venia Carbon 3/22/2021andnbsp3:30 PM Patient Name: A. Patient Account Number: [id number] Medical Record Treating RN: [name] [id number] Number: Other Clinician: Date of Birth/Gender: 10/06/1930 (86 y.o. F) Treating Linton Ham Primary Care Physician: [name] Physician/Extender: Referring Physician: Tomasa Blase in Treatment: 7 Education Assessment Education Provided To: Patient Education Topics Provided Wound/Skin Impairment: Methods: Explain/Verbal Responses: State content correctly Electronic Signature(s) Signed: 06/02/2019 5:18:21 PM By: [name] RN, BSN Entered By: [name] on 06/02/2019  15:51:52 -------------------------------------------------------------------------------- Wound Assessment Details Patient Name: Date of Service: [name] 06/02/2019 3:30 PM Medical Record [id number] Patient Account Number: [id number] Date  of Birth/Sex: Treating RN: 1930-05-22 (86 y.o. [name] Primary Care Marzetta Lanza: [name] Other Clinician: Referring Glorianna Gott: Treating Saliah Crisp/Extender:Robson, [name], [name] Weeks in Treatment: 7 Wound Status Wound Number: 6 Primary Lymphedema Etiology: Wound Location: Right Lower Leg Wound Open Wounding Event: Gradually Appeared Status: Date Acquired: 03/14/2019 Comorbid Cataracts, Glaucoma, Middle ear problems, Weeks Of Treatment: 7 History: Anemia, Hypertension Clustered Wound: No Photos Photo Uploaded By: Mikeal Hawthorne on 06/05/2019 13:16:32 Wound Measurements Length: (cm) 9 % Reduct Width: (cm) 13 % Reduct Depth: (cm) 0.1 Epitheli Area: (cm) 91.892 Tunneli Volume: (cm) 9.189 Undermi Wound Description Classification: Full Thickness Without Exposed Support Foul Odo Structures Slough/F Wound Well defined, not attached Margin: Exudate Large Amount: Exudate Serosanguineous Type: Exudate red, brown Color: Wound Bed Granulation Amount: Medium (34-66%) Granulation Quality: Red, Pink Fascia E Necrotic Amount: Medium (34-66%) Fat Laye Necrotic Quality: Adherent Slough Tendon E Muscle E Joint Ex Bone Exp Treatment Notes Wound #6 (Right Lower Leg) 1. Cleanse With Wound Cleanser Soap and water 2. Periwound Care Barrier cream Moisturizing lotion TCA Cream 3. Primary Dressing Applied Other primary dressing (specifiy in notes) Hydrofera Blue 4. Secondary Dressing ABD Pad Kerramax/Xtrasorb Other secondary dressing (specify in notes) 6. Support Layer Applied 3 layer compression wrap Notes adaptic under hydrofera, secondary dressing carboflex. netting. Electronic  Signature(s) Signed: 06/02/2019 5:18:21 PM By: [name] RN, BSN Signed: 06/02/2019 5:21:34 PM By: Kela Millin Entered By: Kela Millin on 03 r After Cleansing: No ibrino Yes Exposed Structure xposed: No r (Subcutaneous Tissue) Exposed: Yes xposed: No xposed: No posed: No osed: No /22/2021 16:02:40 ion in Area: 31.6% ion in Volume: 82.9% alization: Small (1-33%) ng: No ning: No -------------------------------------------------------------------------------- Vitals Details Patient Name: Date of Service: NATAYAH, WARMACK 06/02/2019 3:30 PM Medical Record [id number] Patient Account Number: [id number] Date of Birth/Sex: Treating RN: 03-02-1931 (86 y.o. [name] Primary Care Dmitri Pettigrew: [name] Other Clinician: Referring Mariabelen Pressly: Treating Ahja Martello/Extender:Robson, [name], [name] Weeks in Treatment: 7 Vital Signs Time Taken: 15:45 Temperature (F): 98.3 Height (in): 60 Pulse (bpm): 61 Weight (lbs): 130 Respiratory Rate (breaths/min): 18 Body Mass Index (BMI): 25.4 Blood Pressure (mmHg): 178/67 Reference Range: 80 - 120 mg / dl Electronic Signature(s) Signed: 06/25/2019 9:21:08 AM By: Sandre Kitty Entered By: Sandre Kitty on 06/02/2019 15:45:55

## 2019-06-30 ENCOUNTER — Other Ambulatory Visit: Payer: Self-pay

## 2019-06-30 ENCOUNTER — Encounter (HOSPITAL_BASED_OUTPATIENT_CLINIC_OR_DEPARTMENT_OTHER): Payer: Medicare HMO | Admitting: Internal Medicine

## 2019-06-30 DIAGNOSIS — L97812 Non-pressure chronic ulcer of other part of right lower leg with fat layer exposed: Secondary | ICD-10-CM | POA: Diagnosis not present

## 2019-06-30 NOTE — Progress Notes (Signed)
Erin [name], Erin [name] ([id number]) Visit Report for 06/30/2019 Arrival Information Details Patient Name: Date of Service: Erin [name], Erin [name] 06/30/2019 3:30 PM Medical Record [id number] Patient Account Number: [id number] Date of Birth/Sex: Treating RN: 1930-11-14 (86 y.o. [name] Primary Care Provider: [name] Other Clinician: Referring Provider: Treating Provider/Extender:Robson, [name], [name] in Treatment: 11 Visit Information History Since Last Visit Added or deleted any medications: No Patient Arrived: Wheel Chair Any new allergies or adverse reactions: No Arrival Time: 15:52 Had a fall or experienced change in No Accompanied By: family member activities of daily living that may affect Transfer Assistance: EasyPivot Patient risk of falls: Lift Signs or symptoms of abuse/neglect since last No Patient Identification Verified: Yes visito Secondary Verification Process Yes Hospitalized since last visit: No Completed: Implantable device outside of the clinic excluding No Patient Requires Transmission- No cellular tissue based products placed in the center Based Precautions: since last visit: Patient Has Alerts: Yes Has Dressing in Place as Prescribed: Yes Patient Alerts: NON COMPRESSABLE Has Compression in Place as Prescribed: Yes Pain Present Now: No Electronic Signature(s) Signed: 06/30/2019 5:36:36 PM By: Kela Millin Entered By: Kela Millin on 06/30/2019 15:53:01 -------------------------------------------------------------------------------- Compression Therapy Details Patient Name: Date of Service: Erin [name], Erin [name] 06/30/2019 3:30 PM Medical Record [id number] Patient Account Number: [id number] Date of Birth/Sex: Treating RN: [birth year] (86 y.o. [name] Primary Care Provider: [name] Other Clinician: Referring Provider: Treating Provider/Extender:Robson, [name], [name] in  Treatment: 11 Compression Therapy Performed for Wound Wound #6 Right Lower Leg Assessment: Performed By: Clinician [name], RN Compression Type: Three Layer Post Procedure Diagnosis Same as Pre-procedure Electronic Signature(s) Signed: 06/30/2019 6:02:31 PM By: [name] RN, BSN Entered By: [name] on 06/30/2019 16:48:07 -------------------------------------------------------------------------------- Encounter Discharge Information Details Patient Name: Date of Service: Erin [name] 06/30/2019 3:30 PM Medical Record [id number] Patient Account Number: [id number] Date of Birth/Sex: Treating RN: 06-20-1930 (86 y.o. [name] Primary Care Provider: [name] Other Clinician: Referring Provider: Treating Provider/Extender:Robson, [name], [name] in Treatment: 11 Encounter Discharge Information Items Discharge Condition: Stable Ambulatory Status: Wheelchair Discharge Destination: Home Transportation: Private Auto Accompanied By: family member Schedule Follow-up Appointment: Yes Clinical Summary of Care: Patient Declined Electronic Signature(s) Signed: 06/30/2019 5:36:36 PM By: Kela Millin Entered By: Kela Millin on 06/30/2019 17:20:12 -------------------------------------------------------------------------------- Lower Extremity Assessment Details Patient Name: Date of Service: Erin [name], Erin [name] 06/30/2019 3:30 PM Medical Record [id number] Patient Account Number: [id number] Date of Birth/Sex: Treating RN: 03/30/1930 (86 y.o. [name] Primary Care Provider: [name] Other Clinician: Referring Provider: Treating Provider/Extender:Robson, [name], [name] Weeks in Treatment: 11 Edema Assessment Assessed: [Left: No] [Right: No] Edema: [Left: Ye] [Right: s] Calf Left: Right: Point of Measurement: 35 cm From Medial Instep cm 31.3 cm Ankle Left: Right: Point of Measurement: 7 cm  From Medial Instep cm 23 cm Vascular Assessment Pulses: Dorsalis Pedis Palpable: [Right:Yes] Electronic Signature(s) Signed: 06/30/2019 5:36:36 PM By: Kela Millin Entered By: Kela Millin on 06/30/2019 15:54:07 -------------------------------------------------------------------------------- Multi Wound Chart Details Patient Name: Date of Service: Erin Carbon A. 06/30/2019 3:30 PM Medical Record [id number] Patient Account Number: [id number] Date of Birth/Sex: Treating RN: 02/22/1931 (86 y.o. [name] Primary Care Provider: [name] Other Clinician: Referring Provider: Treating Provider/Extender:Robson, [name], [name] Weeks in Treatment: 11 Vital Signs Height(in): 60 Pulse(bpm): 80 Weight(lbs): 130 Blood Pressure(mmHg): 169/94 Body Mass Index(BMI): 25 Temperature(F): 98.3 Respiratory 19 Rate(breaths/min): Photos: [6:No Photos] [N/A:N/A] Wound Location: [6:Right Lower Leg] [N/A:N/A] Wounding Event: [6:Gradually Appeared] [N/A:N/A] Primary  Etiology: [6:Lymphedema] [N/A:N/A] Comorbid History: [6:Cataracts, Glaucoma, Middle ear problems, Anemia, Hypertension] [N/A:N/A] Date Acquired: [6:03/14/2019] [N/A:N/A] Weeks of Treatment: [6:11] [N/A:N/A] Wound Status: [6:Open] [N/A:N/A] Measurements L x W x D 12x11x0.1 [N/A:N/A] (cm) Area (cm) : [2:353.614] [N/A:N/A] Volume (cm) : [6:10.367] [N/A:N/A] % Reduction in Area: [6:22.80%] [N/A:N/A] % Reduction in Volume: 80.70% [N/A:N/A] Classification: [6:Full Thickness Without Exposed Support Structures] [N/A:N/A] Exudate Amount: [6:Large] [N/A:N/A] Exudate Type: [6:Serosanguineous] [N/A:N/A] Exudate Color: [6:red, brown] [N/A:N/A] Wound Margin: [6:Distinct, outline attached N/A] Granulation Amount: [6:Large (67-100%)] [N/A:N/A] Granulation Quality: [6:Red, Pink] [N/A:N/A] Necrotic Amount: [6:Small (1-33%)] [N/A:N/A] Exposed Structures: [6:Fat Layer (Subcutaneous N/A Tissue) Exposed:  Yes] Epithelialization: [6:Small (1-33%) Compression Therapy] [N/A:N/A N/A] Treatment Notes Wound #6 (Right Lower Leg) 1. Cleanse With Wound Cleanser Soap and water 2. Periwound Care Barrier cream Moisturizing lotion 3. Primary Dressing Applied Calcium Alginate Ag Other primary dressing (specifiy in notes) 4. Secondary Dressing ABD Pad Kerramax/Xtrasorb 6. Support Layer Applied 3 layer compression wrap Notes adaptic under hydrofera, secondary dressing carboflex. netting. Electronic Signature(s) Signed: 06/30/2019 5:34:04 PM By: Linton Ham MD Signed: 06/30/2019 6:02:31 PM By: [name] RN, BSN Entered By: Linton Ham on 06/30/2019 17:21:09 -------------------------------------------------------------------------------- Multi-Disciplinary Care Plan Details Patient Name: Date of Service: Erin [name], Erin [name] 06/30/2019 3:30 PM Medical Record [id number] Patient Account Number: [id number] Date of Birth/Sex: Treating RN: Aug 22, 1930 (86 y.o. [name] Primary Care Shelagh Rayman: [name] Other Clinician: Referring Malek Skog: Treating Costas Sena/Extender:Robson, [name], [name] Weeks in Treatment: 11 Active Inactive Wound/Skin Impairment Nursing Diagnoses: Impaired tissue integrity Knowledge deficit related to ulceration/compromised skin integrity Goals: Patient/caregiver will verbalize understanding of skin care regimen Date Initiated: 04/14/2019 Target Resolution Date: 07/18/2019 Goal Status: Active Ulcer/skin breakdown will have a volume reduction of 30% by week 4 Date Initiated: 04/14/2019 Date Inactivated: 05/19/2019 Target Resolution Date: 05/16/2019 Goal Status: Met Ulcer/skin breakdown will have a volume reduction of 50% by week 8 Date Initiated: 05/19/2019 Date Inactivated: 06/16/2019 Target Resolution Date: 06/20/2019 Goal Status: Met Interventions: Assess patient/caregiver ability to obtain necessary supplies Assess patient/caregiver ability to  perform ulcer/skin care regimen upon admission and as needed Assess ulceration(s) every visit Provide education on ulcer and skin care Notes: Electronic Signature(s) Signed: 06/30/2019 6:02:31 PM By: [name] RN, BSN Entered By: [name] on 06/30/2019 17:58:44 -------------------------------------------------------------------------------- Pain Assessment Details Patient Name: Date of Service: Erin [name] 06/30/2019 3:30 PM Medical Record [id number] Patient Account Number: [id number] Date of Birth/Sex: Treating RN: 10-03-1930 (86 y.o. [name] Primary Care Cienna Dumais: [name] Other Clinician: Referring Albirtha Grinage: Treating Lakoda Raske/Extender:Robson, [name], [name] in Treatment: 11 Active Problems Location of Pain Severity and Description of Pain Patient Has Paino No Site Locations Pain Management and Medication Current Pain Management: Electronic Signature(s) Signed: 06/30/2019 5:36:36 PM By: Kela Millin Entered By: Kela Millin on 06/30/2019 15:53:40 Patient/Caregiver Education Details Date of Service: -------------------------------------------------------------------------------- Erin Carbon 4/19/2021andnbsp3:30 PM Patient Name: A. Patient Account Number: [id number] Medical Record Treating RN: [name] [id number] Number: Other Clinician: Date of Birth/Gender: 1930-07-04 (86 y.o. F) Treating Linton Ham Primary Care Physician: [name] Physician/Extender: Referring Physician: Tomasa Blase in Treatment: 11 Education Assessment Education Provided To: Patient Education Topics Provided Wound/Skin Impairment: Methods: Explain/Verbal Responses: State content correctly Electronic Signature(s) Signed: 06/30/2019 6:02:31 PM By: [name] RN, BSN Entered By: [name] on 06/30/2019 17:58:52 -------------------------------------------------------------------------------- Wound  Assessment Details Patient Name: Date of Service: Erin [name] 06/30/2019 3:30 PM Medical Record [id number] Patient Account Number: [id number] Date of Birth/Sex: Treating RN: [birth year] (86 y.o. F) Dwiggins, Larene Beach Primary  Care Provider: [name] Other Clinician: Referring Provider: Treating Provider/Extender:Robson, [name], [name] Weeks in Treatment: 11 Wound Status Wound Number: 6 Primary Lymphedema Etiology: Wound Location: Right Lower Leg Wound Open Wounding Event: Gradually Appeared Status: Date Acquired: 03/14/2019 Comorbid Cataracts, Glaucoma, Middle ear problems, Weeks Of Treatment: 11 History: Anemia, Hypertension Clustered Wound: No Wound Measurements Length: (cm) 12 % Reduct Width: (cm) 11 % Reduct Depth: (cm) 0.1 Epitheli Area: (cm) 103.673 Tunneli Volume: (cm) 10.367 Undermi Wound Description Classification: Full Thickness Without Exposed Support Foul Odo Structures Slough/F Wound Distinct, outline attached Margin: Exudate Large Amount: Exudate Serosanguineous Type: Exudate red, brown Color: Wound Bed Granulation Amount: Large (67-100%) Granulation Quality: Red, Pink Fat Laye Necrotic Amount: Small (1-33%) Necrotic Quality: Adherent Slough r After Cleansing: No ibrino Yes Exposed Structure r (Subcutaneous Tissue) Exposed: Yes ion in Area: 22.8% ion in Volume: 80.7% alization: Small (1-33%) ng: No ning: No Treatment Notes Wound #6 (Right Lower Leg) 1. Cleanse With Wound Cleanser Soap and water 2. Periwound Care Barrier cream Moisturizing lotion 3. Primary Dressing Applied Calcium Alginate Ag Other primary dressing (specifiy in notes) 4. Secondary Dressing ABD Pad Kerramax/Xtrasorb 6. Support Layer Applied 3 layer compression wrap Notes adaptic under hydrofera, secondary dressing carboflex. netting. Electronic Signature(s) Signed: 06/30/2019 5:36:36 PM By: Kela Millin Entered By: Kela Millin  on 06/30/2019 15:56:23 -------------------------------------------------------------------------------- Vitals Details Patient Name: Date of Service: Erin Carbon A. 06/30/2019 3:30 PM Medical Record [id number] Patient Account Number: [id number] Date of Birth/Sex: Treating RN: [birth year] (86 y.o. [name] Primary Care Provider: [name] Other Clinician: Referring Provider: Treating Provider/Extender:Robson, [name], [name] Weeks in Treatment: 11 Vital Signs Time Taken: 15:50 Temperature (F): 98.3 Height (in): 60 Pulse (bpm): 80 Weight (lbs): 130 Respiratory Rate (breaths/min): 19 Body Mass Index (BMI): 25.4 Blood Pressure (mmHg): 169/94 Reference Range: 80 - 120 mg / dl Electronic Signature(s) Signed: 06/30/2019 5:36:36 PM By: Kela Millin Entered By: Kela Millin on 06/30/2019 15:53:27

## 2019-07-01 NOTE — Progress Notes (Signed)
Erin [name], Erin [name] ([id number]) Visit Report for 06/30/2019 HPI Details Patient Name: Date of Service: Erin [name], Erin [name] 06/30/2019 3:30 PM Medical Record [id number] Patient Account Number: [id number] Date of Birth/Sex: Treating RN: 04/22/1930 (86 y.o. Erin [name] Primary Care Provider: [name] Other Clinician: Referring Provider: Treating Provider/Extender:Arsema Tusing, [name], [name] in Treatment: 11 History of Present Illness HPI Description: We to find a new wound on the left 03/22/17; this is an 86 year old woman. There is not a lot of information in care everywhere on this patient. She had a history of a malignant neoplasm her left breast for which she had lumpectomy but she did not have radiation. Apparently she has had long-standing edema and her lower legs and she was seen in the wound care center at [facility] in [place] by [name] October. Both her legs were wrapped however it sounds as though the left leg became secondarily infected she apparently had MRSA and she was admitted the hospital. Not really turn for wound care. She lives at home on her own and has apparently [facility] home health care. We are not exactly clear what [facility] is doing to her legs but it does not involve compression. She is not a diabetic. ABIs in our clinic were noncompressible bilaterally 03/30/17; this is a patient who has bilateral chronic venous insufficiency, severe venous inflammation. We had a nice description number legs from a note by [name] dating 2010 describing edematous warm legs with erythema. This suggests that she has chronic venous insufficiency with inflammation.. We have arterial studies dated 12/29/16 again from [facility]. This showed a right ABI at 1 and a TBI of 0.81 on the left the ABI was 1.25 and a TBI of 0.72. This was just she does not have significant arterial insufficiency. She had normal triphasic waveforms noted that she  was admitted to hospital in late October with MSSA cellulitis Last visit we wrapped both her legs. She had an open area predominantly on the left anterior but weeping edema on the right anterior leg. She has done well there is only one at anterior left tibial wound 04/06/17; the patient has no open area on her right leg. Her remaining wound is on the left anterior leg, the left posterior leg wound is healed. We have been using silver alginate changed to [facility] on the left leg today she has home health 04/13/17; still no open area on her right leg. Her remaining wound on the left anterior leg. We have been using using Hydrofera Blue 04/20/17; no open area on the right leg although the edema here is somewhat disfiguring. Her remaining wound is on the left anterior leg we've been using Hydrofera Blue with improvement in dimensions. She has [facility] home health and lives in [place]. She requests to come every 2 weeks because of the distance involved in coming here 05/04/17; there is no open area on the right leg. She has a very small left anterior leg wound that remains. However what remains on the left still has some depth and a not to viable looking surface. It is too small to really attempt debridement. She has home health coming out to her home in [facility] 05/18/17; there is no open area on either leg. She has significant chronic venous insufficiency. The left anterior leg wound that was still open 2 weeks ago has closed. She has home health coming out. We are going to order Juxtalite stockings for both legs. She is agreed to pay for  these privately. We will order them from [facility] READMISSION 04/14/2019 This is a patient we previously had in this clinic in 2019 for 3 months. She had a wound on the left anterior greater than right anterior leg. According to my notes we discharged her and juxta lite stockings although it does not appear that she ever got them. The history here is a bit  difficult. She apparently has had a wound on the left lower leg for the last 6 months. She required admission to hospital in [facility] for apparent cellulitis just before Christmas was discharged home she has an open wound. She has a home health agency although we are not exactly sure which one. They are applying silver alginate. No compression Past medical history is reviewed she has chronic venous insufficiency and a history of left breast CA. 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 She did not have any compression on the wounds today. 2/8; substantial wound on the right posterior calf and a smaller area on the left medial calf. We are using Iodoflex to both wound areas under compression. We have better edema control We to find a new wound on the left anterior medial calf 2/15; the area on the left medial calf is closed over. Substantial area on the right posterior and lateral calf. We have been using Iodoflex although the patient complains of pain and wonders if there is not an alternative. She has home health changing the dressing twice a week and she sees [place] once 2/22; the wound on the left medial calf is closed over. Her edema control is not good here but she cannot get on stockings. This substantial wound is on the right lateral and right posterior calf. Changed her to a hydrofera Blue last week. Gradual improvement in wound area. 3/8; wound on the left leg remains closed. The substantial wound is on the right lateral and right posterior calf. This is measuring smaller 3/22; 2-week follow-up. Substantial area on the right posterior and lateral calf measures slightly smaller. We are using Hydrofera Blue under compression 4/5; 2-week follow-up. Substantial area on the right posterior lateral calf. This measures slightly smaller. She has islands of  epithelialization we have been using Hydrofera Blue under compression 4/19; 2-week follow-up. We have been using Hydrofera Blue however there is maceration around the wound. I will change to silver alginate today. She has islands of epithelialization however I did not see much evidence that things were improving here. Electronic Signature(s) Signed: 06/30/2019 5:34:04 PM By: Linton Ham MD Entered By: Linton Ham on 06/30/2019 17:21:42 -------------------------------------------------------------------------------- Physical Exam Details Patient Name: Date of Service: Erin [name] 06/30/2019 3:30 PM Medical Record [id number] Patient Account Number: [id number] Date of Birth/Sex: Treating RN: Sep 12, 1930 (86 y.o. Erin [name] Primary Care Provider: [name] Other Clinician: Referring Provider: Treating Provider/Extender:Antwuan Eckley, [name], [name] Weeks in Treatment: 11 Cardiovascular Pedal pulses are palpable. Have good edema control. Notes Wound exam; I do not see much different from last week. The wound looked really moist and the epithelialized otherwise the areas were macerated I changed her to silver alginate. Still under the compression. Electronic Signature(s) Signed: 06/30/2019 5:34:04 PM By: Linton Ham MD Entered By: Linton Ham on 06/30/2019 17:23:02 -------------------------------------------------------------------------------- Physician Orders Details Patient Name: Date of Service: Erin [name] 06/30/2019 3:30 PM Medical Record [id number] Patient Account Number: [id number] Date of Birth/Sex:  Treating RN: [birth year] (86 y.o. Erin [name] Primary Care Provider: [name] Other Clinician: Referring Provider: Treating Provider/Extender:Traven Davids, [name], [name] in Treatment: 23 Verbal / Phone Orders: No Diagnosis Coding ICD-10 Coding Code Description I87.331 Chronic venous hypertension  (idiopathic) with ulcer and inflammation of right lower extremity L97.812 Non-pressure chronic ulcer of other part of right lower leg with fat layer exposed I89.0 Lymphedema, not elsewhere classified L97.821 Non-pressure chronic ulcer of other part of left lower leg limited to breakdown of skin Follow-up Appointments Return Appointment in 2 weeks. Dressing Change Frequency Wound #6 Right Lower Leg Change dressing three times week. - by home health. Home health to change 2x a week on week that patient has appt at wound clinic. Skin Barriers/Peri-Wound Care Barrier cream - to excoriated areas Moisturizing lotion Wound Cleansing May shower with protection. - use cast protector Primary Wound Dressing Wound #6 Right Lower Leg Calcium Alginate with Silver Secondary Dressing Wound #6 Right Lower Leg ABD pad Zetuvit or Kerramax - or Xtrasorb (or other superabsorbent dressing) Edema Control 3 Layer Compression System - Right Lower Extremity Avoid standing for long periods of time Elevate legs to the level of the heart or above for 30 minutes daily and/or when sitting, a frequency of: - throughout the day [place] skilled nursing for wound care. - [facility] Electronic Signature(s) Signed: 06/30/2019 5:34:04 PM By: Linton Ham MD Signed: 06/30/2019 6:02:31 PM By: [name] RN, BSN Entered By: [name] on 06/30/2019 16:47:16 -------------------------------------------------------------------------------- Problem List Details Patient Name: Date of Service: Erin [name] 06/30/2019 3:30 PM Medical Record [id number] Patient Account Number: [id number] Date of Birth/Sex: Treating RN: 12-Oct-1930 (86 y.o. Erin [name] Primary Care Provider: [name] Other Clinician: Referring Provider: Treating Provider/Extender:Chong January, [name], [name] in Treatment: 11 Active Problems ICD-10 Evaluated Encounter Code Description Active  Date Today Diagnosis I87.331 Chronic venous hypertension (idiopathic) with ulcer 04/14/2019 No Yes and inflammation of right lower extremity L97.812 Non-pressure chronic ulcer of other part of right lower 04/14/2019 No Yes leg with fat layer exposed I89.0 Lymphedema, not elsewhere classified 04/14/2019 No Yes L97.821 Non-pressure chronic ulcer of other part of left lower 04/21/2019 No Yes leg limited to breakdown of skin Inactive Problems Resolved Problems Electronic Signature(s) Signed: 06/30/2019 5:34:04 PM By: Linton Ham MD Entered By: Linton Ham on 06/30/2019 17:20:42 -------------------------------------------------------------------------------- Progress Note Details Patient Name: Date of Service: Erin [name] 06/30/2019 3:30 PM Medical Record [id number] Patient Account Number: [id number] Date of Birth/Sex: Treating RN: 05-09-1930 (86 y.o. Erin [name] Primary Care Provider: [name] Other Clinician: Referring Provider: Treating Provider/Extender:Arryana Tolleson, [name], [name] Weeks in Treatment: 11 Subjective History of Present Illness (HPI) We to find a new wound on the left 03/22/17; this is an 86 year old woman. There is not a lot of information in care everywhere on this patient. She had a history of a malignant neoplasm her left breast for which she had lumpectomy but she did not have radiation. Apparently she has had long-standing edema and her lower legs and she was seen in the wound care center at [facility] in [place] by [name] October. Both her legs were wrapped however it sounds as though the left leg became secondarily infected she apparently had MRSA and she was admitted the hospital. Not really turn for wound care. She lives at home on her own and has apparently [facility] home health care. We are not exactly clear what [facility] is doing to her legs but it does not  involve compression. She is not a diabetic. ABIs in our clinic  were noncompressible bilaterally 03/30/17; this is a patient who has bilateral chronic venous insufficiency, severe venous inflammation. We had a nice description number legs from a note by [name] dating 2010 describing edematous warm legs with erythema. This suggests that she has chronic venous insufficiency with inflammation.. We have arterial studies dated 12/29/16 again from [facility]. This showed a right ABI at 1 and a TBI of 0.81 on the left the ABI was 1.25 and a TBI of 0.72. This was just she does not have significant arterial insufficiency. She had normal triphasic waveforms noted that she was admitted to hospital in late October with MSSA cellulitis Last visit we wrapped both her legs. She had an open area predominantly on the left anterior but weeping edema on the right anterior leg. She has done well there is only one at anterior left tibial wound 04/06/17; the patient has no open area on her right leg. Her remaining wound is on the left anterior leg, the left posterior leg wound is healed. We have been using silver alginate changed to [facility] on the left leg today she has home health 04/13/17; still no open area on her right leg. Her remaining wound on the left anterior leg. We have been using using Hydrofera Blue 04/20/17; no open area on the right leg although the edema here is somewhat disfiguring. Her remaining wound is on the left anterior leg we've been using Hydrofera Blue with improvement in dimensions. She has [facility] home health and lives in [place]. She requests to come every 2 weeks because of the distance involved in coming here 05/04/17; there is no open area on the right leg. She has a very small left anterior leg wound that remains. However what remains on the left still has some depth and a not to viable looking surface. It is too small to really attempt debridement. She has home health coming out to her home in [facility] 05/18/17; there is no  open area on either leg. She has significant chronic venous insufficiency. The left anterior leg wound that was still open 2 weeks ago has closed. She has home health coming out. We are going to order Juxtalite stockings for both legs. She is agreed to pay for these privately. We will order them from [facility] READMISSION 04/14/2019 This is a patient we previously had in this clinic in 2019 for 3 months. She had a wound on the left anterior greater than right anterior leg. According to my notes we discharged her and juxta lite stockings although it does not appear that she ever got them. The history here is a bit difficult. She apparently has had a wound on the left lower leg for the last 6 months. She required admission to hospital in [facility] for apparent cellulitis just before Christmas was discharged home she has an open wound. She has a home health agency although we are not exactly sure which one. They are applying silver alginate. No compression Past medical history is reviewed she has chronic venous insufficiency and a history of left breast CA. 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 She did not have any compression on the wounds today. 2/8; substantial wound on the right posterior calf and a smaller area on the left medial calf. We are  using Iodoflex to both wound areas under compression. We have better edema control We to find a new wound on the left anterior medial calf 2/15; the area on the left medial calf is closed over. Substantial area on the right posterior and lateral calf. We have been using Iodoflex although the patient complains of pain and wonders if there is not an alternative. She has home health changing the dressing twice a week and she sees [place] once 2/22; the wound on the left medial calf is closed over. Her edema control is not good here but  she cannot get on stockings. This substantial wound is on the right lateral and right posterior calf. Changed her to a hydrofera Blue last week. Gradual improvement in wound area. 3/8; wound on the left leg remains closed. The substantial wound is on the right lateral and right posterior calf. This is measuring smaller 3/22; 2-week follow-up. Substantial area on the right posterior and lateral calf measures slightly smaller. We are using Hydrofera Blue under compression 4/5; 2-week follow-up. Substantial area on the right posterior lateral calf. This measures slightly smaller. She has islands of epithelialization we have been using Hydrofera Blue under compression 4/19; 2-week follow-up. We have been using Hydrofera Blue however there is maceration around the wound. I will change to silver alginate today. She has islands of epithelialization however I did not see much evidence that things were improving here. Objective Constitutional Vitals Time Taken: 3:50 PM, Height: 60 in, Weight: 130 lbs, BMI: 25.4, Temperature: 98.3 F, Pulse: 80 bpm, Respiratory Rate: 19 breaths/min, Blood Pressure: 169/94 mmHg. Cardiovascular Pedal pulses are palpable. Have good edema control. General Notes: Wound exam; I do not see much different from last week. The wound looked really moist and the epithelialized otherwise the areas were macerated I changed her to silver alginate. Still under the compression. Integumentary (Hair, Skin) Wound #6 status is Open. Original cause of wound was Gradually Appeared. The wound is located on the Right Lower Leg. The wound measures 12cm length x 11cm width x 0.1cm depth; 103.673cm^2 area and 10.367cm^3 volume. There is Fat Layer (Subcutaneous Tissue) Exposed exposed. There is no tunneling or undermining noted. There is a large amount of serosanguineous drainage noted. The wound margin is distinct with the outline attached to the wound base. There is large (67-100%) red, pink  granulation within the wound bed. There is a small (1-33%) amount of necrotic tissue within the wound bed including Adherent Slough. 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 There was a Three Layer Compression Therapy Procedure by [name], RN. Post procedure Diagnosis Wound #6: Same as Pre-Procedure Plan Follow-up Appointments: Return Appointment in 2 weeks. Dressing Change Frequency: Wound #6 Right Lower Leg: Change dressing three times week. - by home health. Home health to change 2x a week on week that patient has appt at wound clinic. Skin Barriers/Peri-Wound Care: Barrier cream - to excoriated areas Moisturizing lotion Wound Cleansing: May shower with protection. - use cast protector Primary Wound Dressing: Wound #6 Right Lower Leg: Calcium Alginate with Silver Secondary Dressing: Wound #6 Right Lower Leg: ABD pad Zetuvit or Kerramax - or Xtrasorb (or other  superabsorbent dressing) Edema Control: 3 Layer Compression System - Right Lower Extremity Avoid standing for long periods of time Elevate legs to the level of the heart or above for 30 minutes daily and/or when sitting, a frequency of: - throughout the day Home Health: [facility] skilled nursing for wound care. - [facility] 1. I change the primary dressing to silver alginate Kerramax under 3 layer compression 2. Circumference of the wound looked macerated skin looked irritated which was the reason for the change. 3. I see no evidence of infection 4. She is tolerating 3 layer compression without complaints.  Dressings are being changed by home health Electronic Signature(s) Signed: 06/30/2019 5:34:04 PM By: Linton Ham MD Entered By: Linton Ham on 06/30/2019 17:24:42 -------------------------------------------------------------------------------- SuperBill Details Patient Name: Date of Service: Erin [name] 06/30/2019 Medical Record [id number] Patient Account Number: [id number] Date of Birth/Sex: Treating RN: [birth year] (86 y.o. Erin [name] Primary Care Provider: [name] Other Clinician: Referring Provider: Treating Provider/Extender:Derral Colucci, [name], [name] in Treatment: 11 Diagnosis Coding ICD-10 Codes Code Description I87.331 Chronic venous hypertension (idiopathic) with ulcer and inflammation of right lower extremity L97.812 Non-pressure chronic ulcer of other part of right lower leg with fat layer exposed I89.0 Lymphedema, not elsewhere classified L97.821 Non-pressure chronic ulcer of other part of left lower leg limited to breakdown of skin Facility Procedures CPT4 Code Description: [id number] (Facility Use Only) [phone] - APPLY [place] RT LEG Modifier: Quantity: 1 Physician Procedures CPT4 Code Description: [id number] - WC PHYS LEVEL 3 - EST PT ICD-10 Diagnosis Description I87.331 Chronic venous hypertension (idiopathic) with ulcer and in lower extremity L97.812 Non-pressure chronic ulcer of other part of right lower le I89.0  Lymphedema, not elsewhere classified Modifier: flammation of ri g with fat layer Quantity: 1 ght exposed Electronic Signature(s) Signed: 06/30/2019 6:02:31 PM By: [name] RN, BSN Signed: 07/01/2019 5:47:38 PM By: Linton Ham MD Previous Signature: 06/30/2019 5:34:04 PM Version By: Linton Ham MD Entered By: [name] on 06/30/2019 17:59:09

## 2019-07-17 ENCOUNTER — Encounter (HOSPITAL_BASED_OUTPATIENT_CLINIC_OR_DEPARTMENT_OTHER): Payer: Medicare HMO | Attending: Internal Medicine | Admitting: Internal Medicine

## 2019-07-17 DIAGNOSIS — Z8614 Personal history of Methicillin resistant Staphylococcus aureus infection: Secondary | ICD-10-CM | POA: Diagnosis not present

## 2019-07-17 DIAGNOSIS — Z853 Personal history of malignant neoplasm of breast: Secondary | ICD-10-CM | POA: Insufficient documentation

## 2019-07-17 DIAGNOSIS — I890 Lymphedema, not elsewhere classified: Secondary | ICD-10-CM | POA: Diagnosis not present

## 2019-07-17 DIAGNOSIS — I872 Venous insufficiency (chronic) (peripheral): Secondary | ICD-10-CM | POA: Insufficient documentation

## 2019-07-17 DIAGNOSIS — L97821 Non-pressure chronic ulcer of other part of left lower leg limited to breakdown of skin: Secondary | ICD-10-CM | POA: Diagnosis not present

## 2019-07-17 DIAGNOSIS — L97812 Non-pressure chronic ulcer of other part of right lower leg with fat layer exposed: Secondary | ICD-10-CM | POA: Insufficient documentation

## 2019-07-28 ENCOUNTER — Encounter (HOSPITAL_BASED_OUTPATIENT_CLINIC_OR_DEPARTMENT_OTHER): Payer: Medicare HMO | Admitting: Internal Medicine

## 2019-07-28 ENCOUNTER — Other Ambulatory Visit: Payer: Self-pay

## 2019-07-28 DIAGNOSIS — I872 Venous insufficiency (chronic) (peripheral): Secondary | ICD-10-CM | POA: Diagnosis not present

## 2019-07-28 NOTE — Progress Notes (Signed)
PAULENE, TAYAG ([id number]) Visit Report for 07/28/2019 Arrival Information Details Patient Name: Date of Service: Erin [name] [name] 07/28/2019 3:30 PM Medical Record Number: [id number] Patient Account Number: [id number] Date of Birth/Sex: Treating RN: Mar 30, 1930 (86 y.o. [name] Primary Care Lanyah Spengler: Henderson Cloud NE, A NGELA Other Clinician: Referring Aadan Chenier: Treating Conlin Brahm/Extender: Maudie Flakes NE, A NGELA Weeks in Treatment: 15 Visit Information History Since Last Visit Added or deleted any medications: No Patient Arrived: Wheel Chair Any new allergies or adverse reactions: No Arrival Time: 16:04 Had a fall or experienced change in No Accompanied By: caregiver activities of daily living that may affect Transfer Assistance: EasyPivot Patient Lift risk of falls: Patient Identification Verified: Yes Signs or symptoms of abuse/neglect since last visito No Secondary Verification Process Completed: Yes Hospitalized since last visit: No Patient Requires Transmission-Based Precautions: No Implantable device outside of the clinic excluding No Patient Has Alerts: Yes cellular tissue based products placed in the center Patient Alerts: NON COMPRESSABLE since last visit: Has Dressing in Place as Prescribed: Yes Has Compression in Place as Prescribed: Yes Pain Present Now: No Electronic Signature(s) Signed: 07/28/2019 5:05:18 PM By: Kela Millin Entered By: Kela Millin on 07/28/2019 16:05:13 -------------------------------------------------------------------------------- Compression Therapy Details Patient Name: Date of Service: Erin [name] [name] 07/28/2019 3:30 PM Medical Record Number: [id number] Patient Account Number: [id number] Date of Birth/Sex: Treating RN: 21-Oct-1930 (86 y.o. [name] Primary Care Lirio Bach: Henderson Cloud NE, A NGELA Other Clinician: Referring Jenasis Straley: Treating Bawi Lakins/Extender: Maudie Flakes NE, A  NGELA Weeks in Treatment: 15 Compression Therapy Performed for Wound Assessment: Wound #6 Right Lower Leg Performed By: Clinician [name], RN Compression Type: Three Layer Post Procedure Diagnosis Same as Pre-procedure Electronic Signature(s) Signed: 07/28/2019 5:54:52 PM By: [name] RN, BSN Entered By: [name] on 07/28/2019 16:21:30 -------------------------------------------------------------------------------- Encounter Discharge Information Details Patient Name: Date of Service: Erin [name] [name] 07/28/2019 3:30 PM Medical Record Number: [id number] Patient Account Number: [id number] Date of Birth/Sex: Treating RN: January 13, 1931 (86 y.o. [name] Primary Care Vincent Streater: Henderson Cloud NE, A NGELA Other Clinician: Referring Annya Lizana: Treating Tobi Leinweber/Extender: Maudie Flakes NE, A NGELA Weeks in Treatment: 15 Encounter Discharge Information Items Discharge Condition: Stable Ambulatory Status: Wheelchair Discharge Destination: Home Transportation: Private Auto Accompanied By: caregiver Schedule Follow-up Appointment: Yes Clinical Summary of Care: Electronic Signature(s) Signed: 07/28/2019 5:02:28 PM By: Deon Pilling Entered By: Deon Pilling on 07/28/2019 17:00:57 -------------------------------------------------------------------------------- Lower Extremity Assessment Details Patient Name: Date of Service: Erin [name] [name] 07/28/2019 3:30 PM Medical Record Number: [id number] Patient Account Number: [id number] Date of Birth/Sex: Treating RN: [birth year] (86 y.o. [name] Primary Care Orlena Garmon: Henderson Cloud NE, A NGELA Other Clinician: Referring Breslin Burklow: Treating Benjy Kana/Extender: Maudie Flakes NE, A NGELA Weeks in Treatment: 15 Edema Assessment Assessed: [Left: No] [Right: Yes] Edema: [Left: Ye] [Right: s] Calf Left: Right: Point of Measurement: 35 cm From Medial Instep cm 29 cm Ankle Left: Right: Point of  Measurement: 7 cm From Medial Instep cm 22.5 cm Vascular Assessment Pulses: Dorsalis Pedis Palpable: [Right:Yes] Electronic Signature(s) Signed: 07/28/2019 5:05:18 PM By: Kela Millin Entered By: Kela Millin on 07/28/2019 16:08:15 -------------------------------------------------------------------------------- Multi Wound Chart Details Patient Name: Date of Service: Erin [name] [name] 07/28/2019 3:30 PM Medical Record Number: [id number] Patient Account Number: [id number] Date of Birth/Sex: Treating RN: 17-Apr-1930 (86 y.o. [name] Primary Care Shaynna Husby: Henderson Cloud NE, A NGELA Other Clinician: Referring  Grason Brailsford: Treating Jalayne Ganesh/Extender: Maudie Flakes NE, A NGELA Weeks in Treatment: 15 Vital Signs Height(in): 60 Pulse(bpm): 66 Weight(lbs): 130 Blood Pressure(mmHg): 152/69 Body Mass Index(BMI): 25 Temperature(F): 97.6 Respiratory Rate(breaths/min): 18 Photos: [6:No Photos Right Lower Leg] [N/A:N/A N/A] Wound Location: [6:Gradually Appeared] [N/A:N/A] Wounding Event: [6:Lymphedema] [N/A:N/A] Primary Etiology: [6:Cataracts, Glaucoma, Middle ear] [N/A:N/A] Comorbid History: [6:problems, Anemia, Hypertension 03/14/2019] [N/A:N/A] Date Acquired: [6:15] [N/A:N/A] Weeks of Treatment: [6:Open] [N/A:N/A] Wound Status: [6:9.5x10.5x0.1] [N/A:N/A] Measurements L x W x D (cm) [6:78.343] [N/A:N/A] A (cm) : rea [6:7.834] [N/A:N/A] Volume (cm) : [6:41.70%] [N/A:N/A] % Reduction in Area: [6:85.40%] [N/A:N/A] % Reduction in Volume: [6:Full Thickness Without Exposed] [N/A:N/A] Classification: [6:Support Structures Large] [N/A:N/A] Exudate Amount: [6:Serosanguineous] [N/A:N/A] Exudate Type: [6:red, brown] [N/A:N/A] Exudate Color: [6:Distinct, outline attached] [N/A:N/A] Wound Margin: [6:Large (67-100%)] [N/A:N/A] Granulation Amount: [6:Red, Pink] [N/A:N/A] Granulation Quality: [6:Small (1-33%)] [N/A:N/A] Necrotic Amount: [6:Fat Layer (Subcutaneous Tissue)]  [N/A:N/A] Exposed Structures: [6:Exposed: Yes Fascia: No Tendon: No Muscle: No Joint: No Bone: No Medium (34-66%)] [N/A:N/A] Epithelialization: [6:Compression Therapy] [N/A:N/A] Treatment Notes Electronic Signature(s) Signed: 07/28/2019 4:51:55 PM By: Linton Ham MD Signed: 07/28/2019 5:54:52 PM By: [name] RN, BSN Entered By: Linton Ham on 07/28/2019 16:45:57 -------------------------------------------------------------------------------- Multi-Disciplinary Care Plan Details Patient Name: Date of Service: Erin [name] [name] 07/28/2019 3:30 PM Medical Record Number: [id number] Patient Account Number: [id number] Date of Birth/Sex: Treating RN: June 25, 1930 (86 y.o. [name] Primary Care Berlyn Saylor: Henderson Cloud NE, A NGELA Other Clinician: Referring Jeanpaul Biehl: Treating Keyarra Rendall/Extender: Maudie Flakes NE, A NGELA Weeks in Treatment: 15 Active Inactive Wound/Skin Impairment Nursing Diagnoses: Impaired tissue integrity Knowledge deficit related to ulceration/compromised skin integrity Goals: Patient/caregiver will verbalize understanding of skin care regimen Date Initiated: 04/14/2019 Target Resolution Date: 09/12/2019 Goal Status: Active Ulcer/skin breakdown will have a volume reduction of 30% by week 4 Date Initiated: 04/14/2019 Date Inactivated: 05/19/2019 Target Resolution Date: 05/16/2019 Goal Status: Met Ulcer/skin breakdown will have a volume reduction of 50% by week 8 Date Initiated: 05/19/2019 Date Inactivated: 06/16/2019 Target Resolution Date: 06/20/2019 Goal Status: Met Interventions: Assess patient/caregiver ability to obtain necessary supplies Assess patient/caregiver ability to perform ulcer/skin care regimen upon admission and as needed Assess ulceration(s) every visit Provide education on ulcer and skin care Notes: Electronic Signature(s) Signed: 07/28/2019 5:54:52 PM By: [name] RN, BSN Entered By: [name] on 07/28/2019  17:24:03 -------------------------------------------------------------------------------- Pain Assessment Details Patient Name: Date of Service: Erin [name] [name] 07/28/2019 3:30 PM Medical Record Number: [id number] Patient Account Number: [id number] Date of Birth/Sex: Treating RN: 12/09/1930 (86 y.o. [name] Primary Care Bruce Churilla: Henderson Cloud NE, A NGELA Other Clinician: Referring Rexford Prevo: Treating Ranveer Wahlstrom/Extender: Maudie Flakes NE, A NGELA Weeks in Treatment: 15 Active Problems Location of Pain Severity and Description of Pain Patient Has Paino No Site Locations Rate the pain. Rate the pain. Current Pain Level: 0 Pain Management and Medication Current Pain Management: Medication: No Cold Application: No Rest: No Massage: No Activity: No T.E.N.S.: No Heat Application: No Leg drop or elevation: No Is the Current Pain Management Adequate: Adequate How does your wound impact your activities of daily livingo Sleep: No Bathing: No Appetite: No Relationship With Others: No Bladder Continence: No Emotions: No Bowel Continence: No Work: No Toileting: No Drive: No Dressing: No Hobbies: No Electronic Signature(s) Signed: 07/28/2019 5:05:18 PM By: Kela Millin Entered By: Kela Millin on 07/28/2019 16:07:36 -------------------------------------------------------------------------------- Patient/Caregiver Education Details Patient Name: Date of Service: Erin [name] [name] 5/17/2021andnbsp3:30 PM Medical  Record Number: [id number] Patient Account Number: [id number] Date of Birth/Gender: Treating RN: [birth year] (86 y.o. [name] Primary Care Physician: Henderson Cloud NE, A NGELA Other Clinician: Referring Physician: Treating Physician/Extender: Maudie Flakes NE, A NGELA Weeks in Treatment: 15 Education Assessment Education Provided To: Patient Education Topics Provided Wound/Skin Impairment: Methods:  Explain/Verbal Responses: State content correctly Electronic Signature(s) Signed: 07/28/2019 5:54:52 PM By: [name] RN, BSN Entered By: [name] on 07/28/2019 17:24:11 -------------------------------------------------------------------------------- Wound Assessment Details Patient Name: Date of Service: Erin [name] [name] 07/28/2019 3:30 PM Medical Record Number: [id number] Patient Account Number: [id number] Date of Birth/Sex: Treating RN: [birth year] (86 y.o. [name] Primary Care Jayan Raymundo: Henderson Cloud NE, A NGELA Other Clinician: Referring Rhyanna Sorce: Treating Latrise Bowland/Extender: Maudie Flakes NE, A NGELA Weeks in Treatment: 15 Wound Status Wound Number: 6 Primary Lymphedema Etiology: Wound Location: Right Lower Leg Wound Status: Open Wounding Event: Gradually Appeared Comorbid Cataracts, Glaucoma, Middle ear problems, Anemia, Date Acquired: 03/14/2019 History: Hypertension Weeks Of Treatment: 15 Clustered Wound: No Wound Measurements Length: (cm) 9.5 Width: (cm) 10.5 Depth: (cm) 0.1 Area: (cm) 78.343 Volume: (cm) 7.834 % Reduction in Area: 41.7% % Reduction in Volume: 85.4% Epithelialization: Medium (34-66%) Tunneling: No Undermining: No Wound Description Classification: Full Thickness Without Exposed Support Structures Wound Margin: Distinct, outline attached Exudate Amount: Large Exudate Type: Serosanguineous Exudate Color: red, brown Foul Odor After Cleansing: No [name]/Fibrino Yes Wound Bed Granulation Amount: Large (67-100%) Exposed Structure Granulation Quality: Red, Pink Fascia Exposed: No Necrotic Amount: Small (1-33%) Fat Layer (Subcutaneous Tissue) Exposed: Yes Necrotic Quality: Adherent [name] Tendon Exposed: No Muscle Exposed: No Joint Exposed: No Bone Exposed: No Treatment Notes Wound #6 (Right Lower Leg) 1. Cleanse With Wound Cleanser Soap and water 2. Periwound Care Barrier cream Moisturizing lotion TCA  Cream 3. Primary Dressing Applied Other primary dressing (specifiy in notes) Hydrofera Blue 4. Secondary Dressing ABD Pad Kerramax/Xtrasorb 6. Support Layer Applied 3 layer compression wrap Notes adaptic under hydrofera. Electronic Signature(s) Signed: 07/28/2019 5:05:18 PM By: Kela Millin Entered By: Kela Millin on 07/28/2019 16:08:55 -------------------------------------------------------------------------------- Vitals Details Patient Name: Date of Service: Erin [name] [name] 07/28/2019 3:30 PM Medical Record Number: [id number] Patient Account Number: [id number] Date of Birth/Sex: Treating RN: 10-14-1930 (86 y.o. [name] Primary Care Stephanny Tsutsui: Henderson Cloud NE, A NGELA Other Clinician: Referring Krishawna Stiefel: Treating Thailyn Khalid/Extender: Maudie Flakes NE, A NGELA Weeks in Treatment: 15 Vital Signs Time Taken: 16:00 Temperature (F): 97.6 Height (in): 60 Pulse (bpm): 66 Weight (lbs): 130 Respiratory Rate (breaths/min): 18 Body Mass Index (BMI): 25.4 Blood Pressure (mmHg): 152/69 Reference Range: 80 - 120 mg / dl Electronic Signature(s) Signed: 07/28/2019 5:05:18 PM By: Kela Millin Entered By: Kela Millin on 07/28/2019 16:06:02

## 2019-07-31 NOTE — Progress Notes (Signed)
VANA, ARIF ([id number]) Visit Report for 07/28/2019 HPI Details Patient Name: Date of Service: Erin [name] [name] 07/28/2019 3:30 PM Medical Record Number: [id number] Patient Account Number: [id number] Date of Birth/Sex: Treating RN: 1930/08/31 (86 y.o. [name] Primary Care Provider: Henderson Cloud NE, A NGELA Other Clinician: Referring Provider: Treating Provider/Extender: Maudie Flakes NE, A NGELA Weeks in Treatment: 15 History of Present Illness HPI Description: We to find a new wound on the left 03/22/17; this is an 86 year old woman. There is not a lot of information in care everywhere on this patient. She had a history of a malignant neoplasm her left breast for which she had lumpectomy but she did not have radiation. Apparently she has had long- standing edema and her lower legs and she was seen in the wound care center at [facility] in [place] by [name] October. Both her legs were wrapped however it sounds as though the left leg became secondarily infected she apparently had MRSA and she was admitted the hospital. Not really turn for wound care. She lives at home on her own and has apparently [facility] home health care. We are not exactly clear what [facility] is doing to her legs but it does not involve compression. She is not a diabetic. ABIs in our clinic were noncompressible bilaterally 03/30/17; this is a patient who has bilateral chronic venous insufficiency, severe venous inflammation. We had a nice description number legs from a note by [name] dating 2010 describing edematous warm legs with erythema. This suggests that she has chronic venous insufficiency with inflammation.. We have arterial studies dated 12/29/16 again from [facility]. This showed a right ABI at 1 and a TBI of 0.81 on the left the ABI was 1.25 and a TBI of 0.72. This was just she does not have significant arterial insufficiency. She had normal triphasic waveforms noted that  she was admitted to hospital in late October with MSSA cellulitis Last visit we wrapped both her legs. She had an open area predominantly on the left anterior but weeping edema on the right anterior leg. She has done well there is only one at anterior left tibial wound 04/06/17; the patient has no open area on her right leg. Her remaining wound is on the left anterior leg, the left posterior leg wound is healed. We have been using silver alginate changed to [facility] on the left leg today she has home health 04/13/17; still no open area on her right leg. Her remaining wound on the left anterior leg. We have been using using Hydrofera Blue 04/20/17; no open area on the right leg although the edema here is somewhat disfiguring. Her remaining wound is on the left anterior leg we've been using Hydrofera Blue with improvement in dimensions. She has [facility] home health and lives in [place]. She requests to come every 2 weeks because of the distance involved in coming here 05/04/17; there is no open area on the right leg. She has a very small left anterior leg wound that remains. However what remains on the left still has some depth and a not to viable looking surface. It is too small to really attempt debridement. She has home health coming out to her home in [facility] 05/18/17; there is no open area on either leg. She has significant chronic venous insufficiency. The left anterior leg wound that was still open 2 weeks ago has closed. She has home health coming out. We are going to  order Juxtalite stockings for both legs. She is agreed to pay for these privately. We will order them from [facility] READMISSION 04/14/2019 This is a patient we previously had in this clinic in 2019 for 3 months. She had a wound on the left anterior greater than right anterior leg. According to my notes we discharged her and juxta lite stockings although it does not appear that she ever got them. The history here is a bit  difficult. She apparently has had a wound on the left lower leg for the last 6 months. She required admission to hospital in [facility] for apparent cellulitis just before Christmas was discharged home she has an open wound. She has a home health agency although we are not exactly sure which one. They are applying silver alginate. No compression Past medical history is reviewed she has chronic venous insufficiency and a history of left breast CA. 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 She did not have any compression on the wounds today. 2/8; substantial wound on the right posterior calf and a smaller area on the left medial calf. We are using Iodoflex to both wound areas under compression. We have better edema control We to find a new wound on the left anterior medial calf 2/15; the area on the left medial calf is closed over. Substantial area on the right posterior and lateral calf. We have been using Iodoflex although the patient complains of pain and wonders if there is not an alternative. She has home health changing the dressing twice a week and she sees [place] once 2/22; the wound on the left medial calf is closed over. Her edema control is not good here but she cannot get on stockings. This substantial wound is on the right lateral and right posterior calf. Changed her to a hydrofera Blue last week. Gradual improvement in wound area. 3/8; wound on the left leg remains closed. The substantial wound is on the right lateral and right posterior calf. This is measuring smaller 3/22; 2-week follow-up. Substantial area on the right posterior and lateral calf measures slightly smaller. We are using Hydrofera Blue under compression 4/5; 2-week follow-up. Substantial area on the right posterior lateral calf. This measures slightly smaller. She has islands of epithelialization we  have been using Hydrofera Blue under compression 4/19; 2-week follow-up. We have been using Hydrofera Blue however there is maceration around the wound. I will change to silver alginate today. She has islands of epithelialization however I did not see much evidence that things were improving here. 5/6; 2-week follow-up. I put silver alginate on this because of maceration around the wound last time. However she comes in today with a lot of very adherent debris on the wound of the right lateral leg. 5/17; 2-week follow-up. Using Hydrofera Blue under compression. The patient has epithelialization including islands of epithelialization in the middle of fairly substantial wound area on the right posterior lateral calf Electronic Signature(s) Signed: 07/28/2019 4:51:55 PM By: Linton Ham MD Entered By: Linton Ham on 07/28/2019 16:46:37 -------------------------------------------------------------------------------- Physical Exam Details Patient Name: Date of Service: Erin [name] [name] 07/28/2019 3:30 PM Medical Record Number: [id number] Patient Account Number: [id number] Date of Birth/Sex: Treating RN: [birth year] (86 y.o. [name] Primary Care Provider: Henderson Cloud NE, A NGELA Other Clinician: Referring Provider: Treating Provider/Extender: Maudie Flakes NE, A NGELA  Weeks in Treatment: 15 Constitutional Patient is hypertensive.. Pulse regular and within target range for patient.Marland Kitchen Respirations regular, non-labored and within target range.. Temperature is normal and within the target range for the patient.Marland Kitchen Appears in no distress. Cardiovascular Pedal pulses are palpable. We have excellent edema control. Integumentary (Hair, Skin) No evidence of surrounding infection. Notes Wound exam; surface of the wound looks generally healthy. There is some debris on the surface and some parts of this large wound I elected not to debride this today. She has epithelialization in the  middle of the wound area in several locations hopefully this will expand Electronic Signature(s) Signed: 07/28/2019 4:51:55 PM By: Linton Ham MD Entered By: Linton Ham on 07/28/2019 16:47:44 -------------------------------------------------------------------------------- Physician Orders Details Patient Name: Date of Service: Erin [name] [name] 07/28/2019 3:30 PM Medical Record Number: [id number] Patient Account Number: [id number] Date of Birth/Sex: Treating RN: 11/29/1930 (86 y.o. [name] Primary Care Provider: Henderson Cloud NE, A NGELA Other Clinician: Referring Provider: Treating Provider/Extender: Maudie Flakes NE, A NGELA Weeks in Treatment: 15 Verbal / Phone Orders: No Diagnosis Coding ICD-10 Coding Code Description I87.331 Chronic venous hypertension (idiopathic) with ulcer and inflammation of right lower extremity L97.812 Non-pressure chronic ulcer of other part of right lower leg with fat layer exposed I89.0 Lymphedema, not elsewhere classified L97.821 Non-pressure chronic ulcer of other part of left lower leg limited to breakdown of skin Follow-up Appointments Return Appointment in 2 weeks. Dressing Change Frequency Wound #6 Right Lower Leg Change dressing three times week. - by home health. Home health to change 2x a week on week that patient has appt at wound clinic. Skin Barriers/Peri-Wound Care Barrier cream - to excoriated areas Moisturizing lotion Wound Cleansing May shower with protection. - use cast protector Primary Wound Dressing Wound #6 Right Lower Leg Hydrofera Blue - adaptic under hydrofera blue. Secondary Dressing Wound #6 Right Lower Leg ABD pad Zetuvit or Kerramax - or Xtrasorb (or other superabsorbent dressing) Edema Control 3 Layer Compression System - Right Lower Extremity Avoid standing for long periods of time Elevate legs to the level of the heart or above for 30 minutes daily and/or when sitting, a frequency of: -  throughout the day [place] skilled nursing for wound care. - [facility] Electronic Signature(s) Signed: 07/28/2019 4:51:55 PM By: Linton Ham MD Signed: 07/28/2019 5:54:52 PM By: [name] RN, BSN Entered By: [name] on 07/28/2019 16:20:58 -------------------------------------------------------------------------------- Problem List Details Patient Name: Date of Service: Erin [name] [name] 07/28/2019 3:30 PM Medical Record Number: [id number] Patient Account Number: [id number] Date of Birth/Sex: Treating RN: 1930-07-20 (86 y.o. [name] Primary Care Provider: Henderson Cloud NE, A NGELA Other Clinician: Referring Provider: Treating Provider/Extender: Maudie Flakes NE, A NGELA Weeks in Treatment: 15 Active Problems ICD-10 Encounter Code Description Active Date MDM Diagnosis I87.331 Chronic venous hypertension (idiopathic) with ulcer and inflammation of right 04/14/2019 No Yes lower extremity L97.812 Non-pressure chronic ulcer of other part of right lower leg with fat layer 04/14/2019 No Yes exposed I89.0 Lymphedema, not elsewhere classified 04/14/2019 No Yes L97.821 Non-pressure chronic ulcer of other part of left lower leg limited to breakdown 04/21/2019 No Yes of skin Inactive Problems Resolved Problems Electronic Signature(s) Signed: 07/28/2019 4:51:55 PM By: Linton Ham MD Entered By: Linton Ham on 07/28/2019 16:45:52 -------------------------------------------------------------------------------- Progress Note Details Patient Name: Date of Service: Erin [name] [name] 07/28/2019 3:30 PM Medical Record Number: [id number] Patient Account Number: [id number] Date  of Birth/Sex: Treating RN: 19-Sep-1930 (86 y.o. [name] Primary Care Provider: Henderson Cloud NE, A NGELA Other Clinician: Referring Provider: Treating Provider/Extender: Maudie Flakes NE, A NGELA Weeks in Treatment: 15 Subjective History of Present  Illness (HPI) We to find a new wound on the left 03/22/17; this is an 86 year old woman. There is not a lot of information in care everywhere on this patient. She had a history of a malignant neoplasm her left breast for which she had lumpectomy but she did not have radiation. Apparently she has had long-standing edema and her lower legs and she was seen in the wound care center at [facility] in [place] by [name] October. Both her legs were wrapped however it sounds as though the left leg became secondarily infected she apparently had MRSA and she was admitted the hospital. Not really turn for wound care. She lives at home on her own and has apparently [facility] home health care. We are not exactly clear what [facility] is doing to her legs but it does not involve compression. She is not a diabetic. ABIs in our clinic were noncompressible bilaterally 03/30/17; this is a patient who has bilateral chronic venous insufficiency, severe venous inflammation. We had a nice description number legs from a note by [name] dating 2010 describing edematous warm legs with erythema. This suggests that she has chronic venous insufficiency with inflammation.. We have arterial studies dated 12/29/16 again from [facility]. This showed a right ABI at 1 and a TBI of 0.81 on the left the ABI was 1.25 and a TBI of 0.72. This was just she does not have significant arterial insufficiency. She had normal triphasic waveforms noted that she was admitted to hospital in late October with MSSA cellulitis Last visit we wrapped both her legs. She had an open area predominantly on the left anterior but weeping edema on the right anterior leg. She has done well there is only one at anterior left tibial wound 04/06/17; the patient has no open area on her right leg. Her remaining wound is on the left anterior leg, the left posterior leg wound is healed. We have been using silver alginate changed to [facility] on the  left leg today she has home health 04/13/17; still no open area on her right leg. Her remaining wound on the left anterior leg. We have been using using Hydrofera Blue 04/20/17; no open area on the right leg although the edema here is somewhat disfiguring. Her remaining wound is on the left anterior leg we've been using Hydrofera Blue with improvement in dimensions. She has [facility] home health and lives in [place]. She requests to come every 2 weeks because of the distance involved in coming here 05/04/17; there is no open area on the right leg. She has a very small left anterior leg wound that remains. However what remains on the left still has some depth and a not to viable looking surface. It is too small to really attempt debridement. She has home health coming out to her home in [facility] 05/18/17; there is no open area on either leg. She has significant chronic venous insufficiency. The left anterior leg wound that was still open 2 weeks ago has closed. She has home health coming out. We are going to order Juxtalite stockings for both legs. She is agreed to pay for these privately. We will order them from [facility] READMISSION 04/14/2019 This is a patient we previously had in this clinic in  2019 for 3 months. She had a wound on the left anterior greater than right anterior leg. According to my notes we discharged her and juxta lite stockings although it does not appear that she ever got them. The history here is a bit difficult. She apparently has had a wound on the left lower leg for the last 6 months. She required admission to hospital in [facility] for apparent cellulitis just before Christmas was discharged home she has an open wound. She has a home health agency although we are not exactly sure which one. They are applying silver alginate. No compression Past medical history is reviewed she has chronic venous insufficiency and a history of left breast CA. She also has a history of MRSA in the  wound ABIs were not done in the clinic today however she did have noninvasive arterial studies in 2018 which are actually quite good. She did not have any compression on the wounds today. 2/8; substantial wound on the right posterior calf and a smaller area on the left medial calf. We are using Iodoflex to both wound areas under compression. We have better edema control We to find a new wound on the left anterior medial calf 2/15; the area on the left medial calf is closed over. Substantial area on the right posterior and lateral calf. We have been using Iodoflex although the patient complains of pain and wonders if there is not an alternative. She has home health changing the dressing twice a week and she sees [place] once 2/22; the wound on the left medial calf is closed over. Her edema control is not good here but she cannot get on stockings. This substantial wound is on the right lateral and right posterior calf. Changed her to a hydrofera Blue last week. Gradual improvement in wound area. 3/8; wound on the left leg remains closed. The substantial wound is on the right lateral and right posterior calf. This is measuring smaller 3/22; 2-week follow-up. Substantial area on the right posterior and lateral calf measures slightly smaller. We are using Hydrofera Blue under compression 4/5; 2-week follow-up. Substantial area on the right posterior lateral calf. This measures slightly smaller. She has islands of epithelialization we have been using Hydrofera Blue under compression 4/19; 2-week follow-up. We have been using Hydrofera Blue however there is maceration around the wound. I will change to silver alginate today. She has islands of epithelialization however I did not see much evidence that things were improving here. 5/6; 2-week follow-up. I put silver alginate on this because of maceration around the wound last time. However she comes in today with a lot of very adherent debris on the wound of the  right lateral leg. 5/17; 2-week follow-up. Using Hydrofera Blue under compression. The patient has epithelialization including islands of epithelialization in the middle of fairly substantial wound area on the right posterior lateral calf Objective Constitutional Patient is hypertensive.. Pulse regular and within target range for patient.Marland Kitchen Respirations regular, non-labored and within target range.. Temperature is normal and within the target range for the patient.Marland Kitchen Appears in no distress. Vitals Time Taken: 4:00 PM, Height: 60 in, Weight: 130 lbs, BMI: 25.4, Temperature: 97.6 F, Pulse: 66 bpm, Respiratory Rate: 18 breaths/min, Blood Pressure: 152/69 mmHg. Cardiovascular Pedal pulses are palpable. We have excellent edema control. General Notes: Wound exam; surface of the wound looks generally healthy. There is some debris on the surface and some parts of this large wound I elected not to debride this today. She has epithelialization in  the middle of the wound area in several locations hopefully this will expand Integumentary (Hair, Skin) No evidence of surrounding infection. Wound #6 status is Open. Original cause of wound was Gradually Appeared. The wound is located on the Right Lower Leg. The wound measures 9.5cm length x 10.5cm width x 0.1cm depth; 78.343cm^2 area and 7.834cm^3 volume. There is Fat Layer (Subcutaneous Tissue) Exposed exposed. There is no tunneling or undermining noted. There is a large amount of serosanguineous drainage noted. The wound margin is distinct with the outline attached to the wound base. There is large (67-100%) red, pink granulation within the wound bed. There is a small (1-33%) amount of necrotic tissue within the wound bed including Adherent [name]. Assessment Active Problems ICD-10 Chronic venous hypertension (idiopathic) with ulcer and inflammation of right lower extremity Non-pressure chronic ulcer of other part of right lower leg with fat layer  exposed Lymphedema, not elsewhere classified Non-pressure chronic ulcer of other part of left lower leg limited to breakdown of skin Procedures Wound #6 Pre-procedure diagnosis of Wound #6 is a Lymphedema located on the Right Lower Leg . There was a Three Layer Compression Therapy Procedure by [name], RN. Post procedure Diagnosis Wound #6: Same as Pre-Procedure Plan Follow-up Appointments: Return Appointment in 2 weeks. Dressing Change Frequency: Wound #6 Right Lower Leg: Change dressing three times week. - by home health. Home health to change 2x a week on week that patient has appt at wound clinic. Skin Barriers/Peri-Wound Care: Barrier cream - to excoriated areas Moisturizing lotion Wound Cleansing: May shower with protection. - use cast protector Primary Wound Dressing: Wound #6 Right Lower Leg: Hydrofera Blue - adaptic under hydrofera blue. Secondary Dressing: Wound #6 Right Lower Leg: ABD pad Zetuvit or Kerramax - or Xtrasorb (or other superabsorbent dressing) Edema Control: 3 Layer Compression System - Right Lower Extremity Avoid standing for long periods of time Elevate legs to the level of the heart or above for 30 minutes daily and/or when sitting, a frequency of: - throughout the day Home Health: [place] skilled nursing for wound care. - [facility] 1. I am going to continue with Hydrofera Blue/Xtrasorb/ABD under 3 layer compression. 2. She has home health changing the dressing everything looks satisfactory here Electronic Signature(s) Signed: 07/28/2019 4:51:55 PM By: Linton Ham MD Entered By: Linton Ham on 07/28/2019 16:48:24 -------------------------------------------------------------------------------- SuperBill Details Patient Name: Date of Service: [name], CHA [name] 07/28/2019 Medical Record Number: [id number] Patient Account Number: [id number] Date of Birth/Sex: Treating RN: 1930/05/21 (86 y.o. [name] Primary  Care Provider: Henderson Cloud NE, A NGELA Other Clinician: Referring Provider: Treating Provider/Extender: Maudie Flakes NE, A NGELA Weeks in Treatment: 15 Diagnosis Coding ICD-10 Codes Code Description I87.331 Chronic venous hypertension (idiopathic) with ulcer and inflammation of right lower extremity L97.812 Non-pressure chronic ulcer of other part of right lower leg with fat layer exposed I89.0 Lymphedema, not elsewhere classified L97.821 Non-pressure chronic ulcer of other part of left lower leg limited to breakdown of skin Facility Procedures CPT4 Code: [id number] Description: (Facility Use Only) [id number] - [place] COMPRS LWR RT LEG Modifier: Quantity: 1 Physician Procedures : CPT4 Code Description Modifier [id number] - WC PHYS LEVEL 3 - EST PT ICD-10 Diagnosis Description I87.331 Chronic venous hypertension (idiopathic) with ulcer and inflammation of right lower extremity L97.812 Non-pressure chronic ulcer of other part  of right lower leg with fat layer exposed Quantity: 1 Electronic Signature(s) Signed: 07/28/2019 5:54:52 PM By: [name] RN, BSN Signed: 07/31/2019  12:52:55 PM By: Linton Ham MD Previous Signature: 07/28/2019 4:51:55 PM Version By: Linton Ham MD Entered By: [name] on 07/28/2019 17:24:40

## 2019-08-04 NOTE — Progress Notes (Signed)
MISSIE, GEHRIG ([id number]) Visit Report for 07/17/2019 Arrival Information Details Patient Name: Date of Service: Erin [name] [name] 07/17/2019 3:30 PM Medical Record Number: [id number] Patient Account Number: [id number] Date of Birth/Sex: Treating RN: June 09, 1930 (86 y.o. [name] Primary Care Alyx Mcguirk: Henderson Cloud NE, A NGELA Other Clinician: Referring Amarya Kuehl: Treating Marcial Pless/Extender: Maudie Flakes NE, A NGELA Weeks in Treatment: 13 Visit Information History Since Last Visit Added or deleted any medications: No Patient Arrived: Wheel Chair Any new allergies or adverse reactions: No Arrival Time: 15:45 Had a fall or experienced change in No Accompanied By: caregiver activities of daily living that may affect Transfer Assistance: None risk of falls: Patient Identification Verified: Yes Signs or symptoms of abuse/neglect since last visito No Secondary Verification Process Completed: Yes Hospitalized since last visit: No Patient Requires Transmission-Based Precautions: No Implantable device outside of the clinic excluding No Patient Has Alerts: Yes cellular tissue based products placed in the center Patient Alerts: NON COMPRESSABLE since last visit: Has Dressing in Place as Prescribed: Yes Has Compression in Place as Prescribed: Yes Pain Present Now: No Electronic Signature(s) Signed: 07/17/2019 5:16:23 PM By: [name] RN, BSN Entered By: [name] on 07/17/2019 15:45:40 -------------------------------------------------------------------------------- Compression Therapy Details Patient Name: Date of Service: Erin [name] [name] 07/17/2019 3:30 PM Medical Record Number: [id number] Patient Account Number: [id number] Date of Birth/Sex: Treating RN: 1930-05-14 (86 y.o. [name] Primary Care Casmere Hollenbeck: Henderson Cloud NE, A NGELA Other Clinician: Referring Makya Phillis: Treating Haze Antillon/Extender: Maudie Flakes NE, A NGELA Weeks in Treatment:  13 Compression Therapy Performed for Wound Assessment: Wound #6 Right Lower Leg Performed By: Clinician [name], RN Compression Type: Three Layer Post Procedure Diagnosis Same as Pre-procedure Electronic Signature(s) Signed: 07/17/2019 5:34:26 PM By: Deon Pilling Entered By: Deon Pilling on 07/17/2019 16:44:15 -------------------------------------------------------------------------------- Encounter Discharge Information Details Patient Name: Date of Service: Erin [name] [name] 07/17/2019 3:30 PM Medical Record Number: [id number] Patient Account Number: [id number] Date of Birth/Sex: Treating RN: 1930/05/12 (86 y.o. [name] Primary Care Nyeisha Goodall: Henderson Cloud NE, A NGELA Other Clinician: Referring Jachelle Fluty: Treating Dontavious Emily/Extender: Maudie Flakes NE, A NGELA Weeks in Treatment: 13 Encounter Discharge Information Items Post Procedure Vitals Discharge Condition: Stable Temperature (F): 98.1 Ambulatory Status: Wheelchair Pulse (bpm): 62 Discharge Destination: Home Respiratory Rate (breaths/min): 18 Transportation: Private Auto Blood Pressure (mmHg): 130/40 Accompanied By: alone Schedule Follow-up Appointment: Yes Clinical Summary of Care: Patient Declined Electronic Signature(s) Signed: 07/17/2019 5:16:23 PM By: [name] RN, BSN Entered By: [name] on 07/17/2019 17:11:08 -------------------------------------------------------------------------------- Lower Extremity Assessment Details Patient Name: Date of Service: Erin [name] [name] 07/17/2019 3:30 PM Medical Record Number: [id number] Patient Account Number: [id number] Date of Birth/Sex: Treating RN: 1931-01-07 (86 y.o. [name] Primary Care Tannie Koskela: Henderson Cloud NE, A NGELA Other Clinician: Referring Walida Cajas: Treating Carisma Troupe/Extender: Maudie Flakes NE, A NGELA Weeks in Treatment: 13 Edema Assessment Assessed: [Left: No] [Right: No] Edema: [Left: Ye] [Right:  s] Calf Left: Right: Point of Measurement: 35 cm From Medial Instep cm 31.3 cm Ankle Left: Right: Point of Measurement: 7 cm From Medial Instep cm 23 cm Vascular Assessment Pulses: Dorsalis Pedis Palpable: [Right:Yes] Electronic Signature(s) Signed: 07/17/2019 5:16:23 PM By: [name] RN, BSN Entered By: [name] on 07/17/2019 15:54:00 -------------------------------------------------------------------------------- Multi Wound Chart Details Patient Name: Date of Service: Erin [name] [name] 07/17/2019 3:30 PM Medical Record Number: [id number] Patient Account Number: [id number] Date  of Birth/Sex: Treating RN: [birth year] (86 y.o. [name], [name] Primary Care Lilla Callejo: Henderson Cloud NE, A NGELA Other Clinician: Referring Sherleen Pangborn: Treating Baylyn Sickles/Extender: Maudie Flakes NE, A NGELA Weeks in Treatment: 13 Vital Signs Height(in): 60 Pulse(bpm): 62 Weight(lbs): 130 Blood Pressure(mmHg): 130/40 Body Mass Index(BMI): 25 Temperature(F): 98.1 Respiratory Rate(breaths/min): 18 Photos: [6:No Photos Right Lower Leg] [N/A:N/A N/A] Wound Location: [6:Gradually Appeared] [N/A:N/A] Wounding Event: [6:Lymphedema] [N/A:N/A] Primary Etiology: [6:Cataracts, Glaucoma, Middle ear] [N/A:N/A] Comorbid History: [6:problems, Anemia, Hypertension 03/14/2019] [N/A:N/A] Date Acquired: [6:13] [N/A:N/A] Weeks of Treatment: [6:Open] [N/A:N/A] Wound Status: [6:9.5x12x0.1] [N/A:N/A] Measurements L x W x D (cm) [6:89.535] [N/A:N/A] A (cm) : rea [6:8.954] [N/A:N/A] Volume (cm) : [6:33.30%] [N/A:N/A] % Reduction in A [6:rea: 83.30%] [N/A:N/A] % Reduction in Volume: [6:Full Thickness Without Exposed] [N/A:N/A] Classification: [6:Support Structures Large] [N/A:N/A] Exudate A mount: [6:Serosanguineous] [N/A:N/A] Exudate Type: [6:red, brown] [N/A:N/A] Exudate Color: [6:Distinct, outline attached] [N/A:N/A] Wound Margin: [6:Medium (34-66%)] [N/A:N/A] Granulation A mount: [6:Red, Pink]  [N/A:N/A] Granulation Quality: [6:Medium (34-66%)] [N/A:N/A] Necrotic A mount: [6:Fat Layer (Subcutaneous Tissue)] [N/A:N/A] Exposed Structures: [6:Exposed: Yes Fascia: No Tendon: No Muscle: No Joint: No Bone: No Small (1-33%)] [N/A:N/A] Epithelialization: [6:Debridement - Excisional] [N/A:N/A] Debridement: Pre-procedure Verification/Time Out 16:35 [N/A:N/A] Taken: [6:Lidocaine 4% Topical Solution] [N/A:N/A] Pain Control: [6:Subcutaneous, [name]] [N/A:N/A] Tissue Debrided: [6:Skin/Subcutaneous Tissue] [N/A:N/A] Level: [6:114] [N/A:N/A] Debridement A (sq cm): [6:rea Curette] [N/A:N/A] Instrument: [6:Minimum] [N/A:N/A] Bleeding: [6:Pressure] [N/A:N/A] Hemostasis A chieved: [6:0] [N/A:N/A] Procedural Pain: [6:3] [N/A:N/A] Post Procedural Pain: [6:Procedure was tolerated well] [N/A:N/A] Debridement Treatment Response: [6:9.5x12x0.1] [N/A:N/A] Post Debridement Measurements L x W x D (cm) [6:8.954] [N/A:N/A] Post Debridement Volume: (cm) [6:Compression Therapy] [N/A:N/A] Procedures Performed: [6:Debridement] Treatment Notes Electronic Signature(s) Signed: 07/17/2019 5:30:42 PM By: Linton Ham MD Signed: 08/04/2019 1:34:25 PM By: Deon Pilling Signed: 08/04/2019 1:34:25 PM By: Deon Pilling Entered By: Linton Ham on 07/17/2019 16:47:10 -------------------------------------------------------------------------------- Multi-Disciplinary Care Plan Details Patient Name: Date of Service: Erin [name] [name] 07/17/2019 3:30 PM Medical Record Number: [id number] Patient Account Number: [id number] Date of Birth/Sex: Treating RN: May 30, 1930 (86 y.o. [name], [name] Primary Care Mead Slane: Henderson Cloud NE, A NGELA Other Clinician: Referring Kaiyah Eber: Treating Brodie Correll/Extender: Maudie Flakes NE, A NGELA Weeks in Treatment: 13 Active Inactive Wound/Skin Impairment Nursing Diagnoses: Impaired tissue integrity Knowledge deficit related to ulceration/compromised skin  integrity Goals: Patient/caregiver will verbalize understanding of skin care regimen Date Initiated: 04/14/2019 Target Resolution Date: 09/12/2019 Goal Status: Active Ulcer/skin breakdown will have a volume reduction of 30% by week 4 Date Initiated: 04/14/2019 Date Inactivated: 05/19/2019 Target Resolution Date: 05/16/2019 Goal Status: Met Ulcer/skin breakdown will have a volume reduction of 50% by week 8 Date Initiated: 05/19/2019 Date Inactivated: 06/16/2019 Target Resolution Date: 06/20/2019 Goal Status: Met Interventions: Assess patient/caregiver ability to obtain necessary supplies Assess patient/caregiver ability to perform ulcer/skin care regimen upon admission and as needed Assess ulceration(s) every visit Provide education on ulcer and skin care Notes: Electronic Signature(s) Signed: 07/17/2019 5:34:26 PM By: Deon Pilling Signed: 08/04/2019 1:34:25 PM By: Deon Pilling Entered By: Deon Pilling on 07/17/2019 16:39:33 -------------------------------------------------------------------------------- Pain Assessment Details Patient Name: Date of Service: Erin [name] [name] 07/17/2019 3:30 PM Medical Record Number: [id number] Patient Account Number: [id number] Date of Birth/Sex: Treating RN: [birth year] (86 y.o. [name] Primary Care Emelly Wurtz: Henderson Cloud NE, A NGELA Other Clinician: Referring Ramanda Paules: Treating Correne Lalani/Extender: Maudie Flakes NE, A NGELA Weeks in Treatment: 13 Active Problems Location of Pain Severity and Description of Pain Patient Has Paino No Site Locations  Pain Management and Medication Current Pain Management: Electronic Signature(s) Signed: 07/17/2019 5:16:23 PM By: [name] RN, BSN Entered By: [name] on 07/17/2019 15:48:48 -------------------------------------------------------------------------------- Patient/Caregiver Education Details Patient Name: Date of Service: Erin [name] [name] 5/6/2021andnbsp3:30 PM Medical  Record Number: [id number] Patient Account Number: [id number] Date of Birth/Gender: Treating RN: 1930-12-01 (86 y.o. [name] Primary Care Physician: Henderson Cloud NE, A NGELA Other Clinician: Referring Physician: Treating Physician/Extender: Maudie Flakes NE, A NGELA Weeks in Treatment: 13 Education Assessment Education Provided To: Patient Education Topics Provided Wound/Skin Impairment: Handouts: Skin Care Do's and Dont's Methods: Explain/Verbal Responses: Reinforcements needed Electronic Signature(s) Signed: 07/17/2019 5:34:26 PM By: Deon Pilling Entered By: Deon Pilling on 07/17/2019 16:39:43 -------------------------------------------------------------------------------- Wound Assessment Details Patient Name: Date of Service: Erin [name] [name] 07/17/2019 3:30 PM Medical Record Number: [id number] Patient Account Number: [id number] Date of Birth/Sex: Treating RN: Jul 14, 1930 (86 y.o. [name] Primary Care Stella Bortle: Henderson Cloud NE, A NGELA Other Clinician: Referring Crystalynn Mcinerney: Treating Shawnell Dykes/Extender: Maudie Flakes NE, A NGELA Weeks in Treatment: 13 Wound Status Wound Number: 6 Primary Lymphedema Etiology: Wound Location: Right Lower Leg Wound Status: Open Wounding Event: Gradually Appeared Comorbid Cataracts, Glaucoma, Middle ear problems, Anemia, Date Acquired: 03/14/2019 History: Hypertension Weeks Of Treatment: 13 Clustered Wound: No Wound Measurements Length: (cm) 9.5 Width: (cm) 12 Depth: (cm) 0.1 Area: (cm) 89.535 Volume: (cm) 8.954 % Reduction in Area: 33.3% % Reduction in Volume: 83.3% Epithelialization: Small (1-33%) Tunneling: No Undermining: No Wound Description Classification: Full Thickness Without Exposed Support Structures Wound Margin: Distinct, outline attached Exudate Amount: Large Exudate Type: Serosanguineous Exudate Color: red, brown Foul Odor After Cleansing: No [name]/Fibrino Yes Wound Bed Granulation  Amount: Medium (34-66%) Exposed Structure Granulation Quality: Red, Pink Fascia Exposed: No Necrotic Amount: Medium (34-66%) Fat Layer (Subcutaneous Tissue) Exposed: Yes Necrotic Quality: Adherent [name] Tendon Exposed: No Muscle Exposed: No Joint Exposed: No Bone Exposed: No Electronic Signature(s) Signed: 07/17/2019 5:16:23 PM By: [name] RN, BSN Entered By: [name] on 07/17/2019 15:53:41 -------------------------------------------------------------------------------- Vitals Details Patient Name: Date of Service: Erin [name] [name] 07/17/2019 3:30 PM Medical Record Number: [id number] Patient Account Number: [id number] Date of Birth/Sex: Treating RN: [birth year] (86 y.o. [name] Primary Care Zeola Brys: Henderson Cloud NE, A NGELA Other Clinician: Referring Dillion Stowers: Treating Corrinna Karapetyan/Extender: Maudie Flakes NE, A NGELA Weeks in Treatment: 13 Vital Signs Time Taken: 15:47 Temperature (F): 98.1 Height (in): 60 Pulse (bpm): 62 Weight (lbs): 130 Respiratory Rate (breaths/min): 18 Body Mass Index (BMI): 25.4 Blood Pressure (mmHg): 130/40 Reference Range: 80 - 120 mg / dl Electronic Signature(s) Signed: 07/17/2019 5:16:23 PM By: [name] RN, BSN Entered By: [name] on 07/17/2019 15:47:56

## 2019-08-04 NOTE — Progress Notes (Signed)
Erin [name], Erin [name] ([id number]) Visit Report for 07/17/2019 Debridement Details Patient Name: Date of Service: Erin [name] [name] 07/17/2019 3:30 PM Medical Record Number: [id number] Patient Account Number: [id number] Date of Birth/Sex: Treating RN: February 23, 1931 (86 y.o. Erin [name], Erin [name] Primary Care Provider: Henderson Cloud NE, A NGELA Other Clinician: Referring Provider: Treating Provider/Extender: Maudie Flakes NE, A NGELA [name] in Treatment: 13 Debridement Performed for Assessment: Wound #6 Right Lower Leg Performed By: Physician Ricard Dillon., MD Debridement Type: Debridement Level of Consciousness (Pre-procedure): Awake and Alert Pre-procedure Verification/Time Out Yes - 16:35 Taken: Start Time: 16:36 Pain Control: Lidocaine 4% T opical Solution T Area Debrided (L x W): otal 9.5 (cm) x 12 (cm) = 114 (cm) Tissue and other material debrided: Viable, Non-Viable, [name], Subcutaneous, Skin: Dermis , Fibrin/Exudate, [name] Level: Skin/Subcutaneous Tissue Debridement Description: Excisional Instrument: Curette Bleeding: Minimum Hemostasis Achieved: Pressure End Time: 16:40 Procedural Pain: 0 Post Procedural Pain: 3 Response to Treatment: Procedure was tolerated well Level of Consciousness (Post- Awake and Alert procedure): Post Debridement Measurements of Total Wound Length: (cm) 9.5 Width: (cm) 12 Depth: (cm) 0.1 Volume: (cm) 8.954 Character of Wound/Ulcer Post Debridement: Improved Post Procedure Diagnosis Same as Pre-procedure Electronic Signature(s) Signed: 07/17/2019 5:30:42 PM By: Linton Ham MD Signed: 08/04/2019 1:34:25 PM By: Deon Pilling Entered By: Linton Ham on 07/17/2019 16:47:26 -------------------------------------------------------------------------------- HPI Details Patient Name: Date of Service: Erin [name] [name] 07/17/2019 3:30 PM Medical Record Number: [id number] Patient Account Number: [id number] Date of Birth/Sex: Treating  RN: 1930/06/19 (86 y.o. [name] Primary Care Provider: Henderson Cloud NE, A NGELA Other Clinician: Referring Provider: Treating Provider/Extender: Maudie Flakes NE, A NGELA [name] in Treatment: 13 History of Present Illness HPI Description: We to find a new wound on the left 03/22/17; this is an 86 year old woman. There is not a lot of information in care everywhere on this patient. She had a history of a malignant neoplasm her left breast for which she had lumpectomy but she did not have radiation. Apparently she has had long- standing edema and her lower legs and she was seen in the wound care center at [facility] in [place] by [name] October. Both her legs were wrapped however it sounds as though the left leg became secondarily infected she apparently had MRSA and she was admitted the hospital. Not really turn for wound care. She lives at home on her own and has apparently [facility] home health care. We are not exactly clear what [facility] is doing to her legs but it does not involve compression. She is not a diabetic. ABIs in our clinic were noncompressible bilaterally 03/30/17; this is a patient who has bilateral chronic venous insufficiency, severe venous inflammation. We had a nice description number legs from a note by [name] dating 2010 describing edematous warm legs with erythema. This suggests that she has chronic venous insufficiency with inflammation.. We have arterial studies dated 12/29/16 again from [facility]. This showed a right ABI at 1 and a TBI of 0.81 on the left the ABI was 1.25 and a TBI of 0.72. This was just she does not have significant arterial insufficiency. She had normal triphasic waveforms noted that she was admitted to hospital in late October with MSSA cellulitis Last visit we wrapped both her legs. She had an open area predominantly on the left anterior but weeping edema on the right anterior leg. She has done well there is only one at  anterior left tibial  wound 04/06/17; the patient has no open area on her right leg. Her remaining wound is on the left anterior leg, the left posterior leg wound is healed. We have been using silver alginate changed to [facility] on the left leg today she has home health 04/13/17; still no open area on her right leg. Her remaining wound on the left anterior leg. We have been using using Hydrofera Blue 04/20/17; no open area on the right leg although the edema here is somewhat disfiguring. Her remaining wound is on the left anterior leg we've been using Hydrofera Blue with improvement in dimensions. She has [facility] home health and lives in [place]. She requests to come every 2 [name] because of the distance involved in coming here 05/04/17; there is no open area on the right leg. She has a very small left anterior leg wound that remains. However what remains on the left still has some depth and a not to viable looking surface. It is too small to really attempt debridement. She has home health coming out to her home in [facility] 05/18/17; there is no open area on either leg. She has significant chronic venous insufficiency. The left anterior leg wound that was still open 2 [name] ago has closed. She has home health coming out. We are going to order Juxtalite stockings for both legs. She is agreed to pay for these privately. We will order them from [facility] READMISSION 04/14/2019 This is a patient we previously had in this clinic in 2019 for 3 months. She had a wound on the left anterior greater than right anterior leg. According to my notes we discharged her and juxta lite stockings although it does not appear that she ever got them. The history here is a bit difficult. She apparently has had a wound on the left lower leg for the last 6 months. She required admission to hospital in [facility] for apparent cellulitis just before Christmas was discharged home she has an open wound. She has a home health  agency although we are not exactly sure which one. They are applying silver alginate. No compression Past medical history is reviewed she has chronic venous insufficiency and a history of left breast CA. 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 She did not have any compression on the wounds today. 2/8; substantial wound on the right posterior calf and a smaller area on the left medial calf. We are using Iodoflex to both wound areas under compression. We have better edema control We to find a new wound on the left anterior medial calf 2/15; the area on the left medial calf is closed over. Substantial area on the right posterior and lateral calf. We have been using Iodoflex although the patient complains of pain and wonders if there is not an alternative. She has home health changing the dressing twice a week and she sees [place] once 2/22; the wound on the left medial calf is closed over. Her edema control is not good here but she cannot get on stockings. This substantial wound is on the right lateral and right posterior calf. Changed her to a hydrofera Blue last week. Gradual improvement in wound area. 3/8; wound on the left leg remains closed. The substantial wound is on the right lateral and right posterior calf. This is measuring smaller 3/22; 2-week follow-up. Substantial area on the  right posterior and lateral calf measures slightly smaller. We are using Hydrofera Blue under compression 4/5; 2-week follow-up. Substantial area on the right posterior lateral calf. This measures slightly smaller. She has islands of epithelialization we have been using Hydrofera Blue under compression 4/19; 2-week follow-up. We have been using Hydrofera Blue however there is maceration around the wound. I will change to silver alginate today. She has islands of epithelialization however I  did not see much evidence that things were improving here. 5/6; 2-week follow-up. I put silver alginate on this because of maceration around the wound last time. However she comes in today with a lot of very adherent debris on the wound of the right lateral leg. Electronic Signature(s) Signed: 07/17/2019 5:30:42 PM By: Linton Ham MD Entered By: Linton Ham on 07/17/2019 16:48:14 -------------------------------------------------------------------------------- Physical Exam Details Patient Name: Date of Service: Erin [name] [name] 07/17/2019 3:30 PM Medical Record Number: [id number] Patient Account Number: [id number] Date of Birth/Sex: Treating RN: [birth year] (86 y.o. [name] Primary Care Provider: Henderson Cloud NE, A NGELA Other Clinician: Referring Provider: Treating Provider/Extender: Maudie Flakes NE, A NGELA [name] in Treatment: 13 Constitutional Sitting or standing Blood Pressure is within target range for patient.. Pulse regular and within target range for patient.Marland Kitchen Respirations regular, non-labored and within target range.. Temperature is normal and within the target range for the patient.Marland Kitchen Appears in no distress. Notes Wound exam; the wound is measuring smaller however this is an irregular large wound on the right anterior lateral calf. Using a scoop curette I removed as much adherent debris is the patient could tolerate. Hemostasis with direct pressure. There is no evidence of surrounding infection Electronic Signature(s) Signed: 07/17/2019 5:30:42 PM By: Linton Ham MD Entered By: Linton Ham on 07/17/2019 16:49:33 -------------------------------------------------------------------------------- Physician Orders Details Patient Name: Date of Service: Erin [name] [name] 07/17/2019 3:30 PM Medical Record Number: [id number] Patient Account Number: [id number] Date of Birth/Sex: Treating RN: 07/20/1930 (86 y.o. Erin [name], Erin [name] Primary Care Provider: Henderson Cloud  NE, A NGELA Other Clinician: Referring Provider: Treating Provider/Extender: Maudie Flakes NE, A NGELA [name] in Treatment: 13 Verbal / Phone Orders: No Diagnosis Coding ICD-10 Coding Code Description I87.331 Chronic venous hypertension (idiopathic) with ulcer and inflammation of right lower extremity L97.812 Non-pressure chronic ulcer of other part of right lower leg with fat layer exposed I89.0 Lymphedema, not elsewhere classified L97.821 Non-pressure chronic ulcer of other part of left lower leg limited to breakdown of skin Follow-up Appointments Return Appointment in 2 [name]. Dressing Change Frequency Wound #6 Right Lower Leg Change dressing three times week. - by home health. Home health to change 2x a week on week that patient has appt at wound clinic. Skin Barriers/Peri-Wound Care Barrier cream - to excoriated areas Moisturizing lotion Wound Cleansing May shower with protection. - use cast protector Primary Wound Dressing Wound #6 Right Lower Leg Hydrofera Blue - adaptic under hydrofera blue. Secondary Dressing Wound #6 Right Lower Leg ABD pad Zetuvit or Kerramax - or Xtrasorb (or other superabsorbent dressing) Edema Control 3 Layer Compression System - Right Lower Extremity Avoid standing for long periods of time Elevate legs to the level of the heart or above for 30 minutes daily and/or when sitting, a frequency of: - throughout the day [place] skilled nursing for wound care. - [facility] Electronic Signature(s) Signed: 07/17/2019 5:30:42 PM By: Linton Ham MD Signed: 07/17/2019 5:34:26 PM By: Deon Pilling Entered By: Deon Pilling on 07/17/2019  16:44:00 -------------------------------------------------------------------------------- Problem List Details Patient Name: Date of Service: Erin [name] [name] 07/17/2019 3:30 PM Medical Record Number: [id number] Patient Account Number: [id number] Date of Birth/Sex: Treating  RN: 1930/10/04 (86 y.o. Erin [name], Erin [name] Primary Care Provider: Henderson Cloud NE, A NGELA Other Clinician: Referring Provider: Treating Provider/Extender: Maudie Flakes NE, A NGELA [name] in Treatment: 13 Active Problems ICD-10 Encounter Code Description Active Date MDM Diagnosis I87.331 Chronic venous hypertension (idiopathic) with ulcer and inflammation of right 04/14/2019 No Yes lower extremity L97.812 Non-pressure chronic ulcer of other part of right lower leg with fat layer 04/14/2019 No Yes exposed I89.0 Lymphedema, not elsewhere classified 04/14/2019 No Yes L97.821 Non-pressure chronic ulcer of other part of left lower leg limited to breakdown 04/21/2019 No Yes of skin Inactive Problems Resolved Problems Electronic Signature(s) Signed: 07/17/2019 5:30:42 PM By: Linton Ham MD Entered By: Linton Ham on 07/17/2019 16:46:38 -------------------------------------------------------------------------------- Progress Note Details Patient Name: Date of Service: Erin [name] [name] 07/17/2019 3:30 PM Medical Record Number: [id number] Patient Account Number: [id number] Date of Birth/Sex: Treating RN: [birth year] (86 y.o. Erin [name], Erin [name] Primary Care Provider: Henderson Cloud NE, A NGELA Other Clinician: Referring Provider: Treating Provider/Extender: Maudie Flakes NE, A NGELA [name] in Treatment: 13 Subjective History of Present Illness (HPI) We to find a new wound on the left 03/22/17; this is an 86 year old woman. There is not a lot of information in care everywhere on this patient. She had a history of a malignant neoplasm her left breast for which she had lumpectomy but she did not have radiation. Apparently she has had long-standing edema and her lower legs and she was seen in the wound care center at [facility] in [place] by [name] October. Both her legs were wrapped however it sounds as though the left leg became secondarily infected she apparently had MRSA and she was  admitted the hospital. Not really turn for wound care. She lives at home on her own and has apparently [facility] home health care. We are not exactly clear what [facility] is doing to her legs but it does not involve compression. She is not a diabetic. ABIs in our clinic were noncompressible bilaterally 03/30/17; this is a patient who has bilateral chronic venous insufficiency, severe venous inflammation. We had a nice description number legs from a note by [name] dating 2010 describing edematous warm legs with erythema. This suggests that she has chronic venous insufficiency with inflammation.. We have arterial studies dated 12/29/16 again from [facility]. This showed a right ABI at 1 and a TBI of 0.81 on the left the ABI was 1.25 and a TBI of 0.72. This was just she does not have significant arterial insufficiency. She had normal triphasic waveforms noted that she was admitted to hospital in late October with MSSA cellulitis Last visit we wrapped both her legs. She had an open area predominantly on the left anterior but weeping edema on the right anterior leg. She has done well there is only one at anterior left tibial wound 04/06/17; the patient has no open area on her right leg. Her remaining wound is on the left anterior leg, the left posterior leg wound is healed. We have been using silver alginate changed to [facility] on the left leg today she has home health 04/13/17; still no open area on her right leg. Her remaining wound on the left anterior leg. We have been using using Hydrofera Blue 04/20/17; no open area on the  right leg although the edema here is somewhat disfiguring. Her remaining wound is on the left anterior leg we've been using Hydrofera Blue with improvement in dimensions. She has [facility] home health and lives in [place]. She requests to come every 2 [name] because of the distance involved in coming here 05/04/17; there is no open area on the right leg. She  has a very small left anterior leg wound that remains. However what remains on the left still has some depth and a not to viable looking surface. It is too small to really attempt debridement. She has home health coming out to her home in [facility] 05/18/17; there is no open area on either leg. She has significant chronic venous insufficiency. The left anterior leg wound that was still open 2 [name] ago has closed. She has home health coming out. We are going to order Juxtalite stockings for both legs. She is agreed to pay for these privately. We will order them from [facility] READMISSION 04/14/2019 This is a patient we previously had in this clinic in 2019 for 3 months. She had a wound on the left anterior greater than right anterior leg. According to my notes we discharged her and juxta lite stockings although it does not appear that she ever got them. The history here is a bit difficult. She apparently has had a wound on the left lower leg for the last 6 months. She required admission to hospital in [facility] for apparent cellulitis just before Christmas was discharged home she has an open wound. She has a home health agency although we are not exactly sure which one. They are applying silver alginate. No compression Past medical history is reviewed she has chronic venous insufficiency and a history of left breast CA. 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 She did not have any compression on the wounds today. 2/8; substantial wound on the right posterior calf and a smaller area on the left medial calf. We are using Iodoflex to both wound areas under compression. We have better edema control We to find a new wound on the left anterior medial calf 2/15; the area on the left medial calf is closed over. Substantial area on the right posterior and lateral calf. We have been using  Iodoflex although the patient complains of pain and wonders if there is not an alternative. She has home health changing the dressing twice a week and she sees [place] once 2/22; the wound on the left medial calf is closed over. Her edema control is not good here but she cannot get on stockings. This substantial wound is on the right lateral and right posterior calf. Changed her to a hydrofera Blue last week. Gradual improvement in wound area. 3/8; wound on the left leg remains closed. The substantial wound is on the right lateral and right posterior calf. This is measuring smaller 3/22; 2-week follow-up. Substantial area on the right posterior and lateral calf measures slightly smaller. We are using Hydrofera Blue under compression 4/5; 2-week follow-up. Substantial area on the right posterior lateral calf. This measures slightly smaller. She has islands of epithelialization we have been using Hydrofera Blue under compression 4/19; 2-week follow-up. We have been using Hydrofera Blue however there is maceration around the wound. I will change to silver alginate today. She has islands of epithelialization however I did not see  much evidence that things were improving here. 5/6; 2-week follow-up. I put silver alginate on this because of maceration around the wound last time. However she comes in today with a lot of very adherent debris on the wound of the right lateral leg. Objective Constitutional Sitting or standing Blood Pressure is within target range for patient.. Pulse regular and within target range for patient.Marland Kitchen Respirations regular, non-labored and within target range.. Temperature is normal and within the target range for the patient.Marland Kitchen Appears in no distress. Vitals Time Taken: 3:47 PM, Height: 60 in, Weight: 130 lbs, BMI: 25.4, Temperature: 98.1 F, Pulse: 62 bpm, Respiratory Rate: 18 breaths/min, Blood Pressure: 130/40 mmHg. General Notes: Wound exam; the wound is measuring smaller however  this is an irregular large wound on the right anterior lateral calf. Using a scoop curette I removed as much adherent debris is the patient could tolerate. Hemostasis with direct pressure. There is no evidence of surrounding infection Integumentary (Hair, Skin) Wound #6 status is Open. Original cause of wound was Gradually Appeared. The wound is located on the Right Lower Leg. The wound measures 9.5cm length x 12cm width x 0.1cm depth; 89.535cm^2 area and 8.954cm^3 volume. There is Fat Layer (Subcutaneous Tissue) Exposed exposed. There is no tunneling or undermining noted. There is a large amount of serosanguineous drainage noted. The wound margin is distinct with the outline attached to the wound base. There is medium (34-66%) red, pink granulation within the wound bed. There is a medium (34-66%) amount of necrotic tissue within the wound bed including Adherent [name]. 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 There was a Excisional Skin/Subcutaneous Tissue Debridement with a total area of 114 sq cm performed by Ricard Dillon., MD. With the following instrument(s): Curette to remove Viable and Non-Viable tissue/material. Material removed includes Subcutaneous Tissue, [name], Skin: Dermis, and Fibrin/Exudate after achieving pain control using Lidocaine 4% T opical Solution. A time out was conducted at 16:35, prior to the start of the procedure. A Minimum amount of bleeding was controlled with Pressure. The procedure was tolerated well with a pain level of 0 throughout and a pain level of 3 following  the procedure. Post Debridement Measurements: 9.5cm length x 12cm width x 0.1cm depth; 8.954cm^3 volume. Character of Wound/Ulcer Post Debridement is improved. Post procedure Diagnosis Wound #6: Same as Pre-Procedure Pre-procedure diagnosis of Wound #6 is a Lymphedema located on the Right Lower Leg . There was a Three Layer Compression Therapy Procedure by [name], RN. Post procedure Diagnosis Wound #6: Same as Pre-Procedure Plan Follow-up Appointments: Return Appointment in 2 [name]. Dressing Change Frequency: Wound #6 Right Lower Leg: Change dressing three times week. - by home health. Home health to change 2x a week on week that patient has appt at wound clinic. Skin Barriers/Peri-Wound Care: Barrier cream - to excoriated areas Moisturizing lotion Wound Cleansing: May shower with protection. - use cast protector Primary Wound Dressing: Wound #6 Right Lower Leg: Hydrofera Blue - adaptic under hydrofera blue. Secondary Dressing: Wound #6 Right Lower Leg: ABD pad Zetuvit or Kerramax - or Xtrasorb (or other superabsorbent dressing) Edema Control: 3 Layer Compression System - Right Lower Extremity  Avoid standing for long periods of time Elevate legs to the level of the heart or above for 30 minutes daily and/or when sitting, a frequency of: - throughout the day Home Health: [place] skilled nursing for wound care. - [facility] 1. I switch back to [facility] which will at least have some debridement properties. The wound area is too large to consider other options 2. We are keeping her under 3 layer compression she has home health changing the dressing Electronic Signature(s) Signed: 07/17/2019 5:30:42 PM By: Linton Ham MD Entered By: Linton Ham on 07/17/2019 16:50:38 -------------------------------------------------------------------------------- SuperBill Details Patient Name: Date of Service: [name], CHA [name] 07/17/2019 Medical Record Number:  [id number] Patient Account Number: [id number] Date of Birth/Sex: Treating RN: [birth year] (86 y.o. Erin [name], Erin [name] Primary Care Provider: Henderson Cloud NE, A NGELA Other Clinician: Referring Provider: Treating Provider/Extender: Maudie Flakes NE, A NGELA [name] in Treatment: 13 Diagnosis Coding ICD-10 Codes Code Description I87.331 Chronic venous hypertension (idiopathic) with ulcer and inflammation of right lower extremity L97.812 Non-pressure chronic ulcer of other part of right lower leg with fat layer exposed I89.0 Lymphedema, not elsewhere classified L97.821 Non-pressure chronic ulcer of other part of left lower leg limited to breakdown of skin Facility Procedures CPT4 Code: 50354656 Description: 81275 - DEB SUBQ TISSUE 20 SQ CM/< ICD-10 Diagnosis Description T70.017 Non-pressure chronic ulcer of other part of right lower leg with fat layer expo Modifier: sed Quantity: 1 CPT4 Code: 49449675 Description: 91638 - DEB SUBQ TISS EA ADDL 20CM ICD-10 Diagnosis Description 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

## 2019-08-12 ENCOUNTER — Other Ambulatory Visit: Payer: Self-pay

## 2019-08-12 ENCOUNTER — Encounter (HOSPITAL_BASED_OUTPATIENT_CLINIC_OR_DEPARTMENT_OTHER): Payer: Medicare HMO | Admitting: Internal Medicine

## 2019-08-12 ENCOUNTER — Encounter (HOSPITAL_BASED_OUTPATIENT_CLINIC_OR_DEPARTMENT_OTHER): Payer: Medicare HMO | Attending: Internal Medicine | Admitting: Internal Medicine

## 2019-08-12 DIAGNOSIS — I890 Lymphedema, not elsewhere classified: Secondary | ICD-10-CM | POA: Diagnosis not present

## 2019-08-12 DIAGNOSIS — I87331 Chronic venous hypertension (idiopathic) with ulcer and inflammation of right lower extremity: Secondary | ICD-10-CM | POA: Diagnosis not present

## 2019-08-12 DIAGNOSIS — Z853 Personal history of malignant neoplasm of breast: Secondary | ICD-10-CM | POA: Insufficient documentation

## 2019-08-12 DIAGNOSIS — L97812 Non-pressure chronic ulcer of other part of right lower leg with fat layer exposed: Secondary | ICD-10-CM | POA: Insufficient documentation

## 2019-08-12 DIAGNOSIS — L97821 Non-pressure chronic ulcer of other part of left lower leg limited to breakdown of skin: Secondary | ICD-10-CM | POA: Diagnosis not present

## 2019-08-13 NOTE — Progress Notes (Signed)
Erin [name], Erin [name] ([id number]) Visit Report for 08/12/2019 Arrival Information Details Patient Name: Date of Service: Erin [name] [name] 08/12/2019 3:00 PM Medical Record Number: [id number] Patient Account Number: [id number] Date of Birth/Sex: Treating RN: 1931/01/20 (86 y.o. [name] Primary Care Osman Calzadilla: Henderson Cloud NE, A NGELA Other Clinician: Referring Oluwatomisin Hustead: Treating Alois Mincer/Extender: BO O NE, A NGELA Weeks in Treatment: 28 Visit Information History Since Last Visit Added or deleted any medications: No Patient Arrived: Wheel Chair Any new allergies or adverse reactions: No Arrival Time: 15:27 Had a fall or experienced change in No Accompanied By: caregiver activities of daily living that may affect Transfer Assistance: None risk of falls: Patient Identification Verified: Yes Signs or symptoms of abuse/neglect since last visito No Secondary Verification Process Completed: Yes Hospitalized since last visit: No Patient Requires Transmission-Based Precautions: No Implantable device outside of the clinic excluding No Patient Has Alerts: Yes cellular tissue based products placed in the center Patient Alerts: NON COMPRESSABLE since last visit: Has Dressing in Place as Prescribed: Yes Has Compression in Place as Prescribed: Yes Pain Present Now: No Electronic Signature(s) Signed: 08/12/2019 5:56:12 PM By: Deon Pilling Entered By: Deon Pilling on 08/12/2019 15:27:36 -------------------------------------------------------------------------------- Compression Therapy Details Patient Name: Date of Service: Erin [name] [name] 08/12/2019 3:00 PM Medical Record Number: [id number] Patient Account Number: [id number] Date of Birth/Sex: Treating RN: 1930-09-20 (86 y.o. [name] Primary Care Samon Dishner: Henderson Cloud NE, A NGELA Other Clinician: Referring Elie Leppo: Treating Fritz Cauthon/Extender: Maudie Flakes NE, A NGELA Weeks in Treatment: 17 Compression Therapy  Performed for Wound Assessment: Wound #6 Right Lower Leg Performed By: Clinician [name], RN Compression Type: Three Layer Post Procedure Diagnosis Same as Pre-procedure Electronic Signature(s) Signed: 08/13/2019 4:36:43 PM By: [name] RN Entered By: [name] on 08/12/2019 16:34:25 -------------------------------------------------------------------------------- Encounter Discharge Information Details Patient Name: Date of Service: Erin [name] [name] 08/12/2019 3:00 PM Medical Record Number: [id number] Patient Account Number: [id number] Date of Birth/Sex: Treating RN: 11-Jul-1930 (86 y.o. [name] Primary Care Katricia Prehn: Henderson Cloud NE, A NGELA Other Clinician: Referring Geroldine Esquivias: Treating Keiondra Brookover/Extender: Maudie Flakes NE, A NGELA Weeks in Treatment: 17 Encounter Discharge Information Items Discharge Condition: Stable Ambulatory Status: Wheelchair Discharge Destination: Home Transportation: Other Accompanied By: self Schedule Follow-up Appointment: Yes Clinical Summary of Care: Patient Declined Electronic Signature(s) Signed: 08/13/2019 7:13:26 AM By: Kela Millin Entered By: Kela Millin on 08/12/2019 16:52:27 -------------------------------------------------------------------------------- Lower Extremity Assessment Details Patient Name: Date of Service: Erin [name] [name] 08/12/2019 3:00 PM Medical Record Number: [id number] Patient Account Number: [id number] Date of Birth/Sex: Treating RN: Jul 19, 1930 (86 y.o. [name] Primary Care Angeliah Wisdom: Henderson Cloud NE, A NGELA Other Clinician: Referring Marrie Chandra: Treating Bri Wakeman/Extender: Maudie Flakes NE, A NGELA Weeks in Treatment: 17 Edema Assessment Assessed: [Left: No] [Right: Yes] Edema: [Left: Ye] [Right: s] Calf Left: Right: Point of Measurement: 35 cm From Medial Instep cm 27 cm Ankle Left: Right: Point of Measurement: 7 cm From Medial Instep cm 23.5 cm Vascular  Assessment Pulses: Dorsalis Pedis Palpable: [Right:Yes] Electronic Signature(s) Signed: 08/12/2019 5:56:12 PM By: Deon Pilling Entered By: Deon Pilling on 08/12/2019 15:37:19 -------------------------------------------------------------------------------- Multi Wound Chart Details Patient Name: Date of Service: Erin [name] [name] 08/12/2019 3:00 PM Medical Record Number: [id number] Patient Account Number: [id number] Date of Birth/Sex: Treating RN: 11-04-1930 (86 y.o. F) Primary Care Annayah Worthley: Henderson Cloud NE, A NGELA Other Clinician: Referring Galdino Hinchman: Treating Alazar Cherian/Extender: Marda Stalker  O NE, A NGELA Weeks in Treatment: 17 Vital Signs Height(in): 60 Pulse(bpm): 58 Weight(lbs): 130 Blood Pressure(mmHg): 140/51 Body Mass Index(BMI): 25 Temperature(F): 98.4 Respiratory Rate(breaths/min): 18 Photos: [6:No Photos Right Lower Leg] [N/A:N/A N/A] Wound Location: [6:Gradually Appeared] [N/A:N/A] Wounding Event: [6:Lymphedema] [N/A:N/A] Primary Etiology: [6:Cataracts, Glaucoma, Middle ear] [N/A:N/A] Comorbid History: [6:problems, Anemia, Hypertension 03/14/2019] [N/A:N/A] Date Acquired: [6:17] [N/A:N/A] Weeks of Treatment: [6:Open] [N/A:N/A] Wound Status: [6:9.4x11x0.1] [N/A:N/A] Measurements L x W x D (cm) [6:81.21] [N/A:N/A] A (cm) : rea [6:8.121] [N/A:N/A] Volume (cm) : [6:39.50%] [N/A:N/A] % Reduction in Area: [6:84.90%] [N/A:N/A] % Reduction in Volume: [6:Full Thickness Without Exposed] [N/A:N/A] Classification: [6:Support Structures Large] [N/A:N/A] Exudate Amount: [6:Purulent] [N/A:N/A] Exudate Type: [6:yellow, brown, green] [N/A:N/A] Exudate Color: [6:Yes] [N/A:N/A] Foul Odor A Cleansing: [6:fter No] [N/A:N/A] Odor Anticipated Due to Product Use: [6:Distinct, outline attached] [N/A:N/A] Wound Margin: [6:Large (67-100%)] [N/A:N/A] Granulation A mount: [6:Red, Pink] [N/A:N/A] Granulation Quality: [6:Small (1-33%)] [N/A:N/A] Necrotic Amount: [6:Fat Layer  (Subcutaneous Tissue)] [N/A:N/A] Exposed Structures: [6:Exposed: Yes Fascia: No Tendon: No Muscle: No Joint: No Bone: No Medium (34-66%)] [N/A:N/A] Epithelialization: [6:macerated periwound.] [N/A:N/A] Assessment Notes: [6:Compression Therapy] [N/A:N/A] Treatment Notes Wound #6 (Right Lower Leg) 1. Cleanse With Wound Cleanser Soap and water 2. Periwound Care Barrier cream Moisturizing lotion 3. Primary Dressing Applied Other primary dressing (specifiy in notes) Hydrofera Blue 4. Secondary Dressing ABD Pad 6. Support Layer Applied 3 layer compression wrap Notes adaptic under hydrofera. Electronic Signature(s) Signed: 08/12/2019 5:52:05 PM By: Linton Ham MD Entered By: Linton Ham on 08/12/2019 17:29:52 -------------------------------------------------------------------------------- Multi-Disciplinary Care Plan Details Patient Name: Date of Service: Erin [name] [name] 08/12/2019 3:00 PM Medical Record Number: [id number] Patient Account Number: [id number] Date of Birth/Sex: Treating RN: 07/09/1930 (86 y.o. [name] Primary Care Darah Simkin: Henderson Cloud NE, A NGELA Other Clinician: Referring Farron Watrous: Treating Fredrika Canby/Extender: BO O NE, A NGELA Weeks in Treatment: 17 Active Inactive Wound/Skin Impairment Nursing Diagnoses: Impaired tissue integrity Knowledge deficit related to ulceration/compromised skin integrity Goals: Patient/caregiver will verbalize understanding of skin care regimen Date Initiated: 04/14/2019 Target Resolution Date: 09/12/2019 Goal Status: Active Ulcer/skin breakdown will have a volume reduction of 30% by week 4 Date Initiated: 04/14/2019 Date Inactivated: 05/19/2019 Target Resolution Date: 05/16/2019 Goal Status: Met Ulcer/skin breakdown will have a volume reduction of 50% by week 8 Date Initiated: 05/19/2019 Date Inactivated: 06/16/2019 Target Resolution Date: 06/20/2019 Goal Status: Met Interventions: Assess patient/caregiver ability to obtain  necessary supplies Assess patient/caregiver ability to perform ulcer/skin care regimen upon admission and as needed Assess ulceration(s) every visit Provide education on ulcer and skin care Notes: Electronic Signature(s) Signed: 08/13/2019 4:36:43 PM By: [name] RN Entered By: [name] on 08/12/2019 15:05:34 -------------------------------------------------------------------------------- Pain Assessment Details Patient Name: Date of Service: Erin [name] [name] 08/12/2019 3:00 PM Medical Record Number: [id number] Patient Account Number: [id number] Date of Birth/Sex: Treating RN: [birth year] (86 y.o. [name] Primary Care Azaan Leask: Henderson Cloud NE, A NGELA Other Clinician: Referring Kale Rondeau: Treating Jamas Jaquay/Extender: Maudie Flakes NE, A NGELA Weeks in Treatment: 17 Active Problems Location of Pain Severity and Description of Pain Patient Has Paino No Site Locations Rate the pain. Current Pain Level: 0 Pain Management and Medication Current Pain Management: Medication: No Cold Application: No Rest: No Massage: No Activity: No T.E.N.S.: No Heat Application: No Leg drop or elevation: No Is the Current Pain Management Adequate: Adequate How does your wound impact your activities of daily livingo Sleep: No Bathing: No Appetite: No Relationship With Others: No Bladder Continence:  No Emotions: No Bowel Continence: No Work: No Toileting: No Drive: No Dressing: No Hobbies: No Electronic Signature(s) Signed: 08/12/2019 5:56:12 PM By: Deon Pilling Entered By: Deon Pilling on 08/12/2019 15:36:54 -------------------------------------------------------------------------------- Patient/Caregiver Education Details Patient Name: Date of Service: Erin [name] [name] 6/1/2021andnbsp3:00 PM Medical Record Number: [id number] Patient Account Number: [id number] Date of Birth/Gender: Treating RN: [birth year] (86 y.o. [name] Primary Care Physician:  Henderson Cloud NE, A NGELA Other Clinician: Referring Physician: Treating Physician/Extender: Henderson Cloud NE, A NGELA Weeks in Treatment: 17 Education Assessment Education Provided To: Patient Education Topics Provided Wound/Skin Impairment: Methods: Explain/Verbal Responses: State content correctly Electronic Signature(s) Signed: 08/13/2019 4:36:43 PM By: [name] RN Entered By: [name] on 08/12/2019 15:05:52 -------------------------------------------------------------------------------- Wound Assessment Details Patient Name: Date of Service: Erin [name] [name] 08/12/2019 3:00 PM Medical Record Number: [id number] Patient Account Number: [id number] Date of Birth/Sex: Treating RN: Mar 06, 1931 (86 y.o. [name], [name] Primary Care Ardean Melroy: Henderson Cloud NE, A NGELA Other Clinician: Referring Reda Citron: Treating Tytan Sandate/Extender: Maudie Flakes NE, A NGELA Weeks in Treatment: 17 Wound Status Wound Number: 6 Primary Lymphedema Etiology: Wound Location: Right Lower Leg Wound Status: Open Wounding Event: Gradually Appeared Comorbid Cataracts, Glaucoma, Middle ear problems, Anemia, Date Acquired: 03/14/2019 History: Hypertension Weeks Of Treatment: 17 Clustered Wound: No Photos Photo Uploaded By: Mikeal Hawthorne on 08/13/2019 09:40:02 Wound Measurements Length: (cm) 9.4 Width: (cm) 11 Depth: (cm) 0.1 Area: (cm) 81.21 Volume: (cm) 8.121 % Reduction in Area: 39.5% % Reduction in Volume: 84.9% Epithelialization: Medium (34-66%) Tunneling: No Undermining: No Wound Description Classification: Full Thickness Without Exposed Support Structures Wound Margin: Distinct, outline attached Exudate Amount: Large Exudate Type: Purulent Exudate Color: yellow, brown, green Foul Odor After Cleansing: Yes Due to Product Use: No [name]/Fibrino Yes Wound Bed Granulation Amount: Large (67-100%) Exposed Structure Granulation Quality: Red, Pink Fascia Exposed: No Necrotic Amount: Small  (1-33%) Fat Layer (Subcutaneous Tissue) Exposed: Yes Necrotic Quality: Adherent [name] Tendon Exposed: No Muscle Exposed: No Joint Exposed: No Bone Exposed: No Assessment Notes macerated periwound. Treatment Notes Wound #6 (Right Lower Leg) 1. Cleanse With Wound Cleanser Soap and water 2. Periwound Care Barrier cream Moisturizing lotion 3. Primary Dressing Applied Other primary dressing (specifiy in notes) Hydrofera Blue 4. Secondary Dressing ABD Pad 6. Support Layer Applied 3 layer compression wrap Notes adaptic under hydrofera. Electronic Signature(s) Signed: 08/12/2019 5:56:12 PM By: Deon Pilling Entered By: Deon Pilling on 08/12/2019 15:39:42 -------------------------------------------------------------------------------- Vitals Details Patient Name: Date of Service: [name], CHA [name] 08/12/2019 3:00 PM Medical Record Number: [id number] Patient Account Number: [id number] Date of Birth/Sex: Treating RN: 11-25-1930 (86 y.o. [name], [name] Primary Care Lorin Gawron: BO O NE, A NGELA Other Clinician: Referring Robby Bulkley: Treating Manvir Thorson/Extender: BO O NE, A NGELA Weeks in Treatment: 17 Vital Signs Time Taken: 15:27 Temperature (F): 98.4 Height (in): 60 Pulse (bpm): 58 Weight (lbs): 130 Respiratory Rate (breaths/min): 18 Body Mass Index (BMI): 25.4 Blood Pressure (mmHg): 140/51 Reference Range: 80 - 120 mg / dl Electronic Signature(s) Signed: 08/12/2019 5:56:12 PM By: Deon Pilling Entered By: Deon Pilling on 08/12/2019 15:36:43

## 2019-08-13 NOTE — Progress Notes (Signed)
[name], [name] ([id number]) Visit Report for 08/12/2019 HPI Details Patient Name: Date of Service: Erin [name] [name] 08/12/2019 3:00 PM Medical Record Number: [id number] Patient Account Number: [id number] Date of Birth/Sex: Treating RN: 27-Sep-1930 (86 y.o. F) Primary Care Provider: Henderson [name] NE, A NGELA Other Clinician: Referring Provider: Treating Provider/Extender: Erin [name] NE, A NGELA [name] in Treatment: 17 History of Present Illness HPI Description: We to find a new wound on the left 03/22/17; this is an 86 year old woman. There is not a lot of information in care everywhere on this patient. She had a history of a malignant neoplasm her left breast for which she had lumpectomy but she did not have radiation. Apparently she has had long- standing edema and her lower legs and she was seen in the wound care center at [facility] in [place] by Dr. Nils [name] October. Both her legs were wrapped however it sounds as though the left leg became secondarily infected she apparently had MRSA and she was admitted the hospital. Not really turn for wound care. She lives at home on her own and has apparently [facility] home health care. We are not exactly clear what [facility] is doing to her legs but it does not involve compression. She is not a diabetic. ABIs in our clinic were noncompressible bilaterally 03/30/17; this is a patient who has bilateral chronic venous insufficiency, severe venous inflammation. We had a nice description number legs from a note by Dr. Thurnell [name] dating 2010 describing edematous warm legs with erythema. This suggests that she has chronic venous insufficiency with inflammation.. We have arterial studies dated 12/29/16 again from [facility]. This showed a right ABI at 1 and a TBI of 0.81 on the left the ABI was 1.25 and a TBI of 0.72. This was just she does not have significant arterial insufficiency. She had normal triphasic waveforms noted that she was admitted  to hospital in late October with MSSA cellulitis Last visit we wrapped both her legs. She had an open area predominantly on the left anterior but weeping edema on the right anterior leg. She has done well there is only one at anterior left tibial wound 04/06/17; the patient has no open area on her right leg. Her remaining wound is on the left anterior leg, the left posterior leg wound is healed. We have been using silver alginate changed to [facility] on the left leg today she has home health 04/13/17; still no open area on her right leg. Her remaining wound on the left anterior leg. We have been using using Hydrofera Blue 04/20/17; no open area on the right leg although the edema here is somewhat disfiguring. Her remaining wound is on the left anterior leg we've been using Hydrofera Blue with improvement in dimensions. She has [facility] home health and lives in [place]. She requests to come every 2 [name] because of the distance involved in coming here 05/04/17; there is no open area on the right leg. She has a very small left anterior leg wound that remains. However what remains on the left still has some depth and a not to viable looking surface. It is too small to really attempt debridement. She has home health coming out to her home in [facility] 05/18/17; there is no open area on either leg. She has significant chronic venous insufficiency. The left anterior leg wound that was still open 2 [name] ago has closed. She has home health coming out. We are going to order [employer]  stockings for both legs. She is agreed to pay for these privately. We will order them from [facility] READMISSION 04/14/2019 This is a patient we previously had in this clinic in 2019 for 3 months. She had a wound on the left anterior greater than right anterior leg. According to my notes we discharged her and juxta lite stockings although it does not appear that she ever got them. The history here is a bit difficult. She  apparently has had a wound on the left lower leg for the last 6 months. She required admission to hospital in [facility] for apparent cellulitis just before Christmas was discharged home she has an open wound. She has a home health agency although we are not exactly sure which one. They are applying silver alginate. No compression Past medical history is reviewed she has chronic venous insufficiency and a history of left breast CA. 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 She did not have any compression on the wounds today. 2/8; substantial wound on the right posterior calf and a smaller area on the left medial calf. We are using Iodoflex to both wound areas under compression. We have better edema control We to find a new wound on the left anterior medial calf 2/15; the area on the left medial calf is closed over. Substantial area on the right posterior and lateral calf. We have been using Iodoflex although the patient complains of pain and wonders if there is not an alternative. She has home health changing the dressing twice a week and she sees [place] once 2/22; the wound on the left medial calf is closed over. Her edema control is not good here but she cannot get on stockings. This substantial wound is on the right lateral and right posterior calf. Changed her to a hydrofera Blue last week. Gradual improvement in wound area. 3/8; wound on the left leg remains closed. The substantial wound is on the right lateral and right posterior calf. This is measuring smaller 3/22; 2-week follow-up. Substantial area on the right posterior and lateral calf measures slightly smaller. We are using Hydrofera Blue under compression 4/5; 2-week follow-up. Substantial area on the right posterior lateral calf. This measures slightly smaller. She has islands of epithelialization we have been  using Hydrofera Blue under compression 4/19; 2-week follow-up. We have been using Hydrofera Blue however there is maceration around the wound. I will change to silver alginate today. She has islands of epithelialization however I did not see much evidence that things were improving here. 5/6; 2-week follow-up. I put silver alginate on this because of maceration around the wound last time. However she comes in today with a lot of very adherent debris on the wound of the right lateral leg. 5/17; 2-week follow-up. Using Hydrofera Blue under compression. The patient has epithelialization including islands of epithelialization in the middle of fairly substantial wound area on the right posterior lateral calf 6/1; 2-week follow-up. Using Hydrofera Blue under compression. She seems to have increasing epithelialization. This is an irregular wound going from medial to lateral posteriorly in the right posterior calf. This would not be easy wound to get reproducible measurement Electronic Signature(s) Signed: 08/12/2019 5:52:05 PM By: Linton Ham MD Entered By: Linton Ham on 08/12/2019 17:30:34 -------------------------------------------------------------------------------- Physical Exam Details Patient Name: Date of Service: Erin [name], CHA [name] 08/12/2019 3:00 PM Medical Record  Number: [id number] Patient Account Number: [id number] Date of Birth/Sex: Treating RN: 1931-02-27 (86 y.o. F) Primary Care Provider: Henderson [name] NE, A NGELA Other Clinician: Referring Provider: Treating Provider/Extender: Erin [name] NE, A NGELA [name] in Treatment: 17 Constitutional Sitting or standing Blood Pressure is within target range for patient.. Pulse regular and within target range for patient.Marland Kitchen Respirations regular, non-labored and within target range.. Temperature is normal and within the target range for the patient.Marland Kitchen Appears in no distress. Respiratory work of breathing is  normal. Cardiovascular Pedal pulses are palpable. Notes Wound exam; the surface of the wound generally looks healthy. Some debris washed off with wound cleanser and gauze. No mechanical debridement. She continues to have a regular tendency let us of epithelialization. I am hopeful this will continue. She does not have any evidence of infection Electronic Signature(s) Signed: 08/12/2019 5:52:05 PM By: Linton Ham MD Entered By: Linton Ham on 08/12/2019 17:31:26 -------------------------------------------------------------------------------- Physician Orders Details Patient Name: Date of Service: Erin [name] [name] 08/12/2019 3:00 PM Medical Record Number: [id number] Patient Account Number: [id number] Date of Birth/Sex: Treating RN: 21-Jan-1931 (86 y.o. [name] Primary Care Provider: Henderson [name] NE, A NGELA Other Clinician: Referring Provider: Treating Provider/Extender: BO O NE, A NGELA [name] in Treatment: 17 Verbal / Phone Orders: No Diagnosis Coding ICD-10 Coding Code Description I87.331 Chronic venous hypertension (idiopathic) with ulcer and inflammation of right lower extremity L97.812 Non-pressure chronic ulcer of other part of right lower leg with fat layer exposed I89.0 Lymphedema, not elsewhere classified L97.821 Non-pressure chronic ulcer of other part of left lower leg limited to breakdown of skin Follow-up Appointments Return Appointment in 2 [name]. Dressing Change Frequency Wound #6 Right Lower Leg Change dressing three times week. - by home health. Home health to change 2x a week on week that patient has appt at wound clinic. Skin Barriers/Peri-Wound Care Barrier cream - to excoriated areas Moisturizing lotion Wound Cleansing May shower with protection. - use cast protector Primary Wound Dressing Wound #6 Right Lower Leg Hydrofera Blue - adaptic under hydrofera blue. Secondary Dressing Wound #6 Right Lower Leg ABD pad Zetuvit or Kerramax - or Xtrasorb  (or other superabsorbent dressing) Edema Control 3 Layer Compression System - Right Lower Extremity Avoid standing for long periods of time Elevate legs to the level of the heart or above for 30 minutes daily and/or when sitting, a frequency of: - throughout the day [facility] skilled [facility] for wound care. - [facility] Electronic Signature(s) Signed: 08/13/2019 4:36:43 PM By: [name] RN Entered By: [name] on 08/12/2019 15:05:24 -------------------------------------------------------------------------------- Problem List Details Patient Name: Date of Service: Erin [name] [name] 08/12/2019 3:00 PM Medical Record Number: [id number] Patient Account Number: [id number] Date of Birth/Sex: Treating RN: [birth year] (86 y.o. [name] Primary Care Provider: Henderson [name] NE, A NGELA Other Clinician: Referring Provider: Treating Provider/Extender: BO O NE, A NGELA [name] in Treatment: 17 Active Problems ICD-10 Encounter Code Description Active Date MDM Diagnosis I87.331 Chronic venous hypertension (idiopathic) with ulcer and inflammation of right 04/14/2019 No Yes lower extremity L97.812 Non-pressure chronic ulcer of other part of right lower leg with fat layer 04/14/2019 No Yes exposed I89.0 Lymphedema, not elsewhere classified 04/14/2019 No Yes L97.821 Non-pressure chronic ulcer of other part of left lower leg limited to breakdown 04/21/2019 No Yes of skin Inactive Problems Resolved Problems Electronic Signature(s) Signed: 08/12/2019 5:52:05 PM By: Linton Ham MD Entered By: Linton Ham on 08/12/2019 17:29:44 -------------------------------------------------------------------------------- Progress Note  Details Patient Name: Date of Service: Erin [name] [name] 08/12/2019 3:00 PM Medical Record Number: [id number] Patient Account Number: [id number] Date of Birth/Sex: Treating RN: 10/12/1930 (86 y.o. F) Primary Care Provider: Henderson [name] NE, A NGELA Other  Clinician: Referring Provider: Treating Provider/Extender: Erin [name] NE, A NGELA [name] in Treatment: 17 Subjective History of Present Illness (HPI) We to find a new wound on the left 03/22/17; this is an 86 year old woman. There is not a lot of information in care everywhere on this patient. She had a history of a malignant neoplasm her left breast for which she had lumpectomy but she did not have radiation. Apparently she has had long-standing edema and her lower legs and she was seen in the wound care center at [facility] in [place] by Dr. Nils [name] October. Both her legs were wrapped however it sounds as though the left leg became secondarily infected she apparently had MRSA and she was admitted the hospital. Not really turn for wound care. She lives at home on her own and has apparently [facility] home health care. We are not exactly clear what [facility] is doing to her legs but it does not involve compression. She is not a diabetic. ABIs in our clinic were noncompressible bilaterally 03/30/17; this is a patient who has bilateral chronic venous insufficiency, severe venous inflammation. We had a nice description number legs from a note by Dr. Thurnell [name] dating 2010 describing edematous warm legs with erythema. This suggests that she has chronic venous insufficiency with inflammation.. We have arterial studies dated 12/29/16 again from [facility]. This showed a right ABI at 1 and a TBI of 0.81 on the left the ABI was 1.25 and a TBI of 0.72. This was just she does not have significant arterial insufficiency. She had normal triphasic waveforms noted that she was admitted to hospital in late October with MSSA cellulitis Last visit we wrapped both her legs. She had an open area predominantly on the left anterior but weeping edema on the right anterior leg. She has done well there is only one at anterior left tibial wound 04/06/17; the patient has no open area on her right leg. Her  remaining wound is on the left anterior leg, the left posterior leg wound is healed. We have been using silver alginate changed to [facility] on the left leg today she has home health 04/13/17; still no open area on her right leg. Her remaining wound on the left anterior leg. We have been using using Hydrofera Blue 04/20/17; no open area on the right leg although the edema here is somewhat disfiguring. Her remaining wound is on the left anterior leg we've been using Hydrofera Blue with improvement in dimensions. She has [facility] home health and lives in [place]. She requests to come every 2 [name] because of the distance involved in coming here 05/04/17; there is no open area on the right leg. She has a very small left anterior leg wound that remains. However what remains on the left still has some depth and a not to viable looking surface. It is too small to really attempt debridement. She has home health coming out to her home in [facility] 05/18/17; there is no open area on either leg. She has significant chronic venous insufficiency. The left anterior leg wound that was still open 2 [name] ago has closed. She has home health coming out. We are going to order [employer] stockings for both legs. She is agreed to pay  for these privately. We will order them from [facility] READMISSION 04/14/2019 This is a patient we previously had in this clinic in 2019 for 3 months. She had a wound on the left anterior greater than right anterior leg. According to my notes we discharged her and juxta lite stockings although it does not appear that she ever got them. The history here is a bit difficult. She apparently has had a wound on the left lower leg for the last 6 months. She required admission to hospital in [facility] for apparent cellulitis just before Christmas was discharged home she has an open wound. She has a home health agency although we are not exactly sure which one. They are applying silver alginate. No  compression Past medical history is reviewed she has chronic venous insufficiency and a history of left breast CA. 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 She did not have any compression on the wounds today. 2/8; substantial wound on the right posterior calf and a smaller area on the left medial calf. We are using Iodoflex to both wound areas under compression. We have better edema control We to find a new wound on the left anterior medial calf 2/15; the area on the left medial calf is closed over. Substantial area on the right posterior and lateral calf. We have been using Iodoflex although the patient complains of pain and wonders if there is not an alternative. She has home health changing the dressing twice a week and she sees [place] once 2/22; the wound on the left medial calf is closed over. Her edema control is not good here but she cannot get on stockings. This substantial wound is on the right lateral and right posterior calf. Changed her to a hydrofera Blue last week. Gradual improvement in wound area. 3/8; wound on the left leg remains closed. The substantial wound is on the right lateral and right posterior calf. This is measuring smaller 3/22; 2-week follow-up. Substantial area on the right posterior and lateral calf measures slightly smaller. We are using Hydrofera Blue under compression 4/5; 2-week follow-up. Substantial area on the right posterior lateral calf. This measures slightly smaller. She has islands of epithelialization we have been using Hydrofera Blue under compression 4/19; 2-week follow-up. We have been using Hydrofera Blue however there is maceration around the wound. I will change to silver alginate today. She has islands of epithelialization however I did not see much evidence that things were improving here. 5/6; 2-week follow-up. I put  silver alginate on this because of maceration around the wound last time. However she comes in today with a lot of very adherent debris on the wound of the right lateral leg. 5/17; 2-week follow-up. Using Hydrofera Blue under compression. The patient has epithelialization including islands of epithelialization in the middle of fairly substantial wound area on the right posterior lateral calf 6/1; 2-week follow-up. Using Hydrofera Blue under compression. She seems to have increasing epithelialization. This is an irregular wound going from medial to lateral posteriorly in the right posterior calf. This would not be easy wound to get reproducible measurement Objective Constitutional Sitting or standing Blood Pressure is within target range for patient.. Pulse regular and within target range for patient.Marland Kitchen Respirations regular, non-labored and within target range.. Temperature is normal and within the target range for the patient.Marland Kitchen Appears in no distress. Vitals Time Taken:  3:27 PM, Height: 60 in, Weight: 130 lbs, BMI: 25.4, Temperature: 98.4 F, Pulse: 58 bpm, Respiratory Rate: 18 breaths/min, Blood Pressure: 140/51 mmHg. Respiratory work of breathing is normal. Cardiovascular Pedal pulses are palpable. General Notes: Wound exam; the surface of the wound generally looks healthy. Some debris washed off with wound cleanser and gauze. No mechanical debridement. She continues to have a regular tendency let us of epithelialization. I am hopeful this will continue. She does not have any evidence of infection Integumentary (Hair, Skin) Wound #6 status is Open. Original cause of wound was Gradually Appeared. The wound is located on the Right Lower Leg. The wound measures 9.4cm length x 11cm width x 0.1cm depth; 81.21cm^2 area and 8.121cm^3 volume. There is Fat Layer (Subcutaneous Tissue) Exposed exposed. There is no tunneling or undermining noted. There is a large amount of purulent drainage noted. Foul  odor after cleansing was noted. The wound margin is distinct with the outline attached to the wound base. There is large (67-100%) red, pink granulation within the wound bed. There is a small (1-33%) amount of necrotic tissue within the wound bed including Adherent [name]. General Notes: macerated periwound. 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 There was a Three Layer Compression Therapy Procedure by [name], RN. Post procedure Diagnosis Wound #6: Same as Pre-Procedure Plan Follow-up Appointments: Return Appointment in 2 [name]. Dressing Change Frequency: Wound #6 Right Lower Leg: Change dressing three times week. - by home health. Home health to change 2x a week on week that patient has appt at wound clinic. Skin Barriers/Peri-Wound Care: Barrier cream - to excoriated areas Moisturizing lotion Wound Cleansing: May shower with protection. - use cast protector Primary Wound Dressing: Wound #6 Right Lower Leg: Hydrofera Blue - adaptic under hydrofera blue. Secondary Dressing: Wound #6 Right Lower Leg: ABD pad Zetuvit or Kerramax - or Xtrasorb (or other superabsorbent dressing) Edema Control: 3 Layer Compression System - Right Lower Extremity Avoid standing for long periods of time Elevate legs to the level of the heart or above for 30 minutes daily and/or when sitting, a frequency of: - throughout the day Home Health: [facility] skilled nursing for wound care. - [facility] 1. I continue to think this wound is making progress 2. Still using Hydrofera Blue under  compression. Epithelialization is a bit irregular over surface looks healthy and it appears to be epithelializing internally rather than from the circumference Electronic Signature(s) Signed: 08/12/2019 5:52:05 PM By: Linton Ham MD Entered By: Linton Ham on 08/12/2019 17:38:38 -------------------------------------------------------------------------------- SuperBill Details Patient Name: Date of Service: Erin [name], CHA [name] 08/12/2019 Medical Record Number: [id number] Patient Account Number: [id number] Date of Birth/Sex: Treating RN: Feb 24, 1931 (86 y.o. [name] Primary Care Provider: Henderson [name] NE, A NGELA Other Clinician: Referring Provider: Treating Provider/Extender: Erin [name] NE, A NGELA [name] in Treatment: 17 Diagnosis Coding ICD-10 Codes Code Description I87.331 Chronic venous hypertension (idiopathic) with ulcer and inflammation of right lower extremity L97.812 Non-pressure chronic ulcer of other part of right lower leg with fat layer exposed I89.0 Lymphedema, not elsewhere classified L97.821 Non-pressure chronic ulcer of other part of left lower leg  limited to breakdown of skin Facility Procedures CPT4 Code: [id number] Description: (Facility Use Only) [id number] - [place] LWR RT LEG Modifier: Quantity: 1 Physician Procedures : CPT4 Code Description Modifier [id number] - WC PHYS LEVEL 3 - EST PT ICD-10 Diagnosis Description I87.331 Chronic venous hypertension (idiopathic) with ulcer and inflammation of right lower extremity L97.812 Non-pressure chronic ulcer of other part  of right lower leg with fat layer exposed I89.0 Lymphedema, not elsewhere classified L97.821 Non-pressure chronic ulcer of other part of left lower leg limited to breakdown of skin Quantity: 1 Electronic Signature(s) Signed: 08/12/2019 5:52:05 PM By: Linton Ham MD Entered By: Linton Ham on 08/12/2019 17:39:01

## 2019-08-19 ENCOUNTER — Encounter (HOSPITAL_BASED_OUTPATIENT_CLINIC_OR_DEPARTMENT_OTHER): Payer: Medicare HMO | Admitting: Internal Medicine

## 2019-08-25 ENCOUNTER — Encounter (HOSPITAL_BASED_OUTPATIENT_CLINIC_OR_DEPARTMENT_OTHER): Payer: Medicare HMO | Admitting: Physician Assistant

## 2019-08-25 ENCOUNTER — Other Ambulatory Visit: Payer: Self-pay

## 2019-08-25 DIAGNOSIS — I87331 Chronic venous hypertension (idiopathic) with ulcer and inflammation of right lower extremity: Secondary | ICD-10-CM | POA: Diagnosis not present

## 2019-08-25 NOTE — Progress Notes (Addendum)
TIMARIE, LABELL ([id number]) Visit Report for 08/25/2019 Chief Complaint Document Details Patient Name: Date of Service: Erin [name] [name] 08/25/2019 2:00 PM Medical Record Number: [id number] Patient Account Number: [id number] Date of Birth/Sex: Treating RN: 1930/11/12 (86 y.o. [name] Primary Care Provider: Tedra Coupe, A NGELA Other Clinician: Referring Provider: Treating Provider/Extender: Worthy Keeler BO O NE, A NGELA Weeks in Treatment: 19 Information Obtained from: Patient Chief Complaint 03/22/17; patient is here for review of wounds on her bilateral legs Electronic Signature(s) Signed: 08/25/2019 2:33:26 PM By: Worthy Keeler PA-C Entered By: Worthy Keeler on 08/25/2019 14:33:26 -------------------------------------------------------------------------------- HPI Details Patient Name: Date of Service: Erin [name] [name] 08/25/2019 2:00 PM Medical Record Number: [id number] Patient Account Number: [id number] Date of Birth/Sex: Treating RN: 1930/10/09 (86 y.o. [name] Primary Care Provider: Tedra Coupe, A NGELA Other Clinician: Referring Provider: Treating Provider/Extender: Worthy Keeler BO O NE, A NGELA Weeks in Treatment: 19 History of Present Illness HPI Description: We to find a new wound on the left 03/22/17; this is an 86 year old woman. There is not a lot of information in care everywhere on this patient. She had a history of a malignant neoplasm her left breast for which she had lumpectomy but she did not have radiation. Apparently she has had long- standing edema and her lower legs and she was seen in the wound care center at [facility] in [place] by [name] October. Both her legs were wrapped however it sounds as though the left leg became secondarily infected she apparently had MRSA and she was admitted the hospital. Not really turn for wound care. She lives at home on her own and has apparently [facility] home health care. We  are not exactly clear what [facility] is doing to her legs but it does not involve compression. She is not a diabetic. ABIs in our clinic were noncompressible bilaterally 03/30/17; this is a patient who has bilateral chronic venous insufficiency, severe venous inflammation. We had a nice description number legs from a note by [name] dating 2010 describing edematous warm legs with erythema. This suggests that she has chronic venous insufficiency with inflammation.. We have arterial studies dated 12/29/16 again from [facility]. This showed a right ABI at 1 and a TBI of 0.81 on the left the ABI was 1.25 and a TBI of 0.72. This was just she does not have significant arterial insufficiency. She had normal triphasic waveforms noted that she was admitted to hospital in late October with MSSA cellulitis Last visit we wrapped both her legs. She had an open area predominantly on the left anterior but weeping edema on the right anterior leg. She has done well there is only one at anterior left tibial wound 04/06/17; the patient has no open area on her right leg. Her remaining wound is on the left anterior leg, the left posterior leg wound is healed. We have been using silver alginate changed to [facility] on the left leg today she has home health 04/13/17; still no open area on her right leg. Her remaining wound on the left anterior leg. We have been using using Hydrofera Blue 04/20/17; no open area on the right leg although the edema here is somewhat disfiguring. Her remaining wound is on the left anterior leg we've been using Hydrofera Blue with improvement in dimensions. She has [facility] home health and lives in [place]. She requests to come every 2 weeks because of  the distance involved in coming here 05/04/17; there is no open area on the right leg. She has a very small left anterior leg wound that remains. However what remains on the left still has some depth and a not to viable  looking surface. It is too small to really attempt debridement. She has home health coming out to her home in [facility] 05/18/17; there is no open area on either leg. She has significant chronic venous insufficiency. The left anterior leg wound that was still open 2 weeks ago has closed. She has home health coming out. We are going to order Juxtalite stockings for both legs. She is agreed to pay for these privately. We will order them from [facility] READMISSION 04/14/2019 This is a patient we previously had in this clinic in 2019 for 3 months. She had a wound on the left anterior greater than right anterior leg. According to my notes we discharged her and juxta lite stockings although it does not appear that she ever got them. The history here is a bit difficult. She apparently has had a wound on the left lower leg for the last 6 months. She required admission to hospital in [facility] for apparent cellulitis just before Christmas was discharged home she has an open wound. She has a home health agency although we are not exactly sure which one. They are applying silver alginate. No compression Past medical history is reviewed she has chronic venous insufficiency and a history of left breast CA. 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 She did not have any compression on the wounds today. 2/8; substantial wound on the right posterior calf and a smaller area on the left medial calf. We are using Iodoflex to both wound areas under compression. We have better edema control We to find a new wound on the left anterior medial calf 2/15; the area on the left medial calf is closed over. Substantial area on the right posterior and lateral calf. We have been using Iodoflex although the patient complains of pain and wonders if there is not an alternative. She has home health changing the dressing  twice a week and she sees [place] once 2/22; the wound on the left medial calf is closed over. Her edema control is not good here but she cannot get on stockings. This substantial wound is on the right lateral and right posterior calf. Changed her to a hydrofera Blue last week. Gradual improvement in wound area. 3/8; wound on the left leg remains closed. The substantial wound is on the right lateral and right posterior calf. This is measuring smaller 3/22; 2-week follow-up. Substantial area on the right posterior and lateral calf measures slightly smaller. We are using Hydrofera Blue under compression 4/5; 2-week follow-up. Substantial area on the right posterior lateral calf. This measures slightly smaller. She has islands of epithelialization we have been using Hydrofera Blue under compression 4/19; 2-week follow-up. We have been using Hydrofera Blue however there is maceration around the wound. I will change to silver alginate today. She has islands of epithelialization however I did not see much evidence that things were improving here. 5/6; 2-week follow-up. I put silver alginate on this because of maceration around the wound last time. However she comes in today with a lot of very adherent debris on the wound of the right lateral leg. 5/17; 2-week follow-up.  Using Hydrofera Blue under compression. The patient has epithelialization including islands of epithelialization in the middle of fairly substantial wound area on the right posterior lateral calf 6/1; 2-week follow-up. Using Hydrofera Blue under compression. She seems to have increasing epithelialization. This is an irregular wound going from medial to lateral posteriorly in the right posterior calf. This would not be easy wound to get reproducible measurement 08/25/2019 upon evaluation today patient appears to actually be doing better in regard to the wound on her leg at this point. We have been using Adaptic followed by [facility] this is  keeping her from getting stuck. The patient is aware that this is also potentially slowing down her healing simply due to the fact that the [facility] not in direct contact with the wound bed. Nonetheless she was not happy with the way it was sticking previous which is why we switched to this according to what I can read in the note and hear from the patient and her aide with her today. Electronic Signature(s) Signed: 08/25/2019 3:56:55 PM By: Worthy Keeler PA-C Entered By: Worthy Keeler on 08/25/2019 15:56:54 -------------------------------------------------------------------------------- Physical Exam Details Patient Name: Date of Service: Erin [name] [name] 08/25/2019 2:00 PM Medical Record Number: [id number] Patient Account Number: [id number] Date of Birth/Sex: Treating RN: [birth year] (86 y.o. [name] Primary Care Provider: Henderson Cloud NE, A NGELA Other Clinician: Referring Provider: Treating Provider/Extender: Worthy Keeler BO O NE, A NGELA Weeks in Treatment: 27 Constitutional Well-nourished and well-hydrated in no acute distress. Respiratory normal breathing without difficulty. Psychiatric this patient is able to make decisions and demonstrates good insight into disease process. Alert and Oriented x 3. pleasant and cooperative. Notes Upon inspection patient's wound bed actually showed signs again of fairly good granulation with good epithelization as well there is no need for sharp debridement as there did not appear to be any significant [name] buildup which is good news overall I am pleased in this regard. Electronic Signature(s) Signed: 08/25/2019 3:57:09 PM By: Worthy Keeler PA-C Entered By: Worthy Keeler on 08/25/2019 15:57:08 -------------------------------------------------------------------------------- Physician Orders Details Patient Name: Date of Service: Erin [name] [name] 08/25/2019 2:00 PM Medical Record Number: [id number] Patient  Account Number: [id number] Date of Birth/Sex: Treating RN: 03-Dec-1930 (86 y.o. [name] Primary Care Provider: Henderson Cloud NE, A NGELA Other Clinician: Referring Provider: Treating Provider/Extender: Worthy Keeler BO O NE, A NGELA Weeks in Treatment: 10 Verbal / Phone Orders: No Diagnosis Coding ICD-10 Coding Code Description I87.331 Chronic venous hypertension (idiopathic) with ulcer and inflammation of right lower extremity L97.812 Non-pressure chronic ulcer of other part of right lower leg with fat layer exposed I89.0 Lymphedema, not elsewhere classified L97.821 Non-pressure chronic ulcer of other part of left lower leg limited to breakdown of skin Follow-up Appointments Return Appointment in 2 weeks. Dressing Change Frequency Wound #6 Right Lower Leg Change dressing three times week. - by home health. Home health to change 2x a week on week that patient has appt at wound clinic. Skin Barriers/Peri-Wound Care Barrier cream - to excoriated areas Moisturizing lotion Wound Cleansing May shower with protection. - use cast protector Primary Wound Dressing Wound #6 Right Lower Leg Hydrofera Blue - adaptic under hydrofera blue. Secondary Dressing Wound #6 Right Lower Leg ABD pad Zetuvit or Kerramax - or Xtrasorb (or other superabsorbent dressing) Edema Control 3 Layer Compression System - Right Lower Extremity Avoid standing for long periods of time Elevate legs to the  level of the heart or above for 30 minutes daily and/or when sitting, a frequency of: - throughout the day [facility] skilled nursing for wound care. - [facility] Electronic Signature(s) Signed: 08/25/2019 5:38:02 PM By: [name] RN, BSN Signed: 08/25/2019 5:53:30 PM By: Worthy Keeler PA-C Entered By: [name] on 08/25/2019 15:42:14 -------------------------------------------------------------------------------- Problem List Details Patient Name: Date of Service: Erin [name] [name] 08/25/2019 2:00 PM Medical Record Number: [id number] Patient Account Number: [id number] Date of Birth/Sex: Treating RN: Oct 09, 1930 (86 y.o. F) [name] Primary Care Provider: Other Clinician: Henderson Cloud NE, Domenick Gong Referring Provider: Treating Provider/Extender: Jenny Reichmann NE, A NGELA Weeks in Treatment: 19 Active Problems ICD-10 Encounter Code Description Active Date MDM Diagnosis I87.331 Chronic venous hypertension (idiopathic) with ulcer and inflammation of right 04/14/2019 No Yes lower extremity L97.812 Non-pressure chronic ulcer of other part of right lower leg with fat layer 04/14/2019 No Yes exposed I89.0 Lymphedema, not elsewhere classified 04/14/2019 No Yes L97.821 Non-pressure chronic ulcer of other part of left lower leg limited to breakdown 04/21/2019 No Yes of skin Inactive Problems Resolved Problems Electronic Signature(s) Signed: 08/25/2019 2:33:20 PM By: Worthy Keeler PA-C Entered By: Worthy Keeler on 08/25/2019 14:33:19 -------------------------------------------------------------------------------- Progress Note Details Patient Name: Date of Service: Erin [name] [name] 08/25/2019 2:00 PM Medical Record Number: [id number] Patient Account Number: [id number] Date of Birth/Sex: Treating RN: 03-18-1930 (86 y.o. [name] Primary Care Provider: Tedra Coupe, A NGELA Other Clinician: Referring Provider: Treating Provider/Extender: Jenny Reichmann NE, A NGELA Weeks in Treatment: 19 Subjective Chief Complaint Information obtained from Patient 03/22/17; patient is here for review of wounds on her bilateral legs History of Present Illness (HPI) We to find a new wound on the left 03/22/17; this is an 86 year old woman. There is not a lot of information in care everywhere on this patient. She had a history of a malignant neoplasm her left breast for which she had lumpectomy but she did not have radiation. Apparently she has had  long-standing edema and her lower legs and she was seen in the wound care center at [facility] in [place] by [name] October. Both her legs were wrapped however it sounds as though the left leg became secondarily infected she apparently had MRSA and she was admitted the hospital. Not really turn for wound care. She lives at home on her own and has apparently [facility] home health care. We are not exactly clear what [facility] is doing to her legs but it does not involve compression. She is not a diabetic. ABIs in our clinic were noncompressible bilaterally 03/30/17; this is a patient who has bilateral chronic venous insufficiency, severe venous inflammation. We had a nice description number legs from a note by [name] dating 2010 describing edematous warm legs with erythema. This suggests that she has chronic venous insufficiency with inflammation.. We have arterial studies dated 12/29/16 again from [facility]. This showed a right ABI at 1 and a TBI of 0.81 on the left the ABI was 1.25 and a TBI of 0.72. This was just she does not have significant arterial insufficiency. She had normal triphasic waveforms noted that she was admitted to hospital in late October with MSSA cellulitis Last visit we wrapped both her legs. She had an open area predominantly on the left anterior but weeping edema on the right anterior leg. She has done well there is only one at  anterior left tibial wound 04/06/17; the patient has no open area on her right leg. Her remaining wound is on the left anterior leg, the left posterior leg wound is healed. We have been using silver alginate changed to [facility] on the left leg today she has home health 04/13/17; still no open area on her right leg. Her remaining wound on the left anterior leg. We have been using using Hydrofera Blue 04/20/17; no open area on the right leg although the edema here is somewhat disfiguring. Her remaining wound is on the left anterior leg  we've been using Hydrofera Blue with improvement in dimensions. She has [facility] home health and lives in [place]. She requests to come every 2 weeks because of the distance involved in coming here 05/04/17; there is no open area on the right leg. She has a very small left anterior leg wound that remains. However what remains on the left still has some depth and a not to viable looking surface. It is too small to really attempt debridement. She has home health coming out to her home in [place] [facility] 05/18/17; there is no open area on either leg. She has significant chronic venous insufficiency. The left anterior leg wound that was still open 2 weeks ago has closed. She has home health coming out. We are going to order Juxtalite stockings for both legs. She is agreed to pay for these privately. We will order them from [facility] READMISSION 04/14/2019 This is a patient we previously had in this clinic in 2019 for 3 months. She had a wound on the left anterior greater than right anterior leg. According to my notes we discharged her and juxta lite stockings although it does not appear that she ever got them. The history here is a bit difficult. She apparently has had a wound on the left lower leg for the last 6 months. She required admission to hospital in [facility] for apparent cellulitis just before Christmas was discharged home she has an open wound. She has a home health agency although we are not exactly sure which one. They are applying silver alginate. No compression Past medical history is reviewed she has chronic venous insufficiency and a history of left breast CA. 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 She did not have any compression on the wounds today. 2/8; substantial wound on the right posterior calf and a smaller area on the left medial calf. We are using  Iodoflex to both wound areas under compression. We have better edema control We to find a new wound on the left anterior medial calf 2/15; the area on the left medial calf is closed over. Substantial area on the right posterior and lateral calf. We have been using Iodoflex although the patient complains of pain and wonders if there is not an alternative. She has home health changing the dressing twice a week and she sees [place] once 2/22; the wound on the left medial calf is closed over. Her edema control is not good here but she cannot get on stockings. This substantial wound is on the right lateral and right posterior calf. Changed her to a hydrofera Blue last week. Gradual improvement in wound area. 3/8; wound on the left leg remains closed. The substantial wound is on the right lateral and right posterior calf. This is measuring smaller 3/22; 2-week follow-up.  Substantial area on the right posterior and lateral calf measures slightly smaller. We are using Hydrofera Blue under compression 4/5; 2-week follow-up. Substantial area on the right posterior lateral calf. This measures slightly smaller. She has islands of epithelialization we have been using Hydrofera Blue under compression 4/19; 2-week follow-up. We have been using Hydrofera Blue however there is maceration around the wound. I will change to silver alginate today. She has islands of epithelialization however I did not see much evidence that things were improving here. 5/6; 2-week follow-up. I put silver alginate on this because of maceration around the wound last time. However she comes in today with a lot of very adherent debris on the wound of the right lateral leg. 5/17; 2-week follow-up. Using Hydrofera Blue under compression. The patient has epithelialization including islands of epithelialization in the middle of fairly substantial wound area on the right posterior lateral calf 6/1; 2-week follow-up. Using Hydrofera Blue under  compression. She seems to have increasing epithelialization. This is an irregular wound going from medial to lateral posteriorly in the right posterior calf. This would not be easy wound to get reproducible measurement 08/25/2019 upon evaluation today patient appears to actually be doing better in regard to the wound on her leg at this point. We have been using Adaptic followed by [facility] this is keeping her from getting stuck. The patient is aware that this is also potentially slowing down her healing simply due to the fact that the [facility] not in direct contact with the wound bed. Nonetheless she was not happy with the way it was sticking previous which is why we switched to this according to what I can read in the note and hear from the patient and her aide with her today. Objective Constitutional Well-nourished and well-hydrated in no acute distress. Vitals Time Taken: 2:39 PM, Height: 60 in, Weight: 130 lbs, BMI: 25.4, Temperature: 98.5 F, Pulse: 75 bpm, Respiratory Rate: 18 breaths/min, Blood Pressure: 184/72 mmHg. Respiratory normal breathing without difficulty. Psychiatric this patient is able to make decisions and demonstrates good insight into disease process. Alert and Oriented x 3. pleasant and cooperative. General Notes: Upon inspection patient's wound bed actually showed signs again of fairly good granulation with good epithelization as well there is no need for sharp debridement as there did not appear to be any significant [name] buildup which is good news overall I am pleased in this regard. Integumentary (Hair, Skin) Wound #6 status is Open. Original cause of wound was Gradually Appeared. The wound is located on the Right Lower Leg. The wound measures 10cm length x 11cm width x 0.1cm depth; 86.394cm^2 area and 8.639cm^3 volume. There is Fat Layer (Subcutaneous Tissue) Exposed exposed. There is no tunneling or undermining noted. There is a large amount of purulent  drainage noted. Foul odor after cleansing was noted. The wound margin is distinct with the outline attached to the wound base. There is large (67-100%) red, pink granulation within the wound bed. There is a small (1-33%) amount of necrotic tissue within the wound bed including Adherent [name]. 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 There was a Three Layer Compression Therapy  Procedure by [name], RN. Post procedure Diagnosis Wound #6: Same as Pre-Procedure Plan Follow-up Appointments: Return Appointment in 2 weeks. Dressing Change Frequency: Wound #6 Right Lower Leg: Change dressing three times week. - by home health. Home health to change 2x a week on week that patient has appt at wound clinic. Skin Barriers/Peri-Wound Care: Barrier cream - to excoriated areas Moisturizing lotion Wound Cleansing: May shower with protection. - use cast protector Primary Wound Dressing: Wound #6 Right Lower Leg: Hydrofera Blue - adaptic under hydrofera blue. Secondary Dressing: Wound #6 Right Lower Leg: ABD pad Zetuvit or Kerramax - or Xtrasorb (or other superabsorbent dressing) Edema Control: 3 Layer Compression System - Right Lower Extremity Avoid standing for long periods of time Elevate legs to the level of the heart or above for 30 minutes daily and/or when sitting, a frequency of: - throughout the day Home Health: [facility] skilled nursing for wound care. - [facility] 1. I would recommend currently that we go ahead and continue with the [facility] with the Adaptic underneath  which is helping to protect from sticking. Obviously I explained that this is minimizing the effect of the [facility] but nonetheless she is pretty adamant about continuing this way. 2. I am also can recommend that we continue with the 3 layer compression wrap that does seem to be helping control her edema. 3. Also discussed with her possibly get a Velcro compression wrap but she pretty much told me she will not use a standard stocking as she cannot get them on due to her hands and arthritis. I think a Velcro wrap could be a fairly good alternative for her when she heals. We will see patient back for reevaluation in 2 weeks here in the clinic. If anything worsens or changes patient will contact our office for additional recommendations. Electronic Signature(s) Signed: 08/25/2019 3:58:12 PM By: Worthy Keeler PA-C Entered By: Worthy Keeler on 08/25/2019 15:58:11 -------------------------------------------------------------------------------- SuperBill Details Patient Name: Date of Service: Erin [name], CHA [name] 08/25/2019 Medical Record Number: [id number] Patient Account Number: [id number] Date of Birth/Sex: Treating RN: May 27, 1930 (87 y.o. [name] Primary Care Provider: Henderson Cloud NE, A NGELA Other Clinician: Referring Provider: Treating Provider/Extender: Worthy Keeler BO O NE, A NGELA Weeks in Treatment: 19 Diagnosis Coding ICD-10 Codes Code Description I87.331 Chronic venous hypertension (idiopathic) with ulcer and inflammation of right lower extremity L97.812 Non-pressure chronic ulcer of other part of right lower leg with fat layer exposed I89.0 Lymphedema, not elsewhere classified L97.821 Non-pressure chronic ulcer of other part of left lower leg limited to breakdown of skin Facility Procedures CPT4 Code: [id number] Description: (Facility Use Only) [phone] - APPLY MULTLAY COMPRS LWR RT LEG Modifier: Quantity: 1 Physician Procedures : CPT4 Code Description Modifier  [id number] - WC PHYS LEVEL 3 - EST PT ICD-10 Diagnosis Description I87.331 Chronic venous hypertension (idiopathic) with ulcer and inflammation of right lower extremity L97.812 Non-pressure chronic ulcer of other part  of right lower leg with fat layer exposed I89.0 Lymphedema, not elsewhere classified L97.821 Non-pressure chronic ulcer of other part of left lower leg limited to breakdown of skin Quantity: 1 Electronic Signature(s) Signed: 08/25/2019 3:58:23 PM By: Worthy Keeler PA-C Entered By: Worthy Keeler on 08/25/2019 15:58:22

## 2019-08-27 NOTE — Progress Notes (Signed)
Erin [name] ([id number]) Visit Report for 08/25/2019 Arrival Information Details Patient Name: Date of Service: Erin [name] RLO TTE Erin. 08/25/2019 2:00 PM Medical Record Number: [id number] Patient Account Number: [id number] Date of Birth/Sex: Treating RN: May 31, 1930 (86 y.o. Erin [name] Primary Care Erin [name]: Erin [name] NE, Erin [name] Other Clinician: Referring Erin [name]: Treating Averie Hornbaker/Extender: Erin [name] BO O NE, Erin [name] Weeks in [name]: 28 Visit Information History Since Last Visit All ordered tests and consults were completed: No Patient Arrived: Wheel Chair Added or deleted any medications: No Arrival Time: 14:32 Any new allergies or adverse reactions: No Accompanied By: caregiver Had Erin fall or experienced change in No Transfer Assistance: None activities of daily living that may affect Patient Identification Verified: Yes risk of falls: Secondary Verification Process Completed: Yes Signs or symptoms of abuse/neglect since last visito No Patient Requires Transmission-Based Precautions: No Hospitalized since last visit: No Patient Has Alerts: Yes Implantable device outside of the clinic excluding No Patient Alerts: NON COMPRESSABLE cellular tissue based products placed in the center since last visit: Has Dressing in Place as Prescribed: Yes Has Compression in Place as Prescribed: Yes Pain Present Now: No Electronic Signature(s) Signed: 08/27/2019 9:54:21 AM By: Erin Coria RN Entered By: Erin [name] on 08/25/2019 14:38:58 -------------------------------------------------------------------------------- Compression Therapy Details Patient Name: Date of Service: Erin [name] RLO TTE Erin. 08/25/2019 2:00 PM Medical Record Number: [id number] Patient Account Number: [id number] Date of Birth/Sex: Treating RN: [birth year] (86 y.o. Erin [name] Primary Care Antwann Preziosi: Erin [name] NE, Erin [name] Other Clinician: Referring Erin [name]: Treating Kenecia Barren/Extender: Erin [name] BO O NE, Erin [name] Weeks in [name]: 19 Compression Therapy Performed for Wound Assessment: Wound #6 Right Lower Leg Performed By: Clinician Erin Gouty, RN Compression Type: Three Layer Post Procedure Diagnosis Same as Pre-procedure Electronic Signature(s) Signed: 08/25/2019 5:38:02 PM By: Erin Gouty RN, BSN Entered By: Erin [name] on 08/25/2019 15:37:58 -------------------------------------------------------------------------------- Encounter Discharge Information Details Patient Name: Date of Service: Erin [name] RLO TTE Erin. 08/25/2019 2:00 PM Medical Record Number: [id number] Patient Account Number: [id number] Date of Birth/Sex: Treating RN: 02-27-1931 (86 y.o. Erin [name] Primary Care Tiffney Haughton: Erin [name] NE, Erin [name] Other Clinician: Referring Erin [name]: Treating Ellarose Brandi/Extender: Erin [name]: 19 Encounter Discharge Information Items Discharge Condition: Stable Ambulatory Status: Wheelchair Discharge Destination: Home Transportation: Private Auto Accompanied By: caregiver Schedule Follow-up Appointment: Yes Clinical Summary of Care: Patient Declined Electronic Signature(s) Signed: 08/25/2019 5:38:02 PM By: Erin Gouty RN, BSN Entered By: Erin [name] on 08/25/2019 15:56:14 -------------------------------------------------------------------------------- Lower Extremity Assessment Details Patient Name: Date of Service: Erin [name] RLO TTE Erin. 08/25/2019 2:00 PM Medical Record Number: [id number] Patient Account Number: [id number] Date of Birth/Sex: Treating RN: 27-Apr-1930 (86 y.o. Erin [name] Primary Care Pearlie Lafosse: Erin [name] NE, Erin [name] Other Clinician: Referring Shardae Kleinman: Treating Erin [name]/Extender: Erin [name] BO O NE, Erin [name] Weeks in [name]: 19 Edema Assessment Assessed: [Left: No] [Right: No] Edema: [Left: Ye] [Right: s] Calf Left: Right: Point of Measurement: 35 cm From Medial Instep cm  27 cm Ankle Left: Right: Point of Measurement: 7 cm From Medial Instep cm 23.5 cm Electronic Signature(s) Signed: 08/27/2019 9:54:21 AM By: Erin Coria RN Entered By: Erin [name] on 08/25/2019 14:44:49 -------------------------------------------------------------------------------- Multi-Disciplinary Care Plan Details Patient Name: Date of Service: Erin [name] RLO TTE Erin. 08/25/2019 2:00 PM Medical Record Number: [id number] Patient Account Number: [id number] Date of Birth/Sex: Treating RN: March 30, 1930 (86 y.o. Erin [name], [facility]  Erin [name]: Erin [name] NE, Erin [name] Other Clinician: Referring Erin [name]: Treating Erin [name]/Extender: Erin [name] BO O NE, Erin [name] Weeks in [name]: 19 Active Inactive Wound/Skin Impairment Nursing Diagnoses: Impaired tissue integrity Knowledge deficit related to ulceration/compromised skin integrity Goals: Patient/caregiver will verbalize understanding of skin care regimen Date Initiated: 04/14/2019 Target Resolution Date: 09/12/2019 Goal Status: Active Ulcer/skin breakdown will have Erin volume reduction of 30% by week 4 Date Initiated: 04/14/2019 Date Inactivated: 05/19/2019 Target Resolution Date: 05/16/2019 Goal Status: Met Ulcer/skin breakdown will have Erin volume reduction of 50% by week 8 Date Initiated: 05/19/2019 Date Inactivated: 06/16/2019 Target Resolution Date: 06/20/2019 Goal Status: Met Interventions: Assess patient/caregiver ability to obtain necessary supplies Assess patient/caregiver ability to perform ulcer/skin care regimen upon admission and as needed Assess ulceration(s) every visit Provide education on ulcer and skin care Notes: Electronic Signature(s) Signed: 08/25/2019 5:38:02 PM By: Erin Gouty RN, BSN Entered By: Erin [name] on 08/25/2019 14:56:18 -------------------------------------------------------------------------------- Pain Assessment Details Patient Name: Date of Service: Erin [name] RLO TTE Erin. 08/25/2019  2:00 PM Medical Record Number: [id number] Patient Account Number: [id number] Date of Birth/Sex: Treating RN: April 21, 1930 (86 y.o. Erin [name] Primary Care Jamari Moten: Erin [name] NE, Erin [name] Other Clinician: Referring Illa Enlow: Treating Aymara Sassi/Extender: Erin [name] BO O NE, Erin [name] Weeks in [name]: 19 Active Problems Location of Pain Severity and Description of Pain Patient Has Paino No Site Locations Pain Management and Medication Current Pain Management: Electronic Signature(s) Signed: 08/27/2019 9:54:21 AM By: Erin Coria RN Entered By: Erin [name] on 08/25/2019 14:39:26 -------------------------------------------------------------------------------- Patient/Caregiver Education Details Patient Name: Date of Service: Erin [name] RLO TTE Erin. 6/14/2021andnbsp2:00 PM Medical Record Number: [id number] Patient Account Number: [id number] Date of Birth/Gender: Treating RN: [birth year] (86 y.o. Erin [name] Primary Care Physician: Erin [name] NE, Erin [name] Other Clinician: Referring Physician: Treating Physician/Extender: Erin [name]: 43 Education Assessment Education Provided To: Patient Education Topics Provided Venous: Methods: Explain/Verbal Responses: Reinforcements needed, State content correctly Wound/Skin Impairment: Methods: Explain/Verbal Responses: Reinforcements needed Electronic Signature(s) Signed: 08/25/2019 5:38:02 PM By: Erin Gouty RN, BSN Entered By: Erin [name] on 08/25/2019 14:56:45 -------------------------------------------------------------------------------- Wound Assessment Details Patient Name: Date of Service: Erin [name] RLO TTE Erin. 08/25/2019 2:00 PM Medical Record Number: [id number] Patient Account Number: [id number] Date of Birth/Sex: Treating RN: January 03, 1931 (86 y.o. Erin [name] Primary Care Amauris Debois: Erin [name] NE, Erin [name] Other Clinician: Referring Arvo Ealy: Treating David Rodriquez/Extender:  Erin [name] BO O NE, Erin [name] Weeks in [name]: 19 Wound Status Wound Number: 6 Primary Lymphedema Etiology: Wound Location: Right Lower Leg Wound Status: Open Wounding Event: Gradually Appeared Comorbid Cataracts, Glaucoma, Middle ear problems, Anemia, Date Acquired: 03/14/2019 History: Hypertension Weeks Of [name]: 19 Clustered Wound: No Photos Photo Uploaded By: Mikeal Hawthorne on 08/26/2019 14:08:43 Wound Measurements Length: (cm) 10 Width: (cm) 11 Depth: (cm) 0.1 Area: (cm) 86.394 Volume: (cm) 8.639 % Reduction in Area: 35.7% % Reduction in Volume: 83.9% Epithelialization: Medium (34-66%) Tunneling: No Undermining: No Wound Description Classification: Full Thickness Without Exposed Support Struct Wound Margin: Distinct, outline attached Exudate Amount: Large Exudate Type: Purulent Exudate Color: yellow, brown, green ures Foul Odor After Cleansing: Yes Due to Product Use: No [name]/Fibrino Yes Wound Bed Granulation Amount: Large (67-100%) Exposed Structure Granulation Quality: Red, Pink Fascia Exposed: No Necrotic Amount: Small (1-33%) Fat Layer (Subcutaneous Tissue) Exposed: Yes Necrotic Quality: Adherent [name] Tendon Exposed: No Muscle Exposed: No Joint Exposed: No Bone Exposed: No [name] Notes Wound #6 (Right Lower  Leg) 2. Periwound Care Barrier cream Moisturizing lotion 3. Primary Dressing Applied Other primary dressing (specifiy in notes) Hydrofera Blue 4. Secondary Dressing ABD Pad Kerramax/Xtrasorb 6. Support Layer Applied 3 layer compression wrap Notes adaptic under hydrofera. Electronic Signature(s) Signed: 08/27/2019 9:54:21 AM By: Erin Coria RN Signed: 08/27/2019 9:54:21 AM By: Erin Coria RN Entered By: Erin [name] on 08/25/2019 14:48:25 -------------------------------------------------------------------------------- Vitals Details Patient Name: Date of Service: Erin [name] RLO TTE Erin. 08/25/2019 2:00 PM Medical  Record Number: [id number] Patient Account Number: [id number] Date of Birth/Sex: Treating RN: 1930/08/27 (86 y.o. Erin [name] Primary Care Tirzah Fross: Erin [name] NE, Erin [name] Other Clinician: Referring Ameya Kutz: Treating Rasaan Brotherton/Extender: Erin [name] BO O NE, Erin [name] Weeks in [name]: 19 Vital Signs Time Taken: 14:39 Temperature (F): 98.5 Height (in): 60 Pulse (bpm): 75 Weight (lbs): 130 Respiratory Rate (breaths/min): 18 Body Mass Index (BMI): 25.4 Blood Pressure (mmHg): 184/72 Reference Range: 80 - 120 mg / dl Electronic Signature(s) Signed: 08/27/2019 9:54:21 AM By: Erin Coria RN Entered By: Erin [name] on 08/25/2019 14:39:19

## 2019-09-08 ENCOUNTER — Other Ambulatory Visit: Payer: Self-pay

## 2019-09-08 ENCOUNTER — Encounter (HOSPITAL_BASED_OUTPATIENT_CLINIC_OR_DEPARTMENT_OTHER): Payer: Medicare HMO | Admitting: Internal Medicine

## 2019-09-08 DIAGNOSIS — I87331 Chronic venous hypertension (idiopathic) with ulcer and inflammation of right lower extremity: Secondary | ICD-10-CM | POA: Diagnosis not present

## 2019-09-08 NOTE — Progress Notes (Signed)
JOANN, JORGE ([id number]) Visit Report for 09/08/2019 Debridement Details Patient Name: Date of Service: Lindaann Slough [name] 09/08/2019 3:30 PM Medical Record Number: [id number] Patient Account Number: [id number] Date of Birth/Sex: Treating RN: 1930/12/15 (86 y.o. [name] Primary Care Provider: Henderson Cloud NE, A NGELA Other Clinician: Referring Provider: Treating Provider/Extender: Maudie Flakes NE, A NGELA Weeks in Treatment: 21 Debridement Performed for Assessment: Wound #8 Left,Anterior Lower Leg Performed By: Physician Ricard Dillon., MD Debridement Type: Debridement Level of Consciousness (Pre-procedure): Awake and Alert Pre-procedure Verification/Time Out Yes - 16:47 Taken: Start Time: 16:47 Pain Control: Other : Benzocaine 20% T Area Debrided (L x W): otal 2.7 (cm) x 3.5 (cm) = 9.45 (cm) Tissue and other material debrided: Viable, Non-Viable, Eschar, Subcutaneous, Skin: Dermis Level: Skin/Subcutaneous Tissue Debridement Description: Excisional Instrument: Curette Bleeding: Minimum Hemostasis Achieved: Pressure End Time: 16:48 Procedural Pain: 0 Post Procedural Pain: 0 Response to Treatment: Procedure was tolerated well Level of Consciousness (Post- Awake and Alert procedure): Post Debridement Measurements of Total Wound Length: (cm) 2.7 Width: (cm) 3.5 Depth: (cm) 0.1 Volume: (cm) 0.742 Character of Wound/Ulcer Post Debridement: Improved Post Procedure Diagnosis Same as Pre-procedure Electronic Signature(s) Signed: 09/08/2019 5:46:39 PM By: Linton Ham MD Signed: 09/08/2019 6:01:25 PM By: [name] RN, BSN Entered By: Linton Ham on 09/08/2019 17:03:32 -------------------------------------------------------------------------------- HPI Details Patient Name: Date of Service: Lindaann Slough [name] 09/08/2019 3:30 PM Medical Record Number: [id number] Patient Account Number: [id number] Date of Birth/Sex: Treating  RN: December 14, 1930 (86 y.o. [name] Primary Care Provider: Henderson Cloud NE, A NGELA Other Clinician: Referring Provider: Treating Provider/Extender: Maudie Flakes NE, A NGELA Weeks in Treatment: 21 History of Present Illness HPI Description: We to find a new wound on the left 03/22/17; this is an 86 year old woman. There is not a lot of information in care everywhere on this patient. She had a history of a malignant neoplasm her left breast for which she had lumpectomy but she did not have radiation. Apparently she has had long- standing edema and her lower legs and she was seen in the wound care center at [facility] in [place] by [name] October. Both her legs were wrapped however it sounds as though the left leg became secondarily infected she apparently had MRSA and she was admitted the hospital. Not really turn for wound care. She lives at home on her own and has apparently [facility] home health care. We are not exactly clear what [facility] is doing to her legs but it does not involve compression. She is not a diabetic. ABIs in our clinic were noncompressible bilaterally 03/30/17; this is a patient who has bilateral chronic venous insufficiency, severe venous inflammation. We had a nice description number legs from a note by [name] dating 2010 describing edematous warm legs with erythema. This suggests that she has chronic venous insufficiency with inflammation.. We have arterial studies dated 12/29/16 again from [facility]. This showed a right ABI at 1 and a TBI of 0.81 on the left the ABI was 1.25 and a TBI of 0.72. This was just she does not have significant arterial insufficiency. She had normal triphasic waveforms noted that she was admitted to hospital in late October with MSSA cellulitis Last visit we wrapped both her legs. She had an open area predominantly on the left anterior but weeping edema on the right anterior leg. She has done well there is only one at  anterior left tibial wound 04/06/17;  the patient has no open area on her right leg. Her remaining wound is on the left anterior leg, the left posterior leg wound is healed. We have been using silver alginate changed to [facility] on the left leg today she has home health 04/13/17; still no open area on her right leg. Her remaining wound on the left anterior leg. We have been using using Hydrofera Blue 04/20/17; no open area on the right leg although the edema here is somewhat disfiguring. Her remaining wound is on the left anterior leg we've been using Hydrofera Blue with improvement in dimensions. She has [facility] home health and lives in [place]. She requests to come every 2 weeks because of the distance involved in coming here 05/04/17; there is no open area on the right leg. She has a very small left anterior leg wound that remains. However what remains on the left still has some depth and a not to viable looking surface. It is too small to really attempt debridement. She has home health coming out to her home in [facility] 05/18/17; there is no open area on either leg. She has significant chronic venous insufficiency. The left anterior leg wound that was still open 2 weeks ago has closed. She has home health coming out. We are going to order Juxtalite stockings for both legs. She is agreed to pay for these privately. We will order them from [facility] READMISSION 04/14/2019 This is a patient we previously had in this clinic in 2019 for 3 months. She had a wound on the left anterior greater than right anterior leg. According to my notes we discharged her and juxta lite stockings although it does not appear that she ever got them. The history here is a bit difficult. She apparently has had a wound on the left lower leg for the last 6 months. She required admission to hospital in [facility] for apparent cellulitis just before Christmas was discharged home she has an open wound. She has a home health  agency although we are not exactly sure which one. They are applying silver alginate. No compression Past medical history is reviewed she has chronic venous insufficiency and a history of left breast CA. 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 She did not have any compression on the wounds today. 2/8; substantial wound on the right posterior calf and a smaller area on the left medial calf. We are using Iodoflex to both wound areas under compression. We have better edema control We to find a new wound on the left anterior medial calf 2/15; the area on the left medial calf is closed over. Substantial area on the right posterior and lateral calf. We have been using Iodoflex although the patient complains of pain and wonders if there is not an alternative. She has home health changing the dressing twice a week and she sees [place] once 2/22; the wound on the left medial calf is closed over. Her edema control is not good here but she cannot get on stockings. This substantial wound is on the right lateral and right posterior calf. Changed her to a hydrofera Blue last week. Gradual improvement in wound area. 3/8; wound on the left leg remains closed. The substantial wound is on the right lateral and right posterior calf. This is measuring smaller 3/22; 2-week follow-up. Substantial area on the right posterior  and lateral calf measures slightly smaller. We are using Hydrofera Blue under compression 4/5; 2-week follow-up. Substantial area on the right posterior lateral calf. This measures slightly smaller. She has islands of epithelialization we have been using Hydrofera Blue under compression 4/19; 2-week follow-up. We have been using Hydrofera Blue however there is maceration around the wound. I will change to silver alginate today. She has islands of epithelialization however I  did not see much evidence that things were improving here. 5/6; 2-week follow-up. I put silver alginate on this because of maceration around the wound last time. However she comes in today with a lot of very adherent debris on the wound of the right lateral leg. 5/17; 2-week follow-up. Using Hydrofera Blue under compression. The patient has epithelialization including islands of epithelialization in the middle of fairly substantial wound area on the right posterior lateral calf 6/1; 2-week follow-up. Using Hydrofera Blue under compression. She seems to have increasing epithelialization. This is an irregular wound going from medial to lateral posteriorly in the right posterior calf. This would not be easy wound to get reproducible measurement 08/25/2019 upon evaluation today patient appears to actually be doing better in regard to the wound on her leg at this point. We have been using Adaptic followed by [facility] this is keeping her from getting stuck. The patient is aware that this is also potentially slowing down her healing simply due to the fact that the [facility] not in direct contact with the wound bed. Nonetheless she was not happy with the way it was sticking previous which is why we switched to this according to what I can read in the note and hear from the patient and her aide with her today. 6/28; 2-week follow-up. We are following this patient for severe wound on the right lower leg secondary to chronic venous insufficiency using Hydrofera Blue under compression we are making gradual improvements. She arrives in clinic today with what looks like a contusion on the left anterior mid tibia. She says she was washing her leg and pulled some skin. However there is a fairly large wound with surrounding erythema and some swelling Electronic Signature(s) Signed: 09/08/2019 5:46:39 PM By: Linton Ham MD Entered By: Linton Ham on 09/08/2019  17:05:50 -------------------------------------------------------------------------------- Physical Exam Details Patient Name: Date of Service: Lindaann Slough [name] 09/08/2019 3:30 PM Medical Record Number: [id number] Patient Account Number: [id number] Date of Birth/Sex: Treating RN: Feb 26, 1931 (86 y.o. [name] Primary Care Provider: Henderson Cloud NE, A NGELA Other Clinician: Referring Provider: Treating Provider/Extender: Maudie Flakes NE, A NGELA Weeks in Treatment: 21 Constitutional Patient is hypertensive.. Pulse regular and within target range for patient.Marland Kitchen Respirations regular, non-labored and within target range.. Temperature is normal and within the target range for the patient.Marland Kitchen Appears in no distress. Notes Wound exam; the patient's original wound actually looks fairly decent. Tissue looks healthy even under illumination. She has a regular advancing areas of epithelialization. I think we are making progress albeit slowly She has a new area on the left anterior mid tibia. This looks like trauma with a skin tear however she says she was simply washing her leg when it happened. In any case she has debris over the surface which I removed along with what looks like dead skin. The wound is clean underneath. There is erythema surrounding this and swelling. This will certainly justify an antibiotic Electronic Signature(s) Signed: 09/08/2019 5:46:39 PM By: Linton Ham MD Entered By: Linton Ham on 09/08/2019 17:07:50 --------------------------------------------------------------------------------  Physician Orders Details Patient Name: Date of Service: Lindaann Slough [name] 09/08/2019 3:30 PM Medical Record Number: [id number] Patient Account Number: [id number] Date of Birth/Sex: Treating RN: 09-12-1930 (86 y.o. [name] Primary Care Provider: Henderson Cloud NE, A NGELA Other Clinician: Referring Provider: Treating Provider/Extender: Maudie Flakes NE, A  NGELA Weeks in Treatment: 21 Verbal / Phone Orders: No Diagnosis Coding ICD-10 Coding Code Description I87.331 Chronic venous hypertension (idiopathic) with ulcer and inflammation of right lower extremity L97.812 Non-pressure chronic ulcer of other part of right lower leg with fat layer exposed I89.0 Lymphedema, not elsewhere classified L97.821 Non-pressure chronic ulcer of other part of left lower leg limited to breakdown of skin Follow-up Appointments ppointment in 1 week. - Thursday 7/8 Return A Dressing Change Frequency Wound #6 Right Lower Leg Change dressing three times week. - by home health. Home health to change 2x a week on week that patient has appt at wound clinic. Wound #8 Left,Anterior Lower Leg Change dressing three times week. - by home health. Home health to change 2x a week on week that patient has appt at wound clinic. Skin Barriers/Peri-Wound Care Barrier cream - to excoriated areas Moisturizing lotion - both legs Wound Cleansing May shower with protection. - use cast protector Primary Wound Dressing Wound #6 Right Lower Leg Hydrofera Blue - adaptic under hydrofera blue Wound #8 Left,Anterior Lower Leg lginate with Silver - may use adaptic under alginate as needed if alginate sticks to wound bed Calcium A Secondary Dressing Wound #6 Right Lower Leg ABD pad Zetuvit or Kerramax - or Xtrasorb (or other superabsorbent dressing) Wound #8 Left,Anterior Lower Leg Dry Gauze ABD pad Edema Control 3 Layer Compression System - Bilateral Avoid standing for long periods of time Elevate legs to the level of the heart or above for 30 minutes daily and/or when sitting, a frequency of: - throughout the day [facility] skilled nursing for wound care. - [facility] Patient Medications llergies: No Known Allergies A Notifications Medication Indication Start End celluitis left leg 09/08/2019 cephalexin DOSE oral 500 mg capsule - 1 capsule oral tid for  7days Electronic Signature(s) Signed: 09/08/2019 5:09:52 PM By: Linton Ham MD Entered By: Linton Ham on 09/08/2019 17:09:51 -------------------------------------------------------------------------------- Problem List Details Patient Name: Date of Service: Lindaann Slough [name] 09/08/2019 3:30 PM Medical Record Number: [id number] Patient Account Number: [id number] Date of Birth/Sex: Treating RN: 07-Jan-1931 (86 y.o. [name] Primary Care Provider: Henderson Cloud NE, A NGELA Other Clinician: Referring Provider: Treating Provider/Extender: Maudie Flakes NE, A NGELA Weeks in Treatment: 21 Active Problems ICD-10 Encounter Code Description Active Date MDM Diagnosis I87.331 Chronic venous hypertension (idiopathic) with ulcer and inflammation of right 04/14/2019 No Yes lower extremity L97.812 Non-pressure chronic ulcer of other part of right lower leg with fat layer 04/14/2019 No Yes exposed L97.822 Non-pressure chronic ulcer of other part of left lower leg with fat layer exposed6/28/2021 No Yes L03.116 Cellulitis of left lower limb 09/08/2019 No Yes I89.0 Lymphedema, not elsewhere classified 04/14/2019 No Yes Inactive Problems ICD-10 Code Description Active Date Inactive Date L97.821 Non-pressure chronic ulcer of other part of left lower leg limited to breakdown of skin 04/21/2019 04/21/2019 Resolved Problems Electronic Signature(s) Signed: 09/08/2019 5:46:39 PM By: Linton Ham MD Entered By: Linton Ham on 09/08/2019 17:04:37 -------------------------------------------------------------------------------- Progress Note Details Patient Name: Date of Service: Lindaann Slough [name] 09/08/2019 3:30 PM Medical Record Number: [id number] Patient Account Number: [id number] Date of Birth/Sex:  Treating RN: March 28, 1930 (86 y.o. [name] Primary Care Provider: Henderson Cloud NE, A NGELA Other Clinician: Referring Provider: Treating Provider/Extender: Maudie Flakes NE, A  NGELA Weeks in Treatment: 21 Subjective History of Present Illness (HPI) We to find a new wound on the left 03/22/17; this is an 86 year old woman. There is not a lot of information in care everywhere on this patient. She had a history of a malignant neoplasm her left breast for which she had lumpectomy but she did not have radiation. Apparently she has had long-standing edema and her lower legs and she was seen in the wound care center at [facility] in [place] by [name] October. Both her legs were wrapped however it sounds as though the left leg became secondarily infected she apparently had MRSA and she was admitted the hospital. Not really turn for wound care. She lives at home on her own and has apparently [facility] home health care. We are not exactly clear what [facility] is doing to her legs but it does not involve compression. She is not a diabetic. ABIs in our clinic were noncompressible bilaterally 03/30/17; this is a patient who has bilateral chronic venous insufficiency, severe venous inflammation. We had a nice description number legs from a note by [name] dating 2010 describing edematous warm legs with erythema. This suggests that she has chronic venous insufficiency with inflammation.. We have arterial studies dated 12/29/16 again from [facility]. This showed a right ABI at 1 and a TBI of 0.81 on the left the ABI was 1.25 and a TBI of 0.72. This was just she does not have significant arterial insufficiency. She had normal triphasic waveforms noted that she was admitted to hospital in late October with MSSA cellulitis Last visit we wrapped both her legs. She had an open area predominantly on the left anterior but weeping edema on the right anterior leg. She has done well there is only one at anterior left tibial wound 04/06/17; the patient has no open area on her right leg. Her remaining wound is on the left anterior leg, the left posterior leg wound is healed. We have  been using silver alginate changed to [facility] on the left leg today she has home health 04/13/17; still no open area on her right leg. Her remaining wound on the left anterior leg. We have been using using Hydrofera Blue 04/20/17; no open area on the right leg although the edema here is somewhat disfiguring. Her remaining wound is on the left anterior leg we've been using Hydrofera Blue with improvement in dimensions. She has [facility] home health and lives in [place]. She requests to come every 2 weeks because of the distance involved in coming here 05/04/17; there is no open area on the right leg. She has a very small left anterior leg wound that remains. However what remains on the left still has some depth and a not to viable looking surface. It is too small to really attempt debridement. She has home health coming out to her home in [facility] 05/18/17; there is no open area on either leg. She has significant chronic venous insufficiency. The left anterior leg wound that was still open 2 weeks ago has closed. She has home health coming out. We are going to order Juxtalite stockings for both legs. She is agreed to pay for these privately. We will order them from [facility] READMISSION 04/14/2019 This is a patient we previously had in this clinic in 2019 for  3 months. She had a wound on the left anterior greater than right anterior leg. According to my notes we discharged her and juxta lite stockings although it does not appear that she ever got them. The history here is a bit difficult. She apparently has had a wound on the left lower leg for the last 6 months. She required admission to hospital in [place] for apparent cellulitis just before Christmas was discharged home she has an open wound. She has a home health agency although we are not exactly sure which one. They are applying silver alginate. No compression Past medical history is reviewed she has chronic venous insufficiency and a  history of left breast CA. 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 She did not have any compression on the wounds today. 2/8; substantial wound on the right posterior calf and a smaller area on the left medial calf. We are using Iodoflex to both wound areas under compression. We have better edema control We to find a new wound on the left anterior medial calf 2/15; the area on the left medial calf is closed over. Substantial area on the right posterior and lateral calf. We have been using Iodoflex although the patient complains of pain and wonders if there is not an alternative. She has home health changing the dressing twice a week and she sees [place] once 2/22; the wound on the left medial calf is closed over. Her edema control is not good here but she cannot get on stockings. This substantial wound is on the right lateral and right posterior calf. Changed her to a hydrofera Blue last week. Gradual improvement in wound area. 3/8; wound on the left leg remains closed. The substantial wound is on the right lateral and right posterior calf. This is measuring smaller 3/22; 2-week follow-up. Substantial area on the right posterior and lateral calf measures slightly smaller. We are using Hydrofera Blue under compression 4/5; 2-week follow-up. Substantial area on the right posterior lateral calf. This measures slightly smaller. She has islands of epithelialization we have been using Hydrofera Blue under compression 4/19; 2-week follow-up. We have been using Hydrofera Blue however there is maceration around the wound. I will change to silver alginate today. She has islands of epithelialization however I did not see much evidence that things were improving here. 5/6; 2-week follow-up. I put silver alginate on this because of maceration around the wound last time. However she comes  in today with a lot of very adherent debris on the wound of the right lateral leg. 5/17; 2-week follow-up. Using Hydrofera Blue under compression. The patient has epithelialization including islands of epithelialization in the middle of fairly substantial wound area on the right posterior lateral calf 6/1; 2-week follow-up. Using Hydrofera Blue under compression. She seems to have increasing epithelialization. This is an irregular wound going from medial to lateral posteriorly in the right posterior calf. This would not be easy wound to get reproducible measurement 08/25/2019 upon evaluation today patient appears to actually be doing better in regard to the wound on her leg at this point. We have been using Adaptic followed by [facility] this is keeping her from getting stuck. The patient is aware that this is also potentially slowing down her healing simply due to the fact that the [facility] not in direct contact with the wound bed. Nonetheless she  was not happy with the way it was sticking previous which is why we switched to this according to what I can read in the note and hear from the patient and her aide with her today. 6/28; 2-week follow-up. We are following this patient for severe wound on the right lower leg secondary to chronic venous insufficiency using Hydrofera Blue under compression we are making gradual improvements. She arrives in clinic today with what looks like a contusion on the left anterior mid tibia. She says she was washing her leg and pulled some skin. However there is a fairly large wound with surrounding erythema and some swelling Objective Constitutional Patient is hypertensive.. Pulse regular and within target range for patient.Marland Kitchen Respirations regular, non-labored and within target range.. Temperature is normal and within the target range for the patient.Marland Kitchen Appears in no distress. Vitals Time Taken: 3:53 PM, Height: 60 in, Weight: 130 lbs, BMI: 25.4,  Temperature: 98.1 F, Pulse: 76 bpm, Respiratory Rate: 19 breaths/min, Blood Pressure: 188/70 mmHg. General Notes: Wound exam; the patient's original wound actually looks fairly decent. Tissue looks healthy even under illumination. She has a regular advancing areas of epithelialization. I think we are making progress albeit slowly ooShe has a new area on the left anterior mid tibia. This looks like trauma with a skin tear however she says she was simply washing her leg when it happened. In any case she has debris over the surface which I removed along with what looks like dead skin. The wound is clean underneath. There is erythema surrounding this and swelling. This will certainly justify an antibiotic Integumentary (Hair, Skin) Wound #6 status is Open. Original cause of wound was Gradually Appeared. The wound is located on the Right Lower Leg. The wound measures 11.5cm length x 11.9cm width x 0.1cm depth; 107.482cm^2 area and 10.748cm^3 volume. There is Fat Layer (Subcutaneous Tissue) Exposed exposed. There is no tunneling or undermining noted. There is a large amount of serosanguineous drainage noted. The wound margin is distinct with the outline attached to the wound base. There is medium (34-66%) red, pink granulation within the wound bed. There is a medium (34-66%) amount of necrotic tissue within the wound bed including Adherent Slough. Wound #8 status is Open. Original cause of wound was Trauma. The wound is located on the Left,Anterior Lower Leg. The wound measures 2.7cm length x 3.5cm width x 0.1cm depth; 7.422cm^2 area and 0.742cm^3 volume. There is Fat Layer (Subcutaneous Tissue) Exposed exposed. There is no tunneling or undermining noted. There is a small amount of serous drainage noted. The wound margin is distinct with the outline attached to the wound base. There is small (1-33%) red granulation within the wound bed. There is a large (67-100%) amount of necrotic tissue within the  wound bed including Eschar. Assessment Active Problems ICD-10 Chronic venous hypertension (idiopathic) with ulcer and inflammation of right lower extremity Non-pressure chronic ulcer of other part of right lower leg with fat layer exposed Non-pressure chronic ulcer of other part of left lower leg with fat layer exposed Cellulitis of left lower limb Lymphedema, not elsewhere classified Procedures Wound #8 Pre-procedure diagnosis of Wound #8 is a Trauma, Other located on the Left,Anterior Lower Leg . There was a Excisional Skin/Subcutaneous Tissue Debridement with a total area of 9.45 sq cm performed by Ricard Dillon., MD. With the following instrument(s): Curette to remove Viable and Non-Viable tissue/material. Material removed includes Eschar, Subcutaneous Tissue, and Skin: Dermis after achieving pain control using Other (Benzocaine 20%). No specimens  were taken. A time out was conducted at 16:47, prior to the start of the procedure. A Minimum amount of bleeding was controlled with Pressure. The procedure was tolerated well with a pain level of 0 throughout and a pain level of 0 following the procedure. Post Debridement Measurements: 2.7cm length x 3.5cm width x 0.1cm depth; 0.742cm^3 volume. Character of Wound/Ulcer Post Debridement is improved. Post procedure Diagnosis Wound #8: Same as Pre-Procedure Pre-procedure diagnosis of Wound #8 is a Trauma, Other located on the Left,Anterior Lower Leg . There was a Three Layer Compression Therapy Procedure by [name], RN. Post procedure Diagnosis Wound #8: Same as Pre-Procedure Wound #6 Pre-procedure diagnosis of Wound #6 is a Lymphedema located on the Right Lower Leg . There was a Three Layer Compression Therapy Procedure by [name], RN. Post procedure Diagnosis Wound #6: Same as Pre-Procedure Plan Follow-up Appointments: Return Appointment in 1 week. - Thursday 7/8 Dressing Change Frequency: Wound #6 Right Lower  Leg: Change dressing three times week. - by home health. Home health to change 2x a week on week that patient has appt at wound clinic. Wound #8 Left,Anterior Lower Leg: Change dressing three times week. - by home health. Home health to change 2x a week on week that patient has appt at wound clinic. Skin Barriers/Peri-Wound Care: Barrier cream - to excoriated areas Moisturizing lotion - both legs Wound Cleansing: May shower with protection. - use cast protector Primary Wound Dressing: Wound #6 Right Lower Leg: Hydrofera Blue - adaptic under hydrofera blue Wound #8 Left,Anterior Lower Leg: Calcium Alginate with Silver - may use adaptic under alginate as needed if alginate sticks to wound bed Secondary Dressing: Wound #6 Right Lower Leg: ABD pad Zetuvit or Kerramax - or Xtrasorb (or other superabsorbent dressing) Wound #8 Left,Anterior Lower Leg: Dry Gauze ABD pad Edema Control: 3 Layer Compression System - Bilateral Avoid standing for long periods of time Elevate legs to the level of the heart or above for 30 minutes daily and/or when sitting, a frequency of: - throughout the day Home Health: [facility] for [facility]. - [facility] The following medication(s) was prescribed: cephalexin oral 500 mg capsule 1 capsule oral tid for 7days for celluitis left leg starting 09/08/2019 1. I thought her original wound actually looks satisfactory and I think there is increasing bridges of epithelialization although it is happening in a somewhat different fashion everything actually looks healthy. No debridement was required here. We continue with Hydrofera Blue. We have Adaptic underneath this to prevent sticking. 2. The new wound on the left anterior tibia is really of uncertain etiology. This looks traumatic although the patient states she was simply washing her leg nevertheless the tissue over the wound was necrotic I removed it. Wound bed looked healthy. There is  surrounding erythema I prescribed cephalexin 500 3 times daily for 7 days 3. She has poorly controlled edema around this she is going to need 3 layer compression. She was not happy about this however there is really no alternative. 4. Fortunately she has home health to continue to monitor this. Electronic Signature(s) Signed: 09/08/2019 5:46:39 PM By: Linton Ham MD Entered By: Linton Ham on 09/08/2019 17:11:25 -------------------------------------------------------------------------------- SuperBill Details Patient Name: Date of Service: [name], Darcel Bayley [name] 09/08/2019 Medical Record Number: [id number] Patient Account Number: [id number] Date of Birth/Sex: Treating RN: October 05, 1930 (86 y.o. [name] Primary Care Provider: Henderson Cloud NE, A NGELA Other Clinician: Referring Provider: Treating Provider/Extender: Maudie Flakes NE,  A NGELA Weeks in Treatment: 21 Diagnosis Coding ICD-10 Codes Code Description I87.331 Chronic venous hypertension (idiopathic) with ulcer and inflammation of right lower extremity L97.812 Non-pressure chronic ulcer of other part of right lower leg with fat layer exposed L97.822 Non-pressure chronic ulcer of other part of left lower leg with fat layer exposed L03.116 Cellulitis of left lower limb I89.0 Lymphedema, not elsewhere classified Facility Procedures CPT4 Code: 73419379 Description: 02409 - DEB SUBQ TISSUE 20 SQ CM/< ICD-10 Diagnosis Description L97.822 Non-pressure chronic ulcer of other part of left lower leg with fat layer exposed Modifier: Quantity: 1 CPT4 Code: 73532992 Description: (Facility Use Only) 42683MH - APPLY MULTLAY COMPRS LWR RT LEG Modifier: 59 Quantity: 1 Physician Procedures : CPT4 Code Description Modifier [id number] 11042 - WC PHYS SUBQ TISS 20 SQ CM ICD-10 Diagnosis Description L97.822 Non-pressure chronic ulcer of other part of left lower leg with fat layer exposed Quantity: 1 Electronic Signature(s) Signed:  09/08/2019 5:46:39 PM By: Linton Ham MD Signed: 09/08/2019 6:01:25 PM By: [name] RN, BSN Entered By: [name] on 09/08/2019 17:25:44

## 2019-09-08 NOTE — Progress Notes (Signed)
DEMITRIA, HAY ([id number]) Visit Report for 09/08/2019 Arrival Information Details Patient Name: Date of Service: Erin [name] [name] 09/08/2019 3:30 PM Medical Record Number: [id number] Patient Account Number: [id number] Date of Birth/Sex: Treating RN: Jan 29, 1931 (86 y.o. Erin [name] Primary Care Olevia Westervelt: Henderson Cloud NE, A NGELA Other Clinician: Referring Jael Waldorf: Treating Teegan Brandis/Extender: Maudie Flakes NE, A NGELA Weeks in Treatment: 21 Visit Information History Since Last Visit Added or deleted any medications: No Patient Arrived: Wheel Chair Any new allergies or adverse reactions: No Arrival Time: 15:49 Had a fall or experienced change in No Accompanied By: caregiver activities of daily living that may affect Transfer Assistance: Manual risk of falls: Patient Identification Verified: Yes Signs or symptoms of abuse/neglect since last visito No Secondary Verification Process Completed: Yes Hospitalized since last visit: No Patient Requires Transmission-Based Precautions: No Implantable device outside of the clinic excluding No Patient Has Alerts: Yes cellular tissue based products placed in the center Patient Alerts: NON COMPRESSABLE since last visit: Has Dressing in Place as Prescribed: Yes Has Compression in Place as Prescribed: Yes Pain Present Now: No Electronic Signature(s) Signed: 09/08/2019 5:58:35 PM By: Kela Millin Entered By: Kela Millin on 09/08/2019 15:49:55 -------------------------------------------------------------------------------- Compression Therapy Details Patient Name: Date of Service: Erin [name] [name] 09/08/2019 3:30 PM Medical Record Number: [id number] Patient Account Number: [id number] Date of Birth/Sex: Treating RN: 1930/10/22 (86 y.o. Erin [name] Primary Care Jahmeer Porche: Henderson Cloud NE, A NGELA Other Clinician: Referring Hina Gupta: Treating Kyion Gautier/Extender: Maudie Flakes NE, A NGELA Weeks in  Treatment: 21 Compression Therapy Performed for Wound Assessment: Wound #6 Right Lower Leg Performed By: Clinician [name], RN Compression Type: Three Layer Post Procedure Diagnosis Same as Pre-procedure Electronic Signature(s) Signed: 09/08/2019 6:01:25 PM By: [name] RN, BSN Entered By: [name] on 09/08/2019 16:49:30 -------------------------------------------------------------------------------- Compression Therapy Details Patient Name: Date of Service: Erin [name] [name] 09/08/2019 3:30 PM Medical Record Number: [id number] Patient Account Number: [id number] Date of Birth/Sex: Treating RN: 06/06/1930 (86 y.o. Erin [name] Primary Care Ree Alcalde: Henderson Cloud NE, A NGELA Other Clinician: Referring Romona Murdy: Treating Alleigh Mollica/Extender: Maudie Flakes NE, A NGELA Weeks in Treatment: 21 Compression Therapy Performed for Wound Assessment: Wound #8 Left,Anterior Lower Leg Performed By: Clinician [name], RN Compression Type: Three Layer Post Procedure Diagnosis Same as Pre-procedure Electronic Signature(s) Signed: 09/08/2019 6:01:25 PM By: [name] RN, BSN Entered By: [name] on 09/08/2019 16:49:30 -------------------------------------------------------------------------------- Encounter Discharge Information Details Patient Name: Date of Service: Erin [name] [name] 09/08/2019 3:30 PM Medical Record Number: [id number] Patient Account Number: [id number] Date of Birth/Sex: Treating RN: 03/09/1931 (86 y.o. Erin [name] Primary Care Latoiya Maradiaga: Henderson Cloud NE, A NGELA Other Clinician: Referring Tekeyah Santiago: Treating Kaydon Husby/Extender: Maudie Flakes NE, A NGELA Weeks in Treatment: 21 Encounter Discharge Information Items Post Procedure Vitals Discharge Condition: Stable Temperature (F): 98.1 Ambulatory Status: Wheelchair Pulse (bpm): 76 Discharge Destination: Home Respiratory Rate (breaths/min): 19 Transportation: Private  Auto Blood Pressure (mmHg): 188/70 Accompanied By: caregiver Schedule Follow-up Appointment: Yes Clinical Summary of Care: Patient Declined Electronic Signature(s) Signed: 09/08/2019 5:58:35 PM By: Kela Millin Entered By: Kela Millin on 09/08/2019 17:25:59 -------------------------------------------------------------------------------- Lower Extremity Assessment Details Patient Name: Date of Service: Erin [name] [name] 09/08/2019 3:30 PM Medical Record Number: [id number] Patient Account Number: [id number] Date of Birth/Sex: Treating RN: 1930-12-11 (86 y.o. Erin [name] Primary Care Beronica Lansdale: Henderson Cloud NE, A NGELA Other Clinician: Referring Jetta Murray: Treating  Ruta Capece/Extender: Maudie Flakes NE, A NGELA Weeks in Treatment: 21 Edema Assessment Assessed: [Left: No] [Right: No] Edema: [Left: Ye] [Right: s] Calf Left: Right: Point of Measurement: 35 cm From Medial Instep 32 cm 28 cm Ankle Left: Right: Point of Measurement: 7 cm From Medial Instep 23.5 cm 23 cm Vascular Assessment Pulses: Dorsalis Pedis Palpable: [Left:Yes] [Right:Yes] Electronic Signature(s) Signed: 09/08/2019 5:58:35 PM By: Kela Millin Entered By: Kela Millin on 09/08/2019 15:55:11 -------------------------------------------------------------------------------- Multi Wound Chart Details Patient Name: Date of Service: Erin [name] [name] 09/08/2019 3:30 PM Medical Record Number: [id number] Patient Account Number: [id number] Date of Birth/Sex: Treating RN: 1930-07-05 (86 y.o. Erin [name] Primary Care Vernee Baines: Henderson Cloud NE, A NGELA Other Clinician: Referring Josanna Hefel: Treating Amela Handley/Extender: Maudie Flakes NE, A NGELA Weeks in Treatment: 21 Vital Signs Height(in): 60 Pulse(bpm): 76 Weight(lbs): 130 Blood Pressure(mmHg): 188/70 Body Mass Index(BMI): 25 Temperature(F): 98.1 Respiratory Rate(breaths/min): 19 Photos: [6:No Photos Right Lower Leg]  [8:No Photos Left, Anterior Lower Leg] [N/A:N/A N/A] Wound Location: [6:Gradually Appeared] [8:Trauma] [N/A:N/A] Wounding Event: [6:Lymphedema] [8:Trauma, Other] [N/A:N/A] Primary Etiology: [6:N/A] [8:Lymphedema] [N/A:N/A] Secondary Etiology: [6:Cataracts, Glaucoma, Middle ear] [8:Cataracts, Glaucoma, Middle ear] [N/A:N/A] Comorbid History: [6:problems, Anemia, Hypertension 03/14/2019] [8:problems, Anemia, Hypertension 09/04/2019] [N/A:N/A] Date Acquired: [6:21] [8:0] [N/A:N/A] Weeks of Treatment: [6:Open] [8:Open] [N/A:N/A] Wound Status: [6:11.5x11.9x0.1] [8:2.7x3.5x0.1] [N/A:N/A] Measurements L x W x D (cm) [6:107.482] [8:7.422] [N/A:N/A] A (cm) : rea [6:10.748] [8:0.742] [N/A:N/A] Volume (cm) : [6:20.00%] [8:N/A] [N/A:N/A] % Reduction in Area: [6:80.00%] [8:N/A] [N/A:N/A] % Reduction in Volume: [6:Full Thickness Without Exposed] [8:Full Thickness Without Exposed] [N/A:N/A] Classification: [6:Support Structures Large] [8:Support Structures Small] [N/A:N/A] Exudate A mount: [6:Serosanguineous] [8:Serous] [N/A:N/A] Exudate Type: [6:red, brown] [8:amber] [N/A:N/A] Exudate Color: [6:Distinct, outline attached] [8:Distinct, outline attached] [N/A:N/A] Wound Margin: [6:Medium (34-66%)] [8:Small (1-33%)] [N/A:N/A] Granulation Amount: [6:Red, Pink] [8:Red] [N/A:N/A] Granulation Quality: [6:Medium (34-66%)] [8:Large (67-100%)] [N/A:N/A] Necrotic Amount: [6:Adherent [name]] [8:Eschar] [N/A:N/A] Necrotic Tissue: [6:Fat Layer (Subcutaneous Tissue)] [8:Fat Layer (Subcutaneous Tissue)] [N/A:N/A] Exposed Structures: [6:Exposed: Yes Fascia: No Tendon: No Muscle: No Joint: No Bone: No Medium (34-66%)] [8:Exposed: Yes Fascia: No Tendon: No Muscle: No Joint: No Bone: No None] [N/A:N/A] Epithelialization: [6:N/A] [8:Debridement - Excisional] [N/A:N/A] Debridement: Pre-procedure Verification/Time Out N/A [8:16:47] [N/A:N/A] Taken: [6:N/A] [8:Other] [N/A:N/A] Pain Control: [6:N/A] [8:Necrotic/Eschar,  Subcutaneous] [N/A:N/A] Tissue Debrided: [6:N/A] [8:Skin/Subcutaneous Tissue] [N/A:N/A] Level: [6:N/A] [8:9.45] [N/A:N/A] Debridement A (sq cm): [6:rea N/A] [8:Curette] [N/A:N/A] Instrument: [6:N/A] [8:Minimum] [N/A:N/A] Bleeding: [6:N/A] [8:Pressure] [N/A:N/A] Hemostasis A chieved: [6:N/A] [8:0] [N/A:N/A] Procedural Pain: [6:N/A] [8:0] [N/A:N/A] Post Procedural Pain: [6:N/A] [8:Procedure was tolerated well] [N/A:N/A] Debridement Treatment Response: [6:N/A] [8:2.7x3.5x0.1] [N/A:N/A] Post Debridement Measurements L x W x D (cm) [6:N/A] [8:0.742] [N/A:N/A] Post Debridement Volume: (cm) [6:Compression Therapy] [8:Compression Therapy] [N/A:N/A] Procedures Performed: [8:Debridement] Treatment Notes Electronic Signature(s) Signed: 09/08/2019 5:46:39 PM By: Linton Ham MD Signed: 09/08/2019 6:01:25 PM By: [name] RN, BSN Entered By: Linton Ham on 09/08/2019 17:04:44 -------------------------------------------------------------------------------- Multi-Disciplinary Care Plan Details Patient Name: Date of Service: Erin [name] [name] 09/08/2019 3:30 PM Medical Record Number: [id number] Patient Account Number: [id number] Date of Birth/Sex: Treating RN: 03-06-1931 (86 y.o. Erin [name] Primary Care Deliah Strehlow: Henderson Cloud NE, A NGELA Other Clinician: Referring Tram Wrenn: Treating Toshi Ishii/Extender: Maudie Flakes NE, A NGELA Weeks in Treatment: 21 Active Inactive Wound/Skin Impairment Nursing Diagnoses: Impaired tissue integrity Knowledge deficit related to ulceration/compromised skin integrity Goals: Patient/caregiver will verbalize understanding of skin care regimen Date Initiated: 04/14/2019 Target Resolution Date: 10/10/2019 Goal Status: Active Ulcer/skin breakdown will have  a volume reduction of 30% by week 4 Date Initiated: 04/14/2019 Date Inactivated: 05/19/2019 Target Resolution Date: 05/16/2019 Goal Status: Met Ulcer/skin breakdown will have a volume reduction  of 50% by week 8 Date Initiated: 05/19/2019 Date Inactivated: 06/16/2019 Target Resolution Date: 06/20/2019 Goal Status: Met Interventions: Assess patient/caregiver ability to obtain necessary supplies Assess patient/caregiver ability to perform ulcer/skin care regimen upon admission and as needed Assess ulceration(s) every visit Provide education on ulcer and skin care Notes: Electronic Signature(s) Signed: 09/08/2019 6:01:25 PM By: [name] RN, BSN Entered By: [name] on 09/08/2019 17:25:09 -------------------------------------------------------------------------------- Pain Assessment Details Patient Name: Date of Service: Erin [name] [name] 09/08/2019 3:30 PM Medical Record Number: [id number] Patient Account Number: [id number] Date of Birth/Sex: Treating RN: [birth year] (86 y.o. Erin [name] Primary Care Larose Batres: Henderson Cloud NE, A NGELA Other Clinician: Referring Kiing Deakin: Treating Aviyon Hocevar/Extender: Maudie Flakes NE, A NGELA Weeks in Treatment: 21 Active Problems Location of Pain Severity and Description of Pain Patient Has Paino No Site Locations Pain Management and Medication Current Pain Management: Electronic Signature(s) Signed: 09/08/2019 5:58:35 PM By: Kela Millin Entered By: Kela Millin on 09/08/2019 15:54:27 -------------------------------------------------------------------------------- Patient/Caregiver Education Details Patient Name: Date of Service: Erin [name] [name] 6/28/2021andnbsp3:30 PM Medical Record Number: [id number] Patient Account Number: [id number] Date of Birth/Gender: Treating RN: 1931-02-25 (86 y.o. Erin [name] Primary Care Physician: Henderson Cloud NE, A NGELA Other Clinician: Referring Physician: Treating Physician/Extender: Maudie Flakes NE, A NGELA Weeks in Treatment: 21 Education Assessment Education Provided To: Patient Education Topics Provided Wound/Skin Impairment: Methods:  Explain/Verbal Responses: State content correctly Electronic Signature(s) Signed: 09/08/2019 6:01:25 PM By: [name] RN, BSN Entered By: [name] on 09/08/2019 17:25:19 -------------------------------------------------------------------------------- Wound Assessment Details Patient Name: Date of Service: Erin [name] [name] 09/08/2019 3:30 PM Medical Record Number: [id number] Patient Account Number: [id number] Date of Birth/Sex: Treating RN: [birth year] (86 y.o. Erin [name] Primary Care Deztiny Sarra: Henderson Cloud NE, A NGELA Other Clinician: Referring Garrison Michie: Treating Arleatha Philipps/Extender: Maudie Flakes NE, A NGELA Weeks in Treatment: 21 Wound Status Wound Number: 6 Primary Lymphedema Etiology: Wound Location: Right Lower Leg Wound Status: Open Wounding Event: Gradually Appeared Comorbid Cataracts, Glaucoma, Middle ear problems, Anemia, Date Acquired: 03/14/2019 History: Hypertension Weeks Of Treatment: 21 Clustered Wound: No Wound Measurements Length: (cm) 11.5 Width: (cm) 11.9 Depth: (cm) 0.1 Area: (cm) 107.482 Volume: (cm) 10.748 % Reduction in Area: 20% % Reduction in Volume: 80% Epithelialization: Medium (34-66%) Tunneling: No Undermining: No Wound Description Classification: Full Thickness Without Exposed Support Structures Wound Margin: Distinct, outline attached Exudate Amount: Large Exudate Type: Serosanguineous Exudate Color: red, brown Foul Odor After Cleansing: No [name]/Fibrino Yes Wound Bed Granulation Amount: Medium (34-66%) Exposed Structure Granulation Quality: Red, Pink Fascia Exposed: No Necrotic Amount: Medium (34-66%) Fat Layer (Subcutaneous Tissue) Exposed: Yes Necrotic Quality: Adherent [name] Tendon Exposed: No Muscle Exposed: No Joint Exposed: No Bone Exposed: No Treatment Notes Wound #6 (Right Lower Leg) 1. Cleanse With Wound Cleanser Soap and water 2. Periwound Care Barrier cream Moisturizing lotion 3.  Primary Dressing Applied Other primary dressing (specifiy in notes) Hydrofera Blue 4. Secondary Dressing ABD Pad 6. Support Layer Applied 3 layer compression wrap Notes adaptic under hydrofera. Electronic Signature(s) Signed: 09/08/2019 5:58:35 PM By: Kela Millin Entered By: Kela Millin on 09/08/2019 16:03:17 -------------------------------------------------------------------------------- Wound Assessment Details Patient Name: Date of Service: Erin [name] [name] 09/08/2019 3:30 PM Medical Record Number: [id number] Patient Account Number: [id number] Date of Birth/Sex:  Treating RN: 1930/10/03 (86 y.o. Erin [name] Primary Care Rosa Gambale: Henderson Cloud NE, A NGELA Other Clinician: Referring Shone Leventhal: Treating Freeland Pracht/Extender: Maudie Flakes NE, A NGELA Weeks in Treatment: 21 Wound Status Wound Number: 8 Primary Etiology: Trauma, Other Wound Location: Left, Anterior Lower Leg Secondary Lymphedema Etiology: Wounding Event: Trauma Wound Status: Open Date Acquired: 09/04/2019 Comorbid Cataracts, Glaucoma, Middle ear problems, Anemia, Weeks Of Treatment: 0 History: Hypertension Clustered Wound: No Wound Measurements Length: (cm) 2.7 Width: (cm) 3.5 Depth: (cm) 0.1 Area: (cm) 7.422 Volume: (cm) 0.742 % Reduction in Area: % Reduction in Volume: Epithelialization: None Tunneling: No Undermining: No Wound Description Classification: Full Thickness Without Exposed Support Structures Wound Margin: Distinct, outline attached Exudate Amount: Small Exudate Type: Serous Exudate Color: amber Foul Odor After Cleansing: No [name]/Fibrino No Wound Bed Granulation Amount: Small (1-33%) Exposed Structure Granulation Quality: Red Fascia Exposed: No Necrotic Amount: Large (67-100%) Fat Layer (Subcutaneous Tissue) Exposed: Yes Necrotic Quality: Eschar Tendon Exposed: No Muscle Exposed: No Joint Exposed: No Bone Exposed: No Treatment Notes Wound #8  (Left, Anterior Lower Leg) 1. Cleanse With Wound Cleanser 2. Periwound Care Moisturizing lotion 3. Primary Dressing Applied Calcium Alginate Ag 4. Secondary Dressing ABD Pad 6. Support Layer Applied 3 layer compression wrap Electronic Signature(s) Signed: 09/08/2019 5:58:35 PM By: Kela Millin Entered By: Kela Millin on 09/08/2019 16:02:38 -------------------------------------------------------------------------------- Vitals Details Patient Name: Date of Service: Erin [name] [name] 09/08/2019 3:30 PM Medical Record Number: [id number] Patient Account Number: [id number] Date of Birth/Sex: Treating RN: 04-17-1930 (86 y.o. Erin [name] Primary Care Carmie Lanpher: Henderson Cloud NE, A NGELA Other Clinician: Referring Hendrixx Severin: Treating Corie Allis/Extender: Maudie Flakes NE, A NGELA Weeks in Treatment: 21 Vital Signs Time Taken: 15:53 Temperature (F): 98.1 Height (in): 60 Pulse (bpm): 76 Weight (lbs): 130 Respiratory Rate (breaths/min): 19 Body Mass Index (BMI): 25.4 Blood Pressure (mmHg): 188/70 Reference Range: 80 - 120 mg / dl Electronic Signature(s) Signed: 09/08/2019 5:58:35 PM By: Kela Millin Entered By: Kela Millin on 09/08/2019 15:54:20

## 2019-09-18 ENCOUNTER — Encounter (HOSPITAL_BASED_OUTPATIENT_CLINIC_OR_DEPARTMENT_OTHER): Payer: Medicare HMO | Admitting: Internal Medicine

## 2019-09-22 ENCOUNTER — Encounter (HOSPITAL_BASED_OUTPATIENT_CLINIC_OR_DEPARTMENT_OTHER): Payer: Medicare HMO | Attending: Internal Medicine | Admitting: Internal Medicine

## 2019-09-22 DIAGNOSIS — L03116 Cellulitis of left lower limb: Secondary | ICD-10-CM | POA: Insufficient documentation

## 2019-09-22 DIAGNOSIS — I87331 Chronic venous hypertension (idiopathic) with ulcer and inflammation of right lower extremity: Secondary | ICD-10-CM | POA: Insufficient documentation

## 2019-09-22 DIAGNOSIS — I890 Lymphedema, not elsewhere classified: Secondary | ICD-10-CM | POA: Diagnosis not present

## 2019-09-22 DIAGNOSIS — I872 Venous insufficiency (chronic) (peripheral): Secondary | ICD-10-CM | POA: Diagnosis not present

## 2019-09-22 DIAGNOSIS — L97812 Non-pressure chronic ulcer of other part of right lower leg with fat layer exposed: Secondary | ICD-10-CM | POA: Insufficient documentation

## 2019-09-22 DIAGNOSIS — Z8614 Personal history of Methicillin resistant Staphylococcus aureus infection: Secondary | ICD-10-CM | POA: Diagnosis not present

## 2019-09-22 DIAGNOSIS — Z853 Personal history of malignant neoplasm of breast: Secondary | ICD-10-CM | POA: Diagnosis not present

## 2019-09-22 DIAGNOSIS — L97822 Non-pressure chronic ulcer of other part of left lower leg with fat layer exposed: Secondary | ICD-10-CM | POA: Diagnosis not present

## 2019-09-23 NOTE — Progress Notes (Addendum)
Erin [name] ([id number]) Visit Report for 09/22/2019 Arrival Information Details Patient Name: Date of Service: Erin [name] [name] 09/22/2019 2:30 PM Medical Record Number: [id number] Patient Account Number: [id number] Date of Birth/Sex: Treating RN: 04/07/1930 (86 y.o. Erin [name] Primary Care Erin [name]: Erin [name] NE, A [name] Other Clinician: Referring Erin [name] Weeks in [name]: 23 Visit Information History Since Last Visit Added or deleted any medications: No Patient Arrived: Wheel Chair Any new allergies or adverse reactions: No Arrival Time: 14:47 Had a fall or experienced change in No Accompanied By: caregiver activities of daily living that may affect Transfer Assistance: EasyPivot Patient Lift risk of falls: Patient Identification Verified: Yes Signs or symptoms of abuse/neglect since last visito No Secondary Verification Process Completed: Yes Hospitalized since last visit: No Patient Requires Transmission-Based Precautions: No Implantable device outside of the clinic excluding No Patient Has Alerts: Yes cellular tissue based products placed in the center Patient Alerts: NON COMPRESSABLE since last visit: Has Dressing in Place as Prescribed: Yes Has Compression in Place as Prescribed: Yes Pain Present Now: No Electronic Signature(s) Signed: 09/23/2019 7:38:35 AM By: Erin [name] Entered By: Erin [name] on 09/22/2019 14:56:15 -------------------------------------------------------------------------------- Compression Therapy Details Patient Name: Date of Service: Erin [name] [name] 09/22/2019 2:30 PM Medical Record Number: [id number] Patient Account Number: [id number] Date of Birth/Sex: Treating RN: 1930/10/01 (86 y.o. Erin [name] Primary Care Chealsea Paske: Erin [name] NE, A [name] Other Clinician: Referring Erin [name]: Treating Erin [name]/Extender: Erin [name] NE, A  [name] Weeks in [name]: 23 Compression Therapy Performed for Wound Assessment: Wound #6 Right Lower Leg Performed By: Clinician Erin Hurst, RN Compression Type: Three Layer Post Procedure Diagnosis Same as Pre-procedure Electronic Signature(s) Signed: 09/22/2019 5:04:17 PM By: Erin Hurst RN, BSN Entered By: Erin [name] on 09/22/2019 15:52:19 -------------------------------------------------------------------------------- Compression Therapy Details Patient Name: Date of Service: Erin [name] [name] 09/22/2019 2:30 PM Medical Record Number: [id number] Patient Account Number: [id number] Date of Birth/Sex: Treating RN: 1931-01-28 (86 y.o. Erin [name] Primary Care Hollin Crewe: Erin [name] NE, A [name] Other Clinician: Referring Kellye Mizner: Treating Dawit Tankard/Extender: Erin [name]: 23 Compression Therapy Performed for Wound Assessment: Wound #8 Left,Anterior Lower Leg Performed By: Clinician Erin Hurst, RN Compression Type: Three Layer Post Procedure Diagnosis Same as Pre-procedure Electronic Signature(s) Signed: 09/22/2019 5:04:17 PM By: Erin Hurst RN, BSN Entered By: Erin [name] on 09/22/2019 15:52:19 -------------------------------------------------------------------------------- Encounter Discharge Information Details Patient Name: Date of Service: Erin [name] [name] 09/22/2019 2:30 PM Medical Record Number: [id number] Patient Account Number: [id number] Date of Birth/Sex: Treating RN: [birth year] (86 y.o. Erin [name] Primary Care Jamilet Ambroise: Tedra Coupe, A [name] Other Clinician: Referring Jaziah Goeller: Treating Shanard Treto/Extender: Erin [name]: 23 Encounter Discharge Information Items Post Procedure Vitals Discharge Condition: Stable Temperature (F): 98.3 Ambulatory Status: Wheelchair Pulse (bpm): 70 Discharge Destination: Home Respiratory Rate (breaths/min):  18 Transportation: Other Blood Pressure (mmHg): 166/66 Accompanied By: Erin [name] Schedule Follow-up Appointment: Yes Clinical Summary of Care: Patient Declined Notes transportation service Electronic Signature(s) Signed: 09/23/2019 5:34:53 PM By: [name] RN, BSN Entered By: [name] on 09/22/2019 16:26:11 -------------------------------------------------------------------------------- Lower Extremity Assessment Details Patient Name: Date of Service: Erin [name] [name] 09/22/2019 2:30 PM Medical Record Number: [id number] Patient Account Number: [id number] Date of Birth/Sex: Treating RN: 1930-04-05 (86 y.o. Erin [name] Primary Care Dub Maclellan: Erin [name] NE, A  [name] Other Clinician: Referring Nochum Fenter: Treating Levada Bowersox/Extender: Erin [name]: 23 Edema Assessment Assessed: [Left: No] [Right: No] Edema: [Left: Ye] [Right: s] Calf Left: Right: Point of Measurement: 35 cm From Medial Instep 29 cm 28 cm Ankle Left: Right: Point of Measurement: 7 cm From Medial Instep 22 cm 23.5 cm Vascular Assessment Pulses: Dorsalis Pedis Palpable: [Left:Yes] [Right:Yes] Electronic Signature(s) Signed: 09/23/2019 7:38:35 AM By: Erin [name] Entered By: Erin [name] on 09/22/2019 15:02:58 -------------------------------------------------------------------------------- Multi Wound Chart Details Patient Name: Date of Service: Erin [name] [name] 09/22/2019 2:30 PM Medical Record Number: [id number] Patient Account Number: [id number] Date of Birth/Sex: Treating RN: January 30, 1931 (86 y.o. Erin [name] Primary Care Sharief Wainwright: Erin [name] NE, A [name] Other Clinician: Referring Kelcy Laible: Treating Emmalin Jaquess/Extender: Erin [name]: 23 Vital Signs Height(in): 60 Pulse(bpm): 70 Weight(lbs): 130 Blood Pressure(mmHg): 166/66 Body Mass Index(BMI): 25 Temperature(F): 98.3 Respiratory  Rate(breaths/min): 19 Photos: [6:No Photos Right Lower Leg] [8:No Photos Left, Anterior Lower Leg] [N/A:N/A N/A] Wound Location: [6:Gradually Appeared] [8:Trauma] [N/A:N/A] Wounding Event: [6:Lymphedema] [8:Trauma, Other] [N/A:N/A] Primary Etiology: [6:N/A] [8:Lymphedema] [N/A:N/A] Secondary Etiology: [6:Cataracts, Glaucoma, Middle ear] [8:Cataracts, Glaucoma, Middle ear] [N/A:N/A] Comorbid History: [6:problems, Anemia, Hypertension 03/14/2019] [8:problems, Anemia, Hypertension 09/04/2019] [N/A:N/A] Date Acquired: [6:23] [8:2] [N/A:N/A] Weeks of [name]: [6:Open] [8:Open] [N/A:N/A] Wound Status: [6:10.9x13.2x0.1] [8:1.2x2x0.1] [N/A:N/A] Measurements L x W x D (cm) [1:103.159] [8:1.885] [N/A:N/A] A (cm) : rea [6:11.3] [8:0.188] [N/A:N/A] Volume (cm) : [6:15.90%] [8:74.60%] [N/A:N/A] % Reduction in Area: [6:79.00%] [8:74.70%] [N/A:N/A] % Reduction in Volume: [6:Full Thickness Without Exposed] [8:Full Thickness Without Exposed] [N/A:N/A] Classification: [6:Support Structures Large] [8:Support Structures Medium] [N/A:N/A] Exudate Amount: [6:Serosanguineous] [8:Serosanguineous] [N/A:N/A] Exudate Type: [6:red, brown] [8:red, brown] [N/A:N/A] Exudate Color: [6:Distinct, outline attached] [8:Distinct, outline attached] [N/A:N/A] Wound Margin: [6:Medium (34-66%)] [8:Large (67-100%)] [N/A:N/A] Granulation Amount: [6:Red, Pink] [8:Red] [N/A:N/A] Granulation Quality: [6:Medium (34-66%)] [8:Small (1-33%)] [N/A:N/A] Necrotic Amount: [6:Fat Layer (Subcutaneous Tissue)] [8:Fat Layer (Subcutaneous Tissue)] [N/A:N/A] Exposed Structures: [6:Exposed: Yes Fascia: No Tendon: No Muscle: No Joint: No Bone: No Medium (34-66%)] [8:Exposed: Yes Fascia: No Tendon: No Muscle: No Joint: No Bone: No None] [N/A:N/A] Epithelialization: [6:Debridement - Excisional] [8:N/A] [N/A:N/A] Debridement: Pre-procedure Verification/Time Out 15:46 [8:N/A] [N/A:N/A] Taken: [6:Subcutaneous, [name]] [8:N/A] [N/A:N/A] Tissue  Debrided: [6:Skin/Subcutaneous Tissue] [8:N/A] [N/A:N/A] Level: [6:16] [8:N/A] [N/A:N/A] Debridement A (sq cm): [6:rea Curette] [8:N/A] [N/A:N/A] Instrument: [6:Minimum] [8:N/A] [N/A:N/A] Bleeding: [6:Pressure] [8:N/A] [N/A:N/A] Hemostasis A chieved: [6:4] [8:N/A] [N/A:N/A] Procedural Pain: [6:2] [8:N/A] [N/A:N/A] Post Procedural Pain: [6:Procedure was tolerated well] [8:N/A] [N/A:N/A] Debridement [name] Response: [6:10.9x13.2x0.1] [8:N/A] [N/A:N/A] Post Debridement Measurements L x W x D (cm) [6:11.3] [8:N/A] [N/A:N/A] Post Debridement Volume: (cm) [6:Compression Therapy] [8:Compression Therapy] [N/A:N/A] Procedures Performed: [6:Debridement] [name] Notes Electronic Signature(s) Signed: 09/22/2019 5:04:17 PM By: Erin Hurst RN, BSN Signed: 09/23/2019 8:12:39 AM By: Linton Ham MD Entered By: Linton Ham on 09/22/2019 16:05:19 -------------------------------------------------------------------------------- Multi-Disciplinary Care Plan Details Patient Name: Date of Service: Erin [name] [name] 09/22/2019 2:30 PM Medical Record Number: [id number] Patient Account Number: [id number] Date of Birth/Sex: Treating RN: Jul 15, 1930 (86 y.o. Erin [name] Primary Care Arlynn Mcdermid: Erin [name] NE, A [name] Other Clinician: Referring Offie Pickron: Treating Devesh Monforte/Extender: Erin [name]: 23 Active Inactive Wound/Skin Impairment Nursing Diagnoses: Impaired tissue integrity Knowledge deficit related to ulceration/compromised skin integrity Goals: Patient/caregiver will verbalize understanding of skin care regimen Date Initiated: 04/14/2019 Target Resolution Date: 10/10/2019 Goal Status: Active Ulcer/skin breakdown will have a volume reduction of 30%  by week 4 Date Initiated: 04/14/2019 Date Inactivated: 05/19/2019 Target Resolution Date: 05/16/2019 Goal Status: Met Ulcer/skin breakdown will have a volume reduction of 50% by week 8 Date Initiated:  05/19/2019 Date Inactivated: 06/16/2019 Target Resolution Date: 06/20/2019 Goal Status: Met Interventions: Assess patient/caregiver ability to obtain necessary supplies Assess patient/caregiver ability to perform ulcer/skin care regimen upon admission and as needed Assess ulceration(s) every visit Provide education on ulcer and skin care Notes: Electronic Signature(s) Signed: 09/22/2019 5:04:17 PM By: Erin Hurst RN, BSN Entered By: Erin [name] on 09/22/2019 16:58:24 -------------------------------------------------------------------------------- Pain Assessment Details Patient Name: Date of Service: Erin [name] [name] 09/22/2019 2:30 PM Medical Record Number: [id number] Patient Account Number: [id number] Date of Birth/Sex: Treating RN: 1930/09/27 (86 y.o. Erin [name] Primary Care Gustavo Meditz: Erin [name] NE, A [name] Other Clinician: Referring Bohden Dung: Treating Jaloni Sorber/Extender: Erin [name]: 23 Active Problems Location of Pain Severity and Description of Pain Patient Has Paino No Site Locations Pain Management and Medication Current Pain Management: Electronic Signature(s) Signed: 09/23/2019 7:38:35 AM By: Erin [name] Entered By: Erin [name] on 09/22/2019 14:56:42 -------------------------------------------------------------------------------- Patient/Caregiver Education Details Patient Name: Date of Service: Erin [name] [name] 7/12/2021andnbsp2:30 PM Medical Record Number: [id number] Patient Account Number: [id number] Date of Birth/Gender: Treating RN: 1930-09-13 (86 y.o. Erin [name] Primary Care Physician: Erin [name] NE, A [name] Other Clinician: Referring Physician: Treating Physician/Extender: Erin [name]: 23 Education Assessment Education Provided To: Patient Education Topics Provided Wound/Skin Impairment: Methods: Explain/Verbal Responses: State content  correctly Electronic Signature(s) Signed: 09/22/2019 5:04:17 PM By: Erin Hurst RN, BSN Entered By: Erin [name] on 09/22/2019 16:58:39 -------------------------------------------------------------------------------- Wound Assessment Details Patient Name: Date of Service: Erin [name] [name] 09/22/2019 2:30 PM Medical Record Number: [id number] Patient Account Number: [id number] Date of Birth/Sex: Treating RN: 01-13-1931 (86 y.o. Erin [name] Primary Care Adeyemi Hamad: Erin [name] NE, A [name] Other Clinician: Referring Teegan Guinther: Treating Garyn Waguespack/Extender: Erin [name]: 23 Wound Status Wound Number: 6 Primary Lymphedema Etiology: Wound Location: Right Lower Leg Wound Status: Open Wounding Event: Gradually Appeared Comorbid Cataracts, Glaucoma, Middle ear problems, Anemia, Date Acquired: 03/14/2019 History: Hypertension Weeks Of [name]: 23 Clustered Wound: No Photos Photo Uploaded By: Mikeal Hawthorne on 09/23/2019 15:25:36 Wound Measurements Length: (cm) 10.9 Width: (cm) 13.2 Depth: (cm) 0.1 Area: (cm) 113.003 Volume: (cm) 11.3 % Reduction in Area: 15.9% % Reduction in Volume: 79% Epithelialization: Medium (34-66%) Tunneling: No Undermining: No Wound Description Classification: Full Thickness Without Exposed Support Structures Wound Margin: Distinct, outline attached Exudate Amount: Large Exudate Type: Serosanguineous Exudate Color: red, brown Foul Odor After Cleansing: No [name]/Fibrino Yes Wound Bed Granulation Amount: Medium (34-66%) Exposed Structure Granulation Quality: Red, Pink Fascia Exposed: No Necrotic Amount: Medium (34-66%) Fat Layer (Subcutaneous Tissue) Exposed: Yes Necrotic Quality: Adherent [name] Tendon Exposed: No Muscle Exposed: No Joint Exposed: No Bone Exposed: No [name] Notes Wound #6 (Right Lower Leg) 2. Periwound Care Barrier cream Moisturizing lotion 3. Primary Dressing  Applied Hydrofera Blue 4. Secondary Dressing ABD Pad 6. Support Layer Applied 3 layer compression wrap Electronic Signature(s) Signed: 09/23/2019 7:38:35 AM By: Erin [name] Entered By: Erin [name] on 09/22/2019 15:04:41 -------------------------------------------------------------------------------- Wound Assessment Details Patient Name: Date of Service: Erin [name] [name] 09/22/2019 2:30 PM Medical Record Number: [id number] Patient Account Number: [id number] Date of Birth/Sex: Treating RN: 1931-02-24 (86 y.o. Erin [name] Primary Care Wilhelmena Zea: BO O NE,  A [name] Other Clinician: Referring Markees Carns: Treating Joel Mericle/Extender: Erin [name]: 23 Wound Status Wound Number: 8 Primary Etiology: Trauma, Other Wound Location: Left, Anterior Lower Leg Secondary Lymphedema Etiology: Wounding Event: Trauma Wound Status: Open Date Acquired: 09/04/2019 Comorbid Cataracts, Glaucoma, Middle ear problems, Anemia, Weeks Of [name]: 2 History: Hypertension Clustered Wound: No Photos Photo Uploaded By: Mikeal Hawthorne on 09/23/2019 15:25:53 Wound Measurements Length: (cm) 1.2 Width: (cm) 2 Depth: (cm) 0.1 Area: (cm) 1.885 Volume: (cm) 0.188 % Reduction in Area: 74.6% % Reduction in Volume: 74.7% Epithelialization: None Tunneling: No Undermining: No Wound Description Classification: Full Thickness Without Exposed Support Structu Wound Margin: Distinct, outline attached Exudate Amount: Medium Exudate Type: Serosanguineous Exudate Color: red, brown Wound Bed Granulation Amount: Large (67-100%) Granulation Quality: Red Necrotic Amount: Small (1-33%) Necrotic Quality: Adherent [name] res [employer] Odor After Cleansing: No [name]/Fibrino Yes Exposed Structure Fascia Exposed: No Fat Layer (Subcutaneous Tissue) Exposed: Yes Tendon Exposed: No Muscle Exposed: No Joint Exposed: No Bone Exposed: No [name] Notes Wound  #8 (Left, Anterior Lower Leg) 2. Periwound Care Moisturizing lotion 3. Primary Dressing Applied Hydrofera Blue 4. Secondary Dressing Dry Gauze 6. Support Layer Applied 3 layer compression wrap Electronic Signature(s) Signed: 09/23/2019 7:38:35 AM By: Erin [name] Entered By: Erin [name] on 09/22/2019 15:05:58 -------------------------------------------------------------------------------- Vitals Details Patient Name: Date of Service: [name], Darcel Bayley [name] 09/22/2019 2:30 PM Medical Record Number: [id number] Patient Account Number: [id number] Date of Birth/Sex: Treating RN: 07/05/1930 (86 y.o. Erin [name] Primary Care Dante Roudebush: Erin [name] NE, A [name] Other Clinician: Referring Kelseigh Diver: Treating Charlina Dwight/Extender: Erin [name]: 23 Vital Signs Time Taken: 14:56 Temperature (F): 98.3 Height (in): 60 Pulse (bpm): 70 Weight (lbs): 130 Respiratory Rate (breaths/min): 19 Body Mass Index (BMI): 25.4 Blood Pressure (mmHg): 166/66 Reference Range: 80 - 120 mg / dl Electronic Signature(s) Signed: 09/23/2019 7:38:35 AM By: Erin [name] Entered By: Erin [name] on 09/22/2019 14:56:35

## 2019-09-23 NOTE — Progress Notes (Addendum)
[name], [name] ([id number]) Visit Report for 09/22/2019 Debridement Details Patient Name: Date of Service: [name] RLO TTE Erin. 09/22/2019 2:30 PM Medical Record Number: [id number] Patient Account Number: [id number] Date of Birth/Sex: Treating RN: 1930/07/03 (86 y.o. Erin [name] Primary Care Provider: Henderson Cloud NE, [place] [name] Other Clinician: Referring Provider: Treating Provider/Extender: Erin [name] [name] in Treatment: 23 Debridement Performed for Assessment: Wound #6 Right Lower Leg Performed By: Physician [place] [name]., MD Debridement Type: Debridement Level of Consciousness (Pre-procedure): Awake and Alert Pre-procedure Verification/Time Out Yes - 15:46 Taken: Start Time: 15:46 T Area Debrided (L x W): otal 4 (cm) x 4 (cm) = 16 (cm) Tissue and other material debrided: Viable, Non-Viable, [name], Subcutaneous, [name] Level: Skin/Subcutaneous Tissue Debridement Description: Excisional Instrument: Curette Bleeding: Minimum Hemostasis Achieved: Pressure End Time: 15:47 Procedural Pain: 4 Post Procedural Pain: 2 Response to Treatment: Procedure was tolerated well Level of Consciousness (Post- Awake and Alert procedure): Post Debridement Measurements of Total Wound Length: (cm) 10.9 Width: (cm) 13.2 Depth: (cm) 0.1 Volume: (cm) 11.3 Character of Wound/Ulcer Post Debridement: Improved Post Procedure Diagnosis Same as Pre-procedure Electronic Signature(s) Signed: 09/22/2019 5:04:17 PM By: Erin Hurst RN, BSN Signed: 09/23/2019 8:12:39 AM By: Erin Ham MD Entered By: Erin [name] on 09/22/2019 16:05:42 -------------------------------------------------------------------------------- HPI Details Patient Name: Date of Service: Erin [name] RLO TTE Erin. 09/22/2019 2:30 PM Medical Record Number: [id number] Patient Account Number: [id number] Date of Birth/Sex: Treating RN: [birth year] (85 y.o. Erin [name] Primary Care Provider: Henderson Cloud NE, [place] [name] Other Clinician: Referring Provider: Treating Provider/Extender: Erin [name] [name] in Treatment: 23 History of Present Illness HPI Description: We to find Erin new wound on the left 03/22/17; this is an 86 year old woman. There is not Erin lot of information in care everywhere on this patient. She had Erin history of Erin malignant neoplasm her left breast for which she had lumpectomy but she did not have radiation. Apparently she has had long- standing edema and her lower legs and she was seen in the wound care [name] at [name] in [name] by Dr. Nils [name] October. Both her legs were wrapped however it sounds as though the left leg became secondarily infected she apparently had MRSA and she was admitted the [name]. Not really turn for wound care. She lives at home on her own and has apparently [name] home health care. We are not exactly clear what Erin [name] is doing to her legs but it does not involve compression. She is not Erin diabetic. ABIs in our clinic were noncompressible bilaterally 03/30/17; this is Erin patient who has bilateral chronic venous insufficiency, severe venous inflammation. We had Erin nice description number legs from Erin note by [name] dating 2010 describing edematous warm legs with erythema. This suggests that she has chronic venous insufficiency with inflammation.. We have arterial studies dated 12/29/16 again from [name]. This showed Erin right ABI at 1 and Erin TBI of 0.81 on the left the ABI was 1.25 and Erin TBI of 0.72. This was just she does not have significant arterial insufficiency. She had normal triphasic waveforms noted that she was admitted to [name] in late October with MSSA cellulitis Last visit we wrapped both her legs. She had an open area predominantly on the left anterior but weeping edema on the right anterior leg. She has done well there is only one at anterior left tibial wound 04/06/17; the patient has no open area on  her right leg. Her remaining wound is on the left anterior leg, the left posterior leg wound is healed. We have been using silver alginate changed to [place] [name] on the left leg today she has home health 04/13/17; still no open area on her right leg. Her remaining wound on the left anterior leg. We have been using using Hydrofera Blue 04/20/17; no open area on the right leg although the edema here is somewhat disfiguring. Her remaining wound is on the left anterior leg we've been using Hydrofera Blue with improvement in dimensions. She has [place] [name] home health and lives in [name]. She requests to come every 2 [name] because of the distance involved in coming here 05/04/17; there is no open area on the right leg. She has Erin very small left anterior leg wound that remains. However what remains on the left still has some depth and Erin not to viable looking surface. It is too small to really attempt debridement. She has home health coming out to her home in [name] 05/18/17; there is no open area on either leg. She has significant chronic venous insufficiency. The left anterior leg wound that was still open 2 [name] ago has closed. She has home health coming out. We are going to order Juxtalite stockings for both legs. She is agreed to pay for these privately. We will order them from [name] READMISSION 04/14/2019 This is Erin patient we previously had in this clinic in 2019 for 3 months. She had Erin wound on the left anterior greater than right anterior leg. According to my notes we discharged her and juxta lite stockings although it does not appear that she ever got them. The history here is Erin bit difficult. She apparently has had Erin wound on the left lower leg for the last 6 months. She required admission to [name] in [name] for apparent cellulitis just before Christmas was discharged home she has an open wound. She has Erin home health agency although we are not exactly sure which one. They are applying  silver alginate. No compression Past medical history is reviewed she has chronic venous insufficiency and Erin history of left breast CA. She also has Erin history of MRSA in the wound ABIs were not done in the clinic today however she did have noninvasive arterial studies in 2018 which are actually quite good. She did not have any compression on the wounds today. 2/8; substantial wound on the right posterior calf and Erin smaller area on the left medial calf. We are using Iodoflex to both wound areas under compression. We have better edema control We to find Erin new wound on the left anterior medial calf 2/15; the area on the left medial calf is closed over. Substantial area on the right posterior and lateral calf. We have been using Iodoflex although the patient complains of pain and wonders if there is not an alternative. She has home health changing the dressing twice Erin week and she sees [place] once 2/22; the wound on the left medial calf is closed over. Her edema control is not good here but she cannot get on stockings. This substantial wound is on the right lateral and right posterior calf. Changed her to Erin hydrofera Blue last week. Gradual improvement in wound area. 3/8; wound on the left leg remains closed. The substantial wound is on the right lateral and right posterior calf. This is measuring smaller 3/22; 2-week follow-up. Substantial area on the right posterior and lateral calf measures slightly smaller. We  are using Hydrofera Blue under compression 4/5; 2-week follow-up. Substantial area on the right posterior lateral calf. This measures slightly smaller. She has islands of epithelialization we have been using Hydrofera Blue under compression 4/19; 2-week follow-up. We have been using Hydrofera Blue however there is maceration around the wound. I will change to silver alginate today. She has islands of epithelialization however I did not see much evidence that things were improving here. 5/6; 2-week  follow-up. I put silver alginate on this because of maceration around the wound last time. However she comes in today with Erin lot of very adherent debris on the wound of the right lateral leg. 5/17; 2-week follow-up. Using Hydrofera Blue under compression. The patient has epithelialization including islands of epithelialization in the middle of fairly substantial wound area on the right posterior lateral calf 6/1; 2-week follow-up. Using Hydrofera Blue under compression. She seems to have increasing epithelialization. This is an irregular wound going from medial to lateral posteriorly in the right posterior calf. This would not be easy wound to get reproducible measurement 08/25/2019 upon evaluation today patient appears to actually be doing better in regard to the wound on her leg at this point. We have been using Adaptic followed by [name] this is keeping her from getting stuck. The patient is aware that this is also potentially slowing down her healing simply due to the fact that the [name] not in direct contact with the wound bed. Nonetheless she was not happy with the way it was sticking previous which is why we switched to this according to what I can read in the note and hear from the patient and her aide with her today. 6/28; 2-week follow-up. We are following this patient for severe wound on the right lower leg secondary to chronic venous insufficiency using Hydrofera Blue under compression we are making gradual improvements. She arrives in clinic today with what looks like Erin contusion on the left anterior mid tibia. She says she was washing her leg and pulled some skin. However there is Erin fairly large wound with surrounding erythema and some swelling 7/12; 2-week follow-up. Right lower leg wound not quite as good as I thought. There is still islands of epithelialization we are using Hydrofera Blue with Erin contact layer I would like to see if we can get rid of the contact layer  at this point. The new area from last time is Erin lot smaller She did not take the cephalexin I initially prescribed for the erythema edema around the left leg due to fears about diarrhea. She did tolerate doxycycline that we subsequently called in Electronic Signature(s) Signed: 09/23/2019 8:12:39 AM By: Erin Ham MD Entered By: Erin [name] on 09/22/2019 16:06:59 -------------------------------------------------------------------------------- Physical Exam Details Patient Name: Date of Service: Erin [name] RLO TTE Erin. 09/22/2019 2:30 PM Medical Record Number: [id number] Patient Account Number: [id number] Date of Birth/Sex: Treating RN: 30-Jan-1931 (86 y.o. Erin [name] Primary Care Provider: Henderson Cloud NE, Erin [name] Other Clinician: Referring Provider: Treating Provider/Extender: Erin [name] in Treatment: 23 Constitutional Patient is hypertensive.. Pulse regular and within target range for patient.Marland Kitchen Respirations regular, non-labored and within target range.. Temperature is normal and within the target range for the patient.Marland Kitchen Appears in no distress. Cardiovascular Pedal pulses palpable. We have good edema control. Notes Wound exam; the patient's original wound looks about the same. In the upper medial part of this large wound I debrided with an open curette adherent necrotic debris.  The rest of this appears to be slowly epithelializing especially in some areas although the islands of epithelialization were not any larger. Electronic Signature(s) Signed: 09/23/2019 8:12:39 AM By: Erin Ham MD Entered By: Erin [name] on 09/22/2019 16:08:58 -------------------------------------------------------------------------------- Physician Orders Details Patient Name: Date of Service: Erin [name] RLO TTE Erin. 09/22/2019 2:30 PM Medical Record Number: [id number] Patient Account Number: [id number] Date of Birth/Sex: Treating RN: 11/24/1930 (86 y.o. Erin [name] Primary Care Provider: Henderson Cloud NE, Erin [name] Other Clinician: Referring Provider: Treating Provider/Extender: Erin [name] [name] in Treatment: 23 Verbal / Phone Orders: No Diagnosis Coding ICD-10 Coding Code Description I87.331 Chronic venous hypertension (idiopathic) with ulcer and inflammation of right lower extremity L97.812 Non-pressure chronic ulcer of other part of right lower leg with fat layer exposed L97.822 Non-pressure chronic ulcer of other part of left lower leg with fat layer exposed L03.116 Cellulitis of left lower limb I89.0 Lymphedema, not elsewhere classified Follow-up Appointments Return Appointment in 2 [name]. Dressing Change Frequency Wound #6 Right Lower Leg Change dressing three times week. - by home health. Home health to change 2x Erin week on week that patient has appt at wound clinic. Wound #8 Left,Anterior Lower Leg Change dressing three times week. - by home health. Home health to change 2x Erin week on week that patient has appt at wound clinic. Skin Barriers/Peri-Wound Care Barrier cream - to excoriated areas Moisturizing lotion - both legs Wound Cleansing May shower with protection. - use cast protector Primary Wound Dressing Wound #6 Right Lower Leg Hydrofera Blue - Ready - NO ADAPTIC UNDER HYDROFERA Wound #8 Left,Anterior Lower Leg Hydrofera Blue - Ready - NO ADAPTIC UNDER HYDROFERA Secondary Dressing Wound #6 Right Lower Leg ABD pad Zetuvit or Kerramax - or Xtrasorb (or other superabsorbent dressing) Wound #8 Left,Anterior Lower Leg Dry Gauze Edema Control 3 Layer Compression System - Bilateral Avoid standing for long periods of time Elevate legs to the level of the heart or above for 30 minutes daily and/or when sitting, Erin frequency of: - throughout the day [facility] for wound care. - [facility] Electronic Signature(s) Signed: 09/22/2019 5:04:17 PM By: Erin Hurst RN, BSN Signed:  09/23/2019 8:12:39 AM By: Erin Ham MD Entered By: Erin [name] on 09/22/2019 15:51:39 -------------------------------------------------------------------------------- Problem List Details Patient Name: Date of Service: Erin [name] RLO TTE Erin. 09/22/2019 2:30 PM Medical Record Number: [id number] Patient Account Number: [id number] Date of Birth/Sex: Treating RN: Jul 27, 1930 (86 y.o. Erin [name] Primary Care Provider: Henderson Cloud NE, [place] [name] Other Clinician: Referring Provider: Treating Provider/Extender: Erin [name] in Treatment: 23 Active Problems ICD-10 Encounter Code Description Active Date MDM Diagnosis I87.331 Chronic venous hypertension (idiopathic) with ulcer and inflammation of right 04/14/2019 No Yes lower extremity L97.812 Non-pressure chronic ulcer of other part of right lower leg with fat layer 04/14/2019 No Yes exposed L97.822 Non-pressure chronic ulcer of other part of left lower leg with fat layer exposed6/28/2021 No Yes L03.116 Cellulitis of left lower limb 09/08/2019 No Yes I89.0 Lymphedema, not elsewhere classified 04/14/2019 No Yes Inactive Problems ICD-10 Code Description Active Date Inactive Date L97.821 Non-pressure chronic ulcer of other part of left lower leg limited to breakdown of skin 04/21/2019 04/21/2019 Resolved Problems Electronic Signature(s) Signed: 09/23/2019 8:12:39 AM By: Erin Ham MD Entered By: Erin [name] on 09/22/2019 16:05:06 -------------------------------------------------------------------------------- Progress Note Details Patient Name: Date of Service: Erin [name], CHA RLO TTE Erin. 09/22/2019 2:30 PM  Medical Record Number: [id number] Patient Account Number: [id number] Date of Birth/Sex: Treating RN: [birth year] (86 y.o. Erin [name] Primary Care Provider: Henderson Cloud NE, [place] [name] Other Clinician: Referring Provider: Treating Provider/Extender: Erin [name] [name] in Treatment:  23 Subjective History of Present Illness (HPI) We to find Erin new wound on the left 03/22/17; this is an 86 year old woman. There is not Erin lot of information in care everywhere on this patient. She had Erin history of Erin malignant neoplasm her left breast for which she had lumpectomy but she did not have radiation. Apparently she has had long-standing edema and her lower legs and she was seen in the wound care [name] at [name] in [place] by Dr. Nils [name] October. Both her legs were wrapped however it sounds as though the left leg became secondarily infected she apparently had MRSA and she was admitted the [name]. Not really turn for wound care. She lives at home on her own and has apparently [place] [name] home health care. We are not exactly clear what Erin [name] is doing to her legs but it does not involve compression. She is not Erin diabetic. ABIs in our clinic were noncompressible bilaterally 03/30/17; this is Erin patient who has bilateral chronic venous insufficiency, severe venous inflammation. We had Erin nice description number legs from Erin note by [name] dating 2010 describing edematous warm legs with erythema. This suggests that she has chronic venous insufficiency with inflammation.. We have arterial studies dated 12/29/16 again from [name]. This showed Erin right ABI at 1 and Erin TBI of 0.81 on the left the ABI was 1.25 and Erin TBI of 0.72. This was just she does not have significant arterial insufficiency. She had normal triphasic waveforms noted that she was admitted to [name] in late October with MSSA cellulitis Last visit we wrapped both her legs. She had an open area predominantly on the left anterior but weeping edema on the right anterior leg. She has done well there is only one at anterior left tibial wound 04/06/17; the patient has no open area on her right leg. Her remaining wound is on the left anterior leg, the left posterior leg wound is healed. We have been using silver  alginate changed to [name] on the left leg today she has home health 04/13/17; still no open area on her right leg. Her remaining wound on the left anterior leg. We have been using using Hydrofera Blue 04/20/17; no open area on the right leg although the edema here is somewhat disfiguring. Her remaining wound is on the left anterior leg we've been using Hydrofera Blue with improvement in dimensions. She has [place] [name] home health and lives in [place]. She requests to come every 2 [name] because of the distance involved in coming here 05/04/17; there is no open area on the right leg. She has Erin very small left anterior leg wound that remains. However what remains on the left still has some depth and Erin not to viable looking surface. It is too small to really attempt debridement. She has home health coming out to her home in [name] 05/18/17; there is no open area on either leg. She has significant chronic venous insufficiency. The left anterior leg wound that was still open 2 [name] ago has closed. She has home health coming out. We are going to order Juxtalite stockings for both legs. She is agreed to pay for these privately. We will order them from [name] READMISSION 04/14/2019 This is  Erin patient we previously had in this clinic in 2019 for 3 months. She had Erin wound on the left anterior greater than right anterior leg. According to my notes we discharged her and juxta lite stockings although it does not appear that she ever got them. The history here is Erin bit difficult. She apparently has had Erin wound on the left lower leg for the last 6 months. She required admission to [name] for apparent cellulitis just before Christmas was discharged home she has an open wound. She has Erin home health agency although we are not exactly sure which one. They are applying silver alginate. No compression Past medical history is reviewed she has chronic venous insufficiency and Erin history of left breast  CA. She also has Erin history of MRSA in the wound ABIs were not done in the clinic today however she did have noninvasive arterial studies in 2018 which are actually quite good. She did not have any compression on the wounds today. 2/8; substantial wound on the right posterior calf and Erin smaller area on the left medial calf. We are using Iodoflex to both wound areas under compression. We have better edema control We to find Erin new wound on the left anterior medial calf 2/15; the area on the left medial calf is closed over. Substantial area on the right posterior and lateral calf. We have been using Iodoflex although the patient complains of pain and wonders if there is not an alternative. She has home health changing the dressing twice Erin week and she sees [place] once 2/22; the wound on the left medial calf is closed over. Her edema control is not good here but she cannot get on stockings. This substantial wound is on the right lateral and right posterior calf. Changed her to Erin hydrofera Blue last week. Gradual improvement in wound area. 3/8; wound on the left leg remains closed. The substantial wound is on the right lateral and right posterior calf. This is measuring smaller 3/22; 2-week follow-up. Substantial area on the right posterior and lateral calf measures slightly smaller. We are using Hydrofera Blue under compression 4/5; 2-week follow-up. Substantial area on the right posterior lateral calf. This measures slightly smaller. She has islands of epithelialization we have been using Hydrofera Blue under compression 4/19; 2-week follow-up. We have been using Hydrofera Blue however there is maceration around the wound. I will change to silver alginate today. She has islands of epithelialization however I did not see much evidence that things were improving here. 5/6; 2-week follow-up. I put silver alginate on this because of maceration around the wound last time. However she comes in today with Erin lot of  very adherent debris on the wound of the right lateral leg. 5/17; 2-week follow-up. Using Hydrofera Blue under compression. The patient has epithelialization including islands of epithelialization in the middle of fairly substantial wound area on the right posterior lateral calf 6/1; 2-week follow-up. Using Hydrofera Blue under compression. She seems to have increasing epithelialization. This is an irregular wound going from medial to lateral posteriorly in the right posterior calf. This would not be easy wound to get reproducible measurement 08/25/2019 upon evaluation today patient appears to actually be doing better in regard to the wound on her leg at this point. We have been using Adaptic followed by [name] this is keeping her from getting stuck. The patient is aware that this is also potentially slowing down her healing simply due to the fact that the [name]  Blue not in direct contact with the wound bed. Nonetheless she was not happy with the way it was sticking previous which is why we switched to this according to what I can read in the note and hear from the patient and her aide with her today. 6/28; 2-week follow-up. We are following this patient for severe wound on the right lower leg secondary to chronic venous insufficiency using Hydrofera Blue under compression we are making gradual improvements. She arrives in clinic today with what looks like Erin contusion on the left anterior mid tibia. She says she was washing her leg and pulled some skin. However there is Erin fairly large wound with surrounding erythema and some swelling 7/12; 2-week follow-up. Right lower leg wound not quite as good as I thought. There is still islands of epithelialization we are using Hydrofera Blue with Erin contact layer I would like to see if we can get rid of the contact layer at this point. ooThe new area from last time is Erin lot smaller ooShe did not take the cephalexin I initially prescribed for the  erythema edema around the left leg due to fears about diarrhea. She did tolerate doxycycline that we subsequently called in Objective Constitutional Patient is hypertensive.. Pulse regular and within target range for patient.Marland Kitchen Respirations regular, non-labored and within target range.. Temperature is normal and within the target range for the patient.Marland Kitchen Appears in no distress. Vitals Time Taken: 2:56 PM, Height: 60 in, Weight: 130 lbs, BMI: 25.4, Temperature: 98.3 F, Pulse: 70 bpm, Respiratory Rate: 19 breaths/min, Blood Pressure: 166/66 mmHg. Cardiovascular Pedal pulses palpable. We have good edema control. General Notes: Wound exam; the patient's original wound looks about the same. In the upper medial part of this large wound I debrided with an open curette adherent necrotic debris. The rest of this appears to be slowly epithelializing especially in some areas although the islands of epithelialization were not any larger. Integumentary (Hair, Skin) Wound #6 status is Open. Original cause of wound was Gradually Appeared. The wound is located on the Right Lower Leg. The wound measures 10.9cm length x 13.2cm width x 0.1cm depth; 113.003cm^2 area and 11.3cm^3 volume. There is Fat Layer (Subcutaneous Tissue) Exposed exposed. There is no tunneling or undermining noted. There is Erin large amount of serosanguineous drainage noted. The wound margin is distinct with the outline attached to the wound base. There is medium (34-66%) red, pink granulation within the wound bed. There is Erin medium (34-66%) amount of necrotic tissue within the wound bed including Adherent [name]. Wound #8 status is Open. Original cause of wound was Trauma. The wound is located on the Left,Anterior Lower Leg. The wound measures 1.2cm length x 2cm width x 0.1cm depth; 1.885cm^2 area and 0.188cm^3 volume. There is Fat Layer (Subcutaneous Tissue) Exposed exposed. There is no tunneling or undermining noted. There is Erin medium amount  of serosanguineous drainage noted. The wound margin is distinct with the outline attached to the wound base. There is large (67-100%) red granulation within the wound bed. There is Erin small (1-33%) amount of necrotic tissue within the wound bed including Adherent [name]. Assessment Active Problems ICD-10 Chronic venous hypertension (idiopathic) with ulcer and inflammation of right lower extremity Non-pressure chronic ulcer of other part of right lower leg with fat layer exposed Non-pressure chronic ulcer of other part of left lower leg with fat layer exposed Cellulitis of left lower limb Lymphedema, not elsewhere classified Procedures Wound #6 Pre-procedure diagnosis of Wound #6 is Erin Lymphedema located  on the Right Lower Leg . There was Erin Excisional Skin/Subcutaneous Tissue Debridement with Erin total area of 16 sq cm performed by Erin [name]., MD. With the following instrument(s): Curette to remove Viable and Non-Viable tissue/material. Material removed includes Subcutaneous Tissue and [name] and. No specimens were taken. Erin time out was conducted at 15:46, prior to the start of the procedure. Erin Minimum amount of bleeding was controlled with Pressure. The procedure was tolerated well with Erin pain level of 4 throughout and Erin pain level of 2 following the procedure. Post Debridement Measurements: 10.9cm length x 13.2cm width x 0.1cm depth; 11.3cm^3 volume. Character of Wound/Ulcer Post Debridement is improved. Post procedure Diagnosis Wound #6: Same as Pre-Procedure Pre-procedure diagnosis of Wound #6 is Erin Lymphedema located on the Right Lower Leg . There was Erin Three Layer Compression Therapy Procedure by Erin Hurst, RN. Post procedure Diagnosis Wound #6: Same as Pre-Procedure Wound #8 Pre-procedure diagnosis of Wound #8 is Erin Trauma, Other located on the Left,Anterior Lower Leg . There was Erin Three Layer Compression Therapy Procedure by Erin Hurst, RN. Post procedure Diagnosis Wound  #8: Same as Pre-Procedure Plan Follow-up Appointments: Return Appointment in 2 [name]. Dressing Change Frequency: Wound #6 Right Lower Leg: Change dressing three times week. - by home health. Home health to change 2x Erin week on week that patient has appt at wound clinic. Wound #8 Left,Anterior Lower Leg: Change dressing three times week. - by home health. Home health to change 2x Erin week on week that patient has appt at wound clinic. Skin Barriers/Peri-Wound Care: Barrier cream - to excoriated areas Moisturizing lotion - both legs Wound Cleansing: May shower with protection. - use cast protector Primary Wound Dressing: Wound #6 Right Lower Leg: Hydrofera Blue - Ready - NO ADAPTIC UNDER HYDROFERA Wound #8 Left,Anterior Lower Leg: Hydrofera Blue - Ready - NO ADAPTIC UNDER HYDROFERA Secondary Dressing: Wound #6 Right Lower Leg: ABD pad Zetuvit or Kerramax - or Xtrasorb (or other superabsorbent dressing) Wound #8 Left,Anterior Lower Leg: Dry Gauze Edema Control: 3 Layer Compression System - Bilateral Avoid standing for long periods of time Elevate legs to the level of the heart or above for 30 minutes daily and/or when sitting, Erin frequency of: - throughout the day Home Health: [place] [facility] for wound care. - [facility] 1. I change to [facility] Blue without the contact layer 2. Xtrasorb 3. 3 layer compression 4. The traumatic wound from last time on the left leg has gotten Erin lot better. No evidence of cellulitis she has completed her doxycycline Electronic Signature(s) Signed: 09/23/2019 8:12:39 AM By: Erin Ham MD Entered By: Erin [name] on 09/22/2019 16:10:12 -------------------------------------------------------------------------------- SuperBill Details Patient Name: Date of Service: Erin [name], CHA RLO TTE Erin. 09/22/2019 Medical Record Number: [id number] Patient Account Number: [id number] Date of Birth/Sex: Treating RN: [birth year] (86 y.o. Erin [name] Primary Care Provider: Henderson Cloud [place], Erin [name] Other Clinician: Referring Provider: Treating Provider/Extender: Erin [name] [name] in Treatment: 23 Diagnosis Coding ICD-10 Codes Code Description I87.331 Chronic venous hypertension (idiopathic) with ulcer and inflammation of right lower extremity L97.812 Non-pressure chronic ulcer of other part of right lower leg with fat layer exposed L97.822 Non-pressure chronic ulcer of other part of left lower leg with fat layer exposed L03.116 Cellulitis of left lower limb I89.0 Lymphedema, not elsewhere classified Facility Procedures CPT4 Code: 59563875 Description: 64332 - DEB SUBQ TISSUE 20 SQ CM/< ICD-10 Diagnosis Description L97.812 Non-pressure chronic ulcer of other  part of right lower leg with fat layer exposed Modifier: Quantity: 1 CPT4 Code: 45913685 Description: (Facility Use Only) [id number] - [facility] LWR LT LEG Modifier: 59 Quantity: 1 Physician Procedures : CPT4 Code Description Modifier [id number] 11042 - WC PHYS SUBQ TISS 20 SQ CM ICD-10 Diagnosis Description Q36.016 Non-pressure chronic ulcer of other part of right lower leg with fat layer exposed Quantity: 1 Electronic Signature(s) Signed: 09/22/2019 5:04:17 PM By: Erin Hurst RN, BSN Signed: 09/23/2019 8:12:39 AM By: Erin Ham MD Entered By: Erin [name] on 09/22/2019 16:59:00

## 2019-10-06 ENCOUNTER — Encounter (HOSPITAL_BASED_OUTPATIENT_CLINIC_OR_DEPARTMENT_OTHER): Payer: Medicare HMO | Admitting: Internal Medicine

## 2019-10-06 DIAGNOSIS — I87331 Chronic venous hypertension (idiopathic) with ulcer and inflammation of right lower extremity: Secondary | ICD-10-CM | POA: Diagnosis not present

## 2019-10-08 NOTE — Progress Notes (Signed)
MAIRA, CHRISTON ([id number]) Visit Report for 10/06/2019 Debridement Details Patient Name: Date of Service: Lindaann Slough [name] 10/06/2019 2:30 PM Medical Record Number: [id number] Patient Account Number: [id number] Date of Birth/Sex: Treating RN: 1930-09-15 (86 y.o. [name] Primary Care Provider: Henderson Cloud NE, A NGELA Other Clinician: Referring Provider: Treating Provider/Extender: Maudie Flakes NE, A NGELA Weeks in Treatment: 25 Debridement Performed for Assessment: Wound #6 Right Lower Leg Performed By: Physician Ricard Dillon., MD Debridement Type: Debridement Level of Consciousness (Pre-procedure): Awake and Alert Pre-procedure Verification/Time Out Yes - 15:52 Taken: Start Time: 15:52 Pain Control: Other : Benzocaine 20% T Area Debrided (L x W): otal 5 (cm) x 4 (cm) = 20 (cm) Tissue and other material debrided: Viable, Non-Viable, Slough, Subcutaneous, Slough Level: Skin/Subcutaneous Tissue Debridement Description: Excisional Instrument: Curette Bleeding: Minimum Hemostasis Achieved: Pressure End Time: 15:54 Procedural Pain: 4 Post Procedural Pain: 2 Response to Treatment: Procedure was tolerated well Level of Consciousness (Post- Awake and Alert procedure): Post Debridement Measurements of Total Wound Length: (cm) 10.8 Width: (cm) 12.7 Depth: (cm) 0.2 Volume: (cm) 21.545 Character of Wound/Ulcer Post Debridement: Requires Further Debridement Post Procedure Diagnosis Same as Pre-procedure Electronic Signature(s) Signed: 10/06/2019 6:00:25 PM By: Linton Ham MD Signed: 10/08/2019 6:13:34 PM By: [name] RN, BSN Entered By: Linton Ham on 10/06/2019 16:31:26 -------------------------------------------------------------------------------- HPI Details Patient Name: Date of Service: Lindaann Slough [name] 10/06/2019 2:30 PM Medical Record Number: [id number] Patient Account Number: [id number] Date of Birth/Sex: Treating  RN: 08-13-1930 (86 y.o. [name] Primary Care Provider: Henderson Cloud NE, A NGELA Other Clinician: Referring Provider: Treating Provider/Extender: Maudie Flakes NE, A NGELA Weeks in Treatment: 25 History of Present Illness HPI Description: We to find a new wound on the left 03/22/17; this is an 86 year old woman. There is not a lot of information in care everywhere on this patient. She had a history of a malignant neoplasm her left breast for which she had lumpectomy but she did not have radiation. Apparently she has had long- standing edema and her lower legs and she was seen in the wound care center at [facility] in [place] by [name] October. Both her legs were wrapped however it sounds as though the left leg became secondarily infected she apparently had MRSA and she was admitted the hospital. Not really turn for wound care. She lives at home on her own and has apparently [facility] home health care. We are not exactly clear what [facility] is doing to her legs but it does not involve compression. She is not a diabetic. ABIs in our clinic were noncompressible bilaterally 03/30/17; this is a patient who has bilateral chronic venous insufficiency, severe venous inflammation. We had a nice description number legs from a note by [name] dating 2010 describing edematous warm legs with erythema. This suggests that she has chronic venous insufficiency with inflammation.. We have arterial studies dated 12/29/16 again from [facility]. This showed a right ABI at 1 and a TBI of 0.81 on the left the ABI was 1.25 and a TBI of 0.72. This was just she does not have significant arterial insufficiency. She had normal triphasic waveforms noted that she was admitted to hospital in late October with MSSA cellulitis Last visit we wrapped both her legs. She had an open area predominantly on the left anterior but weeping edema on the right anterior leg. She has done well there is only one at  anterior left tibial wound  04/06/17; the patient has no open area on her right leg. Her remaining wound is on the left anterior leg, the left posterior leg wound is healed. We have been using silver alginate changed to [facility] on the left leg today she has home health 04/13/17; still no open area on her right leg. Her remaining wound on the left anterior leg. We have been using using Hydrofera Blue 04/20/17; no open area on the right leg although the edema here is somewhat disfiguring. Her remaining wound is on the left anterior leg we've been using Hydrofera Blue with improvement in dimensions. She has [facility] home health and lives in [place]. She requests to come every 2 weeks because of the distance involved in coming here 05/04/17; there is no open area on the right leg. She has a very small left anterior leg wound that remains. However what remains on the left still has some depth and a not to viable looking surface. It is too small to really attempt debridement. She has home health coming out to her home in [facility] 05/18/17; there is no open area on either leg. She has significant chronic venous insufficiency. The left anterior leg wound that was still open 2 weeks ago has closed. She has home health coming out. We are going to order Juxtalite stockings for both legs. She is agreed to pay for these privately. We will order them from [facility] READMISSION 04/14/2019 This is a patient we previously had in this clinic in 2019 for 3 months. She had a wound on the left anterior greater than right anterior leg. According to my notes we discharged her and juxta lite stockings although it does not appear that she ever got them. The history here is a bit difficult. She apparently has had a wound on the left lower leg for the last 6 months. She required admission to hospital in [facility] for apparent cellulitis just before Christmas was discharged home she has an open wound. She has a home health  agency although we are not exactly sure which one. They are applying silver alginate. No compression Past medical history is reviewed she has chronic venous insufficiency and a history of left breast CA. 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 She did not have any compression on the wounds today. 2/8; substantial wound on the right posterior calf and a smaller area on the left medial calf. We are using Iodoflex to both wound areas under compression. We have better edema control We to find a new wound on the left anterior medial calf 2/15; the area on the left medial calf is closed over. Substantial area on the right posterior and lateral calf. We have been using Iodoflex although the patient complains of pain and wonders if there is not an alternative. She has home health changing the dressing twice a week and she sees [place] once 2/22; the wound on the left medial calf is closed over. Her edema control is not good here but she cannot get on stockings. This substantial wound is on the right lateral and right posterior calf. Changed her to a hydrofera Blue last week. Gradual improvement in wound area. 3/8; wound on the left leg remains closed. The substantial wound is on the right lateral and right posterior calf. This is measuring smaller 3/22; 2-week follow-up. Substantial area on the right  posterior and lateral calf measures slightly smaller. We are using Hydrofera Blue under compression 4/5; 2-week follow-up. Substantial area on the right posterior lateral calf. This measures slightly smaller. She has islands of epithelialization we have been using Hydrofera Blue under compression 4/19; 2-week follow-up. We have been using Hydrofera Blue however there is maceration around the wound. I will change to silver alginate today. She has islands of epithelialization however I  did not see much evidence that things were improving here. 5/6; 2-week follow-up. I put silver alginate on this because of maceration around the wound last time. However she comes in today with a lot of very adherent debris on the wound of the right lateral leg. 5/17; 2-week follow-up. Using Hydrofera Blue under compression. The patient has epithelialization including islands of epithelialization in the middle of fairly substantial wound area on the right posterior lateral calf 6/1; 2-week follow-up. Using Hydrofera Blue under compression. She seems to have increasing epithelialization. This is an irregular wound going from medial to lateral posteriorly in the right posterior calf. This would not be easy wound to get reproducible measurement 08/25/2019 upon evaluation today patient appears to actually be doing better in regard to the wound on her leg at this point. We have been using Adaptic followed by [facility] this is keeping her from getting stuck. The patient is aware that this is also potentially slowing down her healing simply due to the fact that the [facility] not in direct contact with the wound bed. Nonetheless she was not happy with the way it was sticking previous which is why we switched to this according to what I can read in the note and hear from the patient and her aide with her today. 6/28; 2-week follow-up. We are following this patient for severe wound on the right lower leg secondary to chronic venous insufficiency using Hydrofera Blue under compression we are making gradual improvements. She arrives in clinic today with what looks like a contusion on the left anterior mid tibia. She says she was washing her leg and pulled some skin. However there is a fairly large wound with surrounding erythema and some swelling 7/12; 2-week follow-up. Right lower leg wound not quite as good as I thought. There is still islands of epithelialization we are using Hydrofera Blue with a  contact layer I would like to see if we can get rid of the contact layer at this point. The new area from last time is a lot smaller She did not take the cephalexin I initially prescribed for the erythema edema around the left leg due to fears about diarrhea. She did tolerate doxycycline that we subsequently called in 7/26; we are doing 2-week follow-up. Wound measurements are about the same. Still the same fibrinous debris we have been using Hydrofera Blue. Change to Iodoflex today. Electronic Signature(s) Signed: 10/06/2019 6:00:25 PM By: Linton Ham MD Entered By: Linton Ham on 10/06/2019 16:31:56 -------------------------------------------------------------------------------- Physical Exam Details Patient Name: Date of Service: Lindaann Slough [name] 10/06/2019 2:30 PM Medical Record Number: [id number] Patient Account Number: [id number] Date of Birth/Sex: Treating RN: [birth year] (86 y.o. [name] Primary Care Provider: Henderson Cloud NE, A NGELA Other Clinician: Referring Provider: Treating Provider/Extender: Maudie Flakes NE, A NGELA Weeks in Treatment: 25 Constitutional Patient is hypertensive.. Pulse regular and within target range for patient.Marland Kitchen Respirations regular, non-labored and within target range.. Temperature is normal and within the target range for the patient.Marland Kitchen Appears in no distress. Notes Wound exam;  no major change. She initially had good response with epithelialization however most recently the wound appears to have stalled. Still very fibrinous debris over the surface I attempted to remove this with an open curette she does not tolerate this that well. There is no evidence of surrounding infection Electronic Signature(s) Signed: 10/06/2019 6:00:25 PM By: Linton Ham MD Entered By: Linton Ham on 10/06/2019 16:33:13 -------------------------------------------------------------------------------- Physician Orders Details Patient Name: Date of  Service: Lindaann Slough [name] 10/06/2019 2:30 PM Medical Record Number: [id number] Patient Account Number: [id number] Date of Birth/Sex: Treating RN: 1930-12-01 (86 y.o. [name] Primary Care Provider: Henderson Cloud NE, A NGELA Other Clinician: Referring Provider: Treating Provider/Extender: Maudie Flakes NE, A NGELA Weeks in Treatment: 25 Verbal / Phone Orders: No Diagnosis Coding ICD-10 Coding Code Description I87.331 Chronic venous hypertension (idiopathic) with ulcer and inflammation of right lower extremity L97.812 Non-pressure chronic ulcer of other part of right lower leg with fat layer exposed L97.822 Non-pressure chronic ulcer of other part of left lower leg with fat layer exposed L03.116 Cellulitis of left lower limb I89.0 Lymphedema, not elsewhere classified Follow-up Appointments Return Appointment in 2 weeks. Dressing Change Frequency Wound #6 Right Lower Leg Change dressing three times week. - by home health. Home health to change 2x a week on week that patient has appt at wound clinic. Wound #8 Left,Anterior Lower Leg Change dressing three times week. - by home health. Home health to change 2x a week on week that patient has appt at wound clinic. Skin Barriers/Peri-Wound Care Barrier cream - to excoriated areas Moisturizing lotion - both legs Wound Cleansing May shower with protection. - use cast protector Primary Wound Dressing Wound #6 Right Lower Leg Iodoflex - or Iodosorb gel Wound #8 Left,Anterior Lower Leg Iodoflex - or Iodosorb gel Secondary Dressing Wound #6 Right Lower Leg ABD pad Zetuvit or Kerramax - or Xtrasorb (or other superabsorbent dressing) Wound #8 Left,Anterior Lower Leg Dry Gauze Edema Control 3 Layer Compression System - Bilateral Avoid standing for long periods of time Elevate legs to the level of the heart or above for 30 minutes daily and/or when sitting, a frequency of: - throughout the day [facility]. - [facility] Electronic Signature(s) Signed: 10/06/2019 6:00:25 PM By: Linton Ham MD Signed: 10/08/2019 6:13:34 PM By: [name] RN, BSN Entered By: [name] on 10/06/2019 15:56:26 -------------------------------------------------------------------------------- Problem List Details Patient Name: Date of Service: Lindaann Slough [name] 10/06/2019 2:30 PM Medical Record Number: [id number] Patient Account Number: [id number] Date of Birth/Sex: Treating RN: Oct 29, 1930 (86 y.o. [name] Primary Care Provider: Henderson Cloud NE, A NGELA Other Clinician: Referring Provider: Treating Provider/Extender: Maudie Flakes NE, A NGELA Weeks in Treatment: 25 Active Problems ICD-10 Encounter Code Description Active Date MDM Diagnosis I87.331 Chronic venous hypertension (idiopathic) with ulcer and inflammation of right 04/14/2019 No Yes lower extremity L97.812 Non-pressure chronic ulcer of other part of right lower leg with fat layer 04/14/2019 No Yes exposed L97.822 Non-pressure chronic ulcer of other part of left lower leg with fat layer exposed6/28/2021 No Yes L03.116 Cellulitis of left lower limb 09/08/2019 No Yes I89.0 Lymphedema, not elsewhere classified 04/14/2019 No Yes Inactive Problems ICD-10 Code Description Active Date Inactive Date L97.821 Non-pressure chronic ulcer of other part of left lower leg limited to breakdown of skin 04/21/2019 04/21/2019 Resolved Problems Electronic Signature(s) Signed: 10/06/2019 6:00:25 PM By: Linton Ham MD Entered By: Linton Ham on 10/06/2019  16:31:01 -------------------------------------------------------------------------------- Progress Note Details Patient Name: Date of Service: Lindaann Slough [name] 10/06/2019 2:30 PM Medical Record Number: [id number] Patient Account Number: [id number] Date of Birth/Sex: Treating RN: 1931/01/18 (86 y.o. [name] Primary Care Provider: Henderson Cloud NE, A NGELA  Other Clinician: Referring Provider: Treating Provider/Extender: Maudie Flakes NE, A NGELA Weeks in Treatment: 25 Subjective History of Present Illness (HPI) We to find a new wound on the left 03/22/17; this is an 86 year old woman. There is not a lot of information in care everywhere on this patient. She had a history of a malignant neoplasm her left breast for which she had lumpectomy but she did not have radiation. Apparently she has had long-standing edema and her lower legs and she was seen in the wound care center at [facility] in [place] by [name] October. Both her legs were wrapped however it sounds as though the left leg became secondarily infected she apparently had MRSA and she was admitted the hospital. Not really turn for wound care. She lives at home on her own and has apparently [facility] home health care. We are not exactly clear what [facility] is doing to her legs but it does not involve compression. She is not a diabetic. ABIs in our clinic were noncompressible bilaterally 03/30/17; this is a patient who has bilateral chronic venous insufficiency, severe venous inflammation. We had a nice description number legs from a note by [name] dating 2010 describing edematous warm legs with erythema. This suggests that she has chronic venous insufficiency with inflammation.. We have arterial studies dated 12/29/16 again from [facility]. This showed a right ABI at 1 and a TBI of 0.81 on the left the ABI was 1.25 and a TBI of 0.72. This was just she does not have significant arterial insufficiency. She had normal triphasic waveforms noted that she was admitted to hospital in late October with MSSA cellulitis Last visit we wrapped both her legs. She had an open area predominantly on the left anterior but weeping edema on the right anterior leg. She has done well there is only one at anterior left tibial wound 04/06/17; the patient has no open area on her right leg.  Her remaining wound is on the left anterior leg, the left posterior leg wound is healed. We have been using silver alginate changed to [facility] on the left leg today she has home health 04/13/17; still no open area on her right leg. Her remaining wound on the left anterior leg. We have been using using Hydrofera Blue 04/20/17; no open area on the right leg although the edema here is somewhat disfiguring. Her remaining wound is on the left anterior leg we've been using Hydrofera Blue with improvement in dimensions. She has [facility] home health and lives in [place]. She requests to come every 2 weeks because of the distance involved in coming here 05/04/17; there is no open area on the right leg. She has a very small left anterior leg wound that remains. However what remains on the left still has some depth and a not to viable looking surface. It is too small to really attempt debridement. She has home health coming out to her home in [facility] 05/18/17; there is no open area on either leg. She has significant chronic venous insufficiency. The left anterior leg wound that was still open 2 weeks ago has closed. She has home health coming out. We are going to order Juxtalite stockings for both  legs. She is agreed to pay for these privately. We will order them from [facility] READMISSION 04/14/2019 This is a patient we previously had in this clinic in 2019 for 3 months. She had a wound on the left anterior greater than right anterior leg. According to my notes we discharged her and juxta lite stockings although it does not appear that she ever got them. The history here is a bit difficult. She apparently has had a wound on the left lower leg for the last 6 months. She required admission to hospital in [facility] for apparent cellulitis just before Christmas was discharged home she has an open wound. She has a home health agency although we are not exactly sure which one. They are applying silver alginate.  No compression Past medical history is reviewed she has chronic venous insufficiency and a history of left breast CA. 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 She did not have any compression on the wounds today. 2/8; substantial wound on the right posterior calf and a smaller area on the left medial calf. We are using Iodoflex to both wound areas under compression. We have better edema control We to find a new wound on the left anterior medial calf 2/15; the area on the left medial calf is closed over. Substantial area on the right posterior and lateral calf. We have been using Iodoflex although the patient complains of pain and wonders if there is not an alternative. She has home health changing the dressing twice a week and she sees [place] once 2/22; the wound on the left medial calf is closed over. Her edema control is not good here but she cannot get on stockings. This substantial wound is on the right lateral and right posterior calf. Changed her to a hydrofera Blue last week. Gradual improvement in wound area. 3/8; wound on the left leg remains closed. The substantial wound is on the right lateral and right posterior calf. This is measuring smaller 3/22; 2-week follow-up. Substantial area on the right posterior and lateral calf measures slightly smaller. We are using Hydrofera Blue under compression 4/5; 2-week follow-up. Substantial area on the right posterior lateral calf. This measures slightly smaller. She has islands of epithelialization we have been using Hydrofera Blue under compression 4/19; 2-week follow-up. We have been using Hydrofera Blue however there is maceration around the wound. I will change to silver alginate today. She has islands of epithelialization however I did not see much evidence that things were improving here. 5/6; 2-week follow-up. I put  silver alginate on this because of maceration around the wound last time. However she comes in today with a lot of very adherent debris on the wound of the right lateral leg. 5/17; 2-week follow-up. Using Hydrofera Blue under compression. The patient has epithelialization including islands of epithelialization in the middle of fairly substantial wound area on the right posterior lateral calf 6/1; 2-week follow-up. Using Hydrofera Blue under compression. She seems to have increasing epithelialization. This is an irregular wound going from medial to lateral posteriorly in the right posterior calf. This would not be easy wound to get reproducible measurement 08/25/2019 upon evaluation today patient appears to actually be doing better in regard to the wound on her leg at this point. We have been using Adaptic followed by [facility] this is keeping her from getting stuck. The patient  is aware that this is also potentially slowing down her healing simply due to the fact that the [facility] not in direct contact with the wound bed. Nonetheless she was not happy with the way it was sticking previous which is why we switched to this according to what I can read in the note and hear from the patient and her aide with her today. 6/28; 2-week follow-up. We are following this patient for severe wound on the right lower leg secondary to chronic venous insufficiency using Hydrofera Blue under compression we are making gradual improvements. She arrives in clinic today with what looks like a contusion on the left anterior mid tibia. She says she was washing her leg and pulled some skin. However there is a fairly large wound with surrounding erythema and some swelling 7/12; 2-week follow-up. Right lower leg wound not quite as good as I thought. There is still islands of epithelialization we are using Hydrofera Blue with a contact layer I would like to see if we can get rid of the contact layer at this  point. ooThe new area from last time is a lot smaller ooShe did not take the cephalexin I initially prescribed for the erythema edema around the left leg due to fears about diarrhea. She did tolerate doxycycline that we subsequently called in 7/26; we are doing 2-week follow-up. Wound measurements are about the same. Still the same fibrinous debris we have been using Hydrofera Blue. Change to Iodoflex today. Objective Constitutional Patient is hypertensive.. Pulse regular and within target range for patient.Marland Kitchen Respirations regular, non-labored and within target range.. Temperature is normal and within the target range for the patient.Marland Kitchen Appears in no distress. Vitals Time Taken: 3:00 PM, Height: 60 in, Weight: 130 lbs, BMI: 25.4, Temperature: 97.9 F, Pulse: 68 bpm, Respiratory Rate: 18 breaths/min, Blood Pressure: 187/71 mmHg. General Notes: Wound exam; no major change. She initially had good response with epithelialization however most recently the wound appears to have stalled. Still very fibrinous debris over the surface I attempted to remove this with an open curette she does not tolerate this that well. There is no evidence of surrounding infection Integumentary (Hair, Skin) Wound #6 status is Open. Original cause of wound was Gradually Appeared. The wound is located on the Right Lower Leg. The wound measures 10.8cm length x 12.7cm width x 0.2cm depth; 107.725cm^2 area and 21.545cm^3 volume. There is Fat Layer (Subcutaneous Tissue) Exposed exposed. There is no tunneling or undermining noted. There is a large amount of serosanguineous drainage noted. The wound margin is distinct with the outline attached to the wound base. There is medium (34-66%) red, pink granulation within the wound bed. There is a medium (34-66%) amount of necrotic tissue within the wound bed including Adherent Slough. Wound #8 status is Open. Original cause of wound was Trauma. The wound is located on the  Left,Anterior Lower Leg. The wound measures 0.6cm length x 1cm width x 0.1cm depth; 0.471cm^2 area and 0.047cm^3 volume. There is Fat Layer (Subcutaneous Tissue) Exposed exposed. There is no tunneling or undermining noted. There is a medium amount of serosanguineous drainage noted. The wound margin is distinct with the outline attached to the wound base. There is large (67-100%) red granulation within the wound bed. There is a small (1-33%) amount of necrotic tissue within the wound bed including Adherent Slough. Assessment Active Problems ICD-10 Chronic venous hypertension (idiopathic) with ulcer and inflammation of right lower extremity Non-pressure chronic ulcer of other part of right lower leg with  fat layer exposed Non-pressure chronic ulcer of other part of left lower leg with fat layer exposed Cellulitis of left lower limb Lymphedema, not elsewhere classified Procedures Wound #6 Pre-procedure diagnosis of Wound #6 is a Lymphedema located on the Right Lower Leg . There was a Excisional Skin/Subcutaneous Tissue Debridement with a total area of 20 sq cm performed by Ricard Dillon., MD. With the following instrument(s): Curette to remove Viable and Non-Viable tissue/material. Material removed includes Subcutaneous Tissue and Slough and after achieving pain control using Other (Benzocaine 20%). No specimens were taken. A time out was conducted at 15:52, prior to the start of the procedure. A Minimum amount of bleeding was controlled with Pressure. The procedure was tolerated well with a pain level of 4 throughout and a pain level of 2 following the procedure. Post Debridement Measurements: 10.8cm length x 12.7cm width x 0.2cm depth; 21.545cm^3 volume. Character of Wound/Ulcer Post Debridement requires further debridement. Post procedure Diagnosis Wound #6: Same as Pre-Procedure Pre-procedure diagnosis of Wound #6 is a Lymphedema located on the Right Lower Leg . There was a Three Layer  Compression Therapy Procedure by [name], RN. Post procedure Diagnosis Wound #6: Same as Pre-Procedure Wound #8 Pre-procedure diagnosis of Wound #8 is a Trauma, Other located on the Left,Anterior Lower Leg . There was a Three Layer Compression Therapy Procedure by [name], RN. Post procedure Diagnosis Wound #8: Same as Pre-Procedure Plan Follow-up Appointments: Return Appointment in 2 weeks. Dressing Change Frequency: Wound #6 Right Lower Leg: Change dressing three times week. - by home health. Home health to change 2x a week on week that patient has appt at wound clinic. Wound #8 Left,Anterior Lower Leg: Change dressing three times week. - by home health. Home health to change 2x a week on week that patient has appt at wound clinic. Skin Barriers/Peri-Wound Care: Barrier cream - to excoriated areas Moisturizing lotion - both legs Wound Cleansing: May shower with protection. - use cast protector Primary Wound Dressing: Wound #6 Right Lower Leg: Iodoflex - or Iodosorb gel Wound #8 Left,Anterior Lower Leg: Iodoflex - or Iodosorb gel Secondary Dressing: Wound #6 Right Lower Leg: ABD pad Zetuvit or Kerramax - or Xtrasorb (or other superabsorbent dressing) Wound #8 Left,Anterior Lower Leg: Dry Gauze Edema Control: 3 Layer Compression System - Bilateral Avoid standing for long periods of time Elevate legs to the level of the heart or above for 30 minutes daily and/or when sitting, a frequency of: - throughout the day Home Health: [facility] for [facility]. - [facility] 1. I change the primary dressing to Iodoflex still under compression. The purpose of this is to see if we can aid with debridement. 2. Continue under 3 layer compression Electronic Signature(s) Signed: 10/06/2019 6:00:25 PM By: Linton Ham MD Entered By: Linton Ham on 10/06/2019  16:34:04 -------------------------------------------------------------------------------- SuperBill Details Patient Name: Date of Service: [name], CHA [name] 10/06/2019 Medical Record Number: [id number] Patient Account Number: [id number] Date of Birth/Sex: Treating RN: Nov 25, 1930 (86 y.o. [name] Primary Care Provider: Henderson Cloud NE, A NGELA Other Clinician: Referring Provider: Treating Provider/Extender: Maudie Flakes NE, A NGELA Weeks in Treatment: 25 Diagnosis Coding ICD-10 Codes Code Description I87.331 Chronic venous hypertension (idiopathic) with ulcer and inflammation of right lower extremity L97.812 Non-pressure chronic ulcer of other part of right lower leg with fat layer exposed L97.822 Non-pressure chronic ulcer of other part of left lower leg with fat layer exposed L03.116 Cellulitis of left lower limb  I89.0 Lymphedema, not elsewhere classified Facility Procedures CPT4 Code: 52589483 Description: 47583 - DEB SUBQ TISSUE 20 SQ CM/< ICD-10 Diagnosis Description E74.600 Non-pressure chronic ulcer of other part of right lower leg with fat layer exposed Modifier: Quantity: 1 CPT4 Code: 29847308 Description: (Facility Use Only) 29581LT - [facility] LWR LT LEG Modifier: 59 Quantity: 1 Physician Procedures : CPT4 Code Description Modifier [id number] 11042 - WC PHYS SUBQ TISS 20 SQ CM ICD-10 Diagnosis Description G28.902 Non-pressure chronic ulcer of other part of right lower leg with fat layer exposed Quantity: 1 Electronic Signature(s) Signed: 10/08/2019 3:52:22 AM By: Linton Ham MD Signed: 10/08/2019 6:13:34 PM By: [name] RN, BSN Previous Signature: 10/06/2019 6:00:25 PM Version By: Linton Ham MD Entered By: [name] on 10/06/2019 18:04:21

## 2019-10-08 NOTE — Progress Notes (Signed)
DESTYN, PARFITT ([id number]) Visit Report for 10/06/2019 Arrival Information Details Patient Name: Date of Service: Erin [name] [name] 10/06/2019 2:30 PM Medical Record Number: [id number] Patient Account Number: [id number] Date of Birth/Sex: Treating RN: Jun 25, 1930 (86 y.o. [name] Primary Care Justyn Langham: Henderson Cloud NE, A NGELA Other Clinician: Referring Iann Rodier: Treating Donyale Falcon/Extender: Maudie Flakes NE, A NGELA Weeks in Treatment: 25 Visit Information History Since Last Visit Added or deleted any medications: No Patient Arrived: Wheel Chair Any new allergies or adverse reactions: No Arrival Time: 15:06 Had a fall or experienced change in No Accompanied By: caregiver activities of daily living that may affect Transfer Assistance: EasyPivot Patient Lift risk of falls: Patient Identification Verified: Yes Signs or symptoms of abuse/neglect since last visito No Secondary Verification Process Completed: Yes Hospitalized since last visit: No Patient Requires Transmission-Based Precautions: No Implantable device outside of the clinic excluding No Patient Has Alerts: Yes cellular tissue based products placed in the center Patient Alerts: NON COMPRESSABLE since last visit: Has Dressing in Place as Prescribed: Yes Has Compression in Place as Prescribed: Yes Pain Present Now: No Electronic Signature(s) Signed: 10/06/2019 6:11:36 PM By: Kela Millin Entered By: Kela Millin on 10/06/2019 15:06:36 -------------------------------------------------------------------------------- Compression Therapy Details Patient Name: Date of Service: Erin [name] [name] 10/06/2019 2:30 PM Medical Record Number: [id number] Patient Account Number: [id number] Date of Birth/Sex: Treating RN: 05-04-1930 (86 y.o. [name] Primary Care Sydne Krahl: Henderson Cloud NE, A NGELA Other Clinician: Referring Laurence Crofford: Treating Jaedyn Lard/Extender: Maudie Flakes NE, A  NGELA Weeks in Treatment: 25 Compression Therapy Performed for Wound Assessment: Wound #6 Right Lower Leg Performed By: Clinician [name], RN Compression Type: Three Layer Post Procedure Diagnosis Same as Pre-procedure Electronic Signature(s) Signed: 10/08/2019 6:13:34 PM By: [name] RN, BSN Entered By: [name] on 10/06/2019 15:55:45 -------------------------------------------------------------------------------- Compression Therapy Details Patient Name: Date of Service: Erin [name] [name] 10/06/2019 2:30 PM Medical Record Number: [id number] Patient Account Number: [id number] Date of Birth/Sex: Treating RN: 04-Aug-1930 (86 y.o. [name] Primary Care Silvina Hackleman: Henderson Cloud NE, A NGELA Other Clinician: Referring Clemma Johnsen: Treating Bawi Lakins/Extender: Maudie Flakes NE, A NGELA Weeks in Treatment: 25 Compression Therapy Performed for Wound Assessment: Wound #8 Left,Anterior Lower Leg Performed By: Clinician [name], RN Compression Type: Three Layer Post Procedure Diagnosis Same as Pre-procedure Electronic Signature(s) Signed: 10/08/2019 6:13:34 PM By: [name] RN, BSN Entered By: [name] on 10/06/2019 15:55:45 -------------------------------------------------------------------------------- Encounter Discharge Information Details Patient Name: Date of Service: Erin [name] [name] 10/06/2019 2:30 PM Medical Record Number: [id number] Patient Account Number: [id number] Date of Birth/Sex: Treating RN: 06/04/1930 (86 y.o. [name] Primary Care Leighanna Kirn: Tedra Coupe, A NGELA Other Clinician: Referring Chico Cawood: Treating Franchon Ketterman/Extender: Maudie Flakes NE, A NGELA Weeks in Treatment: 25 Encounter Discharge Information Items Post Procedure Vitals Discharge Condition: Stable Temperature (F): 97.9 Ambulatory Status: Wheelchair Pulse (bpm): 68 Discharge Destination: Home Respiratory Rate (breaths/min):  18 Transportation: Private Auto Blood Pressure (mmHg): 187/71 Accompanied By: [name] Schedule Follow-up Appointment: Yes Clinical Summary of Care: Patient Declined Electronic Signature(s) Signed: 10/06/2019 6:12:56 PM By: [name] RN, BSN Entered By: [name] on 10/06/2019 18:02:09 -------------------------------------------------------------------------------- Lower Extremity Assessment Details Patient Name: Date of Service: Erin [name] [name] 10/06/2019 2:30 PM Medical Record Number: [id number] Patient Account Number: [id number] Date of Birth/Sex: Treating RN: 07/25/1930 (86 y.o. [name] Primary Care Arvle Grabe: BO O NE, A NGELA Other  Clinician: Referring Sherronda Sweigert: Treating Judith Campillo/Extender: Maudie Flakes NE, A NGELA Weeks in Treatment: 25 Edema Assessment Assessed: [Left: No] [Right: No] Edema: [Left: Ye] [Right: s] Calf Left: Right: Point of Measurement: 35 cm From Medial Instep 28 cm 27.5 cm Ankle Left: Right: Point of Measurement: 7 cm From Medial Instep 22.5 cm 24 cm Vascular Assessment Pulses: Dorsalis Pedis Palpable: [Left:Yes] [Right:Yes] Electronic Signature(s) Signed: 10/06/2019 6:11:36 PM By: Kela Millin Entered By: Kela Millin on 10/06/2019 15:11:08 -------------------------------------------------------------------------------- Multi Wound Chart Details Patient Name: Date of Service: Erin [name] [name] 10/06/2019 2:30 PM Medical Record Number: [id number] Patient Account Number: [id number] Date of Birth/Sex: Treating RN: 1931/02/04 (86 y.o. [name] Primary Care Keandre Linden: Henderson Cloud NE, A NGELA Other Clinician: Referring Jade Burkard: Treating Arriel Victor/Extender: Maudie Flakes NE, A NGELA Weeks in Treatment: 25 Vital Signs Height(in): 60 Pulse(bpm): 68 Weight(lbs): 130 Blood Pressure(mmHg): 187/71 Body Mass Index(BMI): 25 Temperature(F): 97.9 Respiratory Rate(breaths/min): 18 Photos:  [6:No Photos Right Lower Leg] [8:No Photos Left, Anterior Lower Leg] [N/A:N/A N/A] Wound Location: [6:Gradually Appeared] [8:Trauma] [N/A:N/A] Wounding Event: [6:Lymphedema] [8:Trauma, Other] [N/A:N/A] Primary Etiology: [6:N/A] [8:Lymphedema] [N/A:N/A] Secondary Etiology: [6:Cataracts, Glaucoma, Middle ear] [8:Cataracts, Glaucoma, Middle ear] [N/A:N/A] Comorbid History: [6:problems, Anemia, Hypertension 03/14/2019] [8:problems, Anemia, Hypertension 09/04/2019] [N/A:N/A] Date Acquired: [6:25] [8:4] [N/A:N/A] Weeks of Treatment: [6:Open] [8:Open] [N/A:N/A] Wound Status: [6:10.8x12.7x0.2] [8:0.6x1x0.1] [N/A:N/A] Measurements L x W x D (cm) [6:107.725] [8:0.471] [N/A:N/A] A (cm) : rea [6:21.545] [8:0.047] [N/A:N/A] Volume (cm) : [6:19.80%] [8:93.70%] [N/A:N/A] % Reduction in Area: [6:59.90%] [8:93.70%] [N/A:N/A] % Reduction in Volume: [6:Full Thickness Without Exposed] [8:Full Thickness Without Exposed] [N/A:N/A] Classification: [6:Support Structures Large] [8:Support Structures Medium] [N/A:N/A] Exudate Amount: [6:Serosanguineous] [8:Serosanguineous] [N/A:N/A] Exudate Type: [6:red, brown] [8:red, brown] [N/A:N/A] Exudate Color: [6:Distinct, outline attached] [8:Distinct, outline attached] [N/A:N/A] Wound Margin: [6:Medium (34-66%)] [8:Large (67-100%)] [N/A:N/A] Granulation Amount: [6:Red, Pink] [8:Red] [N/A:N/A] Granulation Quality: [6:Medium (34-66%)] [8:Small (1-33%)] [N/A:N/A] Necrotic Amount: [6:Fat Layer (Subcutaneous Tissue)] [8:Fat Layer (Subcutaneous Tissue)] [N/A:N/A] Exposed Structures: [6:Exposed: Yes Fascia: No Tendon: No Muscle: No Joint: No Bone: No Medium (34-66%)] [8:Exposed: Yes Fascia: No Tendon: No Muscle: No Joint: No Bone: No None] [N/A:N/A] Epithelialization: [6:Debridement - Excisional] [8:N/A] [N/A:N/A] Debridement: Pre-procedure Verification/Time Out 15:52 [8:N/A] [N/A:N/A] Taken: [6:Other] [8:N/A] [N/A:N/A] Pain Control: [6:Subcutaneous, [name]] [8:N/A]  [N/A:N/A] Tissue Debrided: [6:Skin/Subcutaneous Tissue] [8:N/A] [N/A:N/A] Level: [6:20] [8:N/A] [N/A:N/A] Debridement A (sq cm): [6:rea Curette] [8:N/A] [N/A:N/A] Instrument: [6:Minimum] [8:N/A] [N/A:N/A] Bleeding: [6:Pressure] [8:N/A] [N/A:N/A] Hemostasis A chieved: [6:4] [8:N/A] [N/A:N/A] Procedural Pain: [6:2] [8:N/A] [N/A:N/A] Post Procedural Pain: [6:Procedure was tolerated well] [8:N/A] [N/A:N/A] Debridement Treatment Response: [6:10.8x12.7x0.2] [8:N/A] [N/A:N/A] Post Debridement Measurements L x W x D (cm) [6:21.545] [8:N/A] [N/A:N/A] Post Debridement Volume: (cm) [6:Compression Therapy] [8:Compression Therapy] [N/A:N/A] Procedures Performed: [6:Debridement] Treatment Notes Electronic Signature(s) Signed: 10/06/2019 6:00:25 PM By: Linton Ham MD Signed: 10/08/2019 6:13:34 PM By: [name] RN, BSN Entered By: Linton Ham on 10/06/2019 16:31:10 -------------------------------------------------------------------------------- Multi-Disciplinary Care Plan Details Patient Name: Date of Service: Erin [name] [name] 10/06/2019 2:30 PM Medical Record Number: [id number] Patient Account Number: [id number] Date of Birth/Sex: Treating RN: 16-Jun-1930 (86 y.o. [name] Primary Care Raul Winterhalter: Henderson Cloud NE, A NGELA Other Clinician: Referring Kendyl Bissonnette: Treating Anaria Kroner/Extender: Maudie Flakes NE, A NGELA Weeks in Treatment: 25 Active Inactive Wound/Skin Impairment Nursing Diagnoses: Impaired tissue integrity Knowledge deficit related to ulceration/compromised skin integrity Goals: Patient/caregiver will verbalize understanding of skin care regimen Date Initiated: 04/14/2019 Target Resolution Date: 11/07/2019 Goal Status: Active Ulcer/skin breakdown will have a volume  reduction of 30% by week 4 Date Initiated: 04/14/2019 Date Inactivated: 05/19/2019 Target Resolution Date: 05/16/2019 Goal Status: Met Ulcer/skin breakdown will have a volume reduction of 50% by  week 8 Date Initiated: 05/19/2019 Date Inactivated: 06/16/2019 Target Resolution Date: 06/20/2019 Goal Status: Met Interventions: Assess patient/caregiver ability to obtain necessary supplies Assess patient/caregiver ability to perform ulcer/skin care regimen upon admission and as needed Assess ulceration(s) every visit Provide education on ulcer and skin care Notes: Electronic Signature(s) Signed: 10/08/2019 6:13:34 PM By: [name] RN, BSN Entered By: [name] on 10/06/2019 15:53:24 -------------------------------------------------------------------------------- Pain Assessment Details Patient Name: Date of Service: Erin [name] [name] 10/06/2019 2:30 PM Medical Record Number: [id number] Patient Account Number: [id number] Date of Birth/Sex: Treating RN: [birth year] (86 y.o. [name] Primary Care Jennilee Demarco: Henderson Cloud NE, A NGELA Other Clinician: Referring Meko Bellanger: Treating Shante Maysonet/Extender: Maudie Flakes NE, A NGELA Weeks in Treatment: 25 Active Problems Location of Pain Severity and Description of Pain Patient Has Paino No Site Locations Pain Management and Medication Current Pain Management: Electronic Signature(s) Signed: 10/06/2019 6:11:36 PM By: Kela Millin Entered By: Kela Millin on 10/06/2019 15:07:02 -------------------------------------------------------------------------------- Patient/Caregiver Education Details Patient Name: Date of Service: Erin [name] [name] 7/26/2021andnbsp2:30 PM Medical Record Number: [id number] Patient Account Number: [id number] Date of Birth/Gender: Treating RN: 1931/01/12 (86 y.o. [name] Primary Care Physician: Henderson Cloud NE, A NGELA Other Clinician: Referring Physician: Treating Physician/Extender: Maudie Flakes NE, A NGELA Weeks in Treatment: 25 Education Assessment Education Provided To: Patient Education Topics Provided Wound/Skin Impairment: Methods:  Explain/Verbal Responses: State content correctly Electronic Signature(s) Signed: 10/08/2019 6:13:34 PM By: [name] RN, BSN Entered By: [name] on 10/06/2019 15:53:36 -------------------------------------------------------------------------------- Wound Assessment Details Patient Name: Date of Service: Erin [name] [name] 10/06/2019 2:30 PM Medical Record Number: [id number] Patient Account Number: [id number] Date of Birth/Sex: Treating RN: Aug 29, 1930 (86 y.o. [name] Primary Care Loralyn Rachel: Henderson Cloud NE, A NGELA Other Clinician: Referring Prem Coykendall: Treating Terre Zabriskie/Extender: Maudie Flakes NE, A NGELA Weeks in Treatment: 25 Wound Status Wound Number: 6 Primary Lymphedema Etiology: Wound Location: Right Lower Leg Wound Status: Open Wounding Event: Gradually Appeared Comorbid Cataracts, Glaucoma, Middle ear problems, Anemia, Date Acquired: 03/14/2019 History: Hypertension Weeks Of Treatment: 25 Clustered Wound: No Photos Photo Uploaded By: Mikeal Hawthorne on 10/08/2019 10:28:00 Wound Measurements Length: (cm) 10.8 Width: (cm) 12.7 Depth: (cm) 0.2 Area: (cm) 107.725 Volume: (cm) 21.545 % Reduction in Area: 19.8% % Reduction in Volume: 59.9% Epithelialization: Medium (34-66%) Tunneling: No Undermining: No Wound Description Classification: Full Thickness Without Exposed Support Structures Wound Margin: Distinct, outline attached Exudate Amount: Large Exudate Type: Serosanguineous Exudate Color: red, brown Foul Odor After Cleansing: No [name]/Fibrino Yes Wound Bed Granulation Amount: Medium (34-66%) Exposed Structure Granulation Quality: Red, Pink Fascia Exposed: No Necrotic Amount: Medium (34-66%) Fat Layer (Subcutaneous Tissue) Exposed: Yes Necrotic Quality: Adherent [name] Tendon Exposed: No Muscle Exposed: No Joint Exposed: No Bone Exposed: No Treatment Notes Wound #6 (Right Lower Leg) 2. Periwound Care Moisturizing  lotion 3. Primary Dressing Applied Iodoflex 4. Secondary Dressing ABD Pad Dry Gauze Other secondary dressing (specify in notes) 6. Support Layer Applied 3 layer compression wrap Notes zetuvit Electronic Signature(s) Signed: 10/06/2019 6:11:36 PM By: Kela Millin Entered By: Kela Millin on 10/06/2019 15:17:38 -------------------------------------------------------------------------------- Wound Assessment Details Patient Name: Date of Service: Erin [name] [name] 10/06/2019 2:30 PM Medical Record Number: [id number] Patient Account Number: [id number] Date of Birth/Sex: Treating RN: 1930/08/01 (86  y.o. [name] Primary Care Debbi Strandberg: Henderson Cloud NE, A NGELA Other Clinician: Referring Carson Bogden: Treating Keryn Nessler/Extender: Maudie Flakes NE, A NGELA Weeks in Treatment: 25 Wound Status Wound Number: 8 Primary Etiology: Trauma, Other Wound Location: Left, Anterior Lower Leg Secondary Lymphedema Etiology: Wounding Event: Trauma Wound Status: Open Date Acquired: 09/04/2019 Comorbid Cataracts, Glaucoma, Middle ear problems, Anemia, Weeks Of Treatment: 4 History: Hypertension Clustered Wound: No Photos Photo Uploaded By: Mikeal Hawthorne on 10/08/2019 10:28:01 Wound Measurements Length: (cm) 0.6 Width: (cm) 1 Depth: (cm) 0.1 Area: (cm) 0.471 Volume: (cm) 0.047 % Reduction in Area: 93.7% % Reduction in Volume: 93.7% Epithelialization: None Tunneling: No Undermining: No Wound Description Classification: Full Thickness Without Exposed Support Structures Wound Margin: Distinct, outline attached Exudate Amount: Medium Exudate Type: Serosanguineous Exudate Color: red, brown Foul Odor After Cleansing: No [name]/Fibrino Yes Wound Bed Granulation Amount: Large (67-100%) Exposed Structure Granulation Quality: Red Fascia Exposed: No Necrotic Amount: Small (1-33%) Fat Layer (Subcutaneous Tissue) Exposed: Yes Necrotic Quality: Adherent [name] Tendon  Exposed: No Muscle Exposed: No Joint Exposed: No Bone Exposed: No Treatment Notes Wound #8 (Left, Anterior Lower Leg) 2. Periwound Care Moisturizing lotion 3. Primary Dressing Applied Iodoflex 4. Secondary Dressing Dry Gauze 6. Support Layer Applied 3 layer compression wrap Electronic Signature(s) Signed: 10/06/2019 6:11:36 PM By: Kela Millin Entered By: Kela Millin on 10/06/2019 15:19:08 -------------------------------------------------------------------------------- Vitals Details Patient Name: Date of Service: Erin [name] [name] 10/06/2019 2:30 PM Medical Record Number: [id number] Patient Account Number: [id number] Date of Birth/Sex: Treating RN: 03/31/1930 (86 y.o. [name] Primary Care Shenekia Riess: Henderson Cloud NE, A NGELA Other Clinician: Referring Dandrea Medders: Treating Marlenne Ridge/Extender: Maudie Flakes NE, A NGELA Weeks in Treatment: 25 Vital Signs Time Taken: 15:00 Temperature (F): 97.9 Height (in): 60 Pulse (bpm): 68 Weight (lbs): 130 Respiratory Rate (breaths/min): 18 Body Mass Index (BMI): 25.4 Blood Pressure (mmHg): 187/71 Reference Range: 80 - 120 mg / dl Electronic Signature(s) Signed: 10/06/2019 6:11:36 PM By: Kela Millin Entered By: Kela Millin on 10/06/2019 15:06:56

## 2019-10-21 ENCOUNTER — Encounter (HOSPITAL_BASED_OUTPATIENT_CLINIC_OR_DEPARTMENT_OTHER): Payer: Medicare HMO | Admitting: Internal Medicine

## 2019-10-28 ENCOUNTER — Encounter (HOSPITAL_BASED_OUTPATIENT_CLINIC_OR_DEPARTMENT_OTHER): Payer: Medicare HMO | Attending: Internal Medicine | Admitting: Internal Medicine

## 2019-10-28 DIAGNOSIS — L03116 Cellulitis of left lower limb: Secondary | ICD-10-CM | POA: Insufficient documentation

## 2019-10-28 DIAGNOSIS — I87331 Chronic venous hypertension (idiopathic) with ulcer and inflammation of right lower extremity: Secondary | ICD-10-CM | POA: Insufficient documentation

## 2019-10-28 DIAGNOSIS — Z853 Personal history of malignant neoplasm of breast: Secondary | ICD-10-CM | POA: Insufficient documentation

## 2019-10-28 DIAGNOSIS — Z8614 Personal history of Methicillin resistant Staphylococcus aureus infection: Secondary | ICD-10-CM | POA: Diagnosis not present

## 2019-10-28 DIAGNOSIS — L97822 Non-pressure chronic ulcer of other part of left lower leg with fat layer exposed: Secondary | ICD-10-CM | POA: Insufficient documentation

## 2019-10-28 DIAGNOSIS — I890 Lymphedema, not elsewhere classified: Secondary | ICD-10-CM | POA: Diagnosis not present

## 2019-10-28 DIAGNOSIS — L97812 Non-pressure chronic ulcer of other part of right lower leg with fat layer exposed: Secondary | ICD-10-CM | POA: Diagnosis not present

## 2019-11-08 NOTE — Progress Notes (Signed)
ANDRIANA, CASA ([id number]) Visit Report for 10/28/2019 Arrival Information Details Patient Name: Date of Service: Lindaann Slough [name] 10/28/2019 2:30 PM Medical Record Number: [id number] Patient Account Number: [id number] Date of Birth/Sex: Treating RN: 1930/05/27 (86 y.o. [name] Primary Care Deem Marmol: Tedra Coupe, A NGELA Other Clinician: Referring Reyn Faivre: Treating Nevaya Nagele/Extender: Maudie Flakes NE, A NGELA Weeks in Treatment: 28 Visit Information History Since Last Visit Added or deleted any medications: No Patient Arrived: Wheel Chair Any new allergies or adverse reactions: No Arrival Time: 14:36 Had a fall or experienced change in No Accompanied By: dgt activities of daily living that may affect Transfer Assistance: None risk of falls: Patient Identification Verified: Yes Signs or symptoms of abuse/neglect since last visito No Secondary Verification Process Completed: Yes Hospitalized since last visit: No Patient Requires Transmission-Based Precautions: No Implantable device outside of the clinic excluding No Patient Has Alerts: Yes cellular tissue based products placed in the center Patient Alerts: NON COMPRESSABLE since last visit: Has Dressing in Place as Prescribed: Yes Has Compression in Place as Prescribed: Yes Pain Present Now: No Electronic Signature(s) Signed: 11/07/2019 5:50:16 PM By: [name] RN Entered By: [name] on 10/28/2019 16:49:24 -------------------------------------------------------------------------------- Encounter Discharge Information Details Patient Name: Date of Service: Lindaann Slough [name] 10/28/2019 2:30 PM Medical Record Number: [id number] Patient Account Number: [id number] Date of Birth/Sex: Treating RN: 1930-07-09 (86 y.o. [name] Primary Care Octavion Mollenkopf: Henderson Cloud NE, A NGELA Other Clinician: Referring Charlot Gouin: Treating Monie Shere/Extender: Maudie Flakes NE, A NGELA Weeks in  Treatment: 28 Encounter Discharge Information Items Post Procedure Vitals Discharge Condition: Stable Temperature (F): 98.4 Ambulatory Status: Wheelchair Pulse (bpm): 75 Discharge Destination: Home Respiratory Rate (breaths/min): 18 Transportation: Private Auto Blood Pressure (mmHg): 177/73 Accompanied By: caregiver Schedule Follow-up Appointment: Yes Clinical Summary of Care: Patient Declined Electronic Signature(s) Signed: 10/28/2019 5:06:17 PM By: Kela Millin Entered By: Kela Millin on 10/28/2019 16:51:02 -------------------------------------------------------------------------------- Lower Extremity Assessment Details Patient Name: Date of Service: Lindaann Slough [name] 10/28/2019 2:30 PM Medical Record Number: [id number] Patient Account Number: [id number] Date of Birth/Sex: Treating RN: 07-21-1930 (86 y.o. [name] Primary Care Janai Brannigan: Henderson Cloud NE, A NGELA Other Clinician: Referring Faithann Natal: Treating Shahmeer Bunn/Extender: Maudie Flakes NE, A NGELA Weeks in Treatment: 28 Edema Assessment Assessed: [Left: No] [Right: No] Edema: [Left: Yes] [Right: Yes] Calf Left: Right: Point of Measurement: 35 cm From Medial Instep 27.4 cm 27.9 cm Ankle Left: Right: Point of Measurement: 7 cm From Medial Instep 23.3 cm 23.8 cm Vascular Assessment Pulses: Dorsalis Pedis Palpable: [Left:Yes] [Right:Yes] Electronic Signature(s) Signed: 10/28/2019 5:25:51 PM By: [name] RN, BSN Signed: 11/07/2019 5:50:16 PM By: [name] RN Entered By: [name] on 10/28/2019 16:49:55 -------------------------------------------------------------------------------- Multi Wound Chart Details Patient Name: Date of Service: Lindaann Slough [name] 10/28/2019 2:30 PM Medical Record Number: [id number] Patient Account Number: [id number] Date of Birth/Sex: Treating RN: 1930/09/22 (86 y.o. [name] Primary Care Caellum Mancil: Henderson Cloud NE, A NGELA Other  Clinician: Referring Altair Appenzeller: Treating Shelton Square/Extender: Maudie Flakes NE, A NGELA Weeks in Treatment: 28 Vital Signs Height(in): 60 Pulse(bpm): 75 Weight(lbs): 130 Blood Pressure(mmHg): 177/73 Body Mass Index(BMI): 25 Temperature(F): 98.4 Respiratory Rate(breaths/min): 18 Photos: [6:No Photos Right Lower Leg] [8:No Photos Left, Anterior Lower Leg] [N/A:N/A N/A] Wound Location: [6:Gradually Appeared] [8:Trauma] [N/A:N/A] Wounding Event: [6:Lymphedema] [8:Trauma, Other] [N/A:N/A] Primary Etiology: [6:N/A] [8:Lymphedema] [N/A:N/A] Secondary Etiology: [6:Cataracts, Glaucoma, Middle ear] [8:Cataracts, Glaucoma, Middle ear] [N/A:N/A]  Comorbid History: [6:problems, Anemia, Hypertension 03/14/2019] [8:problems, Anemia, Hypertension 09/04/2019] [N/A:N/A] Date Acquired: [6:28] [8:7] [N/A:N/A] Weeks of Treatment: [6:Open] [8:Open] [N/A:N/A] Wound Status: [6:6.5x11.8x0.2] [8:1.3x2.4x0.1] [N/A:N/A] Measurements L x W x D (cm) [6:60.24] [8:2.45] [N/A:N/A] A (cm) : rea [6:12.048] [8:0.245] [N/A:N/A] Volume (cm) : [6:55.10%] [8:67.00%] [N/A:N/A] % Reduction in Area: [6:77.60%] [8:67.00%] [N/A:N/A] % Reduction in Volume: [6:Full Thickness Without Exposed] [8:Full Thickness Without Exposed] [N/A:N/A] Classification: [6:Support Structures Large] [8:Support Structures Small] [N/A:N/A] Exudate A mount: [6:Serosanguineous] [8:Serosanguineous] [N/A:N/A] Exudate Type: [6:red, brown] [8:red, brown] [N/A:N/A] Exudate Color: [6:Distinct, outline attached] [8:Distinct, outline attached] [N/A:N/A] Wound Margin: [6:Small (1-33%)] [8:Medium (34-66%)] [N/A:N/A] Granulation A mount: [6:Pink] [8:Red] [N/A:N/A] Granulation Quality: [6:Large (67-100%)] [8:Medium (34-66%)] [N/A:N/A] Necrotic A mount: [6:Fat Layer (Subcutaneous Tissue): Yes Fat Layer (Subcutaneous Tissue): Yes N/A] Exposed Structures: [6:Fascia: No Tendon: No Muscle: No Joint: No Bone: No Small (1-33%)] [8:Fascia: No Tendon: No Muscle: No  Joint: No Bone: No Small (1-33%)] [N/A:N/A] Epithelialization: [6:Debridement - Excisional] [8:Debridement - Excisional] [N/A:N/A] Debridement: Pre-procedure Verification/Time Out 15:53 [8:15:53] [N/A:N/A] Taken: [6:Lidocaine 5% topical ointment] [8:Lidocaine 5% topical ointment] [N/A:N/A] Pain Control: [6:Subcutaneous, Slough] [8:Subcutaneous] [N/A:N/A] Tissue Debrided: [6:Skin/Subcutaneous Tissue] [8:Skin/Subcutaneous Tissue] [N/A:N/A] Level: [6:76.7] [8:3.12] [N/A:N/A] Debridement A (sq cm): [6:rea Curette] [8:Curette] [N/A:N/A] Instrument: [6:Moderate] [8:Moderate] [N/A:N/A] Bleeding: [6:Pressure] [8:Pressure] [N/A:N/A] Hemostasis A chieved: [6:0] [8:0] [N/A:N/A] Procedural Pain: [6:0] [8:0] [N/A:N/A] Post Procedural Pain: [6:Procedure was tolerated well] [8:Procedure was tolerated well] [N/A:N/A] Debridement Treatment Response: [6:6.5x11.8x0.2] [8:1.3x2.4x0.1] [N/A:N/A] Post Debridement Measurements L x W x D (cm) [6:12.048] [8:0.245] [N/A:N/A] Post Debridement Volume: (cm) [6:Debridement] [8:Debridement] [N/A:N/A] Treatment Notes Wound #6 (Right Lower Leg) 1. Cleanse With Wound Cleanser 3. Primary Dressing Applied Iodoflex 4. Secondary Dressing ABD Pad Dry Gauze 6. Support Layer Applied 3 layer compression wrap Notes netting Wound #8 (Left, Anterior Lower Leg) 1. Cleanse With Wound Cleanser 3. Primary Dressing Applied Iodoflex 4. Secondary Dressing ABD Pad Dry Gauze 6. Support Layer Applied 3 layer compression wrap Notes netting Electronic Signature(s) Signed: 11/07/2019 5:50:16 PM By: [name] RN Entered By: [name] on 10/28/2019 16:53:09 -------------------------------------------------------------------------------- [place] Details Patient Name: Date of Service: Lindaann Slough [name] 10/28/2019 2:30 PM Medical Record Number: [id number] Patient Account Number: [id number] Date of Birth/Sex: Treating RN: 04-17-1930 (86 y.o. [name] Primary Care Zamiah Tollett: Henderson Cloud NE, A NGELA Other Clinician: Referring Daaiyah Baumert: Treating Jacorey Donaway/Extender: Maudie Flakes NE, A NGELA Weeks in Treatment: 28 Active Inactive Wound/Skin Impairment Nursing Diagnoses: Impaired tissue integrity Knowledge deficit related to ulceration/compromised skin integrity Goals: Patient/caregiver will verbalize understanding of skin care regimen Date Initiated: 04/14/2019 Target Resolution Date: 11/07/2019 Goal Status: Active Ulcer/skin breakdown will have a volume reduction of 30% by week 4 Date Initiated: 04/14/2019 Date Inactivated: 05/19/2019 Target Resolution Date: 05/16/2019 Goal Status: Met Ulcer/skin breakdown will have a volume reduction of 50% by week 8 Date Initiated: 05/19/2019 Date Inactivated: 06/16/2019 Target Resolution Date: 06/20/2019 Goal Status: Met Interventions: Assess patient/caregiver ability to obtain necessary supplies Assess patient/caregiver ability to perform ulcer/skin care regimen upon admission and as needed Assess ulceration(s) every visit Provide education on ulcer and skin care Notes: Electronic Signature(s) Signed: 11/07/2019 5:50:16 PM By: [name] RN Entered By: [name] on 10/28/2019 16:50:44 -------------------------------------------------------------------------------- Pain Assessment Details Patient Name: Date of Service: Lindaann Slough [name] 10/28/2019 2:30 PM Medical Record Number: [id number] Patient Account Number: [id number] Date of Birth/Sex: Treating RN: 1930/04/29 (86 y.o. [name] Primary Care Jeancarlo Leffler: Henderson Cloud NE, A NGELA Other Clinician: Referring Chizaram Latino:  Treating Merri Dimaano/Extender: Maudie Flakes NE, A NGELA Weeks in Treatment: 28 Active Problems Location of Pain Severity and Description of Pain Patient Has Paino No Site Locations With Dressing Change: Yes Duration of the Pain. Constant / Intermittento Intermittent Rate the pain. Current Pain  Level: 0 Worst Pain Level: 5 Least Pain Level: 0 Character of Pain Describe the Pain: Aching, Other: stinging Pain Management and Medication Current Pain Management: Other: time Is the Current Pain Management Adequate: Adequate How does your wound impact your activities of daily livingo Sleep: No Bathing: No Appetite: No Relationship With Others: No Bladder Continence: No Emotions: No Bowel Continence: No Work: No Toileting: No Drive: No Dressing: No Hobbies: No Electronic Signature(s) Signed: 10/28/2019 5:25:51 PM By: [name] RN, BSN Signed: 11/07/2019 5:50:16 PM By: [name] RN Entered By: [name] on 10/28/2019 16:49:45 -------------------------------------------------------------------------------- Patient/Caregiver Education Details Patient Name: Date of Service: Lindaann Slough [name] 8/17/2021andnbsp2:30 PM Medical Record Number: [id number] Patient Account Number: [id number] Date of Birth/Gender: Treating RN: 07-03-1930 (86 y.o. [name] Primary Care Physician: Tedra Coupe, A NGELA Other Clinician: Referring Physician: Treating Physician/Extender: Maudie Flakes NE, A NGELA Weeks in Treatment: 28 Education Assessment Education Provided To: Patient Education Topics Provided Wound/Skin Impairment: Methods: Explain/Verbal Responses: State content correctly Electronic Signature(s) Signed: 11/07/2019 5:50:16 PM By: [name] RN Entered By: [name] on 10/28/2019 16:51:04 -------------------------------------------------------------------------------- Wound Assessment Details Patient Name: Date of Service: Lindaann Slough [name] 10/28/2019 2:30 PM Medical Record Number: [id number] Patient Account Number: [id number] Date of Birth/Sex: Treating RN: [birth year] (86 y.o. [name] Primary Care Cali Hope: Henderson Cloud NE, A NGELA Other Clinician: Referring Yelina Sarratt: Treating Burnette Sautter/Extender: Coral Else NE, A NGELA Weeks  in Treatment: 28 Wound Status Wound Number: 6 Primary Lymphedema Etiology: Wound Location: Right Lower Leg Wound Status: Open Wounding Event: Gradually Appeared Comorbid Cataracts, Glaucoma, Middle ear problems, Anemia, Date Acquired: 03/14/2019 History: Hypertension Weeks Of Treatment: 28 Clustered Wound: No Wound Measurements Length: (cm) 6.5 Width: (cm) 11.8 Depth: (cm) 0.2 Area: (cm) 60.24 Volume: (cm) 12.048 % Reduction in Area: 55.1% % Reduction in Volume: 77.6% Epithelialization: Small (1-33%) Tunneling: No Undermining: No Wound Description Classification: Full Thickness Without Exposed Support Structures Wound Margin: Distinct, outline attached Exudate Amount: Large Exudate Type: Serosanguineous Exudate Color: red, brown Foul Odor After Cleansing: No Slough/Fibrino Yes Wound Bed Granulation Amount: Small (1-33%) Exposed Structure Granulation Quality: Pink Fascia Exposed: No Necrotic Amount: Large (67-100%) Fat Layer (Subcutaneous Tissue) Exposed: Yes Necrotic Quality: Adherent Slough Tendon Exposed: No Muscle Exposed: No Joint Exposed: No Bone Exposed: No Treatment Notes Wound #6 (Right Lower Leg) 1. Cleanse With Wound Cleanser 3. Primary Dressing Applied Iodoflex 4. Secondary Dressing ABD Pad Dry Gauze 6. Support Layer Applied 3 layer compression wrap Notes netting Electronic Signature(s) Signed: 10/28/2019 5:25:51 PM By: [name] RN, BSN Entered By: [name] on 10/28/2019 15:11:00 -------------------------------------------------------------------------------- Wound Assessment Details Patient Name: Date of Service: Lindaann Slough [name] 10/28/2019 2:30 PM Medical Record Number: [id number] Patient Account Number: [id number] Date of Birth/Sex: Treating RN: 11-Sep-1930 (86 y.o. [name] Primary Care Oisin Yoakum: Henderson Cloud NE, A NGELA Other Clinician: Referring Yelena Metzer: Treating Raye Wiens/Extender: Coral Else NE, A  NGELA Weeks in Treatment: 28 Wound Status Wound Number: 8 Primary Etiology: Trauma, Other Wound Location: Left, Anterior Lower Leg Secondary Lymphedema Etiology: Wounding Event: Trauma Wound Status: Open Date Acquired: 09/04/2019 Comorbid Cataracts, Glaucoma, Middle ear problems, Anemia, Weeks Of Treatment:  7 History: Hypertension Clustered Wound: No Photos Photo Uploaded By: Mikeal Hawthorne on 10/30/2019 15:21:54 Wound Measurements Length: (cm) 1.3 Width: (cm) 2.4 Depth: (cm) 0.1 Area: (cm) 2.45 Volume: (cm) 0.245 % Reduction in Area: 67% % Reduction in Volume: 67% Epithelialization: Small (1-33%) Tunneling: No Undermining: No Wound Description Classification: Full Thickness Without Exposed Support Structu Wound Margin: Distinct, outline attached Exudate Amount: Small Exudate Type: Serosanguineous Exudate Color: red, brown res Foul Odor After Cleansing: No Slough/Fibrino Yes Wound Bed Granulation Amount: Medium (34-66%) Exposed Structure Granulation Quality: Red Fascia Exposed: No Necrotic Amount: Medium (34-66%) Fat Layer (Subcutaneous Tissue) Exposed: Yes Necrotic Quality: Adherent Slough Tendon Exposed: No Muscle Exposed: No Joint Exposed: No Bone Exposed: No Treatment Notes Wound #8 (Left, Anterior Lower Leg) 1. Cleanse With Wound Cleanser 3. Primary Dressing Applied Iodoflex 4. Secondary Dressing ABD Pad Dry Gauze 6. Support Layer Applied 3 layer compression wrap Notes netting Electronic Signature(s) Signed: 10/28/2019 5:25:51 PM By: [name] RN, BSN Entered By: [name] on 10/28/2019 15:11:23 -------------------------------------------------------------------------------- [place] Details Patient Name: Date of Service: Lindaann Slough [name] 10/28/2019 2:30 PM Medical Record Number: [id number] Patient Account Number: [id number] Date of Birth/Sex: Treating RN: 11/01/1930 (86 y.o. [name] Primary Care Taron Conrey: Henderson Cloud NE, A  NGELA Other Clinician: Referring Twanna Resh: Treating Charly Holcomb/Extender: Maudie Flakes NE, A NGELA Weeks in Treatment: 28 Vital Signs Time Taken: 14:36 Temperature (F): 98.4 Height (in): 60 Pulse (bpm): 75 Source: Stated Respiratory Rate (breaths/min): 18 Weight (lbs): 130 Blood Pressure (mmHg): 177/73 Source: Stated Reference Range: 80 - 120 mg / dl Body Mass Index (BMI): 25.4 Electronic Signature(s) Signed: 11/07/2019 5:50:16 PM By: [name] RN Entered By: [name] on 10/28/2019 16:49:32

## 2019-11-18 ENCOUNTER — Encounter (HOSPITAL_BASED_OUTPATIENT_CLINIC_OR_DEPARTMENT_OTHER): Payer: Medicare HMO | Attending: Internal Medicine | Admitting: Internal Medicine

## 2019-11-18 DIAGNOSIS — I872 Venous insufficiency (chronic) (peripheral): Secondary | ICD-10-CM | POA: Insufficient documentation

## 2019-11-18 DIAGNOSIS — D649 Anemia, unspecified: Secondary | ICD-10-CM | POA: Insufficient documentation

## 2019-11-18 DIAGNOSIS — H409 Unspecified glaucoma: Secondary | ICD-10-CM | POA: Insufficient documentation

## 2019-11-18 DIAGNOSIS — R609 Edema, unspecified: Secondary | ICD-10-CM | POA: Insufficient documentation

## 2019-11-18 DIAGNOSIS — L03116 Cellulitis of left lower limb: Secondary | ICD-10-CM | POA: Diagnosis not present

## 2019-11-18 DIAGNOSIS — L97812 Non-pressure chronic ulcer of other part of right lower leg with fat layer exposed: Secondary | ICD-10-CM | POA: Insufficient documentation

## 2019-11-18 DIAGNOSIS — I10 Essential (primary) hypertension: Secondary | ICD-10-CM | POA: Diagnosis not present

## 2019-11-18 DIAGNOSIS — I890 Lymphedema, not elsewhere classified: Secondary | ICD-10-CM | POA: Insufficient documentation

## 2019-11-18 DIAGNOSIS — Z853 Personal history of malignant neoplasm of breast: Secondary | ICD-10-CM | POA: Insufficient documentation

## 2019-11-18 DIAGNOSIS — L97822 Non-pressure chronic ulcer of other part of left lower leg with fat layer exposed: Secondary | ICD-10-CM | POA: Insufficient documentation

## 2019-11-19 NOTE — Progress Notes (Signed)
Erin, [name] ([id number]) Visit Report for 11/18/2019 Arrival Information Details Patient Name: Date of Service: Erin [name] RLO TTE Erin. 11/18/2019 1:30 PM Medical Record Number: [id number] Patient Account Number: [id number] Date of Birth/Sex: Treating RN: 12/30/1930 (87 y.o. Erin [name] Primary Care Provider: Henderson [name] NE, Erin [name] Other Clinician: Referring Provider: Treating Provider/Extender: Erin [name] NE, Erin [name] Weeks in Treatment: 59 Visit Information History Since Last Visit Added or deleted any medications: No Patient Arrived: Wheel Chair Any new allergies or adverse reactions: No Arrival Time: 13:44 Had Erin fall or experienced change in No Accompanied By: caregiver activities of daily living that may affect Transfer Assistance: None risk of falls: Patient Identification Verified: Yes Signs or symptoms of abuse/neglect since last visito No Secondary Verification Process Completed: Yes Hospitalized since last visit: No Patient Requires Transmission-Based Precautions: No Implantable device outside of the clinic excluding No Patient Has Alerts: Yes cellular tissue based products placed in the center Patient Alerts: NON COMPRESSABLE since last visit: Has Dressing in Place as Prescribed: Yes Pain Present Now: No Electronic Signature(s) Signed: 11/19/2019 1:03:45 PM By: Erin [name] Entered By: Erin [name] on 11/18/2019 13:44:30 -------------------------------------------------------------------------------- Compression Therapy Details Patient Name: Date of Service: Erin [name] RLO TTE Erin. 11/18/2019 1:30 PM Medical Record Number: [id number] Patient Account Number: [id number] Date of Birth/Sex: Treating RN: 1930-05-22 (86 y.o. Erin [name] Primary Care Provider: Henderson [name] NE, Erin [name] Other Clinician: Referring Provider: Treating Provider/Extender: Erin [name] NE, Erin [name] Weeks in Treatment: 31 Compression Therapy Performed for Wound Assessment:  Wound #6 Right Lower Leg Performed By: Clinician Erin Coria, RN Compression Type: Three Layer Post Procedure Diagnosis Same as Pre-procedure Electronic Signature(s) Signed: 11/19/2019 5:14:09 PM By: Erin Coria RN Entered By: Erin [name] on 11/18/2019 14:37:04 -------------------------------------------------------------------------------- Compression Therapy Details Patient Name: Date of Service: Erin [name] RLO TTE Erin. 11/18/2019 1:30 PM Medical Record Number: [id number] Patient Account Number: [id number] Date of Birth/Sex: Treating RN: Sep 01, 1930 (86 y.o. Erin [name] Primary Care Provider: Henderson [name] NE, Erin [name] Other Clinician: Referring Provider: Treating Provider/Extender: Erin [name] NE, Erin [name] Weeks in Treatment: 31 Compression Therapy Performed for Wound Assessment: Wound #8 Left,Anterior Lower Leg Performed By: Clinician Erin Coria, RN Compression Type: Three Layer Post Procedure Diagnosis Same as Pre-procedure Electronic Signature(s) Signed: 11/19/2019 5:14:09 PM By: Erin Coria RN Entered By: Erin [name] on 11/18/2019 14:37:05 -------------------------------------------------------------------------------- Encounter Discharge Information Details Patient Name: Date of Service: Erin [name] RLO TTE Erin. 11/18/2019 1:30 PM Medical Record Number: [id number] Patient Account Number: [id number] Date of Birth/Sex: Treating RN: Jan 08, 1931 (86 y.o. Erin [name] Primary Care Provider: Henderson [name] NE, Erin [name] Other Clinician: Referring Provider: Treating Provider/Extender: Erin [name] NE, Erin [name] Weeks in Treatment: 66 Encounter Discharge Information Items Discharge Condition: Stable Ambulatory Status: Wheelchair Discharge Destination: Home Transportation: Private Auto Accompanied By: friend Schedule Follow-up Appointment: Yes Clinical Summary of Care: Patient Declined Electronic Signature(s) Signed: 11/18/2019 5:32:29 PM By: Erin Hurst RN,  BSN Entered By: Erin [name] on 11/18/2019 15:00:20 -------------------------------------------------------------------------------- Lower Extremity Assessment Details Patient Name: Date of Service: Erin [name] RLO TTE Erin. 11/18/2019 1:30 PM Medical Record Number: [id number] Patient Account Number: [id number] Date of Birth/Sex: Treating RN: Aug 06, 1930 (86 y.o. Erin [name] Primary Care Provider: Henderson [name] NE, Erin [name] Other Clinician: Referring Provider: Treating Provider/Extender: Erin [name] NE, Erin [name] Weeks in Treatment: 31 Edema Assessment Assessed: [Left: No] [Right: No] Edema: [Left: Yes] [Right: Yes]  Calf Left: Right: Point of Measurement: 35 cm From Medial Instep 27.4 cm 27.9 cm Ankle Left: Right: Point of Measurement: 7 cm From Medial Instep 23.3 cm 23.8 cm Vascular Assessment Pulses: Dorsalis Pedis Palpable: [Left:Yes] [Right:Yes] Electronic Signature(s) Signed: 11/18/2019 5:32:29 PM By: Erin Hurst RN, BSN Entered By: Erin [name] on 11/18/2019 14:08:29 -------------------------------------------------------------------------------- Multi Wound Chart Details Patient Name: Date of Service: Erin [name] RLO TTE Erin. 11/18/2019 1:30 PM Medical Record Number: [id number] Patient Account Number: [id number] Date of Birth/Sex: Treating RN: 04-10-1930 (86 y.o. Erin [name] Primary Care Kaytlen Lightsey: Erin [name] NE, Erin [name] Other Clinician: Referring Alicia Seib: Treating Jarvin Ogren/Extender: Erin [name] NE, Erin [name] Weeks in Treatment: 31 Vital Signs Height(in): 60 Pulse(bpm): 91 Weight(lbs): 130 Blood Pressure(mmHg): 169/74 Body Mass Index(BMI): 25 Temperature(F): 98.0 Respiratory Rate(breaths/min): 18 Photos: [6:No Photos Right Lower Leg] [8:No Photos Left, Anterior Lower Leg] [N/Erin:N/Erin N/Erin] Wound Location: [6:Gradually Appeared] [8:Trauma] [N/Erin:N/Erin] Wounding Event: [6:Lymphedema] [8:Trauma, Other] [N/Erin:N/Erin] Primary Etiology: [6:N/Erin] [8:Lymphedema]  [N/Erin:N/Erin] Secondary Etiology: [6:Cataracts, Glaucoma, Middle ear] [8:Cataracts, Glaucoma, Middle ear] [N/Erin:N/Erin] Comorbid History: [6:problems, Anemia, Hypertension 03/14/2019] [8:problems, Anemia, Hypertension 09/04/2019] [N/Erin:N/Erin] Date Acquired: [6:31] [8:10] [N/Erin:N/Erin] Weeks of Treatment: [6:Open] [8:Open] [N/Erin:N/Erin] Wound Status: [6:5.3x9.5x0.2] [8:1.2x1.8x0.1] [N/Erin:N/Erin] Measurements L x W x D (cm) [6:39.545] [8:1.696] [N/Erin:N/Erin] Erin (cm) : rea [3:8.250] [8:0.17] [N/Erin:N/Erin] Volume (cm) : [6:70.60%] [8:77.10%] [N/Erin:N/Erin] % Reduction in Area: [6:85.30%] [8:77.10%] [N/Erin:N/Erin] % Reduction in Volume: [6:Full Thickness Without Exposed] [8:Full Thickness Without Exposed] [N/Erin:N/Erin] Classification: [6:Support Structures Large] [8:Support Structures Medium] [N/Erin:N/Erin] Exudate Amount: [6:Serosanguineous] [8:Serosanguineous] [N/Erin:N/Erin] Exudate Type: [6:red, brown] [8:red, brown] [N/Erin:N/Erin] Exudate Color: [6:Distinct, outline attached] [8:Distinct, outline attached] [N/Erin:N/Erin] Wound Margin: [6:Medium (34-66%)] [8:Large (67-100%)] [N/Erin:N/Erin] Granulation Amount: [6:Pink] [8:Pink] [N/Erin:N/Erin] Granulation Quality: [6:Medium (34-66%)] [8:Small (1-33%)] [N/Erin:N/Erin] Necrotic Amount: [6:Fat Layer (Subcutaneous Tissue): Yes Fat Layer (Subcutaneous Tissue): Yes N/Erin] Exposed Structures: [6:Fascia: No Tendon: No Muscle: No Joint: No Bone: No Small (1-33%)] [8:Fascia: No Tendon: No Muscle: No Joint: No Bone: No Small (1-33%)] [N/Erin:N/Erin] Epithelialization: [6:Compression Therapy] [8:Compression Therapy] [N/Erin:N/Erin] Procedures Performed: Treatment Notes Wound #6 (Right Lower Leg) [6:1. Cleanse With Soap and water 2. Periwound Care Moisturizing lotion 3. Primary Dressing Applied Iodoflex 4. Secondary Dressing ABD Pad Kerramax/Xtrasorb 6. Support Layer Applied 3 layer compression wrap] Notes netting Wound #8 (Left, Anterior Lower Leg) 1. Cleanse With Soap and water 2. Periwound Care Moisturizing lotion 3. Primary Dressing  Applied Iodoflex 4. Secondary Dressing Dry Gauze 6. Support Layer Applied 3 layer compression wrap Notes netting Electronic Signature(s) Signed: 11/18/2019 5:12:55 PM By: Linton Ham MD Signed: 11/19/2019 5:14:09 PM By: Erin Coria RN Entered By: Linton Ham on 11/18/2019 15:14:05 -------------------------------------------------------------------------------- Multi-Disciplinary Care Plan Details Patient Name: Date of Service: Erin [name] RLO TTE Erin. 11/18/2019 1:30 PM Medical Record Number: [id number] Patient Account Number: [id number] Date of Birth/Sex: Treating RN: 17-Dec-1930 (86 y.o. Erin [name] Primary Care Jari Dipasquale: Erin [name] NE, Erin [name] Other Clinician: Referring Rual Vermeer: Treating Kaycie Pegues/Extender: Erin [name] NE, Erin [name] Weeks in Treatment: 31 Active Inactive Wound/Skin Impairment Nursing Diagnoses: Impaired tissue integrity Knowledge deficit related to ulceration/compromised skin integrity Goals: Patient/caregiver will verbalize understanding of skin care regimen Date Initiated: 04/14/2019 Target Resolution Date: 11/07/2019 Goal Status: Active Ulcer/skin breakdown will have Erin volume reduction of 30% by week 4 Date Initiated: 04/14/2019 Date Inactivated: 05/19/2019 Target Resolution Date: 05/16/2019 Goal Status: Met Ulcer/skin breakdown will have Erin volume reduction of 50% by week 8 Date Initiated: 05/19/2019 Date Inactivated: 06/16/2019 Target Resolution Date: 06/20/2019 Goal Status: Met Interventions: Assess  patient/caregiver ability to obtain necessary supplies Assess patient/caregiver ability to perform ulcer/skin care regimen upon admission and as needed Assess ulceration(s) every visit Provide education on ulcer and skin care Notes: Electronic Signature(s) Signed: 11/19/2019 5:14:09 PM By: Erin Coria RN Entered By: Erin [name] on 11/18/2019 13:52:40 -------------------------------------------------------------------------------- Pain Assessment  Details Patient Name: Date of Service: Erin [name] RLO TTE Erin. 11/18/2019 1:30 PM Medical Record Number: [id number] Patient Account Number: [id number] Date of Birth/Sex: Treating RN: 02-04-1931 (86 y.o. Erin [name] Primary Care Provider: Henderson [name] NE, Erin [name] Other Clinician: Referring Provider: Treating Provider/Extender: Erin [name] NE, Erin [name] Weeks in Treatment: 31 Active Problems Location of Pain Severity and Description of Pain Patient Has Paino No Site Locations Pain Management and Medication Current Pain Management: Electronic Signature(s) Signed: 11/19/2019 1:03:45 PM By: Erin [name] Signed: 11/19/2019 5:14:09 PM By: Erin Coria RN Entered By: Erin [name] on 11/18/2019 13:44:52 -------------------------------------------------------------------------------- Patient/Caregiver Education Details Patient Name: Date of Service: Erin [name] RLO TTE Erin. 9/7/2021andnbsp1:30 PM Medical Record Number: [id number] Patient Account Number: [id number] Date of Birth/Gender: Treating RN: 04/18/1930 (86 y.o. Erin [name] Primary Care Physician: Tedra Coupe, Erin [name] Other Clinician: Referring Physician: Treating Physician/Extender: Erin [name] NE, Erin [name] Weeks in Treatment: 10 Education Assessment Education Provided To: Patient Education Topics Provided Wound/Skin Impairment: Methods: Explain/Verbal Responses: State content correctly Electronic Signature(s) Signed: 11/19/2019 5:14:09 PM By: Erin Coria RN Entered By: Erin [name] on 11/18/2019 13:52:58 -------------------------------------------------------------------------------- Wound Assessment Details Patient Name: Date of Service: Erin [name] RLO TTE Erin. 11/18/2019 1:30 PM Medical Record Number: [id number] Patient Account Number: [id number] Date of Birth/Sex: Treating RN: Jul 31, 1930 (86 y.o. Erin [name] Primary Care Provider: Henderson [name] NE, Erin [name] Other Clinician: Referring Provider: Treating  Provider/Extender: Erin [name] NE, Erin [name] Weeks in Treatment: 31 Wound Status Wound Number: 6 Primary Lymphedema Etiology: Wound Location: Right Lower Leg Wound Status: Open Wounding Event: Gradually Appeared Comorbid Cataracts, Glaucoma, Middle ear problems, Anemia, Date Acquired: 03/14/2019 History: Hypertension Weeks Of Treatment: 31 Clustered Wound: No Wound Measurements Length: (cm) 5.3 Width: (cm) 9.5 Depth: (cm) 0.2 Area: (cm) 39.545 Volume: (cm) 7.909 % Reduction in Area: 70.6% % Reduction in Volume: 85.3% Epithelialization: Small (1-33%) Tunneling: No Undermining: No Wound Description Classification: Full Thickness Without Exposed Support Structures Wound Margin: Distinct, outline attached Exudate Amount: Large Exudate Type: Serosanguineous Exudate Color: red, brown Foul Odor After Cleansing: No [name]/Fibrino Yes Wound Bed Granulation Amount: Medium (34-66%) Exposed Structure Granulation Quality: Pink Fascia Exposed: No Necrotic Amount: Medium (34-66%) Fat Layer (Subcutaneous Tissue) Exposed: Yes Necrotic Quality: Adherent [name] Tendon Exposed: No Muscle Exposed: No Joint Exposed: No Bone Exposed: No Treatment Notes Wound #6 (Right Lower Leg) 1. Cleanse With Soap and water 2. Periwound Care Moisturizing lotion 3. Primary Dressing Applied Iodoflex 4. Secondary Dressing ABD Pad Kerramax/Xtrasorb 6. Support Layer Applied 3 layer compression wrap Notes netting Electronic Signature(s) Signed: 11/18/2019 5:32:29 PM By: Erin Hurst RN, BSN Signed: 11/19/2019 5:14:09 PM By: Erin Coria RN Entered By: Erin [name] on 11/18/2019 14:09:16 -------------------------------------------------------------------------------- Wound Assessment Details Patient Name: Date of Service: Erin [name] RLO TTE Erin. 11/18/2019 1:30 PM Medical Record Number: [id number] Patient Account Number: [id number] Date of Birth/Sex: Treating RN: 05-16-1930 (86 y.o. Erin [name] Primary Care Provider: Henderson [name] NE, Erin [name] Other Clinician: Referring Provider: Treating Provider/Extender: Erin [name] NE, Erin [name] Weeks in Treatment: 31 Wound Status Wound Number: 8 Primary Etiology: Trauma, Other Wound Location: Left,  Anterior Lower Leg Secondary Lymphedema Etiology: Wounding Event: Trauma Wound Status: Open Date Acquired: 09/04/2019 Comorbid Cataracts, Glaucoma, Middle ear problems, Anemia, Weeks Of Treatment: 10 History: Hypertension Clustered Wound: No Photos Photo Uploaded By: Mikeal Hawthorne on 11/19/2019 14:09:54 Wound Measurements Length: (cm) 1.2 Width: (cm) 1.8 Depth: (cm) 0.1 Area: (cm) 1.696 Volume: (cm) 0.17 % Reduction in Area: 77.1% % Reduction in Volume: 77.1% Epithelialization: Small (1-33%) Wound Description Classification: Full Thickness Without Exposed Support Structures Wound Margin: Distinct, outline attached Exudate Amount: Medium Exudate Type: Serosanguineous Exudate Color: red, brown Foul Odor After Cleansing: No [name]/Fibrino Yes Wound Bed Granulation Amount: Large (67-100%) Exposed Structure Granulation Quality: Pink Fascia Exposed: No Necrotic Amount: Small (1-33%) Fat Layer (Subcutaneous Tissue) Exposed: Yes Necrotic Quality: Adherent [name] Tendon Exposed: No Muscle Exposed: No Joint Exposed: No Bone Exposed: No Treatment Notes Wound #8 (Left, Anterior Lower Leg) 1. Cleanse With Soap and water 2. Periwound Care Moisturizing lotion 3. Primary Dressing Applied Iodoflex 4. Secondary Dressing Dry Gauze 6. Support Layer Applied 3 layer compression wrap Notes netting Electronic Signature(s) Signed: 11/18/2019 5:32:29 PM By: Erin Hurst RN, BSN Signed: 11/19/2019 5:14:09 PM By: Erin Coria RN Entered By: Erin [name] on 11/18/2019 14:09:35 -------------------------------------------------------------------------------- [place] Details Patient Name: Date of Service: Erin [name] RLO TTE  Erin. 11/18/2019 1:30 PM Medical Record Number: [id number] Patient Account Number: [id number] Date of Birth/Sex: Treating RN: 1930-08-12 (86 y.o. Erin [name] Primary Care Carreen Milius: Erin [name] NE, Erin [name] Other Clinician: Referring Hattie Pine: Treating Sequoyah Counterman/Extender: Erin [name] NE, Erin [name] Weeks in Treatment: 31 Vital Signs Time Taken: 13:44 Temperature (F): 98.0 Height (in): 60 Pulse (bpm): 91 Weight (lbs): 130 Respiratory Rate (breaths/min): 18 Body Mass Index (BMI): 25.4 Blood Pressure (mmHg): 169/74 Reference Range: 80 - 120 mg / dl Electronic Signature(s) Signed: 11/19/2019 1:03:45 PM By: Erin [name] Entered By: Erin [name] on 11/18/2019 13:44:46

## 2019-11-19 NOTE — Progress Notes (Signed)
DARRIONA, DEHAAS ([id number]) Visit Report for 11/18/2019 HPI Details Patient Name: Date of Service: Erin [name] [name] 11/18/2019 1:30 PM Medical Record Number: [id number] Patient Account Number: [id number] Date of Birth/Sex: Treating RN: [birth year] (86 y.o. [name] Primary Care Provider: Tedra Coupe, A NGELA Other Clinician: Referring Provider: Treating Provider/Extender: Maudie Flakes NE, A NGELA Weeks in Treatment: 31 History of Present Illness HPI Description: We to find a new wound on the left 03/22/17; this is an 86 year old woman. There is not a lot of information in care everywhere on this patient. She had a history of a malignant neoplasm her left breast for which she had lumpectomy but she did not have radiation. Apparently she has had long- standing edema and her lower legs and she was seen in the wound care center at [facility] in [place] by [name] October. Both her legs were wrapped however it sounds as though the left leg became secondarily infected she apparently had MRSA and she was admitted the hospital. Not really turn for wound care. She lives at home on her own and has apparently [facility] home health care. We are not exactly clear what [facility] is doing to her legs but it does not involve compression. She is not a diabetic. ABIs in our clinic were noncompressible bilaterally 03/30/17; this is a patient who has bilateral chronic venous insufficiency, severe venous inflammation. We had a nice description number legs from a note by [name] dating 2010 describing edematous warm legs with erythema. This suggests that she has chronic venous insufficiency with inflammation.. We have arterial studies dated 12/29/16 again from [facility]. This showed a right ABI at 1 and a TBI of 0.81 on the left the ABI was 1.25 and a TBI of 0.72. This was just she does not have significant arterial insufficiency. She had normal triphasic waveforms noted that she  was admitted to hospital in late October with MSSA cellulitis Last visit we wrapped both her legs. She had an open area predominantly on the left anterior but weeping edema on the right anterior leg. She has done well there is only one at anterior left tibial wound 04/06/17; the patient has no open area on her right leg. Her remaining wound is on the left anterior leg, the left posterior leg wound is healed. We have been using silver alginate changed to [facility] on the left leg today she has home health 04/13/17; still no open area on her right leg. Her remaining wound on the left anterior leg. We have been using using Hydrofera Blue 04/20/17; no open area on the right leg although the edema here is somewhat disfiguring. Her remaining wound is on the left anterior leg we've been using Hydrofera Blue with improvement in dimensions. She has [facility] home health and lives in [place]. She requests to come every 2 weeks because of the distance involved in coming here 05/04/17; there is no open area on the right leg. She has a very small left anterior leg wound that remains. However what remains on the left still has some depth and a not to viable looking surface. It is too small to really attempt debridement. She has home health coming out to her home in [place] 05/18/17; there is no open area on either leg. She has significant chronic venous insufficiency. The left anterior leg wound that was still open 2 weeks ago has closed. She has home health coming out. We are going to  order Juxtalite stockings for both legs. She is agreed to pay for these privately. We will order them from [facility] READMISSION 04/14/2019 This is a patient we previously had in this clinic in 2019 for 3 months. She had a wound on the left anterior greater than right anterior leg. According to my notes we discharged her and juxta lite stockings although it does not appear that she ever got them. The history here is a bit  difficult. She apparently has had a wound on the left lower leg for the last 6 months. She required admission to hospital in [facility] for apparent cellulitis just before Christmas was discharged home she has an open wound. She has a home health agency although we are not exactly sure which one. They are applying silver alginate. No compression Past medical history is reviewed she has chronic venous insufficiency and a history of left breast CA. 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 She did not have any compression on the wounds today. 2/8; substantial wound on the right posterior calf and a smaller area on the left medial calf. We are using Iodoflex to both wound areas under compression. We have better edema control We to find a new wound on the left anterior medial calf 2/15; the area on the left medial calf is closed over. Substantial area on the right posterior and lateral calf. We have been using Iodoflex although the patient complains of pain and wonders if there is not an alternative. She has home health changing the dressing twice a week and she sees [place] once 2/22; the wound on the left medial calf is closed over. Her edema control is not good here but she cannot get on stockings. This substantial wound is on the right lateral and right posterior calf. Changed her to a hydrofera Blue last week. Gradual improvement in wound area. 3/8; wound on the left leg remains closed. The substantial wound is on the right lateral and right posterior calf. This is measuring smaller 3/22; 2-week follow-up. Substantial area on the right posterior and lateral calf measures slightly smaller. We are using Hydrofera Blue under compression 4/5; 2-week follow-up. Substantial area on the right posterior lateral calf. This measures slightly smaller. She has islands of epithelialization we  have been using Hydrofera Blue under compression 4/19; 2-week follow-up. We have been using Hydrofera Blue however there is maceration around the wound. I will change to silver alginate today. She has islands of epithelialization however I did not see much evidence that things were improving here. 5/6; 2-week follow-up. I put silver alginate on this because of maceration around the wound last time. However she comes in today with a lot of very adherent debris on the wound of the right lateral leg. 5/17; 2-week follow-up. Using Hydrofera Blue under compression. The patient has epithelialization including islands of epithelialization in the middle of fairly substantial wound area on the right posterior lateral calf 6/1; 2-week follow-up. Using Hydrofera Blue under compression. She seems to have increasing epithelialization. This is an irregular wound going from medial to lateral posteriorly in the right posterior calf. This would not be easy wound to get reproducible measurement 08/25/2019 upon evaluation today patient appears to actually be doing better in regard to the wound on her leg at this point. We have been using Adaptic followed by [facility] this is keeping her  from getting stuck. The patient is aware that this is also potentially slowing down her healing simply due to the fact that the [facility] not in direct contact with the wound bed. Nonetheless she was not happy with the way it was sticking previous which is why we switched to this according to what I can read in the note and hear from the patient and her aide with her today. 6/28; 2-week follow-up. We are following this patient for severe wound on the right lower leg secondary to chronic venous insufficiency using Hydrofera Blue under compression we are making gradual improvements. She arrives in clinic today with what looks like a contusion on the left anterior mid tibia. She says she was washing her leg and pulled some skin.  However there is a fairly large wound with surrounding erythema and some swelling 7/12; 2-week follow-up. Right lower leg wound not quite as good as I thought. There is still islands of epithelialization we are using Hydrofera Blue with a contact layer I would like to see if we can get rid of the contact layer at this point. The new area from last time is a lot smaller She did not take the cephalexin I initially prescribed for the erythema edema around the left leg due to fears about diarrhea. She did tolerate doxycycline that we subsequently called in 7/26; we are doing 2-week follow-up. Wound measurements are about the same. Still the same fibrinous debris we have been using Hydrofera Blue. Change to Iodoflex today. 8/17; been using Iodoflex. Some improvement in the surface area of the large wound on the right posterior calf left anterior actually slightly larger. 100% [name] covered although it is less adherent 9/seven 3-week follow-up. Been using Iodoflex. We still have continued improvement in the surface area of the large wound on the right calf. The small area on the left anteriorly still is not closed. Electronic Signature(s) Signed: 11/18/2019 5:12:55 PM By: Linton Ham MD Entered By: Linton Ham on 11/18/2019 15:14:47 -------------------------------------------------------------------------------- Physical Exam Details Patient Name: Date of Service: Erin [name] [name] 11/18/2019 1:30 PM Medical Record Number: [id number] Patient Account Number: [id number] Date of Birth/Sex: Treating RN: [birth year] (86 y.o. [name] Primary Care Provider: Henderson Cloud NE, A NGELA Other Clinician: Referring Provider: Treating Provider/Extender: Maudie Flakes NE, A NGELA Weeks in Treatment: 48 Constitutional Patient is hypertensive.. Pulse regular and within target range for patient.Marland Kitchen Respirations regular, non-labored and within target range.. Temperature is normal and within the  target range for the patient.Marland Kitchen Appears in no distress. Cardiovascular Pedal pulses palpable on both sides. Notes Wound exam; area on the right lateral posterior calf still is open but with a healthier looking surface. Surface area is much better. Small anterior area on the left I thought it might be closed by now however it is still open. No evidence of infection in either area and no debridement is necessary Electronic Signature(s) Signed: 11/18/2019 5:12:55 PM By: Linton Ham MD Entered By: Linton Ham on 11/18/2019 15:16:11 -------------------------------------------------------------------------------- Physician Orders Details Patient Name: Date of Service: Erin [name] [name] 11/18/2019 1:30 PM Medical Record Number: [id number] Patient Account Number: [id number] Date of Birth/Sex: Treating RN: May 12, 1930 (86 y.o. [name] Primary Care Provider: Henderson Cloud NE, A NGELA Other Clinician: Referring Provider: Treating Provider/Extender: Maudie Flakes NE, A NGELA Weeks in Treatment: 3 Verbal / Phone Orders: No Diagnosis Coding ICD-10 Coding Code Description I87.331 Chronic venous hypertension (idiopathic) with ulcer and inflammation  of right lower extremity L97.812 Non-pressure chronic ulcer of other part of right lower leg with fat layer exposed L97.822 Non-pressure chronic ulcer of other part of left lower leg with fat layer exposed L03.116 Cellulitis of left lower limb I89.0 Lymphedema, not elsewhere classified Follow-up Appointments Return appointment in 3 weeks. Dressing Change Frequency Wound #6 Right Lower Leg Change dressing three times week. - by home health. Home health to change 2x a week on week that patient has appt at wound clinic. Wound #8 Left,Anterior Lower Leg Change dressing three times week. - by home health. Home health to change 2x a week on week that patient has appt at wound clinic. Skin Barriers/Peri-Wound Care Barrier cream - to  excoriated areas Moisturizing lotion - both legs Wound Cleansing May shower with protection. - use cast protector Primary Wound Dressing Wound #6 Right Lower Leg Iodoflex - or Iodosorb gel Wound #8 Left,Anterior Lower Leg Iodoflex - or Iodosorb gel Secondary Dressing Wound #6 Right Lower Leg ABD pad Zetuvit or Kerramax - or Xtrasorb (or other superabsorbent dressing) Wound #8 Left,Anterior Lower Leg Dry Gauze Edema Control 3 Layer Compression System - Bilateral Avoid standing for long periods of time Elevate legs to the level of the heart or above for 30 minutes daily and/or when sitting, a frequency of: - throughout the day [place] skilled nursing for wound care. - [facility] Electronic Signature(s) Signed: 11/18/2019 5:12:55 PM By: Linton Ham MD Signed: 11/19/2019 5:14:09 PM By: [name] RN Entered By: [name] on 11/18/2019 13:52:29 -------------------------------------------------------------------------------- Problem List Details Patient Name: Date of Service: Erin [name] [name] 11/18/2019 1:30 PM Medical Record Number: [id number] Patient Account Number: [id number] Date of Birth/Sex: Treating RN: [birth year] (86 y.o. [name] Primary Care Provider: Henderson Cloud NE, A NGELA Other Clinician: Referring Provider: Treating Provider/Extender: Maudie Flakes NE, A NGELA Weeks in Treatment: 31 Active Problems ICD-10 Encounter Encounter Code Description Active Date MDM Diagnosis I87.331 Chronic venous hypertension (idiopathic) with ulcer and inflammation of right 04/14/2019 No Yes lower extremity L97.812 Non-pressure chronic ulcer of other part of right lower leg with fat layer 04/14/2019 No Yes exposed L97.822 Non-pressure chronic ulcer of other part of left lower leg with fat layer exposed6/28/2021 No Yes L03.116 Cellulitis of left lower limb 09/08/2019 No Yes I89.0 Lymphedema, not elsewhere classified 04/14/2019 No Yes Inactive  Problems ICD-10 Code Description Active Date Inactive Date L97.821 Non-pressure chronic ulcer of other part of left lower leg limited to breakdown of skin 04/21/2019 04/21/2019 Resolved Problems Electronic Signature(s) Signed: 11/18/2019 5:12:55 PM By: Linton Ham MD Entered By: Linton Ham on 11/18/2019 15:13:56 -------------------------------------------------------------------------------- Progress Note Details Patient Name: Date of Service: Erin [name] [name] 11/18/2019 1:30 PM Medical Record Number: [id number] Patient Account Number: [id number] Date of Birth/Sex: Treating RN: [birth year] (86 y.o. [name] Primary Care Provider: Henderson Cloud NE, A NGELA Other Clinician: Referring Provider: Treating Provider/Extender: Maudie Flakes NE, A NGELA Weeks in Treatment: 31 Subjective History of Present Illness (HPI) We to find a new wound on the left 03/22/17; this is an 86 year old woman. There is not a lot of information in care everywhere on this patient. She had a history of a malignant neoplasm her left breast for which she had lumpectomy but she did not have radiation. Apparently she has had long-standing edema and her lower legs and she was seen in the wound care center at [facility] in [place] by [name] October. Both  her legs were wrapped however it sounds as though the left leg became secondarily infected she apparently had MRSA and she was admitted the hospital. Not really turn for wound care. She lives at home on her own and has apparently [facility] home health care. We are not exactly clear what [facility] is doing to her legs but it does not involve compression. She is not a diabetic. ABIs in our clinic were noncompressible bilaterally 03/30/17; this is a patient who has bilateral chronic venous insufficiency, severe venous inflammation. We had a nice description number legs from a note by [name] dating 2010 describing edematous warm legs with erythema. This  suggests that she has chronic venous insufficiency with inflammation.. We have arterial studies dated 12/29/16 again from [facility]. This showed a right ABI at 1 and a TBI of 0.81 on the left the ABI was 1.25 and a TBI of 0.72. This was just she does not have significant arterial insufficiency. She had normal triphasic waveforms noted that she was admitted to hospital in late October with MSSA cellulitis Last visit we wrapped both her legs. She had an open area predominantly on the left anterior but weeping edema on the right anterior leg. She has done well there is only one at anterior left tibial wound 04/06/17; the patient has no open area on her right leg. Her remaining wound is on the left anterior leg, the left posterior leg wound is healed. We have been using silver alginate changed to [facility] on the left leg today she has home health 04/13/17; still no open area on her right leg. Her remaining wound on the left anterior leg. We have been using using Hydrofera Blue 04/20/17; no open area on the right leg although the edema here is somewhat disfiguring. Her remaining wound is on the left anterior leg we've been using Hydrofera Blue with improvement in dimensions. She has [facility] home health and lives in [place]. She requests to come every 2 weeks because of the distance involved in coming here 05/04/17; there is no open area on the right leg. She has a very small left anterior leg wound that remains. However what remains on the left still has some depth and a not to viable looking surface. It is too small to really attempt debridement. She has home health coming out to her home in [place] [facility] [facility] [place] 05/18/17; there is no open area on either leg. She has significant chronic venous insufficiency. The left anterior leg wound that was still open 2 weeks ago has closed. She has home health coming out. We are going to order Juxtalite stockings for both legs. She is agreed to pay for these  privately. We will order them from [facility] READMISSION 04/14/2019 This is a patient we previously had in this clinic in 2019 for 3 months. She had a wound on the left anterior greater than right anterior leg. According to my notes we discharged her and juxta lite stockings although it does not appear that she ever got them. The history here is a bit difficult. She apparently has had a wound on the left lower leg for the last 6 months. She required admission to hospital in [facility] for apparent cellulitis just before Christmas was discharged home she has an open wound. She has a home health agency although we are not exactly sure which one. They are applying silver alginate. No compression Past medical history is reviewed she has chronic venous insufficiency and a history of left breast CA.  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 She did not have any compression on the wounds today. 2/8; substantial wound on the right posterior calf and a smaller area on the left medial calf. We are using Iodoflex to both wound areas under compression. We have better edema control We to find a new wound on the left anterior medial calf 2/15; the area on the left medial calf is closed over. Substantial area on the right posterior and lateral calf. We have been using Iodoflex although the patient complains of pain and wonders if there is not an alternative. She has home health changing the dressing twice a week and she sees [place] once 2/22; the wound on the left medial calf is closed over. Her edema control is not good here but she cannot get on stockings. This substantial wound is on the right lateral and right posterior calf. Changed her to a hydrofera Blue last week. Gradual improvement in wound area. 3/8; wound on the left leg remains closed. The substantial wound is on the right lateral and  right posterior calf. This is measuring smaller 3/22; 2-week follow-up. Substantial area on the right posterior and lateral calf measures slightly smaller. We are using Hydrofera Blue under compression 4/5; 2-week follow-up. Substantial area on the right posterior lateral calf. This measures slightly smaller. She has islands of epithelialization we have been using Hydrofera Blue under compression 4/19; 2-week follow-up. We have been using Hydrofera Blue however there is maceration around the wound. I will change to silver alginate today. She has islands of epithelialization however I did not see much evidence that things were improving here. 5/6; 2-week follow-up. I put silver alginate on this because of maceration around the wound last time. However she comes in today with a lot of very adherent debris on the wound of the right lateral leg. 5/17; 2-week follow-up. Using Hydrofera Blue under compression. The patient has epithelialization including islands of epithelialization in the middle of fairly substantial wound area on the right posterior lateral calf 6/1; 2-week follow-up. Using Hydrofera Blue under compression. She seems to have increasing epithelialization. This is an irregular wound going from medial to lateral posteriorly in the right posterior calf. This would not be easy wound to get reproducible measurement 08/25/2019 upon evaluation today patient appears to actually be doing better in regard to the wound on her leg at this point. We have been using Adaptic followed by [facility] this is keeping her from getting stuck. The patient is aware that this is also potentially slowing down her healing simply due to the fact that the [facility] not in direct contact with the wound bed. Nonetheless she was not happy with the way it was sticking previous which is why we switched to this according to what I can read in the note and hear from the patient and her aide with her today. 6/28;  2-week follow-up. We are following this patient for severe wound on the right lower leg secondary to chronic venous insufficiency using Hydrofera Blue under compression we are making gradual improvements. She arrives in clinic today with what looks like a contusion on the left anterior mid tibia. She says she was washing her leg and pulled some skin. However there is a fairly large wound with surrounding erythema and some swelling 7/12; 2-week follow-up. Right lower leg wound not quite  as good as I thought. There is still islands of epithelialization we are using Hydrofera Blue with a contact layer I would like to see if we can get rid of the contact layer at this point. ooThe new area from last time is a lot smaller ooShe did not take the cephalexin I initially prescribed for the erythema edema around the left leg due to fears about diarrhea. She did tolerate doxycycline that we subsequently called in 7/26; we are doing 2-week follow-up. Wound measurements are about the same. Still the same fibrinous debris we have been using Hydrofera Blue. Change to Iodoflex today. 8/17; been using Iodoflex. Some improvement in the surface area of the large wound on the right posterior calf left anterior actually slightly larger. 100% [name] covered although it is less adherent 9/seven 3-week follow-up. Been using Iodoflex. We still have continued improvement in the surface area of the large wound on the right calf. The small area on the left anteriorly still is not closed. Objective Constitutional Patient is hypertensive.. Pulse regular and within target range for patient.Marland Kitchen Respirations regular, non-labored and within target range.. Temperature is normal and within the target range for the patient.Marland Kitchen Appears in no distress. Vitals Time Taken: 1:44 PM, Height: 60 in, Weight: 130 lbs, BMI: 25.4, Temperature: 98.0 F, Pulse: 91 bpm, Respiratory Rate: 18 breaths/min, Blood Pressure: 169/74  mmHg. Cardiovascular Pedal pulses palpable on both sides. General Notes: Wound exam; area on the right lateral posterior calf still is open but with a healthier looking surface. Surface area is much better. ooSmall anterior area on the left I thought it might be closed by now however it is still open. ooNo evidence of infection in either area and no debridement is necessary Integumentary (Hair, Skin) Wound #6 status is Open. Original cause of wound was Gradually Appeared. The wound is located on the Right Lower Leg. The wound measures 5.3cm length x 9.5cm width x 0.2cm depth; 39.545cm^2 area and 7.909cm^3 volume. There is Fat Layer (Subcutaneous Tissue) exposed. There is no tunneling or undermining noted. There is a large amount of serosanguineous drainage noted. The wound margin is distinct with the outline attached to the wound base. There is medium (34-66%) pink granulation within the wound bed. There is a medium (34-66%) amount of necrotic tissue within the wound bed including Adherent [name]. Wound #8 status is Open. Original cause of wound was Trauma. The wound is located on the Left,Anterior Lower Leg. The wound measures 1.2cm length x 1.8cm width x 0.1cm depth; 1.696cm^2 area and 0.17cm^3 volume. There is Fat Layer (Subcutaneous Tissue) exposed. There is a medium amount of serosanguineous drainage noted. The wound margin is distinct with the outline attached to the wound base. There is large (67-100%) pink granulation within the wound bed. There is a small (1-33%) amount of necrotic tissue within the wound bed including Adherent [name]. Assessment Active Problems ICD-10 Chronic venous hypertension (idiopathic) with ulcer and inflammation of right lower extremity Non-pressure chronic ulcer of other part of right lower leg with fat layer exposed Non-pressure chronic ulcer of other part of left lower leg with fat layer exposed Cellulitis of left lower limb Lymphedema, not elsewhere  classified Procedures Wound #6 Pre-procedure diagnosis of Wound #6 is a Lymphedema located on the Right Lower Leg . There was a Three Layer Compression Therapy Procedure by [name], RN. Post procedure Diagnosis Wound #6: Same as Pre-Procedure Wound #8 Pre-procedure diagnosis of Wound #8 is a Trauma, Other located on the Left,Anterior Lower Leg .  There was a Three Layer Compression Therapy Procedure by [name], RN. Post procedure Diagnosis Wound #8: Same as Pre-Procedure Plan Follow-up Appointments: Return appointment in 3 weeks. Dressing Change Frequency: Wound #6 Right Lower Leg: Change dressing three times week. - by home health. Home health to change 2x a week on week that patient has appt at wound clinic. Wound #8 Left,Anterior Lower Leg: Change dressing three times week. - by home health. Home health to change 2x a week on week that patient has appt at wound clinic. Skin Barriers/Peri-Wound Care: Barrier cream - to excoriated areas Moisturizing lotion - both legs Wound Cleansing: May shower with protection. - use cast protector Primary Wound Dressing: Wound #6 Right Lower Leg: Iodoflex - or Iodosorb gel Wound #8 Left,Anterior Lower Leg: Iodoflex - or Iodosorb gel Secondary Dressing: Wound #6 Right Lower Leg: ABD pad Zetuvit or Kerramax - or Xtrasorb (or other superabsorbent dressing) Wound #8 Left,Anterior Lower Leg: Dry Gauze Edema Control: 3 Layer Compression System - Bilateral Avoid standing for long periods of time Elevate legs to the level of the heart or above for 30 minutes daily and/or when sitting, a frequency of: - throughout the day Home Health: [facility]. - [facility] 1. I am continuing Iodoflex to both areas/ABDs/3 layer compression bilaterally 2. We have had nice improvement in the large area on the right which is the primary reason to continue the Iodoflex surface of the wound looks a lot better. Watch  dimensions visit by visit. At 1 point I had a consideration of using an advanced treatment option however we continue to make nice progress without it. Furthermore the patient lives in [place] which would mean more frequent visits then she might like Electronic Signature(s) Signed: 11/18/2019 5:12:55 PM By: Linton Ham MD Entered By: Linton Ham on 11/18/2019 15:17:49 -------------------------------------------------------------------------------- SuperBill Details Patient Name: Date of Service: [name], CHA [name] 11/18/2019 Medical Record Number: [id number] Patient Account Number: [id number] Date of Birth/Sex: Treating RN: 1930/10/11 (86 y.o. [name] Primary Care Provider: Henderson Cloud NE, A NGELA Other Clinician: Referring Provider: Treating Provider/Extender: Maudie Flakes NE, A NGELA Weeks in Treatment: 31 Diagnosis Coding ICD-10 Codes Code Description I87.331 Chronic venous hypertension (idiopathic) with ulcer and inflammation of right lower extremity L97.812 Non-pressure chronic ulcer of other part of right lower leg with fat layer exposed L97.822 Non-pressure chronic ulcer of other part of left lower leg with fat layer exposed L03.116 Cellulitis of left lower limb I89.0 Lymphedema, not elsewhere classified Facility Procedures CPT4: Code [id number] Description: 81 BILATERAL: Application of multi-layer venous compression system; leg (below knee), including ankle and t. Modifier: Quantity: 1 Physician Procedures : CPT4 Code Description Modifier [id number] - WC PHYS LEVEL 3 - EST PT ICD-10 Diagnosis Description V40.086 Non-pressure chronic ulcer of other part of right lower leg with fat layer exposed L97.822 Non-pressure chronic ulcer of other part of left  lower leg with fat layer exposed I87.331 Chronic venous hypertension (idiopathic) with ulcer and inflammation of right lower extremity Quantity: 1 Electronic Signature(s) Signed: 11/18/2019 5:12:55 PM  By: Linton Ham MD Entered By: Linton Ham on 11/18/2019 15:18:19

## 2019-12-02 NOTE — Progress Notes (Signed)
[name], [name] ([id number]) Visit Report for 10/28/2019 Chief Complaint Document Details Patient Name: Date of Service: Erin [name] [name] 10/28/2019 2:30 PM Medical Record Number: [id number] Patient Account Number: [id number] Date of Birth/Sex: Treating RN: 1930-04-01 (86 y.o. Erin [name] Primary Care Provider: Henderson Cloud NE, A NGELA Other Clinician: Referring Provider: Treating Provider/Extender: Maudie Flakes NE, A NGELA Weeks in Treatment: 28 Information Obtained from: Patient Chief Complaint 03/22/17; patient is here for review of wounds on her bilateral legs Electronic Signature(s) Signed: 10/28/2019 6:57:29 PM By: Linton Ham MD Signed: 11/07/2019 5:50:16 PM By: [name] RN Entered By: [name] on 10/28/2019 16:54:04 -------------------------------------------------------------------------------- Debridement Details Patient Name: Date of Service: Erin [name] [name] 10/28/2019 2:30 PM Medical Record Number: [id number] Patient Account Number: [id number] Date of Birth/Sex: Treating RN: [birth year] (86 y.o. Erin [name] Primary Care Provider: Henderson Cloud NE, A NGELA Other Clinician: Referring Provider: Treating Provider/Extender: Maudie Flakes NE, A NGELA Weeks in Treatment: 28 Debridement Performed for Assessment: Wound #6 Right Lower Leg Performed By: Physician Ricard Dillon., MD Debridement Type: Debridement Level of Consciousness (Pre-procedure): Awake and Alert Pre-procedure Verification/Time Out Yes - 15:53 Taken: Start Time: 15:53 Pain Control: Lidocaine 5% topical ointment T Area Debrided (L x W): otal 6.5 (cm) x 11.8 (cm) = 76.7 (cm) Tissue and other material debrided: Viable, Non-Viable, [name], Subcutaneous, Skin: Dermis , Skin: Epidermis, [name] Level: Skin/Subcutaneous Tissue Debridement Description: Excisional Instrument: Curette Bleeding: Moderate Hemostasis Achieved: Pressure End Time: 15:56 Procedural Pain:  0 Post Procedural Pain: 0 Response to Treatment: Procedure was tolerated well Level of Consciousness (Post- Awake and Alert procedure): Post Debridement Measurements of Total Wound Length: (cm) 6.5 Width: (cm) 11.8 Depth: (cm) 0.2 Volume: (cm) 12.048 Character of Wound/Ulcer Post Debridement: Improved Post Procedure Diagnosis Same as Pre-procedure Electronic Signature(s) Signed: 12/01/2019 1:45:33 PM By: Linton Ham MD Signed: 12/02/2019 9:08:33 AM By: [name] RN, BSN Previous Signature: 11/07/2019 5:50:16 PM Version By: [name] RN Entered By: [name] on 12/01/2019 12:42:59 -------------------------------------------------------------------------------- Debridement Details Patient Name: Date of Service: Erin [name] [name] 10/28/2019 2:30 PM Medical Record Number: [id number] Patient Account Number: [id number] Date of Birth/Sex: Treating RN: 08-13-1930 (86 y.o. Erin [name] Primary Care Provider: Henderson Cloud NE, A NGELA Other Clinician: Referring Provider: Treating Provider/Extender: Maudie Flakes NE, A NGELA Weeks in Treatment: 28 Debridement Performed for Assessment: Wound #8 Left,Anterior Lower Leg Performed By: Physician Ricard Dillon., MD Debridement Type: Debridement Level of Consciousness (Pre-procedure): Awake and Alert Pre-procedure Verification/Time Out Yes - 15:53 Taken: Start Time: 15:53 Pain Control: Lidocaine 5% topical ointment T Area Debrided (L x W): otal 1.3 (cm) x 2.4 (cm) = 3.12 (cm) Tissue and other material debrided: Viable, Non-Viable, Subcutaneous, Skin: Dermis , Skin: Epidermis Level: Skin/Subcutaneous Tissue Debridement Description: Excisional Instrument: Curette Bleeding: Moderate Hemostasis Achieved: Pressure End Time: 15:56 Procedural Pain: 0 Post Procedural Pain: 0 Response to Treatment: Procedure was tolerated well Level of Consciousness (Post- Awake and Alert procedure): Post Debridement  Measurements of Total Wound Length: (cm) 1.3 Width: (cm) 2.4 Depth: (cm) 0.1 Volume: (cm) 0.245 Character of Wound/Ulcer Post Debridement: Improved Post Procedure Diagnosis Same as Pre-procedure Electronic Signature(s) Signed: 12/01/2019 1:45:33 PM By: Linton Ham MD Signed: 12/02/2019 9:08:33 AM By: [name] RN, BSN Previous Signature: 11/07/2019 5:50:16 PM Version By: [name] RN Entered By: [name] on 12/01/2019 12:43:15 -------------------------------------------------------------------------------- HPI Details Patient Name: Date of Service: Erin [name] RLO TTE  A. 10/28/2019 2:30 PM Medical Record Number: [id number] Patient Account Number: [id number] Date of Birth/Sex: Treating RN: June 24, 1930 (86 y.o. Erin [name] Primary Care Provider: Tedra Coupe, A NGELA Other Clinician: Referring Provider: Treating Provider/Extender: Maudie Flakes NE, A NGELA Weeks in Treatment: 28 History of Present Illness HPI Description: We to find a new wound on the left 03/22/17; this is an 86 year old woman. There is not a lot of information in care everywhere on this patient. She had a history of a malignant neoplasm her left breast for which she had lumpectomy but she did not have radiation. Apparently she has had long- standing edema and her lower legs and she was seen in the wound care center at [facility] in [place] by [name] October. Both her legs were wrapped however it sounds as though the left leg became secondarily infected she apparently had MRSA and she was admitted the hospital. Not really turn for wound care. She lives at home on her own and has apparently [facility] home health care. We are not exactly clear what [facility] is doing to her legs but it does not involve compression. She is not a diabetic. ABIs in our clinic were noncompressible bilaterally 03/30/17; this is a patient who has bilateral chronic venous insufficiency, severe venous inflammation. We  had a nice description number legs from a note by [name] dating 2010 describing edematous warm legs with erythema. This suggests that she has chronic venous insufficiency with inflammation.. We have arterial studies dated 12/29/16 again from [facility]. This showed a right ABI at 1 and a TBI of 0.81 on the left the ABI was 1.25 and a TBI of 0.72. This was just she does not have significant arterial insufficiency. She had normal triphasic waveforms noted that she was admitted to hospital in late October with MSSA cellulitis Last visit we wrapped both her legs. She had an open area predominantly on the left anterior but weeping edema on the right anterior leg. She has done well there is only one at anterior left tibial wound 04/06/17; the patient has no open area on her right leg. Her remaining wound is on the left anterior leg, the left posterior leg wound is healed. We have been using silver alginate changed to [facility] on the left leg today she has home health 04/13/17; still no open area on her right leg. Her remaining wound on the left anterior leg. We have been using using Hydrofera Blue 04/20/17; no open area on the right leg although the edema here is somewhat disfiguring. Her remaining wound is on the left anterior leg we've been using Hydrofera Blue with improvement in dimensions. She has [facility] home health and lives in [place]. She requests to come every 2 weeks because of the distance involved in coming here 05/04/17; there is no open area on the right leg. She has a very small left anterior leg wound that remains. However what remains on the left still has some depth and a not to viable looking surface. It is too small to really attempt debridement. She has home health coming out to her home in [facility] 05/18/17; there is no open area on either leg. She has significant chronic venous insufficiency. The left anterior leg wound that was still open 2 weeks ago has closed.  She has home health coming out. We are going to order Juxtalite stockings for both legs. She is agreed to pay for these privately. We will order them from  [facility] READMISSION 04/14/2019 This is a patient we previously had in this clinic in 2019 for 3 months. She had a wound on the left anterior greater than right anterior leg. According to my notes we discharged her and juxta lite stockings although it does not appear that she ever got them. The history here is a bit difficult. She apparently has had a wound on the left lower leg for the last 6 months. She required admission to hospital in [facility] for apparent cellulitis just before Christmas was discharged home she has an open wound. She has a home health agency although we are not exactly sure which one. They are applying silver alginate. No compression Past medical history is reviewed she has chronic venous insufficiency and a history of left breast CA. 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 She did not have any compression on the wounds today. 2/8; substantial wound on the right posterior calf and a smaller area on the left medial calf. We are using Iodoflex to both wound areas under compression. We have better edema control We to find a new wound on the left anterior medial calf 2/15; the area on the left medial calf is closed over. Substantial area on the right posterior and lateral calf. We have been using Iodoflex although the patient complains of pain and wonders if there is not an alternative. She has home health changing the dressing twice a week and she sees [place] once 2/22; the wound on the left medial calf is closed over. Her edema control is not good here but she cannot get on stockings. This substantial wound is on the right lateral and right posterior calf. Changed her to a hydrofera Blue last week. Gradual  improvement in wound area. 3/8; wound on the left leg remains closed. The substantial wound is on the right lateral and right posterior calf. This is measuring smaller 3/22; 2-week follow-up. Substantial area on the right posterior and lateral calf measures slightly smaller. We are using Hydrofera Blue under compression 4/5; 2-week follow-up. Substantial area on the right posterior lateral calf. This measures slightly smaller. She has islands of epithelialization we have been using Hydrofera Blue under compression 4/19; 2-week follow-up. We have been using Hydrofera Blue however there is maceration around the wound. I will change to silver alginate today. She has islands of epithelialization however I did not see much evidence that things were improving here. 5/6; 2-week follow-up. I put silver alginate on this because of maceration around the wound last time. However she comes in today with a lot of very adherent debris on the wound of the right lateral leg. 5/17; 2-week follow-up. Using Hydrofera Blue under compression. The patient has epithelialization including islands of epithelialization in the middle of fairly substantial wound area on the right posterior lateral calf 6/1; 2-week follow-up. Using Hydrofera Blue under compression. She seems to have increasing epithelialization. This is an irregular wound going from medial to lateral posteriorly in the right posterior calf. This would not be easy wound to get reproducible measurement 08/25/2019 upon evaluation today patient appears to actually be doing better in regard to the wound on her leg at this point. We have been using Adaptic followed by [facility] this is keeping her from getting stuck. The patient is aware that this is also potentially slowing down her healing simply due to the  fact that the [facility] not in direct contact with the wound bed. Nonetheless she was not happy with the way it was sticking previous which is why  we switched to this according to what I can read in the note and hear from the patient and her aide with her today. 6/28; 2-week follow-up. We are following this patient for severe wound on the right lower leg secondary to chronic venous insufficiency using Hydrofera Blue under compression we are making gradual improvements. She arrives in clinic today with what looks like a contusion on the left anterior mid tibia. She says she was washing her leg and pulled some skin. However there is a fairly large wound with surrounding erythema and some swelling 7/12; 2-week follow-up. Right lower leg wound not quite as good as I thought. There is still islands of epithelialization we are using Hydrofera Blue with a contact layer I would like to see if we can get rid of the contact layer at this point. The new area from last time is a lot smaller She did not take the cephalexin I initially prescribed for the erythema edema around the left leg due to fears about diarrhea. She did tolerate doxycycline that we subsequently called in 7/26; we are doing 2-week follow-up. Wound measurements are about the same. Still the same fibrinous debris we have been using Hydrofera Blue. Change to Iodoflex today. 8/17; been using Iodoflex. Some improvement in the surface area of the large wound on the right posterior calf left anterior actually slightly larger. 100% [name] covered although it is less adherent Electronic Signature(s) Signed: 10/28/2019 6:57:29 PM By: Linton Ham MD Signed: 11/07/2019 5:50:16 PM By: [name] RN Entered By: [name] on 10/28/2019 16:54:15 -------------------------------------------------------------------------------- Physical Exam Details Patient Name: Date of Service: Erin [name] [name] 10/28/2019 2:30 PM Medical Record Number: [id number] Patient Account Number: [id number] Date of Birth/Sex: Treating RN: 1930-05-17 (86 y.o. Erin [name] Primary Care Provider: Henderson Cloud NE, A  NGELA Other Clinician: Referring Provider: Treating Provider/Extender: Maudie Flakes NE, A NGELA Weeks in Treatment: 28 Notes Wound exam; wound on the right posterior calf is smaller. Still 100% covered in fibrinous necrotic material. Debrided with an open curette hemostasis with direct pressure. Similar surface on the left anterior tibia. Electronic Signature(s) Signed: 10/28/2019 6:57:29 PM By: Linton Ham MD Signed: 11/07/2019 5:50:16 PM By: [name] RN Entered By: [name] on 10/28/2019 16:55:13 -------------------------------------------------------------------------------- Physician Orders Details Patient Name: Date of Service: Erin [name] [name] 10/28/2019 2:30 PM Medical Record Number: [id number] Patient Account Number: [id number] Date of Birth/Sex: Treating RN: [birth year] (86 y.o. Erin [name] Primary Care Provider: Henderson Cloud NE, A NGELA Other Clinician: Referring Provider: Treating Provider/Extender: Maudie Flakes NE, A NGELA Weeks in Treatment: 28 Verbal / Phone Orders: No Diagnosis Coding ICD-10 Coding Code Description I87.331 Chronic venous hypertension (idiopathic) with ulcer and inflammation of right lower extremity L97.812 Non-pressure chronic ulcer of other part of right lower leg with fat layer exposed L97.822 Non-pressure chronic ulcer of other part of left lower leg with fat layer exposed L03.116 Cellulitis of left lower limb I89.0 Lymphedema, not elsewhere classified Follow-up Appointments Return appointment in 3 weeks. Dressing Change Frequency Wound #6 Right Lower Leg Change dressing three times week. - by home health. Home health to change 2x a week on week that patient has appt at wound clinic. Wound #8 Left,Anterior Lower Leg Change dressing three times week. - by home health. Home  health to change 2x a week on week that patient has appt at wound clinic. Skin Barriers/Peri-Wound Care Barrier cream - to excoriated  areas Moisturizing lotion - both legs Wound Cleansing May shower with protection. - use cast protector Primary Wound Dressing Wound #6 Right Lower Leg Iodoflex - or Iodosorb gel Wound #8 Left,Anterior Lower Leg Iodoflex - or Iodosorb gel Secondary Dressing Wound #6 Right Lower Leg ABD pad Zetuvit or Kerramax - or Xtrasorb (or other superabsorbent dressing) Wound #8 Left,Anterior Lower Leg Dry Gauze Edema Control 3 Layer Compression System - Bilateral Avoid standing for long periods of time Elevate legs to the level of the heart or above for 30 minutes daily and/or when sitting, a frequency of: - throughout the day [place] skilled nursing for wound care. - [facility] Electronic Signature(s) Signed: 10/28/2019 6:57:29 PM By: Linton Ham MD Signed: 11/07/2019 5:50:16 PM By: [name] RN Entered By: [name] on 10/28/2019 16:55:37 -------------------------------------------------------------------------------- Problem List Details Patient Name: Date of Service: Erin [name] [name] 10/28/2019 2:30 PM Medical Record Number: [id number] Patient Account Number: [id number] Date of Birth/Sex: Treating RN: 10/31/1930 (86 y.o. Erin [name] Primary Care Provider: Henderson Cloud NE, A NGELA Other Clinician: Referring Provider: Treating Provider/Extender: Maudie Flakes NE, A NGELA Weeks in Treatment: 28 Active Problems ICD-10 Encounter Code Description Active Date MDM Diagnosis I87.331 Chronic venous hypertension (idiopathic) with ulcer and inflammation of right 04/14/2019 No Yes lower extremity L97.812 Non-pressure chronic ulcer of other part of right lower leg with fat layer 04/14/2019 No Yes exposed L97.822 Non-pressure chronic ulcer of other part of left lower leg with fat layer exposed6/28/2021 No Yes L03.116 Cellulitis of left lower limb 09/08/2019 No Yes I89.0 Lymphedema, not elsewhere classified 04/14/2019 No Yes Inactive Problems ICD-10 Code  Description Active Date Inactive Date L97.821 Non-pressure chronic ulcer of other part of left lower leg limited to breakdown of skin 04/21/2019 04/21/2019 Resolved Problems Electronic Signature(s) Signed: 10/28/2019 6:57:29 PM By: Linton Ham MD Signed: 11/07/2019 5:50:16 PM By: [name] RN Entered By: [name] on 10/28/2019 16:52:57 -------------------------------------------------------------------------------- Progress Note Details Patient Name: Date of Service: Erin [name] [name] 10/28/2019 2:30 PM Medical Record Number: [id number] Patient Account Number: [id number] Date of Birth/Sex: Treating RN: Apr 02, 1930 (86 y.o. Erin [name] Primary Care Provider: Henderson Cloud NE, A NGELA Other Clinician: Referring Provider: Treating Provider/Extender: Maudie Flakes NE, A NGELA Weeks in Treatment: 28 Subjective History of Present Illness (HPI) We to find a new wound on the left 03/22/17; this is an 86 year old woman. There is not a lot of information in care everywhere on this patient. She had a history of a malignant neoplasm her left breast for which she had lumpectomy but she did not have radiation. Apparently she has had long-standing edema and her lower legs and she was seen in the wound care center at [facility] in [place] by [name] October. Both her legs were wrapped however it sounds as though the left leg became secondarily infected she apparently had MRSA and she was admitted the hospital. Not really turn for wound care. She lives at home on her own and has apparently [facility] home health care. We are not exactly clear what [facility] is doing to her legs but it does not involve compression. She is not a diabetic. ABIs in our clinic were noncompressible bilaterally 03/30/17; this is a patient who has bilateral chronic venous insufficiency, severe venous inflammation. We had a nice  description number legs from a note by [name] dating 2010 describing edematous warm  legs with erythema. This suggests that she has chronic venous insufficiency with inflammation.. We have arterial studies dated 12/29/16 again from [facility]. This showed a right ABI at 1 and a TBI of 0.81 on the left the ABI was 1.25 and a TBI of 0.72. This was just she does not have significant arterial insufficiency. She had normal triphasic waveforms noted that she was admitted to hospital in late October with MSSA cellulitis Last visit we wrapped both her legs. She had an open area predominantly on the left anterior but weeping edema on the right anterior leg. She has done well there is only one at anterior left tibial wound 04/06/17; the patient has no open area on her right leg. Her remaining wound is on the left anterior leg, the left posterior leg wound is healed. We have been using silver alginate changed to [facility] on the left leg today she has home health 04/13/17; still no open area on her right leg. Her remaining wound on the left anterior leg. We have been using using Hydrofera Blue 04/20/17; no open area on the right leg although the edema here is somewhat disfiguring. Her remaining wound is on the left anterior leg we've been using Hydrofera Blue with improvement in dimensions. She has [facility] home health and lives in [place]. She requests to come every 2 weeks because of the distance involved in coming here 05/04/17; there is no open area on the right leg. She has a very small left anterior leg wound that remains. However what remains on the left still has some depth and a not to viable looking surface. It is too small to really attempt debridement. She has home health coming out to her home in [facility] 05/18/17; there is no open area on either leg. She has significant chronic venous insufficiency. The left anterior leg wound that was still open 2 weeks ago has closed. She has home health coming out. We are going to order Juxtalite stockings for both legs. She is  agreed to pay for these privately. We will order them from [facility] READMISSION 04/14/2019 This is a patient we previously had in this clinic in 2019 for 3 months. She had a wound on the left anterior greater than right anterior leg. According to my notes we discharged her and juxta lite stockings although it does not appear that she ever got them. The history here is a bit difficult. She apparently has had a wound on the left lower leg for the last 6 months. She required admission to hospital in [facility] for apparent cellulitis just before Christmas was discharged home she has an open wound. She has a home health agency although we are not exactly sure which one. They are applying silver alginate. No compression Past medical history is reviewed she has chronic venous insufficiency and a history of left breast CA. 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 She did not have any compression on the wounds today. 2/8; substantial wound on the right posterior calf and a smaller area on the left medial calf. We are using Iodoflex to both wound areas under compression. We have better edema control We to find a new wound on the left anterior medial calf 2/15; the area on the left medial  calf is closed over. Substantial area on the right posterior and lateral calf. We have been using Iodoflex although the patient complains of pain and wonders if there is not an alternative. She has home health changing the dressing twice a week and she sees [place] once 2/22; the wound on the left medial calf is closed over. Her edema control is not good here but she cannot get on stockings. This substantial wound is on the right lateral and right posterior calf. Changed her to a hydrofera Blue last week. Gradual improvement in wound area. 3/8; wound on the left leg remains closed. The substantial wound is  on the right lateral and right posterior calf. This is measuring smaller 3/22; 2-week follow-up. Substantial area on the right posterior and lateral calf measures slightly smaller. We are using Hydrofera Blue under compression 4/5; 2-week follow-up. Substantial area on the right posterior lateral calf. This measures slightly smaller. She has islands of epithelialization we have been using Hydrofera Blue under compression 4/19; 2-week follow-up. We have been using Hydrofera Blue however there is maceration around the wound. I will change to silver alginate today. She has islands of epithelialization however I did not see much evidence that things were improving here. 5/6; 2-week follow-up. I put silver alginate on this because of maceration around the wound last time. However she comes in today with a lot of very adherent debris on the wound of the right lateral leg. 5/17; 2-week follow-up. Using Hydrofera Blue under compression. The patient has epithelialization including islands of epithelialization in the middle of fairly substantial wound area on the right posterior lateral calf 6/1; 2-week follow-up. Using Hydrofera Blue under compression. She seems to have increasing epithelialization. This is an irregular wound going from medial to lateral posteriorly in the right posterior calf. This would not be easy wound to get reproducible measurement 08/25/2019 upon evaluation today patient appears to actually be doing better in regard to the wound on her leg at this point. We have been using Adaptic followed by [facility] this is keeping her from getting stuck. The patient is aware that this is also potentially slowing down her healing simply due to the fact that the [facility] not in direct contact with the wound bed. Nonetheless she was not happy with the way it was sticking previous which is why we switched to this according to what I can read in the note and hear from the patient and her aide  with her today. 6/28; 2-week follow-up. We are following this patient for severe wound on the right lower leg secondary to chronic venous insufficiency using Hydrofera Blue under compression we are making gradual improvements. She arrives in clinic today with what looks like a contusion on the left anterior mid tibia. She says she was washing her leg and pulled some skin. However there is a fairly large wound with surrounding erythema and some swelling 7/12; 2-week follow-up. Right lower leg wound not quite as good as I thought. There is still islands of epithelialization we are using Hydrofera Blue with a contact layer I would like to see if we can get rid of the contact layer at this point. ooThe new area from last time is a lot smaller ooShe did not take the cephalexin I initially prescribed for the erythema edema around the left leg due to fears about diarrhea. She did tolerate doxycycline that we subsequently called in 7/26; we are doing 2-week follow-up. Wound measurements are about the same. Still the same  fibrinous debris we have been using Hydrofera Blue. Change to Iodoflex today. 8/17; been using Iodoflex. Some improvement in the surface area of the large wound on the right posterior calf left anterior actually slightly larger. 100% [name] covered although it is less adherent Objective Constitutional Vitals Time Taken: 2:36 PM, Height: 60 in, Source: Stated, Weight: 130 lbs, Source: Stated, BMI: 25.4, Temperature: 98.4 F, Pulse: 75 bpm, Respiratory Rate: 18 breaths/min, Blood Pressure: 177/73 mmHg. Integumentary (Hair, Skin) Wound #6 status is Open. Original cause of wound was Gradually Appeared. The wound is located on the Right Lower Leg. The wound measures 6.5cm length x 11.8cm width x 0.2cm depth; 60.24cm^2 area and 12.048cm^3 volume. There is Fat Layer (Subcutaneous Tissue) Exposed exposed. There is no tunneling or undermining noted. There is a large amount of serosanguineous  drainage noted. The wound margin is distinct with the outline attached to the wound base. There is small (1-33%) pink granulation within the wound bed. There is a large (67-100%) amount of necrotic tissue within the wound bed including Adherent [name]. Wound #8 status is Open. Original cause of wound was Trauma. The wound is located on the Left,Anterior Lower Leg. The wound measures 1.3cm length x 2.4cm width x 0.1cm depth; 2.45cm^2 area and 0.245cm^3 volume. There is Fat Layer (Subcutaneous Tissue) Exposed exposed. There is no tunneling or undermining noted. There is a small amount of serosanguineous drainage noted. The wound margin is distinct with the outline attached to the wound base. There is medium (34-66%) red granulation within the wound bed. There is a medium (34-66%) amount of necrotic tissue within the wound bed including Adherent [name]. Assessment Active Problems ICD-10 Chronic venous hypertension (idiopathic) with ulcer and inflammation of right lower extremity Non-pressure chronic ulcer of other part of right lower leg with fat layer exposed Non-pressure chronic ulcer of other part of left lower leg with fat layer exposed Cellulitis of left lower limb Lymphedema, not elsewhere classified Procedures Wound #6 Pre-procedure diagnosis of Wound #6 is a Lymphedema located on the Right Lower Leg . There was a Excisional Skin/Subcutaneous Tissue Debridement with a total area of 76.7 sq cm performed by Tobi Bastos, MD. With the following instrument(s): Curette to remove Viable and Non-Viable tissue/material. Material removed includes Subcutaneous Tissue, [name], Skin: Dermis, and Skin: Epidermis after achieving pain control using Lidocaine 5% topical ointment. No specimens were taken. A time out was conducted at 15:53, prior to the start of the procedure. A Moderate amount of bleeding was controlled with Pressure. The procedure was tolerated well with a pain level of 0 throughout and  a pain level of 0 following the procedure. Post Debridement Measurements: 6.5cm length x 11.8cm width x 0.2cm depth; 12.048cm^3 volume. Character of Wound/Ulcer Post Debridement is improved. Post procedure Diagnosis Wound #6: Same as Pre-Procedure Wound #8 Pre-procedure diagnosis of Wound #8 is a Trauma, Other located on the Left,Anterior Lower Leg . There was a Excisional Skin/Subcutaneous Tissue Debridement with a total area of 3.12 sq cm performed by Tobi Bastos, MD. With the following instrument(s): Curette to remove Viable and Non-Viable tissue/material. Material removed includes Subcutaneous Tissue, Skin: Dermis, and Skin: Epidermis after achieving pain control using Lidocaine 5% topical ointment. No specimens were taken. A time out was conducted at 15:53, prior to the start of the procedure. A Moderate amount of bleeding was controlled with Pressure. The procedure was tolerated well with a pain level of 0 throughout and a pain level of 0 following the procedure. Post Debridement Measurements: 1.3cm length  x 2.4cm width x 0.1cm depth; 0.245cm^3 volume. Character of Wound/Ulcer Post Debridement is improved. Post procedure Diagnosis Wound #8: Same as Pre-Procedure Plan Follow-up Appointments: Return appointment in 3 weeks. Dressing Change Frequency: Wound #6 Right Lower Leg: Change dressing three times week. - by home health. Home health to change 2x a week on week that patient has appt at wound clinic. Wound #8 Left,Anterior Lower Leg: Change dressing three times week. - by home health. Home health to change 2x a week on week that patient has appt at wound clinic. Skin Barriers/Peri-Wound Care: Barrier cream - to excoriated areas Moisturizing lotion - both legs Wound Cleansing: May shower with protection. - use cast protector Primary Wound Dressing: Wound #6 Right Lower Leg: Iodoflex - or Iodosorb gel Wound #8 Left,Anterior Lower Leg: Iodoflex - or Iodosorb gel Secondary  Dressing: Wound #6 Right Lower Leg: ABD pad Zetuvit or Kerramax - or Xtrasorb (or other superabsorbent dressing) Wound #8 Left,Anterior Lower Leg: Dry Gauze Edema Control: 3 Layer Compression System - Bilateral Avoid standing for long periods of time Elevate legs to the level of the heart or above for 30 minutes daily and/or when sitting, a frequency of: - throughout the day Home Health: [facility] for wound care. - [facility] 1. I am continuing with Iodoflex to both wound areas 2. This was initially soft tissue infection on the right in the setting of significant venous stasis and lymphedema. 3. She wants to come in in 3-week intervals she has home health changing the dressing 3 times a week. Careful attention to the wound surface next time Electronic Signature(s) Signed: 10/28/2019 6:57:29 PM By: Linton Ham MD Signed: 11/07/2019 5:50:16 PM By: [name] RN Entered By: [name] on 10/28/2019 16:55:54 -------------------------------------------------------------------------------- SuperBill Details Patient Name: Date of Service: Erin [name], Erin [name] [name] 10/28/2019 Medical Record Number: [id number] Patient Account Number: [id number] Date of Birth/Sex: Treating RN: [birth year] (86 y.o. Erin [name] Primary Care Provider: Henderson Cloud NE, A NGELA Other Clinician: Referring Provider: Treating Provider/Extender: Maudie Flakes NE, A NGELA Weeks in Treatment: 28 Diagnosis Coding ICD-10 Codes Code Description I87.331 Chronic venous hypertension (idiopathic) with ulcer and inflammation of right lower extremity L97.812 Non-pressure chronic ulcer of other part of right lower leg with fat layer exposed L97.822 Non-pressure chronic ulcer of other part of left lower leg with fat layer exposed L03.116 Cellulitis of left lower limb I89.0 Lymphedema, not elsewhere classified Facility Procedures CPT4 Code: 37106269 Description: 48546 - DEB SUBQ TISSUE 20 SQ CM/<  ICD-10 Diagnosis Description L97.812 Non-pressure chronic ulcer of other part of right lower leg with fat layer expo L97.822 Non-pressure chronic ulcer of other part of left lower leg with fat layer expos Modifier: sed ed Quantity: 1 CPT4 Code: [id number] Description: 38182 - DEB SUBQ TISS EA ADDL 20CM ICD-10 Diagnosis Description X93.716 Non-pressure chronic ulcer of other part of right lower leg with fat layer expo L97.822 Non-pressure chronic ulcer of other part of left lower leg with fat layer expos Modifier: sed ed Quantity: 3 Physician Procedures : CPT4 Code Description Modifier [id number] 11042 - WC PHYS SUBQ TISS 20 SQ CM ICD-10 Diagnosis Description B01.751 Non-pressure chronic ulcer of other part of right lower leg with fat layer exposed L97.822 Non-pressure chronic ulcer of other part of left  lower leg with fat layer exposed Quantity: 1 : [id number] 11045 - WC PHYS SUBQ TISS EA ADDL 20 CM ICD-10 Diagnosis Description L97.812 Non-pressure chronic ulcer of other part of right  lower leg with fat layer exposed L97.822 Non-pressure chronic ulcer of other part of left lower leg with fat layer  exposed Quantity: 3 Electronic Signature(s) Signed: 10/28/2019 6:57:29 PM By: Linton Ham MD Signed: 11/07/2019 5:50:16 PM By: [name] RN Entered By: [name] on 10/28/2019 16:56:05

## 2019-12-09 ENCOUNTER — Encounter (HOSPITAL_BASED_OUTPATIENT_CLINIC_OR_DEPARTMENT_OTHER): Payer: Medicare HMO | Admitting: Internal Medicine

## 2020-01-14 ENCOUNTER — Encounter (HOSPITAL_BASED_OUTPATIENT_CLINIC_OR_DEPARTMENT_OTHER): Payer: Medicare HMO | Attending: Physician Assistant | Admitting: Physician Assistant

## 2020-01-14 ENCOUNTER — Other Ambulatory Visit: Payer: Self-pay

## 2020-01-14 DIAGNOSIS — Z8616 Personal history of COVID-19: Secondary | ICD-10-CM | POA: Insufficient documentation

## 2020-01-14 DIAGNOSIS — L03116 Cellulitis of left lower limb: Secondary | ICD-10-CM | POA: Diagnosis not present

## 2020-01-14 DIAGNOSIS — L97822 Non-pressure chronic ulcer of other part of left lower leg with fat layer exposed: Secondary | ICD-10-CM | POA: Insufficient documentation

## 2020-01-14 DIAGNOSIS — I890 Lymphedema, not elsewhere classified: Secondary | ICD-10-CM | POA: Diagnosis not present

## 2020-01-14 DIAGNOSIS — I87331 Chronic venous hypertension (idiopathic) with ulcer and inflammation of right lower extremity: Secondary | ICD-10-CM | POA: Diagnosis present

## 2020-01-14 DIAGNOSIS — L97812 Non-pressure chronic ulcer of other part of right lower leg with fat layer exposed: Secondary | ICD-10-CM | POA: Insufficient documentation

## 2020-01-14 NOTE — Progress Notes (Addendum)
[name], [name] ([id number]) Visit Report for 01/14/2020 Chief Complaint Document Details Patient Name: Date of Service: Erin [name] [name] 01/14/2020 2:45 PM Medical Record Number: [id number] Patient Account Number: [id number] Date of Birth/Sex: Treating RN: 18-Nov-1930 (86 y.o. Erin [name] Primary Care Provider: Arsenio [name] Other Clinician: Referring Provider: Treating Provider/Extender: Erin [name] in Treatment: 39 Information Obtained from: Patient Chief Complaint 03/22/17; patient is here for review of wounds on her bilateral legs Electronic Signature(s) Signed: 01/14/2020 2:42:43 PM By: Erin Keeler PA-C Entered By: Erin [name] on 01/14/2020 14:42:42 -------------------------------------------------------------------------------- HPI Details Patient Name: Date of Service: Erin [name] [name] 01/14/2020 2:45 PM Medical Record Number: [id number] Patient Account Number: [id number] Date of Birth/Sex: Treating RN: 1930/09/07 (86 y.o. Erin [name] Primary Care Provider: Arsenio [name] Other Clinician: Referring Provider: Treating Provider/Extender: Erin [name] in Treatment: 39 History of Present Illness HPI Description: We to find a new wound on the left 03/22/17; this is an 86 year old woman. There is not a lot of information in care everywhere on this patient. She had a history of a malignant neoplasm her left breast for which she had lumpectomy but she did not have radiation. Apparently she has had long- standing edema and her lower legs and she was seen in the wound care center at [facility] in [place] by Dr. Nils [name] October. Both her legs were wrapped however it sounds as though the left leg became secondarily infected she apparently had MRSA and she was admitted the hospital. Not really turn for wound care. She lives at home on her own and has apparently [facility] home health care. We are not  exactly clear what [facility] is doing to her legs but it does not involve compression. She is not a diabetic. ABIs in our clinic were noncompressible bilaterally 03/30/17; this is a patient who has bilateral chronic venous insufficiency, severe venous inflammation. We had a nice description number legs from a note by Dr. Thurnell [name] dating 2010 describing edematous warm legs with erythema. This suggests that she has chronic venous insufficiency with inflammation.. We have arterial studies dated 12/29/16 again from [facility]. This showed a right ABI at 1 and a TBI of 0.81 on the left the ABI was 1.25 and a TBI of 0.72. This was just she does not have significant arterial insufficiency. She had normal triphasic waveforms noted that she was admitted to hospital in late October with MSSA cellulitis Last visit we wrapped both her legs. She had an open area predominantly on the left anterior but weeping edema on the right anterior leg. She has done well there is only one at anterior left tibial wound 04/06/17; the patient has no open area on her right leg. Her remaining wound is on the left anterior leg, the left posterior leg wound is healed. We have been using silver alginate changed to [facility] on the left leg today she has home health 04/13/17; still no open area on her right leg. Her remaining wound on the left anterior leg. We have been using using Hydrofera Blue 04/20/17; no open area on the right leg although the edema here is somewhat disfiguring. Her remaining wound is on the left anterior leg we've been using Hydrofera Blue with improvement in dimensions. She has [facility] home health and lives in [place]. She requests to come every 2 weeks because of the distance involved in coming here 05/04/17; there is no open area  on the right leg. She has a very small left anterior leg wound that remains. However what remains on the left still has some depth and a not to viable looking  surface. It is too small to really attempt debridement. She has home health coming out to her home in [facility] 05/18/17; there is no open area on either leg. She has significant chronic venous insufficiency. The left anterior leg wound that was still open 2 weeks ago has closed. She has home health coming out. We are going to order Juxtalite stockings for both legs. She is agreed to pay for these privately. We will order them from [facility] READMISSION 04/14/2019 This is a patient we previously had in this clinic in 2019 for 3 months. She had a wound on the left anterior greater than right anterior leg. According to my notes we discharged her and juxta lite stockings although it does not appear that she ever got them. The history here is a bit difficult. She apparently has had a wound on the left lower leg for the last 6 months. She required admission to hospital in [facility] for apparent cellulitis just before Christmas was discharged home she has an open wound. She has a home health agency although we are not exactly sure which one. They are applying silver alginate. No compression Past medical history is reviewed she has chronic venous insufficiency and a history of left breast CA. 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 She did not have any compression on the wounds today. 2/8; substantial wound on the right posterior calf and a smaller area on the left medial calf. We are using Iodoflex to both wound areas under compression. We have better edema control We to find a new wound on the left anterior medial calf 2/15; the area on the left medial calf is closed over. Substantial area on the right posterior and lateral calf. We have been using Iodoflex although the patient complains of pain and wonders if there is not an alternative. She has home health changing the dressing twice a  week and she sees [place] once 2/22; the wound on the left medial calf is closed over. Her edema control is not good here but she cannot get on stockings. This substantial wound is on the right lateral and right posterior calf. Changed her to a hydrofera Blue last week. Gradual improvement in wound area. 3/8; wound on the left leg remains closed. The substantial wound is on the right lateral and right posterior calf. This is measuring smaller 3/22; 2-week follow-up. Substantial area on the right posterior and lateral calf measures slightly smaller. We are using Hydrofera Blue under compression 4/5; 2-week follow-up. Substantial area on the right posterior lateral calf. This measures slightly smaller. She has islands of epithelialization we have been using Hydrofera Blue under compression 4/19; 2-week follow-up. We have been using Hydrofera Blue however there is maceration around the wound. I will change to silver alginate today. She has islands of epithelialization however I did not see much evidence that things were improving here. 5/6; 2-week follow-up. I put silver alginate on this because of maceration around the wound last time. However she comes in today with a lot of very adherent debris on the wound of the right lateral leg. 5/17; 2-week follow-up. Using Hydrofera Blue under compression. The patient has epithelialization including islands of  epithelialization in the middle of fairly substantial wound area on the right posterior lateral calf 6/1; 2-week follow-up. Using Hydrofera Blue under compression. She seems to have increasing epithelialization. This is an irregular wound going from medial to lateral posteriorly in the right posterior calf. This would not be easy wound to get reproducible measurement 08/25/2019 upon evaluation today patient appears to actually be doing better in regard to the wound on her leg at this point. We have been using Adaptic followed by [facility] this is keeping  her from getting stuck. The patient is aware that this is also potentially slowing down her healing simply due to the fact that the [facility] not in direct contact with the wound bed. Nonetheless she was not happy with the way it was sticking previous which is why we switched to this according to what I can read in the note and hear from the patient and her aide with her today. 6/28; 2-week follow-up. We are following this patient for severe wound on the right lower leg secondary to chronic venous insufficiency using Hydrofera Blue under compression we are making gradual improvements. She arrives in clinic today with what looks like a contusion on the left anterior mid tibia. She says she was washing her leg and pulled some skin. However there is a fairly large wound with surrounding erythema and some swelling 7/12; 2-week follow-up. Right lower leg wound not quite as good as I thought. There is still islands of epithelialization we are using Hydrofera Blue with a contact layer I would like to see if we can get rid of the contact layer at this point. The new area from last time is a lot smaller She did not take the cephalexin I initially prescribed for the erythema edema around the left leg due to fears about diarrhea. She did tolerate doxycycline that we subsequently called in 7/26; we are doing 2-week follow-up. Wound measurements are about the same. Still the same fibrinous debris we have been using Hydrofera Blue. Change to Iodoflex today. 8/17; been using Iodoflex. Some improvement in the surface area of the large wound on the right posterior calf left anterior actually slightly larger. 100% [name] covered although it is less adherent 9/seven 3-week follow-up. Been using Iodoflex. We still have continued improvement in the surface area of the large wound on the right calf. The small area on the left anteriorly still is not closed. 01/14/2020 on evaluation today patient appears to be doing  decently well in regard to her leg ulcer. Fortunately there is no signs of active infection at this time. With that being said the wound on the right leg laterally is still open and given her trouble. Fortunately there is no signs of active infection at this time. No fevers, chills, nausea, vomiting, or diarrhea. Electronic Signature(s) Signed: 01/14/2020 3:36:36 PM By: Erin Keeler PA-C Entered By: Erin [name] on 01/14/2020 15:36:35 -------------------------------------------------------------------------------- Physical Exam Details Patient Name: Date of Service: Erin [name] [name] 01/14/2020 2:45 PM Medical Record Number: [id number] Patient Account Number: [id number] Date of Birth/Sex: Treating RN: August 30, 1930 (86 y.o. Erin [name] Primary Care Provider: Arsenio [name] Other Clinician: Referring Provider: Treating Provider/Extender: Erin [name] in Treatment: 10 Constitutional Well-nourished and well-hydrated in no acute distress. Respiratory normal breathing without difficulty. Psychiatric this patient is able to make decisions and demonstrates good insight into disease process. Alert and Oriented x 3. pleasant and cooperative. Notes Upon inspection patient's wound bed actually showed  signs of good granulation at this time there does not appear to be any evidence of infection there was minimal [name] noted and overall the Iodoflex seems to be doing well for her. She has not been back to see [place] more recently simply due to the fact that unfortunately she contracted Covid and was not able to come in to be seen. Electronic Signature(s) Signed: 01/14/2020 3:37:12 PM By: Erin Keeler PA-C Entered By: Erin [name] on 01/14/2020 15:37:11 -------------------------------------------------------------------------------- Physician Orders Details Patient Name: Date of Service: Erin [name] [name] 01/14/2020 2:45 PM Medical Record Number:  [id number] Patient Account Number: [id number] Date of Birth/Sex: Treating RN: 11-Jan-1931 (86 y.o. [name], [name] Primary Care Provider: Arsenio [name] Other Clinician: Referring Provider: Treating Provider/Extender: Erin [name] in Treatment: 75 Verbal / Phone Orders: No Diagnosis Coding ICD-10 Coding Code Description I87.331 Chronic venous hypertension (idiopathic) with ulcer and inflammation of right lower extremity L97.812 Non-pressure chronic ulcer of other part of right lower leg with fat layer exposed L97.822 Non-pressure chronic ulcer of other part of left lower leg with fat layer exposed L03.116 Cellulitis of left lower limb I89.0 Lymphedema, not elsewhere classified Follow-up Appointments Return Appointment in 2 weeks. Dressing Change Frequency Wound #6 Right Lower Leg Change dressing three times week. - by home health. Home health to change 2x a week on week that patient has appt at wound clinic. Skin Barriers/Peri-Wound Care Barrier cream - to excoriated areas Moisturizing lotion - both legs Wound Cleansing May shower with protection. - use cast protector Primary Wound Dressing Wound #6 Right Lower Leg Iodoflex - or Iodosorb gel Secondary Dressing Wound #6 Right Lower Leg ABD pad Edema Control 3 Layer Compression System - Right Lower Extremity Avoid standing for long periods of time Elevate legs to the level of the heart or above for 30 minutes daily and/or when sitting, a frequency of: - throughout the day whenever sitting Exercise regularly [facility] skilled [facility] for wound care. - [facility] Electronic Signature(s) Signed: 01/14/2020 5:20:58 PM By: Erin Keeler PA-C Signed: 01/14/2020 5:51:45 PM By: [name] RN, BSN Entered By: [name] on 01/14/2020 15:35:19 -------------------------------------------------------------------------------- Problem List Details Patient Name: Date of  Service: Erin [name] [name] 01/14/2020 2:45 PM Medical Record Number: [id number] Patient Account Number: [id number] Date of Birth/Sex: Treating RN: [birth year] (86 y.o. [name], [name] Primary Care Provider: Arsenio [name] Other Clinician: Referring Provider: Treating Provider/Extender: Erin [name] in Treatment: 39 Active Problems ICD-10 Encounter Code Description Active Date MDM Diagnosis I87.331 Chronic venous hypertension (idiopathic) with ulcer and inflammation of right 04/14/2019 No Yes lower extremity L97.812 Non-pressure chronic ulcer of other part of right lower leg with fat layer 04/14/2019 No Yes exposed L97.822 Non-pressure chronic ulcer of other part of left lower leg with fat layer exposed6/28/2021 No Yes L03.116 Cellulitis of left lower limb 09/08/2019 No Yes I89.0 Lymphedema, not elsewhere classified 04/14/2019 No Yes Inactive Problems ICD-10 Code Description Active Date Inactive Date L97.821 Non-pressure chronic ulcer of other part of left lower leg limited to breakdown of skin 04/21/2019 04/21/2019 Resolved Problems Electronic Signature(s) Signed: 01/14/2020 2:42:24 PM By: Erin Keeler PA-C Entered By: Erin [name] on 01/14/2020 14:42:24 -------------------------------------------------------------------------------- Progress Note Details Patient Name: Date of Service: Erin [name] [name] 01/14/2020 2:45 PM Medical Record Number: [id number] Patient Account Number: [id number] Date of Birth/Sex: Treating RN: [birth year] (86 y.o. Erin [name] Primary Care Provider: Cyndi Bender,  [name] Other Clinician: Referring Provider: Treating Provider/Extender: Erin [name] in Treatment: 70 Subjective Chief Complaint Information obtained from Patient 03/22/17; patient is here for review of wounds on her bilateral legs History of Present Illness (HPI) We to find a new wound on the left 03/22/17; this is an 86 year old woman.  There is not a lot of information in care everywhere on this patient. She had a history of a malignant neoplasm her left breast for which she had lumpectomy but she did not have radiation. Apparently she has had long-standing edema and her lower legs and she was seen in the wound care center at [facility] in [place] by Dr. Nils [name] October. Both her legs were wrapped however it sounds as though the left leg became secondarily infected she apparently had MRSA and she was admitted the hospital. Not really turn for wound care. She lives at home on her own and has apparently [facility] home health care. We are not exactly clear what [facility] is doing to her legs but it does not involve compression. She is not a diabetic. ABIs in our clinic were noncompressible bilaterally 03/30/17; this is a patient who has bilateral chronic venous insufficiency, severe venous inflammation. We had a nice description number legs from a note by Dr. Thurnell [name] dating 2010 describing edematous warm legs with erythema. This suggests that she has chronic venous insufficiency with inflammation.. We have arterial studies dated 12/29/16 again from [facility]. This showed a right ABI at 1 and a TBI of 0.81 on the left the ABI was 1.25 and a TBI of 0.72. This was just she does not have significant arterial insufficiency. She had normal triphasic waveforms noted that she was admitted to hospital in late October with MSSA cellulitis Last visit we wrapped both her legs. She had an open area predominantly on the left anterior but weeping edema on the right anterior leg. She has done well there is only one at anterior left tibial wound 04/06/17; the patient has no open area on her right leg. Her remaining wound is on the left anterior leg, the left posterior leg wound is healed. We have been using silver alginate changed to [facility] on the left leg today she has home health 04/13/17; still no open area on her right leg. Her  remaining wound on the left anterior leg. We have been using using Hydrofera Blue 04/20/17; no open area on the right leg although the edema here is somewhat disfiguring. Her remaining wound is on the left anterior leg we've been using Hydrofera Blue with improvement in dimensions. She has [facility] home health and lives in [place]. She requests to come every 2 weeks because of the distance involved in coming here 05/04/17; there is no open area on the right leg. She has a very small left anterior leg wound that remains. However what remains on the left still has some depth and a not to viable looking surface. It is too small to really attempt debridement. She has home health coming out to her home in [facility] 05/18/17; there is no open area on either leg. She has significant chronic venous insufficiency. The left anterior leg wound that was still open 2 weeks ago has closed. She has home health coming out. We are going to order Juxtalite stockings for both legs. She is agreed to pay for these privately. We will order them from [facility] READMISSION 04/14/2019 This is a patient we previously had in this clinic in 2019  for 3 months. She had a wound on the left anterior greater than right anterior leg. According to my notes we discharged her and juxta lite stockings although it does not appear that she ever got them. The history here is a bit difficult. She apparently has had a wound on the left lower leg for the last 6 months. She required admission to hospital in [facility] for apparent cellulitis just before Christmas was discharged home she has an open wound. She has a home health agency although we are not exactly sure which one. They are applying silver alginate. No compression Past medical history is reviewed she has chronic venous insufficiency and a history of left breast CA. She also has a history of MRSA in the wound ABIs were not done in the clinic today however she did have noninvasive arterial  studies in 2018 which are actually quite good. She did not have any compression on the wounds today. 2/8; substantial wound on the right posterior calf and a smaller area on the left medial calf. We are using Iodoflex to both wound areas under compression. We have better edema control We to find a new wound on the left anterior medial calf 2/15; the area on the left medial calf is closed over. Substantial area on the right posterior and lateral calf. We have been using Iodoflex although the patient complains of pain and wonders if there is not an alternative. She has home health changing the dressing twice a week and she sees [place] once 2/22; the wound on the left medial calf is closed over. Her edema control is not good here but she cannot get on stockings. This substantial wound is on the right lateral and right posterior calf. Changed her to a hydrofera Blue last week. Gradual improvement in wound area. 3/8; wound on the left leg remains closed. The substantial wound is on the right lateral and right posterior calf. This is measuring smaller 3/22; 2-week follow-up. Substantial area on the right posterior and lateral calf measures slightly smaller. We are using Hydrofera Blue under compression 4/5; 2-week follow-up. Substantial area on the right posterior lateral calf. This measures slightly smaller. She has islands of epithelialization we have been using Hydrofera Blue under compression 4/19; 2-week follow-up. We have been using Hydrofera Blue however there is maceration around the wound. I will change to silver alginate today. She has islands of epithelialization however I did not see much evidence that things were improving here. 5/6; 2-week follow-up. I put silver alginate on this because of maceration around the wound last time. However she comes in today with a lot of very adherent debris on the wound of the right lateral leg. 5/17; 2-week follow-up. Using Hydrofera Blue under compression. The  patient has epithelialization including islands of epithelialization in the middle of fairly substantial wound area on the right posterior lateral calf 6/1; 2-week follow-up. Using Hydrofera Blue under compression. She seems to have increasing epithelialization. This is an irregular wound going from medial to lateral posteriorly in the right posterior calf. This would not be easy wound to get reproducible measurement 08/25/2019 upon evaluation today patient appears to actually be doing better in regard to the wound on her leg at this point. We have been using Adaptic followed by [facility] this is keeping her from getting stuck. The patient is aware that this is also potentially slowing down her healing simply due to the fact that the [facility] not in direct contact with the wound bed. Nonetheless  she was not happy with the way it was sticking previous which is why we switched to this according to what I can read in the note and hear from the patient and her aide with her today. 6/28; 2-week follow-up. We are following this patient for severe wound on the right lower leg secondary to chronic venous insufficiency using Hydrofera Blue under compression we are making gradual improvements. She arrives in clinic today with what looks like a contusion on the left anterior mid tibia. She says she was washing her leg and pulled some skin. However there is a fairly large wound with surrounding erythema and some swelling 7/12; 2-week follow-up. Right lower leg wound not quite as good as I thought. There is still islands of epithelialization we are using Hydrofera Blue with a contact layer I would like to see if we can get rid of the contact layer at this point. ooThe new area from last time is a lot smaller ooShe did not take the cephalexin I initially prescribed for the erythema edema around the left leg due to fears about diarrhea. She did tolerate doxycycline that we subsequently called in 7/26;  we are doing 2-week follow-up. Wound measurements are about the same. Still the same fibrinous debris we have been using Hydrofera Blue. Change to Iodoflex today. 8/17; been using Iodoflex. Some improvement in the surface area of the large wound on the right posterior calf left anterior actually slightly larger. 100% [name] covered although it is less adherent 9/seven 3-week follow-up. Been using Iodoflex. We still have continued improvement in the surface area of the large wound on the right calf. The small area on the left anteriorly still is not closed. 01/14/2020 on evaluation today patient appears to be doing decently well in regard to her leg ulcer. Fortunately there is no signs of active infection at this time. With that being said the wound on the right leg laterally is still open and given her trouble. Fortunately there is no signs of active infection at this time. No fevers, chills, nausea, vomiting, or diarrhea. Objective Constitutional Well-nourished and well-hydrated in no acute distress. Vitals Time Taken: 2:50 PM, Height: 60 in, Weight: 130 lbs, BMI: 25.4, Temperature: 98.2 F, Pulse: 65 bpm, Respiratory Rate: 18 breaths/min, Blood Pressure: 164/85 mmHg. Respiratory normal breathing without difficulty. Psychiatric this patient is able to make decisions and demonstrates good insight into disease process. Alert and Oriented x 3. pleasant and cooperative. General Notes: Upon inspection patient's wound bed actually showed signs of good granulation at this time there does not appear to be any evidence of infection there was minimal [name] noted and overall the Iodoflex seems to be doing well for her. She has not been back to see [place] more recently simply due to the fact that unfortunately she contracted Covid and was not able to come in to be seen. Integumentary (Hair, Skin) Wound #6 status is Open. Original cause of wound was Gradually Appeared. The wound is located on the Right Lower  Leg. The wound measures 3.6cm length x 4cm width x 0.1cm depth; 11.31cm^2 area and 1.131cm^3 volume. There is Fat Layer (Subcutaneous Tissue) exposed. There is no tunneling or undermining noted. There is a medium amount of serosanguineous drainage noted. The wound margin is distinct with the outline attached to the wound base. There is large (67-100%) pink granulation within the wound bed. There is a small (1-33%) amount of necrotic tissue within the wound bed including Adherent [name]. Wound #8 status is Healed -  Epithelialized. Original cause of wound was Trauma. The wound is located on the Left,Anterior Lower Leg. The wound measures 0cm length x 0cm width x 0cm depth; 0cm^2 area and 0cm^3 volume. Assessment Active Problems ICD-10 Chronic venous hypertension (idiopathic) with ulcer and inflammation of right lower extremity Non-pressure chronic ulcer of other part of right lower leg with fat layer exposed Non-pressure chronic ulcer of other part of left lower leg with fat layer exposed Cellulitis of left lower limb Lymphedema, not elsewhere classified Procedures Wound #6 Pre-procedure diagnosis of Wound #6 is a Lymphedema located on the Right Lower Leg . There was a Three Layer Compression Therapy Procedure by [name], RN. Post procedure Diagnosis Wound #6: Same as Pre-Procedure Plan Follow-up Appointments: Return Appointment in 2 weeks. Dressing Change Frequency: Wound #6 Right Lower Leg: Change dressing three times week. - by home health. Home health to change 2x a week on week that patient has appt at wound clinic. Skin Barriers/Peri-Wound Care: Barrier cream - to excoriated areas Moisturizing lotion - both legs Wound Cleansing: May shower with protection. - use cast protector Primary Wound Dressing: Wound #6 Right Lower Leg: Iodoflex - or Iodosorb gel Secondary Dressing: Wound #6 Right Lower Leg: ABD pad Edema Control: 3 Layer Compression System - Right Lower  Extremity Avoid standing for long periods of time Elevate legs to the level of the heart or above for 30 minutes daily and/or when sitting, a frequency of: - throughout the day whenever sitting Exercise regularly Home Health: [facility] for wound care. - [facility] 1. I would recommend currently that we going to continue with the wound care measures as before specifically with regard to the Iodoflex which I feel like is doing a good job for her. 2. We will also get a continue with 3 layer compression wrap on the right lower extremity to keep things under control here. 3. I am also can recommend the patient continue to elevate her legs much as possible try to keep edema under good control. We will see patient back for reevaluation in 2 weeks here in the clinic. If anything worsens or changes patient will contact our office for additional recommendations. Electronic Signature(s) Signed: 01/14/2020 3:37:59 PM By: Erin Keeler PA-C Entered By: Erin [name] on 01/14/2020 15:37:59 -------------------------------------------------------------------------------- SuperBill Details Patient Name: Date of Service: Erin [name] [name] 01/14/2020 Medical Record Number: [id number] Patient Account Number: [id number] Date of Birth/Sex: Treating RN: 10-Feb-1931 (86 y.o. [name], [name] Primary Care Provider: Arsenio [name] Other Clinician: Referring Provider: Treating Provider/Extender: Erin [name] in Treatment: 39 Diagnosis Coding ICD-10 Codes Code Description I87.331 Chronic venous hypertension (idiopathic) with ulcer and inflammation of right lower extremity L97.812 Non-pressure chronic ulcer of other part of right lower leg with fat layer exposed L97.822 Non-pressure chronic ulcer of other part of left lower leg with fat layer exposed L03.116 Cellulitis of left lower limb I89.0 Lymphedema, not elsewhere classified Facility Procedures CPT4  Code: 47654650 Description: (Facility Use Only) [phone] - [place] RT LEG Modifier: Quantity: 1 Physician Procedures Electronic Signature(s) Signed: 01/14/2020 3:38:11 PM By: Erin Keeler PA-C Entered By: Erin [name] on 01/14/2020 15:38:11

## 2020-01-16 NOTE — Progress Notes (Signed)
SHARAN, MCENANEY ([id number]) Visit Report for 01/14/2020 Arrival Information Details Patient Name: Date of Service: Lindaann Slough [name] 01/14/2020 2:45 PM Medical Record Number: [id number] Patient Account Number: [id number] Date of Birth/Sex: Treating RN: [birth year] (86 y.o. [name], Tammi Klippel Primary Care Elsworth Ledin: [name] Other Clinician: Referring Paolo Okane: Treating Zamyah Wiesman/Extender: [name] in Treatment: 39 Visit Information History Since Last Visit Added or deleted any medications: No Patient Arrived: Wheel Chair Any new allergies or adverse reactions: No Arrival Time: 14:51 Had a fall or experienced change in No Accompanied By: caregiver activities of daily living that may affect Transfer Assistance: None risk of falls: Patient Identification Verified: Yes Signs or symptoms of abuse/neglect since last visito No Secondary Verification Process Completed: Yes Hospitalized since last visit: No Patient Requires Transmission-Based Precautions: No Implantable device outside of the clinic excluding No Patient Has Alerts: Yes cellular tissue based products placed in the center Patient Alerts: NON COMPRESSABLE since last visit: Has Dressing in Place as Prescribed: Yes Has Compression in Place as Prescribed: Yes Pain Present Now: No Electronic Signature(s) Signed: 01/14/2020 5:31:28 PM By: Deon Pilling Entered By: Deon Pilling on 01/14/2020 14:51:48 -------------------------------------------------------------------------------- Compression Therapy Details Patient Name: Date of Service: Lindaann Slough [name] 01/14/2020 2:45 PM Medical Record Number: [id number] Patient Account Number: [id number] Date of Birth/Sex: Treating RN: 10-Jun-1930 (86 y.o. [name] Primary Care Ralyn Stlaurent: [name] Other Clinician: Referring Param Capri: Treating Jamir Rone/Extender: [name] in Treatment: 39 Compression  Therapy Performed for Wound Assessment: Wound #6 Right Lower Leg Performed By: Clinician [name], RN Compression Type: Three Layer Post Procedure Diagnosis Same as Pre-procedure Electronic Signature(s) Signed: 01/14/2020 5:51:45 PM By: [name] RN, BSN Entered By: [name] on 01/14/2020 15:31:40 -------------------------------------------------------------------------------- Encounter Discharge Information Details Patient Name: Date of Service: Lindaann Slough [name] 01/14/2020 2:45 PM Medical Record Number: [id number] Patient Account Number: [id number] Date of Birth/Sex: Treating RN: 07-27-1930 (86 y.o. [name] Primary Care Lavaun Greenfield: [name] Other Clinician: Referring Jacari Kirsten: Treating Corynne Scibilia/Extender: [name] in Treatment: 39 Encounter Discharge Information Items Discharge Condition: Stable Ambulatory Status: Wheelchair Discharge Destination: Home Transportation: Private Auto Accompanied By: self Schedule Follow-up Appointment: Yes Clinical Summary of Care: Patient Declined Electronic Signature(s) Signed: 01/16/2020 5:00:24 PM By: [name] RN Entered By: [name] on 01/14/2020 15:52:26 -------------------------------------------------------------------------------- Lower Extremity Assessment Details Patient Name: Date of Service: Lindaann Slough [name] 01/14/2020 2:45 PM Medical Record Number: [id number] Patient Account Number: [id number] Date of Birth/Sex: Treating RN: 1931/02/02 (86 y.o. [name], Tammi Klippel Primary Care Ndea Kilroy: [name] Other Clinician: Referring Leyanna Bittman: Treating Crystalyn Delia/Extender: [name] in Treatment: 39 Edema Assessment Assessed: [Left: Yes] Patrice Paradise: Yes] Edema: [Left: Yes] [Right: No] Calf Left: Right: Point of Measurement: 35 cm From Medial Instep 31 cm 25 cm Ankle Left: Right: Point of Measurement: 7 cm From Medial Instep 23 cm 21  cm Electronic Signature(s) Signed: 01/14/2020 5:31:28 PM By: Deon Pilling Entered By: Deon Pilling on 01/14/2020 15:05:17 -------------------------------------------------------------------------------- Arnold Line Details Patient Name: Date of Service: Lindaann Slough [name] 01/14/2020 2:45 PM Medical Record Number: [id number] Patient Account Number: [id number] Date of Birth/Sex: Treating RN: 1930/06/22 (86 y.o. [name] Primary Care Amritha Yorke: [name] Other Clinician: Referring Kasin Tonkinson: Treating Annalisia Ingber/Extender: [name] in Treatment: 39 Active Inactive Venous Leg Ulcer Nursing Diagnoses: Actual venous Insuffiency (use after diagnosis is confirmed)  Knowledge deficit related to disease process and management Goals: Patient will maintain optimal edema control Date Initiated: 04/14/2019 Target Resolution Date: 02/11/2020 Goal Status: Active Patient/caregiver will verbalize understanding of disease process and disease management Date Initiated: 04/14/2019 Date Inactivated: 05/19/2019 Target Resolution Date: 05/16/2019 Goal Status: Met Interventions: Assess peripheral edema status every visit. Compression as ordered Provide education on venous insufficiency Notes: Wound/Skin Impairment Nursing Diagnoses: Impaired tissue integrity Knowledge deficit related to ulceration/compromised skin integrity Goals: Patient/caregiver will verbalize understanding of skin care regimen Date Initiated: 04/14/2019 Target Resolution Date: 02/11/2020 Goal Status: Active Ulcer/skin breakdown will have a volume reduction of 30% by week 4 Date Initiated: 04/14/2019 Date Inactivated: 05/19/2019 Target Resolution Date: 05/16/2019 Goal Status: Met Ulcer/skin breakdown will have a volume reduction of 50% by week 8 Date Initiated: 05/19/2019 Date Inactivated: 06/16/2019 Target Resolution Date: 06/20/2019 Goal Status: Met Interventions: Assess  patient/caregiver ability to obtain necessary supplies Assess patient/caregiver ability to perform ulcer/skin care regimen upon admission and as needed Assess ulceration(s) every visit Provide education on ulcer and skin care Notes: Electronic Signature(s) Signed: 01/14/2020 5:51:45 PM By: [name] RN, BSN Entered By: [name] on 01/14/2020 15:30:35 -------------------------------------------------------------------------------- Pain Assessment Details Patient Name: Date of Service: Lindaann Slough [name] 01/14/2020 2:45 PM Medical Record Number: [id number] Patient Account Number: [id number] Date of Birth/Sex: Treating RN: [birth year] (86 y.o. [name] Primary Care Catrell Morrone: [name] Other Clinician: Referring Chauncey Bruno: Treating Karsten Vaughn/Extender: [name] in Treatment: 39 Active Problems Location of Pain Severity and Description of Pain Patient Has Paino No Site Locations Rate the pain. Current Pain Level: 0 Pain Management and Medication Current Pain Management: Medication: No Cold Application: No Rest: No Massage: No Activity: No T.E.N.S.: No Heat Application: No Leg drop or elevation: No Is the Current Pain Management Adequate: Adequate How does your wound impact your activities of daily livingo Sleep: No Bathing: No Appetite: No Relationship With Others: No Bladder Continence: No Emotions: No Bowel Continence: No Work: No Toileting: No Drive: No Dressing: No Hobbies: No Electronic Signature(s) Signed: 01/14/2020 5:31:28 PM By: Deon Pilling Entered By: Deon Pilling on 01/14/2020 15:04:57 -------------------------------------------------------------------------------- Patient/Caregiver Education Details Patient Name: Date of Service: Lindaann Slough [name] 11/3/2021andnbsp2:45 PM Medical Record Number: [id number] Patient Account Number: [id number] Date of Birth/Gender: Treating RN: 02-28-1931 (86  y.o. [name] Primary Care Physician: [name] Other Clinician: Referring Physician: Treating Physician/Extender: [name] in Treatment: 39 Education Assessment Education Provided To: Patient Education Topics Provided Venous: Methods: Explain/Verbal Responses: Reinforcements needed, State content correctly Wound/Skin Impairment: Methods: Explain/Verbal Responses: Reinforcements needed, State content correctly Electronic Signature(s) Signed: 01/14/2020 5:51:45 PM By: [name] RN, BSN Entered By: [name] on 01/14/2020 15:31:03 -------------------------------------------------------------------------------- Wound Assessment Details Patient Name: Date of Service: Lindaann Slough [name] 01/14/2020 2:45 PM Medical Record Number: [id number] Patient Account Number: [id number] Date of Birth/Sex: Treating RN: [birth year] (86 y.o. [name], Tammi Klippel Primary Care Maigan Bittinger: [name] Other Clinician: Referring Allexa Acoff: Treating Acquanetta Cabanilla/Extender: [name] in Treatment: 39 Wound Status Wound Number: 6 Primary Lymphedema Etiology: Wound Location: Right Lower Leg Wound Status: Open Wounding Event: Gradually Appeared Comorbid Cataracts, Glaucoma, Middle ear problems, Anemia, Date Acquired: 03/14/2019 History: Hypertension Weeks Of Treatment: 39 Clustered Wound: No Wound Measurements Length: (cm) 3.6 Width: (cm) 4 Depth: (cm) 0.1 Area: (cm) 11.31 Volume: (cm) 1.131 % Reduction in Area: 91.6% % Reduction in Volume: 97.9% Epithelialization: Medium (34-66%) Tunneling: No Undermining:  No Wound Description Classification: Full Thickness Without Exposed Support Structures Wound Margin: Distinct, outline attached Exudate Amount: Medium Exudate Type: Serosanguineous Exudate Color: red, brown Foul Odor After Cleansing: No Slough/Fibrino Yes Wound Bed Granulation Amount: Large (67-100%) Exposed  Structure Granulation Quality: Pink Fascia Exposed: No Necrotic Amount: Small (1-33%) Fat Layer (Subcutaneous Tissue) Exposed: Yes Necrotic Quality: Adherent Slough Tendon Exposed: No Muscle Exposed: No Joint Exposed: No Bone Exposed: No Treatment Notes Wound #6 (Right Lower Leg) 1. Cleanse With Wound Cleanser Soap and water 2. Periwound Care Barrier cream 3. Primary Dressing Applied Calcium Alginate Ag 4. Secondary Dressing Dry Gauze 6. Support Layer Applied 3 layer compression wrap Notes netting Electronic Signature(s) Signed: 01/14/2020 5:31:28 PM By: Deon Pilling Entered By: Deon Pilling on 01/14/2020 15:06:14 -------------------------------------------------------------------------------- Wound Assessment Details Patient Name: Date of Service: Lindaann Slough [name] 01/14/2020 2:45 PM Medical Record Number: [id number] Patient Account Number: [id number] Date of Birth/Sex: Treating RN: 03/25/1930 (86 y.o. [name], Tammi Klippel Primary Care Aaryn Parrilla: [name] Other Clinician: Referring Tobie Hellen: Treating Tyron Manetta/Extender: [name] in Treatment: 39 Wound Status Wound Number: 8 Primary Etiology: Trauma, Other Wound Location: Left, Anterior Lower Leg Secondary Etiology: Lymphedema Wounding Event: Trauma Wound Status: Healed - Epithelialized Date Acquired: 09/04/2019 Weeks Of Treatment: 18 Clustered Wound: No Wound Measurements Length: (cm) Width: (cm) Depth: (cm) Area: (cm) Volume: (cm) 0 % Reduction in Area: 100% 0 % Reduction in Volume: 100% 0 0 0 Wound Description Classification: Full Thickness Without Exposed Support Structu res Electronic Signature(s) Signed: 01/14/2020 5:31:28 PM By: Deon Pilling Entered By: Deon Pilling on 01/14/2020 15:05:30 -------------------------------------------------------------------------------- Vitals Details Patient Name: Date of Service: Lindaann Slough [name] 01/14/2020 2:45  PM Medical Record Number: [id number] Patient Account Number: [id number] Date of Birth/Sex: Treating RN: [birth year] (86 y.o. [name], Tammi Klippel Primary Care Laynee Lockamy: [name] Other Clinician: Referring Brentney Goldbach: Treating Mekala Winger/Extender: [name] in Treatment: 39 Vital Signs Time Taken: 14:50 Temperature (F): 98.2 Height (in): 60 Pulse (bpm): 65 Weight (lbs): 130 Respiratory Rate (breaths/min): 18 Body Mass Index (BMI): 25.4 Blood Pressure (mmHg): 164/85 Reference Range: 80 - 120 mg / dl Electronic Signature(s) Signed: 01/14/2020 5:31:28 PM By: Deon Pilling Entered By: Deon Pilling on 01/14/2020 15:04:40

## 2020-01-29 ENCOUNTER — Other Ambulatory Visit: Payer: Self-pay

## 2020-01-29 ENCOUNTER — Encounter (HOSPITAL_BASED_OUTPATIENT_CLINIC_OR_DEPARTMENT_OTHER): Payer: Medicare HMO | Admitting: Internal Medicine

## 2020-01-29 DIAGNOSIS — I87331 Chronic venous hypertension (idiopathic) with ulcer and inflammation of right lower extremity: Secondary | ICD-10-CM | POA: Diagnosis not present

## 2020-01-29 NOTE — Progress Notes (Signed)
MONDA, CHASTAIN ([id number]) Visit Report for 01/29/2020 Arrival Information Details Patient Name: Date of Service: Erin [name] [name] 01/29/2020 2:45 PM Medical Record Number: [id number] Patient Account Number: [id number] Date of Birth/Sex: Treating RN: Nov 13, 1930 (86 y.o. Female) [name] Primary Care Provider: [name] Other Clinician: Referring Provider: Treating Provider/Extender: Volney American in Treatment: 8 Visit Information History Since Last Visit All ordered tests and consults were completed: No Patient Arrived: Wheel Chair Added or deleted any medications: No Arrival Time: 15:18 Any new allergies or adverse reactions: No Accompanied By: caregiver Had a fall or experienced change in No Transfer Assistance: None activities of daily living that may affect Patient Identification Verified: Yes risk of falls: Secondary Verification Process Completed: Yes Signs or symptoms of abuse/neglect since last visito No Patient Requires Transmission-Based Precautions: No Hospitalized since last visit: No Patient Has Alerts: Yes Implantable device outside of the clinic excluding No Patient Alerts: NON COMPRESSABLE cellular tissue based products placed in the center since last visit: Has Dressing in Place as Prescribed: Yes Has Compression in Place as Prescribed: Yes Pain Present Now: No Electronic Signature(s) Signed: 01/29/2020 5:30:22 PM By: [name] RN Entered By: [name] on 01/29/2020 15:19:06 -------------------------------------------------------------------------------- Compression Therapy Details Patient Name: Date of Service: Erin [name] [name] 01/29/2020 2:45 PM Medical Record Number: [id number] Patient Account Number: [id number] Date of Birth/Sex: Treating RN: 07-22-1930 (86 y.o. Female) Deon Pilling Primary Care Provider: [name] Other Clinician: Referring Provider: Treating Provider/Extender: Volney American in Treatment: [phone] Compression Therapy Performed for Wound Assessment: Wound #6 Right Lower Leg Performed By: Clinician [name], RN Compression Type: Three Layer Post Procedure Diagnosis Same as Pre-procedure Electronic Signature(s) Signed: 01/29/2020 5:38:55 PM By: Deon Pilling Entered By: Deon Pilling on 01/29/2020 15:24:36 -------------------------------------------------------------------------------- Encounter Discharge Information Details Patient Name: Date of Service: Erin [name] [name] 01/29/2020 2:45 PM Medical Record Number: [id number] Patient Account Number: [id number] Date of Birth/Sex: Treating RN: 1930-10-25 (86 y.o. Female) Deon Pilling Primary Care Provider: [name] Other Clinician: Referring Provider: Treating Provider/Extender: Volney American in Treatment: 10 Encounter Discharge Information Items Post Procedure Vitals Discharge Condition: Stable Temperature (F): 98.3 Ambulatory Status: Wheelchair Pulse (bpm): 84 Discharge Destination: Home Respiratory Rate (breaths/min): 18 Transportation: Private Auto Blood Pressure (mmHg): 175/80 Accompanied By: self Schedule Follow-up Appointment: Yes Clinical Summary of Care: Patient Declined Electronic Signature(s) Signed: 01/29/2020 5:33:13 PM By: Rhae Hammock RN Entered By: Rhae Hammock on 01/29/2020 17:33:13 -------------------------------------------------------------------------------- Lower Extremity Assessment Details Patient Name: Date of Service: Erin [name] [name] 01/29/2020 2:45 PM Medical Record Number: [id number] Patient Account Number: [id number] Date of Birth/Sex: Treating RN: Nov 27, 1930 (86 y.o. Female) [name] Primary Care Provider: [name] Other Clinician: Referring Provider: Treating Provider/Extender: Volney American in Treatment: 41 Edema Assessment Assessed: Shirlyn Goltz: No]  [Right: No] Edema: [Left: Yes] [Right: No] Calf Left: Right: Point of Measurement: 35 cm From Medial Instep 29 cm Ankle Left: Right: Point of Measurement: 7 cm From Medial Instep 21 cm Electronic Signature(s) Signed: 01/29/2020 5:30:22 PM By: [name] RN Entered By: [name] on 01/29/2020 15:19:56 -------------------------------------------------------------------------------- Multi Wound Chart Details Patient Name: Date of Service: Erin [name] [name] 01/29/2020 2:45 PM Medical Record Number: [id number] Patient Account Number: [id number] Date of Birth/Sex: Treating RN: [birth year] (86 y.o. Female) Deon Pilling Primary Care Provider: [name] Other Clinician: Referring Provider: Treating Provider/Extender: Volney American in Treatment:  41 Vital Signs Height(in): 60 Pulse(bpm): 84 Weight(lbs): 130 Blood Pressure(mmHg): 175/80 Body Mass Index(BMI): 25 Temperature(F): 98.3 Respiratory Rate(breaths/min): 18 Photos: [6:No Photos Right Lower Leg] [N/A:N/A N/A] Wound Location: [6:Gradually Appeared] [N/A:N/A] Wounding Event: [6:Lymphedema] [N/A:N/A] Primary Etiology: [6:Cataracts, Glaucoma, Middle ear] [N/A:N/A] Comorbid History: [6:problems, Anemia, Hypertension 03/14/2019] [N/A:N/A] Date Acquired: [6:41] [N/A:N/A] Weeks of Treatment: [6:Open] [N/A:N/A] Wound Status: [6:3x3x0.2] [N/A:N/A] Measurements L x W x D (cm) [5:9.935] [N/A:N/A] A (cm) : rea [6:1.414] [N/A:N/A] Volume (cm) : [6:94.70%] [N/A:N/A] % Reduction in A [6:rea: 97.40%] [N/A:N/A] % Reduction in Volume: [6:Full Thickness Without Exposed] [N/A:N/A] Classification: [6:Support Structures Medium] [N/A:N/A] Exudate A mount: [6:Serosanguineous] [N/A:N/A] Exudate Type: [6:red, brown] [N/A:N/A] Exudate Color: [6:Distinct, outline attached] [N/A:N/A] Wound Margin: [6:Large (67-100%)] [N/A:N/A] Granulation A mount: [6:Pink] [N/A:N/A] Granulation Quality: [6:Small (1-33%)]  [N/A:N/A] Necrotic A mount: [6:Fat Layer (Subcutaneous Tissue): Yes N/A] Exposed Structures: [6:Fascia: No Tendon: No Muscle: No Joint: No Bone: No Medium (34-66%)] [N/A:N/A] Epithelialization: [6:Debridement - Excisional] [N/A:N/A] Debridement: Pre-procedure Verification/Time Out 15:22 [N/A:N/A] Taken: [6:Lidocaine 4% Topical Solution] [N/A:N/A] Pain Control: [6:Subcutaneous, [name]] [N/A:N/A] Tissue Debrided: [6:Skin/Subcutaneous Tissue] [N/A:N/A] Level: [6:9] [N/A:N/A] Debridement A (sq cm): [6:rea Curette] [N/A:N/A] Instrument: [6:Minimum] [N/A:N/A] Bleeding: [6:Pressure] [N/A:N/A] Hemostasis A chieved: [6:0] [N/A:N/A] Procedural Pain: [6:0] [N/A:N/A] Post Procedural Pain: [6:Procedure was tolerated well] [N/A:N/A] Debridement Treatment Response: [6:3x3x0.2] [N/A:N/A] Post Debridement Measurements L x W x D (cm) [6:1.414] [N/A:N/A] Post Debridement Volume: (cm) [6:Compression Therapy] [N/A:N/A] Procedures Performed: [6:Debridement] Treatment Notes Electronic Signature(s) Signed: 01/29/2020 5:14:03 PM By: Linton Ham MD Signed: 01/29/2020 5:38:55 PM By: Deon Pilling Entered By: Linton Ham on 01/29/2020 15:57:18 -------------------------------------------------------------------------------- Multi-Disciplinary Care Plan Details Patient Name: Date of Service: Erin [name] [name] 01/29/2020 2:45 PM Medical Record Number: [id number] Patient Account Number: [id number] Date of Birth/Sex: Treating RN: [birth year] (86 y.o. Female) Deon Pilling Primary Care Raylea Adcox: [name] Other Clinician: Referring Sirinity Outland: Treating Akshath Mccarey/Extender: Volney American in Treatment: 16 Active Inactive Venous Leg Ulcer Nursing Diagnoses: Actual venous Insuffiency (use after diagnosis is confirmed) Knowledge deficit related to disease process and management Goals: Patient will maintain optimal edema control Date Initiated: 04/14/2019 Target Resolution  Date: 03/11/2020 Goal Status: Active Patient/caregiver will verbalize understanding of disease process and disease management Date Initiated: 04/14/2019 Date Inactivated: 05/19/2019 Target Resolution Date: 05/16/2019 Goal Status: Met Interventions: Assess peripheral edema status every visit. Compression as ordered Provide education on venous insufficiency Notes: Wound/Skin Impairment Nursing Diagnoses: Impaired tissue integrity Knowledge deficit related to ulceration/compromised skin integrity Goals: Patient/caregiver will verbalize understanding of skin care regimen Date Initiated: 04/14/2019 Target Resolution Date: 03/10/2020 Goal Status: Active Ulcer/skin breakdown will have a volume reduction of 30% by week 4 Date Initiated: 04/14/2019 Date Inactivated: 05/19/2019 Target Resolution Date: 05/16/2019 Goal Status: Met Ulcer/skin breakdown will have a volume reduction of 50% by week 8 Date Initiated: 05/19/2019 Date Inactivated: 06/16/2019 Target Resolution Date: 06/20/2019 Goal Status: Met Interventions: Assess patient/caregiver ability to obtain necessary supplies Assess patient/caregiver ability to perform ulcer/skin care regimen upon admission and as needed Assess ulceration(s) every visit Provide education on ulcer and skin care Notes: Electronic Signature(s) Signed: 01/29/2020 5:38:55 PM By: Deon Pilling Entered By: Deon Pilling on 01/29/2020 15:01:47 -------------------------------------------------------------------------------- Pain Assessment Details Patient Name: Date of Service: Erin [name] [name] 01/29/2020 2:45 PM Medical Record Number: [id number] Patient Account Number: [id number] Date of Birth/Sex: Treating RN: 1930/04/26 (86 y.o. Female) [name] Primary Care Pebbles Zeiders: [name] Other Clinician: Referring Stephane Junkins: Treating Ysabel Stankovich/Extender: Volney American in Treatment: [phone] Active  Problems Location of Pain Severity and  Description of Pain Patient Has Paino No Site Locations Pain Management and Medication Current Pain Management: Electronic Signature(s) Signed: 01/29/2020 5:30:22 PM By: [name] RN Entered By: [name] on 01/29/2020 15:19:40 -------------------------------------------------------------------------------- Patient/Caregiver Education Details Patient Name: Date of Service: Erin [name] [name] 11/18/2021andnbsp2:45 PM Medical Record Number: [id number] Patient Account Number: [id number] Date of Birth/Gender: Treating RN: 1930/06/18 (86 y.o. Female) Deon Pilling Primary Care Physician: [name] Other Clinician: Referring Physician: Treating Physician/Extender: Volney American in Treatment: 71 Education Assessment Education Provided To: Patient Education Topics Provided Venous: Handouts: Controlling Swelling with Multilayered Compression Wraps, Managing Venous Disease and Related Ulcers Methods: Explain/Verbal Responses: Reinforcements needed Electronic Signature(s) Signed: 01/29/2020 5:38:55 PM By: Deon Pilling Entered By: Deon Pilling on 01/29/2020 15:02:00 -------------------------------------------------------------------------------- Wound Assessment Details Patient Name: Date of Service: Erin [name] [name] 01/29/2020 2:45 PM Medical Record Number: [id number] Patient Account Number: [id number] Date of Birth/Sex: Treating RN: 10-08-1930 (86 y.o. Female) [name] Primary Care Provider: [name] Other Clinician: Referring Provider: Treating Provider/Extender: Volney American in Treatment: 41 Wound Status Wound Number: 6 Primary Lymphedema Etiology: Wound Location: Right Lower Leg Wound Status: Open Wounding Event: Gradually Appeared Comorbid Cataracts, Glaucoma, Middle ear problems, Anemia, Date Acquired: 03/14/2019 History: Hypertension Weeks Of Treatment: 41 Clustered Wound: No Wound  Measurements Length: (cm) 3 Width: (cm) 3 Depth: (cm) 0.2 Area: (cm) 7.069 Volume: (cm) 1.414 % Reduction in Area: 94.7% % Reduction in Volume: 97.4% Epithelialization: Medium (34-66%) Tunneling: No Undermining: No Wound Description Classification: Full Thickness Without Exposed Support Structures Wound Margin: Distinct, outline attached Exudate Amount: Medium Exudate Type: Serosanguineous Exudate Color: red, brown Foul Odor After Cleansing: No [name]/Fibrino Yes Wound Bed Granulation Amount: Large (67-100%) Exposed Structure Granulation Quality: Pink Fascia Exposed: No Necrotic Amount: Small (1-33%) Fat Layer (Subcutaneous Tissue) Exposed: Yes Necrotic Quality: Adherent [name] Tendon Exposed: No Muscle Exposed: No Joint Exposed: No Bone Exposed: No Treatment Notes Wound #6 (Right Lower Leg) 1. Cleanse With Saline Soap and water 2. Periwound Care Barrier cream 3. Primary Dressing Applied Iodoflex 4. Secondary Dressing ABD Pad 6. Support Layer Applied 3 layer compression wrap Notes netting Electronic Signature(s) Signed: 01/29/2020 5:30:22 PM By: [name] RN Entered By: [name] on 01/29/2020 15:20:22 -------------------------------------------------------------------------------- Vitals Details Patient Name: Date of Service: Erin [name] [name] 01/29/2020 2:45 PM Medical Record Number: [id number] Patient Account Number: [id number] Date of Birth/Sex: Treating RN: 1930/08/28 (86 y.o. Female) [name] Primary Care Provider: [name] Other Clinician: Referring Provider: Treating Provider/Extender: Volney American in Treatment: 41 Vital Signs Time Taken: 15:19 Temperature (F): 98.3 Height (in): 60 Pulse (bpm): 84 Weight (lbs): 130 Respiratory Rate (breaths/min): 18 Body Mass Index (BMI): 25.4 Blood Pressure (mmHg): 175/80 Reference Range: 80 - 120 mg / dl Electronic Signature(s) Signed: 01/29/2020 5:30:22  PM By: [name] RN Entered By: [name] on 01/29/2020 15:19:31

## 2020-01-29 NOTE — Progress Notes (Signed)
Erin [name] ([id number]) Visit Report for 01/29/2020 Debridement Details Patient Name: Date of Service: [name] [name] 01/29/2020 2:45 PM Medical Record Number: [id number] Patient Account Number: [id number] Date of Birth/Sex: Treating RN: [birth year] (86 y.o. Female) Erin [name] Primary Care Provider: Arsenio [name] Other Clinician: Referring Provider: Treating Provider/Extender: [place] [name] in Treatment: [phone] Debridement Performed for Assessment: Wound #6 Right Lower Leg Performed By: Physician [name]., MD Debridement Type: Debridement Level of Consciousness (Pre-procedure): Awake and Alert Pre-procedure Verification/Time Out Yes - 15:22 Taken: Start Time: 15:23 Pain Control: Lidocaine 4% T opical Solution T Area Debrided (L x W): otal 3 (cm) x 3 (cm) = 9 (cm) Tissue and other material debrided: Viable, Non-Viable, [name], Subcutaneous, Skin: Dermis , Fibrin/Exudate, [name] Level: Skin/Subcutaneous Tissue Debridement Description: Excisional Instrument: Curette Bleeding: Minimum Hemostasis Achieved: Pressure End Time: 15:28 Procedural Pain: 0 Post Procedural Pain: 0 Response to Treatment: Procedure was tolerated well Level of Consciousness (Post- Awake and Alert procedure): Post Debridement Measurements of Total Wound Length: (cm) 3 Width: (cm) 3 Depth: (cm) 0.2 Volume: (cm) 1.414 Character of Wound/Ulcer Post Debridement: Improved Post Procedure Diagnosis Same as Pre-procedure Electronic Signature(s) Signed: 01/29/2020 5:14:03 PM By: Linton Ham MD Signed: 01/29/2020 5:38:55 PM By: Erin [name] Entered By: Linton Ham on 01/29/2020 15:57:29 -------------------------------------------------------------------------------- HPI Details Patient Name: Date of Service: [name] [name] 01/29/2020 2:45 PM Medical Record Number: [id number] Patient Account Number: [id number] Date of Birth/Sex: Treating  RN: 02-12-1931 (86 y.o. Female) [name] Primary Care Provider: Arsenio [name] Other Clinician: Referring Provider: Treating Provider/Extender: [place] [name] in Treatment: 20 History of Present Illness HPI Description: We to find a new wound on the left 03/22/17; this is an 86 year old woman. There is not a lot of information in care everywhere on this patient. She had a history of a malignant neoplasm her left breast for which she had lumpectomy but she did not have radiation. Apparently she has had long- standing edema and her lower legs and she was seen in the [facility] care [name] at [name] [name] by Dr. Nils [name] October. Both her legs were wrapped however it sounds as though the left leg became secondarily infected she apparently had MRSA and she was admitted the [name]. Not really turn for wound care. She lives at home on her own and has apparently [name] care. We are not exactly clear what [name] is doing to her legs but it does not involve compression. She is not a diabetic. ABIs in our clinic were noncompressible bilaterally 03/30/17; this is a patient who has bilateral chronic venous insufficiency, severe venous inflammation. We had a nice description number legs from a note by Dr. Thurnell [name] dating 2010 describing edematous warm legs with erythema. This suggests that she has chronic venous insufficiency with inflammation.. We have arterial studies dated 12/29/16 again from [name]. This showed a right ABI at 1 and a TBI of 0.81 on the left the ABI was 1.25 and a TBI of 0.72. This was just she does not have significant arterial insufficiency. She had normal triphasic waveforms noted that she was admitted to [name] in late October with MSSA cellulitis Last visit we wrapped both her legs. She had an open area predominantly on the left anterior but weeping edema on the right anterior leg. She has done well there is only one at  anterior left tibial wound 04/06/17; the patient has no open area on her right leg.  Her remaining wound is on the left anterior leg, the left posterior leg wound is healed. We have been using silver alginate changed to [place] [name] on the left leg today she has home [name] 04/13/17; still no open area on her right leg. Her remaining wound on the left anterior leg. We have been using using Hydrofera Blue 04/20/17; no open area on the right leg although the edema here is somewhat disfiguring. Her remaining wound is on the left anterior leg we've been using Hydrofera Blue with improvement in dimensions. She has [name] and lives in [place]. She requests to come every 2 weeks because of the distance involved in coming here 05/04/17; there is no open area on the right leg. She has a very small left anterior leg wound that remains. However what remains on the left still has some depth and a not to viable looking surface. It is too small to really attempt debridement. She has home [name] coming out to her home in [name] 05/18/17; there is no open area on either leg. She has significant chronic venous insufficiency. The left anterior leg wound that was still open 2 weeks ago has closed. She has home [name] coming out. We are going to order Juxtalite stockings for both legs. She is agreed to pay for these privately. We will order them from [name] READMISSION 04/14/2019 This is a patient we previously had in this clinic in 2019 for 3 months. She had a wound on the left anterior greater than right anterior leg. According to my notes we discharged her and juxta lite stockings although it does not appear that she ever got them. The history here is a bit difficult. She apparently has had a wound on the left lower leg for the last 6 months. She required admission to [name] in [place] for apparent cellulitis just before Christmas was discharged home she has an open wound. She has a home [name]  agency although we are not exactly sure which one. They are applying silver alginate. No compression Past medical history is reviewed she has chronic venous insufficiency and a history of left breast CA. 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 She did not have any compression on the wounds today. 2/8; substantial wound on the right posterior calf and a smaller area on the left medial calf. We are using Iodoflex to both wound areas under compression. We have better edema control We to find a new wound on the left anterior medial calf 2/15; the area on the left medial calf is closed over. Substantial area on the right posterior and lateral calf. We have been using Iodoflex although the patient complains of pain and wonders if there is not an alternative. She has home [name] changing the dressing twice a week and she sees [place] once 2/22; the wound on the left medial calf is closed over. Her edema control is not good here but she cannot get on stockings. This substantial wound is on the right lateral and right posterior calf. Changed her to a hydrofera Blue last week. Gradual improvement in wound area. 3/8; wound on the left leg remains closed. The substantial wound is on the right lateral and right posterior calf. This is measuring smaller 3/22; 2-week follow-up. Substantial area on the right posterior and lateral calf measures slightly smaller. We are using Hydrofera  Blue under compression 4/5; 2-week follow-up. Substantial area on the right posterior lateral calf. This measures slightly smaller. She has islands of epithelialization we have been using Hydrofera Blue under compression 4/19; 2-week follow-up. We have been using Hydrofera Blue however there is maceration around the wound. I will change to silver alginate today. She has islands of epithelialization however I  did not see much evidence that things were improving here. 5/6; 2-week follow-up. I put silver alginate on this because of maceration around the wound last time. However she comes in today with a lot of very adherent debris on the wound of the right lateral leg. 5/17; 2-week follow-up. Using Hydrofera Blue under compression. The patient has epithelialization including islands of epithelialization in the middle of fairly substantial wound area on the right posterior lateral calf 6/1; 2-week follow-up. Using Hydrofera Blue under compression. She seems to have increasing epithelialization. This is an irregular wound going from medial to lateral posteriorly in the right posterior calf. This would not be easy wound to get reproducible measurement 08/25/2019 upon evaluation today patient appears to actually be doing better in regard to the wound on her leg at this point. We have been using Adaptic followed by [name] this is keeping her from getting stuck. The patient is aware that this is also potentially slowing down her healing simply due to the fact that the [name] not in direct contact with the wound bed. Nonetheless she was not happy with the way it was sticking previous which is why we switched to this according to what I can read in the note and hear from the patient and her aide with her today. 6/28; 2-week follow-up. We are following this patient for severe wound on the right lower leg secondary to chronic venous insufficiency using Hydrofera Blue under compression we are making gradual improvements. She arrives in clinic today with what looks like a contusion on the left anterior mid tibia. She says she was washing her leg and pulled some skin. However there is a fairly large wound with surrounding erythema and some swelling 7/12; 2-week follow-up. Right lower leg wound not quite as good as I thought. There is still islands of epithelialization we are using Hydrofera Blue with a  contact layer I would like to see if we can get rid of the contact layer at this point. The new area from last time is a lot smaller She did not take the cephalexin I initially prescribed for the erythema edema around the left leg due to fears about diarrhea. She did tolerate doxycycline that we subsequently called in 7/26; we are doing 2-week follow-up. Wound measurements are about the same. Still the same fibrinous debris we have been using Hydrofera Blue. Change to Iodoflex today. 8/17; been using Iodoflex. Some improvement in the surface area of the large wound on the right posterior calf left anterior actually slightly larger. 100% [name] covered although it is less adherent 9/seven 3-week follow-up. Been using Iodoflex. We still have continued improvement in the surface area of the large wound on the right calf. The small area on the left anteriorly still is not closed. 01/14/2020 on evaluation today patient appears to be doing decently well in regard to her leg ulcer. Fortunately there is no signs of active infection at this time. With that being said the wound on the right leg laterally is still open and given her trouble. Fortunately there is no signs of active infection at this time. No fevers, chills, nausea,  vomiting, or diarrhea. 11/18; I have not seen this woman in quite some time. She told me she had Covid in September. She has chronic venous insufficiency with lymphedema and initially had wounds on her right lateral and left anterior lower legs. The left anterior leg is closed she is wearing a stocking Electronic Signature(s) Signed: 01/29/2020 5:14:03 PM By: Linton Ham MD Entered By: Linton Ham on 01/29/2020 15:58:38 -------------------------------------------------------------------------------- Physical Exam Details Patient Name: Date of Service: [name] [name] 01/29/2020 2:45 PM Medical Record Number: [id number] Patient Account Number: [id number] Date of  Birth/Sex: Treating RN: 1930-11-14 (86 y.o. Female) Erin [name] Primary Care Provider: Arsenio [name] Other Clinician: Referring Provider: Treating Provider/Extender: [place] [name] in Treatment: 71 Constitutional Patient is hypertensive.. Pulse regular and within target range for patient.Marland Kitchen Respirations regular, non-labored and within target range.. Temperature is normal and within the target range for the patient.Marland Kitchen Appears in no distress. Notes Wound exam; the patient's wound is on the right lateral lower leg. 100% covered in a tightly adherent fibrinous debris. I used a #5 curette to remove this. I was able to get [name] some healthier granulation but further debridements are going to be necessary most likely. Electronic Signature(s) Signed: 01/29/2020 5:14:03 PM By: Linton Ham MD Entered By: Linton Ham on 01/29/2020 15:59:36 -------------------------------------------------------------------------------- Physician Orders Details Patient Name: Date of Service: [place] [name] [name] 01/29/2020 2:45 PM Medical Record Number: [id number] Patient Account Number: [id number] Date of Birth/Sex: Treating RN: November 19, 1930 (86 y.o. Female) Erin [name] Primary Care Provider: Arsenio [name] Other Clinician: Referring Provider: Treating Provider/Extender: [place] [name] in Treatment: 21 Verbal / Phone Orders: No Diagnosis Coding ICD-10 Coding Code Description I87.331 Chronic venous hypertension (idiopathic) with ulcer and inflammation of right lower extremity L97.812 Non-pressure chronic ulcer of other part of right lower leg with fat layer exposed L97.822 Non-pressure chronic ulcer of other part of left lower leg with fat layer exposed L03.116 Cellulitis of left lower limb I89.0 Lymphedema, not elsewhere classified Follow-up Appointments Return Appointment in 2 weeks. Dressing Change Frequency Wound #6 Right Lower Leg Change dressing  three times week. - by home [name]. Home [name] to change 2x a week on week that patient has appt at wound clinic. Skin Barriers/Peri-Wound Care Barrier cream - to excoriated areas Moisturizing lotion - both legs Wound Cleansing May shower with protection. - use cast protector Primary Wound Dressing Wound #6 Right Lower Leg Iodoflex - or Iodosorb gel Secondary Dressing Wound #6 Right Lower Leg ABD pad Edema Control 3 Layer Compression System - Right Lower Extremity Avoid standing for long periods of time Elevate legs to the level of the heart or above for 30 minutes daily and/or when sitting, a frequency of: - throughout the day whenever sitting Exercise regularly [place] skilled [facility] for wound care. - [facility] Electronic Signature(s) Signed: 01/29/2020 5:14:03 PM By: Linton Ham MD Signed: 01/29/2020 5:38:55 PM By: Erin [name] Entered By: Erin [name] on 01/29/2020 15:32:40 -------------------------------------------------------------------------------- Problem List Details Patient Name: Date of Service: [name] [name] 01/29/2020 2:45 PM Medical Record Number: [id number] Patient Account Number: [id number] Date of Birth/Sex: Treating RN: [birth year] (86 y.o. Female) [name] Primary Care Provider: Arsenio [name] Other Clinician: Referring Provider: Treating Provider/Extender: Erin [name] in Treatment: 49 Active Problems ICD-10 Encounter Code Description Active Date MDM Diagnosis I87.331 Chronic venous hypertension (idiopathic) with ulcer and inflammation of right 04/14/2019 No Yes lower extremity L97.812 Non-pressure chronic  ulcer of other part of right lower leg with fat layer 04/14/2019 No Yes exposed I89.0 Lymphedema, not elsewhere classified 04/14/2019 No Yes Inactive Problems ICD-10 Code Description Active Date Inactive Date L97.821 Non-pressure chronic ulcer of other part of left lower leg limited  to breakdown of skin 04/21/2019 04/21/2019 W09.811 Non-pressure chronic ulcer of other part of left lower leg with fat layer exposed 09/08/2019 09/08/2019 L03.116 Cellulitis of left lower limb 09/08/2019 09/08/2019 Resolved Problems Electronic Signature(s) Signed: 01/29/2020 5:14:03 PM By: Linton Ham MD Entered By: Linton Ham on 01/29/2020 15:57:08 -------------------------------------------------------------------------------- Progress Note Details Patient Name: Date of Service: [name] [name] 01/29/2020 2:45 PM Medical Record Number: [id number] Patient Account Number: [id number] Date of Birth/Sex: Treating RN: [birth year] (86 y.o. Female) [name] Primary Care Provider: Arsenio [name] Other Clinician: Referring Provider: Treating Provider/Extender: [place] [name] in Treatment: 41 Subjective History of Present Illness (HPI) We to find a new wound on the left 03/22/17; this is an 86 year old woman. There is not a lot of information in care everywhere on this patient. She had a history of a malignant neoplasm her left breast for which she had lumpectomy but she did not have radiation. Apparently she has had long-standing edema and her lower legs and she was seen in the [facility] care [name] in [place] by Dr. Nils [name] October. Both her legs were wrapped however it sounds as though the left leg became secondarily infected she apparently had MRSA and she was admitted the [name]. Not really turn for wound care. She lives at home on her own and has apparently [name] care. We are not exactly clear what [name] is doing to her legs but it does not involve compression. She is not a diabetic. ABIs in our clinic were noncompressible bilaterally 03/30/17; this is a patient who has bilateral chronic venous insufficiency, severe venous inflammation. We had a nice description number legs from a note by Dr. Thurnell [name] dating 2010 describing  edematous warm legs with erythema. This suggests that she has chronic venous insufficiency with inflammation.. We have arterial studies dated 12/29/16 again from [name] This showed a right ABI at 1 and a TBI of 0.81 on the left the ABI was 1.25 and a TBI of 0.72. This was just she does not have significant arterial insufficiency. She had normal triphasic waveforms noted that she was admitted to [name] in late October with MSSA cellulitis Last visit we wrapped both her legs. She had an open area predominantly on the left anterior but weeping edema on the right anterior leg. She has done well there is only one at anterior left tibial wound 04/06/17; the patient has no open area on her right leg. Her remaining wound is on the left anterior leg, the left posterior leg wound is healed. We have been using silver alginate changed to [place] [name] on the left leg today she has home [name] 04/13/17; still no open area on her right leg. Her remaining wound on the left anterior leg. We have been using using Hydrofera Blue 04/20/17; no open area on the right leg although the edema here is somewhat disfiguring. Her remaining wound is on the left anterior leg we've been using Hydrofera Blue with improvement in dimensions. She has [name] and lives in [place]. She requests to come every 2 weeks because of the distance involved in coming here 05/04/17; there is no open area on the right leg. She has a very  small left anterior leg wound that remains. However what remains on the left still has some depth and a not to viable looking surface. It is too small to really attempt debridement. She has home [name] coming out to her home in [place] [name] 05/18/17; there is no open area on either leg. She has significant chronic venous insufficiency. The left anterior leg wound that was still open 2 weeks ago has closed. She has home [name] coming out. We are going to order Juxtalite stockings for both  legs. She is agreed to pay for these privately. We will order them from [name] READMISSION 04/14/2019 This is a patient we previously had in this clinic in 2019 for 3 months. She had a wound on the left anterior greater than right anterior leg. According to my notes we discharged her and juxta lite stockings although it does not appear that she ever got them. The history here is a bit difficult. She apparently has had a wound on the left lower leg for the last 6 months. She required admission to [name] in [name] for apparent cellulitis just before Christmas was discharged home she has an open wound. She has a home [name] agency although we are not exactly sure which one. They are applying silver alginate. No compression Past medical history is reviewed she has chronic venous insufficiency and a history of left breast CA. 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 She did not have any compression on the wounds today. 2/8; substantial wound on the right posterior calf and a smaller area on the left medial calf. We are using Iodoflex to both wound areas under compression. We have better edema control We to find a new wound on the left anterior medial calf 2/15; the area on the left medial calf is closed over. Substantial area on the right posterior and lateral calf. We have been using Iodoflex although the patient complains of pain and wonders if there is not an alternative. She has home [name] changing the dressing twice a week and she sees [place] once 2/22; the wound on the left medial calf is closed over. Her edema control is not good here but she cannot get on stockings. This substantial wound is on the right lateral and right posterior calf. Changed her to a hydrofera Blue last week. Gradual improvement in wound area. 3/8; wound on the left leg remains closed. The  substantial wound is on the right lateral and right posterior calf. This is measuring smaller 3/22; 2-week follow-up. Substantial area on the right posterior and lateral calf measures slightly smaller. We are using Hydrofera Blue under compression 4/5; 2-week follow-up. Substantial area on the right posterior lateral calf. This measures slightly smaller. She has islands of epithelialization we have been using Hydrofera Blue under compression 4/19; 2-week follow-up. We have been using Hydrofera Blue however there is maceration around the wound. I will change to silver alginate today. She has islands of epithelialization however I did not see much evidence that things were improving here. 5/6; 2-week follow-up. I put silver alginate on this because of maceration around the wound last time. However she comes in today with a lot of very adherent debris on the wound of the right lateral leg. 5/17; 2-week follow-up. Using Hydrofera Blue under compression. The patient has epithelialization including islands of epithelialization in the middle of fairly substantial wound  area on the right posterior lateral calf 6/1; 2-week follow-up. Using Hydrofera Blue under compression. She seems to have increasing epithelialization. This is an irregular wound going from medial to lateral posteriorly in the right posterior calf. This would not be easy wound to get reproducible measurement 08/25/2019 upon evaluation today patient appears to actually be doing better in regard to the wound on her leg at this point. We have been using Adaptic followed by [name] this is keeping her from getting stuck. The patient is aware that this is also potentially slowing down her healing simply due to the fact that the [name] not in direct contact with the wound bed. Nonetheless she was not happy with the way it was sticking previous which is why we switched to this according to what I can read in the note and hear from the  patient and her aide with her today. 6/28; 2-week follow-up. We are following this patient for severe wound on the right lower leg secondary to chronic venous insufficiency using Hydrofera Blue under compression we are making gradual improvements. She arrives in clinic today with what looks like a contusion on the left anterior mid tibia. She says she was washing her leg and pulled some skin. However there is a fairly large wound with surrounding erythema and some swelling 7/12; 2-week follow-up. Right lower leg wound not quite as good as I thought. There is still islands of epithelialization we are using Hydrofera Blue with a contact layer I would like to see if we can get rid of the contact layer at this point. ooThe new area from last time is a lot smaller ooShe did not take the cephalexin I initially prescribed for the erythema edema around the left leg due to fears about diarrhea. She did tolerate doxycycline that we subsequently called in 7/26; we are doing 2-week follow-up. Wound measurements are about the same. Still the same fibrinous debris we have been using Hydrofera Blue. Change to Iodoflex today. 8/17; been using Iodoflex. Some improvement in the surface area of the large wound on the right posterior calf left anterior actually slightly larger. 100% [name] covered although it is less adherent 9/seven 3-week follow-up. Been using Iodoflex. We still have continued improvement in the surface area of the large wound on the right calf. The small area on the left anteriorly still is not closed. 01/14/2020 on evaluation today patient appears to be doing decently well in regard to her leg ulcer. Fortunately there is no signs of active infection at this time. With that being said the wound on the right leg laterally is still open and given her trouble. Fortunately there is no signs of active infection at this time. No fevers, chills, nausea, vomiting, or diarrhea. 11/18; I have not seen this  woman in quite some time. She told me she had Covid in September. She has chronic venous insufficiency with lymphedema and initially had wounds on her right lateral and left anterior lower legs. The left anterior leg is closed she is wearing a stocking Objective Constitutional Patient is hypertensive.. Pulse regular and within target range for patient.Marland Kitchen Respirations regular, non-labored and within target range.. Temperature is normal and within the target range for the patient.Marland Kitchen Appears in no distress. Vitals Time Taken: 3:19 PM, Height: 60 in, Weight: 130 lbs, BMI: 25.4, Temperature: 98.3 F, Pulse: 84 bpm, Respiratory Rate: 18 breaths/min, Blood Pressure: 175/80 mmHg. General Notes: Wound exam; the patient's wound is on the right lateral lower leg. 100% covered  in a tightly adherent fibrinous debris. I used a #5 curette to remove this. I was able to get [name] some healthier granulation but further debridements are going to be necessary most likely. Integumentary (Hair, Skin) Wound #6 status is Open. Original cause of wound was Gradually Appeared. The wound is located on the Right Lower Leg. The wound measures 3cm length x 3cm width x 0.2cm depth; 7.069cm^2 area and 1.414cm^3 volume. There is Fat Layer (Subcutaneous Tissue) exposed. There is no tunneling or undermining noted. There is a medium amount of serosanguineous drainage noted. The wound margin is distinct with the outline attached to the wound base. There is large (67-100%) pink granulation within the wound bed. There is a small (1-33%) amount of necrotic tissue within the wound bed including Adherent [name]. Assessment Active Problems ICD-10 Chronic venous hypertension (idiopathic) with ulcer and inflammation of right lower extremity Non-pressure chronic ulcer of other part of right lower leg with fat layer exposed Lymphedema, not elsewhere classified Procedures Wound #6 Pre-procedure diagnosis of Wound #6 is a Lymphedema located  on the Right Lower Leg . There was a Excisional Skin/Subcutaneous Tissue Debridement with a total area of 9 sq cm performed by Erin [name]., MD. With the following instrument(s): Curette to remove Viable and Non-Viable tissue/material. Material removed includes Subcutaneous Tissue, [name], Skin: Dermis, and Fibrin/Exudate after achieving pain control using Lidocaine 4% T opical Solution. A time out was conducted at 15:22, prior to the start of the procedure. A Minimum amount of bleeding was controlled with Pressure. The procedure was tolerated well with a pain level of 0 throughout and a pain level of 0 following the procedure. Post Debridement Measurements: 3cm length x 3cm width x 0.2cm depth; 1.414cm^3 volume. Character of Wound/Ulcer Post Debridement is improved. Post procedure Diagnosis Wound #6: Same as Pre-Procedure Pre-procedure diagnosis of Wound #6 is a Lymphedema located on the Right Lower Leg . There was a Three Layer Compression Therapy Procedure by [name], RN. Post procedure Diagnosis Wound #6: Same as Pre-Procedure Plan Follow-up Appointments: Return Appointment in 2 weeks. Dressing Change Frequency: Wound #6 Right Lower Leg: Change dressing three times week. - by home [name]. Home [name] to change 2x a week on week that patient has appt at wound clinic. Skin Barriers/Peri-Wound Care: Barrier cream - to excoriated areas Moisturizing lotion - both legs Wound Cleansing: May shower with protection. - use cast protector Primary Wound Dressing: Wound #6 Right Lower Leg: Iodoflex - or Iodosorb gel Secondary Dressing: Wound #6 Right Lower Leg: ABD pad Edema Control: 3 Layer Compression System - Right Lower Extremity Avoid standing for long periods of time Elevate legs to the level of the heart or above for 30 minutes daily and/or when sitting, a frequency of: - throughout the day whenever sitting Exercise regularly Home [name]: [place] skilled  nursing for wound care. - [facility]. I am going to continue with Iodoflex/ABDs under 3 layer compression to the right leg 2. 2-week follow-up likely to require mechanical debridement done as well. Electronic Signature(s) Signed: 01/29/2020 5:14:03 PM By: Linton Ham MD Entered By: Linton Ham on 01/29/2020 16:00:18 -------------------------------------------------------------------------------- SuperBill Details Patient Name: Date of Service: [name], CHA [name] 01/29/2020 Medical Record Number: [id number] Patient Account Number: [id number] Date of Birth/Sex: Treating RN: 1931/03/02 (86 y.o. Female) Erin [name] Primary Care Provider: Arsenio [name] Other Clinician: Referring Provider: Treating Provider/Extender: Erin [name] in Treatment: 41 Diagnosis Coding ICD-10 Codes Code Description [phone] Chronic venous hypertension (idiopathic)  with ulcer and inflammation of right lower extremity L97.812 Non-pressure chronic ulcer of other part of right lower leg with fat layer exposed L97.822 Non-pressure chronic ulcer of other part of left lower leg with fat layer exposed L03.116 Cellulitis of left lower limb I89.0 Lymphedema, not elsewhere classified Facility Procedures CPT4 Code: [id number] Description: 61470 - DEB SUBQ TISSUE 20 SQ CM/< ICD-10 Diagnosis Description L29.574 Non-pressure chronic ulcer of other part of right lower leg with fat layer exp Modifier: osed Quantity: 1 Physician Procedures : CPT4 Code Description Modifier [id number] 11042 - WC PHYS SUBQ TISS 20 SQ CM ICD-10 Diagnosis Description D64.383 Non-pressure chronic ulcer of other part of right lower leg with fat layer exposed Quantity: 1 Electronic Signature(s) Signed: 01/29/2020 5:14:03 PM By: Linton Ham MD Entered By: Linton Ham on 01/29/2020 16:00:28

## 2020-02-11 ENCOUNTER — Other Ambulatory Visit: Payer: Self-pay

## 2020-02-11 ENCOUNTER — Encounter (HOSPITAL_BASED_OUTPATIENT_CLINIC_OR_DEPARTMENT_OTHER): Payer: Medicare HMO | Attending: Physician Assistant | Admitting: Physician Assistant

## 2020-02-11 DIAGNOSIS — Z853 Personal history of malignant neoplasm of breast: Secondary | ICD-10-CM | POA: Diagnosis not present

## 2020-02-11 DIAGNOSIS — I872 Venous insufficiency (chronic) (peripheral): Secondary | ICD-10-CM | POA: Diagnosis not present

## 2020-02-11 DIAGNOSIS — Z8616 Personal history of COVID-19: Secondary | ICD-10-CM | POA: Diagnosis not present

## 2020-02-11 DIAGNOSIS — L97812 Non-pressure chronic ulcer of other part of right lower leg with fat layer exposed: Secondary | ICD-10-CM | POA: Insufficient documentation

## 2020-02-11 DIAGNOSIS — Z8614 Personal history of Methicillin resistant Staphylococcus aureus infection: Secondary | ICD-10-CM | POA: Diagnosis not present

## 2020-02-11 DIAGNOSIS — I890 Lymphedema, not elsewhere classified: Secondary | ICD-10-CM | POA: Diagnosis not present

## 2020-02-12 NOTE — Progress Notes (Addendum)
[name], [name] ([id number]) Visit Report for 02/11/2020 Chief Complaint Document Details Patient Name: Date of Service: Erin [name] [name] 02/11/2020 1:45 PM Medical Record Number: [id number] Patient Account Number: [id number] Date of Birth/Sex: Treating RN: [birth year] (86 y.o. [name] Primary Care Provider: [name] Other Clinician: Referring Provider: Treating Provider/Extender: [name] in Treatment: 66 Information Obtained from: Patient Chief Complaint 03/22/17; patient is here for review of wounds on her bilateral legs Electronic Signature(s) Signed: 02/11/2020 2:00:05 PM By: Worthy Keeler PA-C Entered By: Worthy Keeler on 02/11/2020 14:00:05 -------------------------------------------------------------------------------- Problem List Details Patient Name: Date of Service: Erin [name] [name] 02/11/2020 1:45 PM Medical Record Number: [id number] Patient Account Number: [id number] Date of Birth/Sex: Treating RN: [birth year] (86 y.o. [name] Primary Care Provider: [name] Other Clinician: Referring Provider: Treating Provider/Extender: [name] in Treatment: 75 Active Problems ICD-10 Encounter Code Description Active Date MDM Diagnosis I87.331 Chronic venous hypertension (idiopathic) with ulcer and inflammation of right 04/14/2019 No Yes lower extremity L97.812 Non-pressure chronic ulcer of other part of right lower leg with fat layer 04/14/2019 No Yes exposed I89.0 Lymphedema, not elsewhere classified 04/14/2019 No Yes Inactive Problems ICD-10 Code Description Active Date Inactive Date L97.822 Non-pressure chronic ulcer of other part of left lower leg with fat layer exposed 09/08/2019 09/08/2019 L97.821 Non-pressure chronic ulcer of other part of left lower leg limited to breakdown of skin 04/21/2019 04/21/2019 L03.116 Cellulitis of left lower limb 09/08/2019 09/08/2019 Resolved  Problems Electronic Signature(s) Signed: 02/11/2020 1:59:58 PM By: Worthy Keeler PA-C Entered By: Worthy Keeler on 02/11/2020 13:59:58

## 2020-02-13 NOTE — Progress Notes (Signed)
Erin [name] ([id number]) Visit Report for 02/11/2020 Arrival Information Details Patient Name: Date of Service: Erin [name] Erin TTE A. 02/11/2020 1:45 PM Medical Record Number: [id number] Patient Account Number: [id number] Date of Birth/Sex: Treating RN: 12/03/1930 (86 y.o. [name] Primary Care [name]: [name] Other Clinician: Referring [name]: Treating [name]/Extender: [name] in Treatment: 60 Visit Information History Since Last Visit Added or deleted any medications: No Patient Arrived: Wheel Chair Any new allergies or adverse reactions: No Arrival Time: 14:23 Had a fall or experienced change in No Accompanied By: friend activities of daily living that may affect Transfer Assistance: None risk of falls: Patient Identification Verified: Yes Signs or symptoms of abuse/neglect since last visito No Secondary Verification Process Completed: Yes Hospitalized since last visit: No Patient Requires Transmission-Based Precautions: No Implantable device outside of the clinic excluding No Patient Has Alerts: Yes cellular tissue based products placed in the center Patient Alerts: NON COMPRESSABLE since last visit: Has Dressing in Place as Prescribed: Yes Has Compression in Place as Prescribed: Yes Pain Present Now: No Electronic Signature(s) Signed: 02/13/2020 4:47:23 PM By: [name] RN, BSN Entered By: [name] on 02/11/2020 14:24:07 -------------------------------------------------------------------------------- Compression Therapy Details Patient Name: Date of Service: Erin [name] Erin TTE A. 02/11/2020 1:45 PM Medical Record Number: [id number] Patient Account Number: [id number] Date of Birth/Sex: Treating RN: [birth year] (86 y.o. [name] Primary Care [name]: [name] Other Clinician: Referring [name]: Treating [name]/Extender: [name] in Treatment:  57 Compression Therapy Performed for Wound Assessment: Wound #6 Right Lower Leg Performed By: Clinician [name], RN Compression Type: Three Layer Post Procedure Diagnosis Same as Pre-procedure Electronic Signature(s) Signed: 02/11/2020 5:21:24 PM By: [name] RN, BSN Entered By: [name] on 02/11/2020 15:08:28 -------------------------------------------------------------------------------- Encounter Discharge Information Details Patient Name: Date of Service: Erin [name] Erin TTE A. 02/11/2020 1:45 PM Medical Record Number: [id number] Patient Account Number: [id number] Date of Birth/Sex: Treating RN: 06/18/1930 (86 y.o. [name] Primary Care Eleonora Peeler: [name] Other Clinician: Referring Jerimyah Vandunk: Treating [name]/Extender: [name] in Treatment: 38 Encounter Discharge Information Items Post Procedure Vitals Discharge Condition: Stable Temperature (F): 97.9 Ambulatory Status: Wheelchair Pulse (bpm): 71 Discharge Destination: Home Respiratory Rate (breaths/min): 18 Transportation: Private Auto Blood Pressure (mmHg): 175/88 Accompanied By: alone Schedule Follow-up Appointment: Yes Clinical Summary of Care: Patient Declined Electronic Signature(s) Signed: 02/13/2020 4:47:23 PM By: [name] RN, BSN Entered By: [name] on 02/11/2020 16:41:38 -------------------------------------------------------------------------------- Lower Extremity Assessment Details Patient Name: Date of Service: Erin [name] Erin TTE A. 02/11/2020 1:45 PM Medical Record Number: [id number] Patient Account Number: [id number] Date of Birth/Sex: Treating RN: Apr 08, 1930 (86 y.o. [name] Primary Care Kamiyah Kindel: [name] Other Clinician: Referring Yarisbel Miranda: Treating [name]/Extender: [name] in Treatment: 43 Edema Assessment Assessed: [Left: No] [Right: No] Edema: [Left: Yes] [Right:  No] Calf Left: Right: Point of Measurement: 35 cm From Medial Instep 27 cm Ankle Left: Right: Point of Measurement: 7 cm From Medial Instep 20.5 cm Vascular Assessment Pulses: Dorsalis Pedis Palpable: [Right:Yes] Electronic Signature(s) Signed: 02/13/2020 4:47:23 PM By: [name] RN, BSN Entered By: [name] on 02/11/2020 14:34:50 -------------------------------------------------------------------------------- Multi-Disciplinary Care Plan Details Patient Name: Date of Service: Erin [name] Erin TTE A. 02/11/2020 1:45 PM Medical Record Number: [id number] Patient Account Number: [id number] Date of Birth/Sex: Treating RN: 1931/01/07 (86 y.o. [name] Primary Care Anistyn Graddy: [name] Other Clinician: Referring [name]: Treating  [name]/Extender: [name] in Treatment: [phone] Active Inactive Venous Leg Ulcer Nursing Diagnoses: Actual venous Insuffiency (use after diagnosis is confirmed) Knowledge deficit related to disease process and management Goals: Patient will maintain optimal edema control Date Initiated: 04/14/2019 Target Resolution Date: 03/11/2020 Goal Status: Active Patient/caregiver will verbalize understanding of disease process and disease management Date Initiated: 04/14/2019 Date Inactivated: 05/19/2019 Target Resolution Date: 05/16/2019 Goal Status: Met Interventions: Assess peripheral edema status every visit. Compression as ordered Provide education on venous insufficiency Notes: Wound/Skin Impairment Nursing Diagnoses: Impaired tissue integrity Knowledge deficit related to ulceration/compromised skin integrity Goals: Patient/caregiver will verbalize understanding of skin care regimen Date Initiated: 04/14/2019 Target Resolution Date: 03/10/2020 Goal Status: Active Ulcer/skin breakdown will have a volume reduction of 30% by week 4 Date Initiated: 04/14/2019 Date Inactivated: 05/19/2019 Target Resolution Date:  05/16/2019 Goal Status: Met Ulcer/skin breakdown will have a volume reduction of 50% by week 8 Date Initiated: 05/19/2019 Date Inactivated: 06/16/2019 Target Resolution Date: 06/20/2019 Goal Status: Met Interventions: Assess patient/caregiver ability to obtain necessary supplies Assess patient/caregiver ability to perform ulcer/skin care regimen upon admission and as needed Assess ulceration(s) every visit Provide education on ulcer and skin care Notes: Electronic Signature(s) Signed: 02/11/2020 5:21:24 PM By: [name] RN, BSN Entered By: [name] on 02/11/2020 15:07:16 -------------------------------------------------------------------------------- Pain Assessment Details Patient Name: Date of Service: Erin [name] Erin TTE A. 02/11/2020 1:45 PM Medical Record Number: [id number] Patient Account Number: [id number] Date of Birth/Sex: Treating RN: June 16, 1930 (86 y.o. [name] Primary Care [place]: [name] Other Clinician: Referring Aljean Horiuchi: Treating Xuan Mateus/Extender: [name] in Treatment: 82 Active Problems Location of Pain Severity and Description of Pain Patient Has Paino No Site Locations Pain Management and Medication Current Pain Management: Electronic Signature(s) Signed: 02/13/2020 4:47:23 PM By: [name] RN, BSN Entered By: [name] on 02/11/2020 14:24:32 -------------------------------------------------------------------------------- Patient/Caregiver Education Details Patient Name: Date of Service: Erin [name] Erin TTE A. 12/1/2021andnbsp1:45 PM Medical Record Number: [id number] Patient Account Number: [id number] Date of Birth/Gender: Treating RN: [birth year] (86 y.o. [name] Primary Care Physician: [name] Other Clinician: Referring Physician: Treating Physician/Extender: [name] in Treatment: 65 Education Assessment Education Provided  To: Patient Education Topics Provided Venous: Methods: Explain/Verbal Responses: Reinforcements needed, State content correctly Wound/Skin Impairment: Methods: Explain/Verbal Responses: Reinforcements needed, State content correctly Electronic Signature(s) Signed: 02/11/2020 5:21:24 PM By: [name] RN, BSN Entered By: [name] on 02/11/2020 15:08:08 -------------------------------------------------------------------------------- Wound Assessment Details Patient Name: Date of Service: Erin [name] Erin TTE A. 02/11/2020 1:45 PM Medical Record Number: [id number] Patient Account Number: [id number] Date of Birth/Sex: Treating RN: 1930-03-17 (86 y.o. [name] Primary Care Tineka Uriegas: [name] Other Clinician: Referring Meigan Pates: Treating Destiney Sanabia/Extender: [name] in Treatment: 43 Wound Status Wound Number: 6 Primary Lymphedema Etiology: Wound Location: Right Lower Leg Wound Status: Open Wounding Event: Gradually Appeared Comorbid Cataracts, Glaucoma, Middle ear problems, Anemia, Date Acquired: 03/14/2019 History: Hypertension Weeks Of Treatment: 43 Clustered Wound: No Wound Measurements Length: (cm) 2.4 Width: (cm) 3 Depth: (cm) 0.2 Area: (cm) 5.655 Volume: (cm) 1.131 % Reduction in Area: 95.8% % Reduction in Volume: 97.9% Epithelialization: Medium (34-66%) Tunneling: No Undermining: No Wound Description Classification: Full Thickness Without Exposed Support Structures Wound Margin: Distinct, outline attached Exudate Amount: Medium Exudate Type: Serosanguineous Exudate Color: red, brown Foul Odor After Cleansing: No [name]/Fibrino Yes Wound Bed Granulation Amount: Large (67-100%) Exposed Structure Granulation Quality: Pink, Pale Fascia Exposed:  No Necrotic Amount: Small (1-33%) Fat Layer (Subcutaneous Tissue) Exposed: Yes Necrotic Quality: Adherent [name] Tendon Exposed: No Muscle Exposed: No Joint  Exposed: No Bone Exposed: No Treatment Notes Wound #6 (Right Lower Leg) 1. Cleanse With Wound Cleanser 2. Periwound Care Barrier cream Moisturizing lotion 3. Primary Dressing Applied Colle[employer] 4. Secondary Dressing Dry Gauze 6. Support Layer Applied 3 layer compression wrap Notes netting Electronic Signature(s) Signed: 02/13/2020 4:47:23 PM By: [name] RN, BSN Entered By: [name] on 02/11/2020 14:34:29 -------------------------------------------------------------------------------- Vitals Details Patient Name: Date of Service: Erin [name], Erin [name] Erin TTE A. 02/11/2020 1:45 PM Medical Record Number: [id number] Patient Account Number: [id number] Date of Birth/Sex: Treating RN: [birth year] (86 y.o. [name] Primary Care Arber Wiemers: [name] Other Clinician: Referring Zeth Buday: Treating Jasia Hiltunen/Extender: [name] in Treatment: 23 Vital Signs Time Taken: 14:24 Temperature (F): 97.9 Height (in): 60 Pulse (bpm): 71 Weight (lbs): 130 Respiratory Rate (breaths/min): 18 Body Mass Index (BMI): 25.4 Blood Pressure (mmHg): 175/88 Reference Range: 80 - 120 mg / dl Electronic Signature(s) Signed: 02/13/2020 4:47:23 PM By: [name] RN, BSN Entered By: [name] on 02/11/2020 14:24:26

## 2020-02-25 ENCOUNTER — Encounter (HOSPITAL_BASED_OUTPATIENT_CLINIC_OR_DEPARTMENT_OTHER): Payer: Medicare HMO | Admitting: Physician Assistant

## 2020-02-25 ENCOUNTER — Other Ambulatory Visit: Payer: Self-pay

## 2020-02-25 DIAGNOSIS — I872 Venous insufficiency (chronic) (peripheral): Secondary | ICD-10-CM | POA: Diagnosis not present

## 2020-02-25 NOTE — Progress Notes (Signed)
CONSUELLA, SCURLOCK ([id number]) Visit Report for 02/25/2020 Arrival Information Details Patient Name: Date of Service: Lindaann Slough [name] 02/25/2020 2:15 PM Medical Record Number: [id number] Patient Account Number: [id number] Date of Birth/Sex: Treating RN: Aug 04, 1930 (86 y.o. [name], Tammi Klippel Primary Care Sophiamarie Nease: [name] Other Clinician: Referring Marigrace Mccole: Treating Imogean Ciampa/Extender: [name] in Treatment: 53 Visit Information History Since Last Visit Added or deleted any medications: No Patient Arrived: Wheel Chair Any new allergies or adverse reactions: No Arrival Time: 14:49 Had a fall or experienced change in No Accompanied By: caregiver activities of daily living that may affect Transfer Assistance: None risk of falls: Patient Identification Verified: Yes Signs or symptoms of abuse/neglect since last visito No Secondary Verification Process Completed: Yes Hospitalized since last visit: No Patient Requires Transmission-Based Precautions: No Implantable device outside of the clinic excluding No Patient Has Alerts: Yes cellular tissue based products placed in the center Patient Alerts: NON COMPRESSABLE since last visit: Has Dressing in Place as Prescribed: Yes Has Compression in Place as Prescribed: Yes Pain Present Now: No Electronic Signature(s) Signed: 02/25/2020 5:09:50 PM By: Deon Pilling Entered By: Deon Pilling on 02/25/2020 14:53:24 -------------------------------------------------------------------------------- Compression Therapy Details Patient Name: Date of Service: Lindaann Slough [name] 02/25/2020 2:15 PM Medical Record Number: [id number] Patient Account Number: [id number] Date of Birth/Sex: Treating RN: 05/03/1930 (86 y.o. [name] Primary Care Nashid Pellum: [name] Other Clinician: Referring Katyana Trolinger: Treating Hermione Havlicek/Extender: [name] in Treatment: 45 Compression  Therapy Performed for Wound Assessment: Wound #6 Right Lower Leg Performed By: Clinician [name], RN Compression Type: Three Layer Post Procedure Diagnosis Same as Pre-procedure Electronic Signature(s) Signed: 02/25/2020 5:29:15 PM By: [name] RN, BSN Entered By: [name] on 02/25/2020 15:39:12 -------------------------------------------------------------------------------- Encounter Discharge Information Details Patient Name: Date of Service: Lindaann Slough [name] 02/25/2020 2:15 PM Medical Record Number: [id number] Patient Account Number: [id number] Date of Birth/Sex: Treating RN: [birth year] (86 y.o. [name] Primary Care Lilyauna Miedema: [name] Other Clinician: Referring Bradie Lacock: Treating Jayelle Page/Extender: [name] in Treatment: [phone] Encounter Discharge Information Items Discharge Condition: Stable Ambulatory Status: Wheelchair Discharge Destination: Home Transportation: Private Auto Accompanied By: self Schedule Follow-up Appointment: Yes Clinical Summary of Care: Patient Declined Electronic Signature(s) Signed: 02/25/2020 5:05:31 PM By: [name] RN Entered By: [name] on 02/25/2020 15:57:02 -------------------------------------------------------------------------------- Lower Extremity Assessment Details Patient Name: Date of Service: Lindaann Slough [name] 02/25/2020 2:15 PM Medical Record Number: [id number] Patient Account Number: [id number] Date of Birth/Sex: Treating RN: 08-Jun-1930 (86 y.o. [name], Tammi Klippel Primary Care Aviendha Azbell: [name] Other Clinician: Referring Keontay Vora: Treating Danique Hartsough/Extender: [name] in Treatment: 45 Edema Assessment Assessed: [Left: No] [Right: Yes] Edema: [Left: Yes] [Right: No] Calf Left: Right: Point of Measurement: 35 cm From Medial Instep 28.5 cm Ankle Left: Right: Point of Measurement: 7 cm From Medial Instep 20.5  cm Vascular Assessment Pulses: Dorsalis Pedis Palpable: [Right:Yes] Electronic Signature(s) Signed: 02/25/2020 5:09:50 PM By: Deon Pilling Entered By: Deon Pilling on 02/25/2020 14:54:09 -------------------------------------------------------------------------------- [place] Details Patient Name: Date of Service: Lindaann Slough [name] 02/25/2020 2:15 PM Medical Record Number: [id number] Patient Account Number: [id number] Date of Birth/Sex: Treating RN: December 04, 1930 (86 y.o. [name] Primary Care Gadiel John: [name] Other Clinician: Referring Cai Flott: Treating Jerre Diguglielmo/Extender: [name] in Treatment: 45 Active Inactive Venous Leg Ulcer Nursing Diagnoses: Actual venous Insuffiency (use after  diagnosis is confirmed) Knowledge deficit related to disease process and management Goals: Patient will maintain optimal edema control Date Initiated: 04/14/2019 Target Resolution Date: 03/11/2020 Goal Status: Active Patient/caregiver will verbalize understanding of disease process and disease management Date Initiated: 04/14/2019 Date Inactivated: 05/19/2019 Target Resolution Date: 05/16/2019 Goal Status: Met Interventions: Assess peripheral edema status every visit. Compression as ordered Provide education on venous insufficiency Notes: Wound/Skin Impairment Nursing Diagnoses: Impaired tissue integrity Knowledge deficit related to ulceration/compromised skin integrity Goals: Patient/caregiver will verbalize understanding of skin care regimen Date Initiated: 04/14/2019 Target Resolution Date: 03/10/2020 Goal Status: Active Ulcer/skin breakdown will have a volume reduction of 30% by week 4 Date Initiated: 04/14/2019 Date Inactivated: 05/19/2019 Target Resolution Date: 05/16/2019 Goal Status: Met Ulcer/skin breakdown will have a volume reduction of 50% by week 8 Date Initiated: 05/19/2019 Date Inactivated: 06/16/2019 Target  Resolution Date: 06/20/2019 Goal Status: Met Interventions: Assess patient/caregiver ability to obtain necessary supplies Assess patient/caregiver ability to perform ulcer/skin care regimen upon admission and as needed Assess ulceration(s) every visit Provide education on ulcer and skin care Notes: Electronic Signature(s) Signed: 02/25/2020 5:29:15 PM By: [name] RN, BSN Entered By: [name] on 02/25/2020 15:38:09 -------------------------------------------------------------------------------- Pain Assessment Details Patient Name: Date of Service: Lindaann Slough [name] 02/25/2020 2:15 PM Medical Record Number: [id number] Patient Account Number: [id number] Date of Birth/Sex: Treating RN: Jul 07, 1930 (86 y.o. [name] Primary Care Ayan Yankey: [name] Other Clinician: Referring Vernadine Coombs: Treating Shauntavia Brackin/Extender: [name] in Treatment: 45 Active Problems Location of Pain Severity and Description of Pain Patient Has Paino No Site Locations Rate the pain. Current Pain Level: 0 Pain Management and Medication Current Pain Management: Medication: No Cold Application: No Rest: No Massage: No Activity: No T.E.N.S.: No Heat Application: No Leg drop or elevation: No Is the Current Pain Management Adequate: Adequate How does your wound impact your activities of daily livingo Sleep: No Bathing: No Appetite: No Relationship With Others: No Bladder Continence: No Emotions: No Bowel Continence: No Work: No Toileting: No Drive: No Dressing: No Hobbies: No Electronic Signature(s) Signed: 02/25/2020 5:09:50 PM By: Deon Pilling Entered By: Deon Pilling on 02/25/2020 14:53:56 -------------------------------------------------------------------------------- Patient/Caregiver Education Details Patient Name: Date of Service: Lindaann Slough [name] 12/15/2021andnbsp2:15 PM Medical Record Number: [id number] Patient Account  Number: [id number] Date of Birth/Gender: Treating RN: 02/08/1931 (86 y.o. [name] Primary Care Physician: [name] Other Clinician: Referring Physician: Treating Physician/Extender: [name] in Treatment: 58 Education Assessment Education Provided To: Patient Education Topics Provided Venous: Methods: Explain/Verbal Responses: Reinforcements needed, State content correctly Electronic Signature(s) Signed: 02/25/2020 5:29:15 PM By: [name] RN, BSN Entered By: [name] on 02/25/2020 15:38:49 -------------------------------------------------------------------------------- Wound Assessment Details Patient Name: Date of Service: Lindaann Slough [name] 02/25/2020 2:15 PM Medical Record Number: [id number] Patient Account Number: [id number] Date of Birth/Sex: Treating RN: 12/31/1930 (86 y.o. [name], Tammi Klippel Primary Care Shizue Kaseman: [name] Other Clinician: Referring Shawntina Diffee: Treating Denetria Luevanos/Extender: [name] in Treatment: 45 Wound Status Wound Number: 6 Primary Lymphedema Etiology: Wound Location: Right Lower Leg Wound Status: Open Wounding Event: Gradually Appeared Comorbid Cataracts, Glaucoma, Middle ear problems, Anemia, Date Acquired: 03/14/2019 History: Hypertension Weeks Of Treatment: 45 Clustered Wound: No Wound Measurements Length: (cm) 2 Width: (cm) 3 Depth: (cm) 0.1 Area: (cm) 4.712 Volume: (cm) 0.471 % Reduction in Area: 96.5% % Reduction in Volume: 99.1% Epithelialization: Medium (34-66%) Tunneling: No Undermining: No Wound Description Classification: Full Thickness Without  Exposed Support Structures Wound Margin: Distinct, outline attached Exudate Amount: Medium Exudate Type: Serosanguineous Exudate Color: red, brown Foul Odor After Cleansing: No Slough/Fibrino Yes Wound Bed Granulation Amount: Large (67-100%) Exposed Structure Granulation Quality: Pink,  Pale Fascia Exposed: No Necrotic Amount: Small (1-33%) Fat Layer (Subcutaneous Tissue) Exposed: Yes Necrotic Quality: Adherent Slough Tendon Exposed: No Muscle Exposed: No Joint Exposed: No Bone Exposed: No Treatment Notes Wound #6 (Lower Leg) Wound Laterality: Right Cleanser Wound Cleanser Discharge Instruction: Cleanse the wound with wound cleanser prior to applying a clean dressing using gauze sponges, not tissue or cotton balls. Peri-Wound Care Sween Lotion (Moisturizing lotion) Discharge Instruction: Apply moisturizing lotion as directed Topical Primary Dressing Promogran Prisma Matrix, 4.34 (sq in) (silver collagen) Discharge Instruction: Moisten collagen with saline or hydrogel Secondary Dressing Woven Gauze Sponge, Non-Sterile 4x4 in Discharge Instruction: Apply over primary dressing as directed. Secured With Compression Wrap ThreePress (3 layer compression wrap) Discharge Instruction: Apply three layer compression as directed. Compression Stockings Add-Ons Electronic Signature(s) Signed: 02/25/2020 5:09:50 PM By: Deon Pilling Entered By: Deon Pilling on 02/25/2020 14:54:29 -------------------------------------------------------------------------------- Vitals Details Patient Name: Date of Service: [name], CHA [name] 02/25/2020 2:15 PM Medical Record Number: [id number] Patient Account Number: [id number] Date of Birth/Sex: Treating RN: [birth year] (86 y.o. [name], Tammi Klippel Primary Care Lola Czerwonka: [name] Other Clinician: Referring Kutter Schnepf: Treating Antolin Belsito/Extender: [name] in Treatment: 45 Vital Signs Time Taken: 14:49 Temperature (F): 97.6 Height (in): 60 Pulse (bpm): 80 Weight (lbs): 130 Respiratory Rate (breaths/min): 16 Body Mass Index (BMI): 25.4 Blood Pressure (mmHg): 165/84 Reference Range: 80 - 120 mg / dl Electronic Signature(s) Signed: 02/25/2020 5:09:50 PM By: Deon Pilling Entered By: Deon Pilling on 02/25/2020 14:53:47

## 2020-02-25 NOTE — Progress Notes (Addendum)
MAHNOOR, MATHISEN ([id number]) Visit Report for 02/25/2020 Chief Complaint Document Details Patient Name: Date of Service: Lindaann Slough [name] 02/25/2020 2:15 PM Medical Record Number: [id number] Patient Account Number: [id number] Date of Birth/Sex: Treating RN: 1931/01/31 (86 y.o. [name] Primary Care Provider: [name] Other Clinician: Referring Provider: Treating Provider/Extender: [name] in Treatment: 72 Information Obtained from: Patient Chief Complaint 03/22/17; patient is here for review of wounds on her bilateral legs Electronic Signature(s) Signed: 02/25/2020 2:56:26 PM By: Worthy Keeler PA-C Entered By: Worthy Keeler on 02/25/2020 14:56:25 -------------------------------------------------------------------------------- HPI Details Patient Name: Date of Service: Lindaann Slough [name] 02/25/2020 2:15 PM Medical Record Number: [id number] Patient Account Number: [id number] Date of Birth/Sex: Treating RN: September 15, 1930 (86 y.o. [name] Primary Care Provider: [name] Other Clinician: Referring Provider: Treating Provider/Extender: [name] in Treatment: 59 History of Present Illness HPI Description: We to find a new wound on the left 03/22/17; this is an 86 year old woman. There is not a lot of information in care everywhere on this patient. She had a history of a malignant neoplasm her left breast for which she had lumpectomy but she did not have radiation. Apparently she has had long- standing edema and her lower legs and she was seen in the wound care center at [facility] in [place] by [name] October. Both her legs were wrapped however it sounds as though the left leg became secondarily infected she apparently had MRSA and she was admitted the hospital. Not really turn for wound care. She lives at home on her own and has apparently [facility] home health care. We are not  exactly clear what [facility] is doing to her legs but it does not involve compression. She is not a diabetic. ABIs in our clinic were noncompressible bilaterally 03/30/17; this is a patient who has bilateral chronic venous insufficiency, severe venous inflammation. We had a nice description number legs from a note by [name] dating 2010 describing edematous warm legs with erythema. This suggests that she has chronic venous insufficiency with inflammation.. We have arterial studies dated 12/29/16 again from [facility]. This showed a right ABI at 1 and a TBI of 0.81 on the left the ABI was 1.25 and a TBI of 0.72. This was just she does not have significant arterial insufficiency. She had normal triphasic waveforms noted that she was admitted to hospital in late October with MSSA cellulitis Last visit we wrapped both her legs. She had an open area predominantly on the left anterior but weeping edema on the right anterior leg. She has done well there is only one at anterior left tibial wound 04/06/17; the patient has no open area on her right leg. Her remaining wound is on the left anterior leg, the left posterior leg wound is healed. We have been using silver alginate changed to [facility] on the left leg today she has home health 04/13/17; still no open area on her right leg. Her remaining wound on the left anterior leg. We have been using using Hydrofera Blue 04/20/17; no open area on the right leg although the edema here is somewhat disfiguring. Her remaining wound is on the left anterior leg we've been using Hydrofera Blue with improvement in dimensions. She has [facility] home health and lives in [place]. She requests to come every 2 weeks because of the distance involved in coming here 05/04/17; there is no open area  on the right leg. She has a very small left anterior leg wound that remains. However what remains on the left still has some depth and a not to viable looking  surface. It is too small to really attempt debridement. She has home health coming out to her home in [facility] 05/18/17; there is no open area on either leg. She has significant chronic venous insufficiency. The left anterior leg wound that was still open 2 weeks ago has closed. She has home health coming out. We are going to order Juxtalite stockings for both legs. She is agreed to pay for these privately. We will order them from [facility] READMISSION 04/14/2019 This is a patient we previously had in this clinic in 2019 for 3 months. She had a wound on the left anterior greater than right anterior leg. According to my notes we discharged her and juxta lite stockings although it does not appear that she ever got them. The history here is a bit difficult. She apparently has had a wound on the left lower leg for the last 6 months. She required admission to hospital in [facility] for apparent cellulitis just before Christmas was discharged home she has an open wound. She has a home health agency although we are not exactly sure which one. They are applying silver alginate. No compression Past medical history is reviewed she has chronic venous insufficiency and a history of left breast CA. 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 She did not have any compression on the wounds today. 2/8; substantial wound on the right posterior calf and a smaller area on the left medial calf. We are using Iodoflex to both wound areas under compression. We have better edema control We to find a new wound on the left anterior medial calf 2/15; the area on the left medial calf is closed over. Substantial area on the right posterior and lateral calf. We have been using Iodoflex although the patient complains of pain and wonders if there is not an alternative. She has home health changing the dressing twice a  week and she sees [place] once 2/22; the wound on the left medial calf is closed over. Her edema control is not good here but she cannot get on stockings. This substantial wound is on the right lateral and right posterior calf. Changed her to a hydrofera Blue last week. Gradual improvement in wound area. 3/8; wound on the left leg remains closed. The substantial wound is on the right lateral and right posterior calf. This is measuring smaller 3/22; 2-week follow-up. Substantial area on the right posterior and lateral calf measures slightly smaller. We are using Hydrofera Blue under compression 4/5; 2-week follow-up. Substantial area on the right posterior lateral calf. This measures slightly smaller. She has islands of epithelialization we have been using Hydrofera Blue under compression 4/19; 2-week follow-up. We have been using Hydrofera Blue however there is maceration around the wound. I will change to silver alginate today. She has islands of epithelialization however I did not see much evidence that things were improving here. 5/6; 2-week follow-up. I put silver alginate on this because of maceration around the wound last time. However she comes in today with a lot of very adherent debris on the wound of the right lateral leg. 5/17; 2-week follow-up. Using Hydrofera Blue under compression. The patient has epithelialization including islands of  epithelialization in the middle of fairly substantial wound area on the right posterior lateral calf 6/1; 2-week follow-up. Using Hydrofera Blue under compression. She seems to have increasing epithelialization. This is an irregular wound going from medial to lateral posteriorly in the right posterior calf. This would not be easy wound to get reproducible measurement 08/25/2019 upon evaluation today patient appears to actually be doing better in regard to the wound on her leg at this point. We have been using Adaptic followed by [facility] this is keeping  her from getting stuck. The patient is aware that this is also potentially slowing down her healing simply due to the fact that the [facility] not in direct contact with the wound bed. Nonetheless she was not happy with the way it was sticking previous which is why we switched to this according to what I can read in the note and hear from the patient and her aide with her today. 6/28; 2-week follow-up. We are following this patient for severe wound on the right lower leg secondary to chronic venous insufficiency using Hydrofera Blue under compression we are making gradual improvements. She arrives in clinic today with what looks like a contusion on the left anterior mid tibia. She says she was washing her leg and pulled some skin. However there is a fairly large wound with surrounding erythema and some swelling 7/12; 2-week follow-up. Right lower leg wound not quite as good as I thought. There is still islands of epithelialization we are using Hydrofera Blue with a contact layer I would like to see if we can get rid of the contact layer at this point. The new area from last time is a lot smaller She did not take the cephalexin I initially prescribed for the erythema edema around the left leg due to fears about diarrhea. She did tolerate doxycycline that we subsequently called in 7/26; we are doing 2-week follow-up. Wound measurements are about the same. Still the same fibrinous debris we have been using Hydrofera Blue. Change to Iodoflex today. 8/17; been using Iodoflex. Some improvement in the surface area of the large wound on the right posterior calf left anterior actually slightly larger. 100% slough covered although it is less adherent 9/seven 3-week follow-up. Been using Iodoflex. We still have continued improvement in the surface area of the large wound on the right calf. The small area on the left anteriorly still is not closed. 01/14/2020 on evaluation today patient appears to be doing  decently well in regard to her leg ulcer. Fortunately there is no signs of active infection at this time. With that being said the wound on the right leg laterally is still open and given her trouble. Fortunately there is no signs of active infection at this time. No fevers, chills, nausea, vomiting, or diarrhea. 11/18; I have not seen this woman in quite some time. She told me she had Covid in September. She has chronic venous insufficiency with lymphedema and initially had wounds on her right lateral and left anterior lower legs. The left anterior leg is closed she is wearing a stocking 02/11/2020 upon evaluation today patient appears to be doing well with regard to her leg ulcer. This appears to be somewhat dry which is good although I think the Iodoflex is probably keeping this much too dry at this point. I think it may be time to switch to something different since it has accomplish the goal and we were kind of looking for. Fortunately there is no signs of  active infection at this time. 02/25/2020 on evaluation today patient appears to be doing well with regard to her wound. She has been tolerating the dressing changes without complication. Fortunately there is no signs of active infection at this time. No fevers, chills, nausea, vomiting, or diarrhea. Electronic Signature(s) Signed: 02/25/2020 3:54:10 PM By: Worthy Keeler PA-C Entered By: Worthy Keeler on 02/25/2020 15:54:10 -------------------------------------------------------------------------------- Physical Exam Details Patient Name: Date of Service: Lindaann Slough [name] 02/25/2020 2:15 PM Medical Record Number: [id number] Patient Account Number: [id number] Date of Birth/Sex: Treating RN: 11/12/1930 (86 y.o. [name] Primary Care Provider: [name] Other Clinician: Referring Provider: Treating Provider/Extender: [name] in Treatment: 71 Constitutional Well-nourished and  well-hydrated in no acute distress. Respiratory normal breathing without difficulty. Psychiatric this patient is able to make decisions and demonstrates good insight into disease process. Alert and Oriented x 3. pleasant and cooperative. Notes Patient's wound bed showed signs of good granulation epithelization I do feel like she is making progress this is somewhat slow but nonetheless progress. Electronic Signature(s) Signed: 02/25/2020 3:54:30 PM By: Worthy Keeler PA-C Entered By: Worthy Keeler on 02/25/2020 15:54:30 -------------------------------------------------------------------------------- Physician Orders Details Patient Name: Date of Service: Lindaann Slough [name] 02/25/2020 2:15 PM Medical Record Number: [id number] Patient Account Number: [id number] Date of Birth/Sex: Treating RN: Jan 06, 1931 (86 y.o. [name] Primary Care Provider: [name] Other Clinician: Referring Provider: Treating Provider/Extender: [name] in Treatment: 33 Verbal / Phone Orders: No Diagnosis Coding ICD-10 Coding Code Description I87.331 Chronic venous hypertension (idiopathic) with ulcer and inflammation of right lower extremity L97.812 Non-pressure chronic ulcer of other part of right lower leg with fat layer exposed I89.0 Lymphedema, not elsewhere classified Follow-up Appointments Return appointment in 3 weeks. Bathing/ Shower/ Hygiene May shower with protection but do not get wound dressing(s) wet. Edema Control - Lymphedema / SCD / Other Bilateral Lower Extremities Elevate legs to the level of the heart or above for 30 minutes daily and/or when sitting, a frequency of: Avoid standing for long periods of time. Exercise regularly [place] wound care orders this week; continue Home Health for wound care. May utilize formulary equivalent dressing for wound treatment orders unless otherwise specified. - decrease dressing changes to 2 times  per week Other Home Health Orders/Instructions: - [facility] Wound Treatment Wound #6 - Lower Leg Wound Laterality: Right Cleanser: Wound Cleanser (Home Health) 2 x Per Week/30 Days Discharge Instructions: Cleanse the wound with wound cleanser prior to applying a clean dressing using gauze sponges, not tissue or cotton balls. Peri-Wound Care: Sween Lotion (Moisturizing lotion) (Home Health) 2 x Per Week/30 Days Discharge Instructions: Apply moisturizing lotion as directed Prim Dressing: Promogran Prisma Matrix, 4.34 (sq in) (silver collagen) (Home Health) 2 x Per Week/30 Days ary Discharge Instructions: Moisten collagen with saline or hydrogel Secondary Dressing: Woven Gauze Sponge, Non-Sterile 4x4 in (Home Health) 2 x Per Week/30 Days Discharge Instructions: Apply over primary dressing as directed. Compression Wrap: ThreePress (3 layer compression wrap) (Home Health) 2 x Per Week/30 Days Discharge Instructions: Apply three layer compression as directed. Electronic Signature(s) Signed: 02/25/2020 4:10:15 PM By: Worthy Keeler PA-C Signed: 02/25/2020 5:29:15 PM By: [name] RN, BSN Entered By: [name] on 02/25/2020 15:43:07 -------------------------------------------------------------------------------- Problem List Details Patient Name: Date of Service: Lindaann Slough [name] 02/25/2020 2:15 PM Medical Record Number: [id number] Patient Account Number: [id number] Date of Birth/Sex: Treating RN: 1930-12-06 (  86 y.o. [name] Primary Care Provider: [name] Other Clinician: Referring Provider: Treating Provider/Extender: [name] in Treatment: 45 Active Problems ICD-10 Encounter Code Description Active Date MDM Diagnosis I87.331 Chronic venous hypertension (idiopathic) with ulcer and inflammation of right 04/14/2019 No Yes lower extremity L97.812 Non-pressure chronic ulcer of other part of right lower leg with fat layer  04/14/2019 No Yes exposed I89.0 Lymphedema, not elsewhere classified 04/14/2019 No Yes Inactive Problems ICD-10 Code Description Active Date Inactive Date L97.822 Non-pressure chronic ulcer of other part of left lower leg with fat layer exposed 09/08/2019 09/08/2019 L97.821 Non-pressure chronic ulcer of other part of left lower leg limited to breakdown of skin 04/21/2019 04/21/2019 L03.116 Cellulitis of left lower limb 09/08/2019 09/08/2019 Resolved Problems Electronic Signature(s) Signed: 02/25/2020 2:56:21 PM By: Worthy Keeler PA-C Entered By: Worthy Keeler on 02/25/2020 14:56:20 -------------------------------------------------------------------------------- Progress Note Details Patient Name: Date of Service: Lindaann Slough [name] 02/25/2020 2:15 PM Medical Record Number: [id number] Patient Account Number: [id number] Date of Birth/Sex: Treating RN: [birth year] (86 y.o. [name] Primary Care Provider: [name] Other Clinician: Referring Provider: Treating Provider/Extender: [name] in Treatment: 81 Subjective Chief Complaint Information obtained from Patient 03/22/17; patient is here for review of wounds on her bilateral legs History of Present Illness (HPI) We to find a new wound on the left 03/22/17; this is an 86 year old woman. There is not a lot of information in care everywhere on this patient. She had a history of a malignant neoplasm her left breast for which she had lumpectomy but she did not have radiation. Apparently she has had long-standing edema and her lower legs and she was seen in the wound care center at [facility] in [place] by [name] October. Both her legs were wrapped however it sounds as though the left leg became secondarily infected she apparently had MRSA and she was admitted the hospital. Not really turn for wound care. She lives at home on her own and has apparently [facility] home health care. We are not exactly  clear what [facility] is doing to her legs but it does not involve compression. She is not a diabetic. ABIs in our clinic were noncompressible bilaterally 03/30/17; this is a patient who has bilateral chronic venous insufficiency, severe venous inflammation. We had a nice description number legs from a note by [name] dating 2010 describing edematous warm legs with erythema. This suggests that she has chronic venous insufficiency with inflammation.. We have arterial studies dated 12/29/16 again from [facility]. This showed a right ABI at 1 and a TBI of 0.81 on the left the ABI was 1.25 and a TBI of 0.72. This was just she does not have significant arterial insufficiency. She had normal triphasic waveforms noted that she was admitted to hospital in late October with MSSA cellulitis Last visit we wrapped both her legs. She had an open area predominantly on the left anterior but weeping edema on the right anterior leg. She has done well there is only one at anterior left tibial wound 04/06/17; the patient has no open area on her right leg. Her remaining wound is on the left anterior leg, the left posterior leg wound is healed. We have been using silver alginate changed to [facility] on the left leg today she has home health 04/13/17; still no open area on her right leg. Her remaining wound on the left anterior leg. We have been using using  Hydrofera Blue 04/20/17; no open area on the right leg although the edema here is somewhat disfiguring. Her remaining wound is on the left anterior leg we've been using Hydrofera Blue with improvement in dimensions. She has [facility] home health and lives in [place]. She requests to come every 2 weeks because of the distance involved in coming here 05/04/17; there is no open area on the right leg. She has a very small left anterior leg wound that remains. However what remains on the left still has some depth and a not to viable looking surface. It  is too small to really attempt debridement. She has home health coming out to her home in [facility] 05/18/17; there is no open area on either leg. She has significant chronic venous insufficiency. The left anterior leg wound that was still open 2 weeks ago has closed. She has home health coming out. We are going to order Juxtalite stockings for both legs. She is agreed to pay for these privately. We will order them from [facility] READMISSION 04/14/2019 This is a patient we previously had in this clinic in 2019 for 3 months. She had a wound on the left anterior greater than right anterior leg. According to my notes we discharged her and juxta lite stockings although it does not appear that she ever got them. The history here is a bit difficult. She apparently has had a wound on the left lower leg for the last 6 months. She required admission to hospital in [facility] for apparent cellulitis just before Christmas was discharged home she has an open wound. She has a home health agency although we are not exactly sure which one. They are applying silver alginate. No compression Past medical history is reviewed she has chronic venous insufficiency and a history of left breast CA. 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 She did not have any compression on the wounds today. 2/8; substantial wound on the right posterior calf and a smaller area on the left medial calf. We are using Iodoflex to both wound areas under compression. We have better edema control We to find a new wound on the left anterior medial calf 2/15; the area on the left medial calf is closed over. Substantial area on the right posterior and lateral calf. We have been using Iodoflex although the patient complains of pain and wonders if there is not an alternative. She has home health changing the dressing twice a week and  she sees [place] once 2/22; the wound on the left medial calf is closed over. Her edema control is not good here but she cannot get on stockings. This substantial wound is on the right lateral and right posterior calf. Changed her to a hydrofera Blue last week. Gradual improvement in wound area. 3/8; wound on the left leg remains closed. The substantial wound is on the right lateral and right posterior calf. This is measuring smaller 3/22; 2-week follow-up. Substantial area on the right posterior and lateral calf measures slightly smaller. We are using Hydrofera Blue under compression 4/5; 2-week follow-up. Substantial area on the right posterior lateral calf. This measures slightly smaller. She has islands of epithelialization we have been using Hydrofera Blue under compression 4/19; 2-week follow-up. We have been using Hydrofera Blue however there is maceration around the wound. I will change to silver alginate today. She has  islands of epithelialization however I did not see much evidence that things were improving here. 5/6; 2-week follow-up. I put silver alginate on this because of maceration around the wound last time. However she comes in today with a lot of very adherent debris on the wound of the right lateral leg. 5/17; 2-week follow-up. Using Hydrofera Blue under compression. The patient has epithelialization including islands of epithelialization in the middle of fairly substantial wound area on the right posterior lateral calf 6/1; 2-week follow-up. Using Hydrofera Blue under compression. She seems to have increasing epithelialization. This is an irregular wound going from medial to lateral posteriorly in the right posterior calf. This would not be easy wound to get reproducible measurement 08/25/2019 upon evaluation today patient appears to actually be doing better in regard to the wound on her leg at this point. We have been using Adaptic followed by [facility] this is keeping her from  getting stuck. The patient is aware that this is also potentially slowing down her healing simply due to the fact that the [facility] not in direct contact with the wound bed. Nonetheless she was not happy with the way it was sticking previous which is why we switched to this according to what I can read in the note and hear from the patient and her aide with her today. 6/28; 2-week follow-up. We are following this patient for severe wound on the right lower leg secondary to chronic venous insufficiency using Hydrofera Blue under compression we are making gradual improvements. She arrives in clinic today with what looks like a contusion on the left anterior mid tibia. She says she was washing her leg and pulled some skin. However there is a fairly large wound with surrounding erythema and some swelling 7/12; 2-week follow-up. Right lower leg wound not quite as good as I thought. There is still islands of epithelialization we are using Hydrofera Blue with a contact layer I would like to see if we can get rid of the contact layer at this point. ooThe new area from last time is a lot smaller ooShe did not take the cephalexin I initially prescribed for the erythema edema around the left leg due to fears about diarrhea. She did tolerate doxycycline that we subsequently called in 7/26; we are doing 2-week follow-up. Wound measurements are about the same. Still the same fibrinous debris we have been using Hydrofera Blue. Change to Iodoflex today. 8/17; been using Iodoflex. Some improvement in the surface area of the large wound on the right posterior calf left anterior actually slightly larger. 100% slough covered although it is less adherent 9/seven 3-week follow-up. Been using Iodoflex. We still have continued improvement in the surface area of the large wound on the right calf. The small area on the left anteriorly still is not closed. 01/14/2020 on evaluation today patient appears to be doing  decently well in regard to her leg ulcer. Fortunately there is no signs of active infection at this time. With that being said the wound on the right leg laterally is still open and given her trouble. Fortunately there is no signs of active infection at this time. No fevers, chills, nausea, vomiting, or diarrhea. 11/18; I have not seen this woman in quite some time. She told me she had Covid in September. She has chronic venous insufficiency with lymphedema and initially had wounds on her right lateral and left anterior lower legs. The left anterior leg is closed she is wearing a stocking 02/11/2020 upon evaluation  today patient appears to be doing well with regard to her leg ulcer. This appears to be somewhat dry which is good although I think the Iodoflex is probably keeping this much too dry at this point. I think it may be time to switch to something different since it has accomplish the goal and we were kind of looking for. Fortunately there is no signs of active infection at this time. 02/25/2020 on evaluation today patient appears to be doing well with regard to her wound. She has been tolerating the dressing changes without complication. Fortunately there is no signs of active infection at this time. No fevers, chills, nausea, vomiting, or diarrhea. Objective Constitutional Well-nourished and well-hydrated in no acute distress. Vitals Time Taken: 2:49 PM, Height: 60 in, Weight: 130 lbs, BMI: 25.4, Temperature: 97.6 F, Pulse: 80 bpm, Respiratory Rate: 16 breaths/min, Blood Pressure: 165/84 mmHg. Respiratory normal breathing without difficulty. Psychiatric this patient is able to make decisions and demonstrates good insight into disease process. Alert and Oriented x 3. pleasant and cooperative. General Notes: Patient's wound bed showed signs of good granulation epithelization I do feel like she is making progress this is somewhat slow but nonetheless progress. Integumentary (Hair,  Skin) Wound #6 status is Open. Original cause of wound was Gradually Appeared. The wound is located on the Right Lower Leg. The wound measures 2cm length x 3cm width x 0.1cm depth; 4.712cm^2 area and 0.471cm^3 volume. There is Fat Layer (Subcutaneous Tissue) exposed. There is no tunneling or undermining noted. There is a medium amount of serosanguineous drainage noted. The wound margin is distinct with the outline attached to the wound base. There is large (67-100%) pink, pale granulation within the wound bed. There is a small (1-33%) amount of necrotic tissue within the wound bed including Adherent Slough. Assessment Active Problems ICD-10 Chronic venous hypertension (idiopathic) with ulcer and inflammation of right lower extremity Non-pressure chronic ulcer of other part of right lower leg with fat layer exposed Lymphedema, not elsewhere classified Procedures Wound #6 Pre-procedure diagnosis of Wound #6 is a Lymphedema located on the Right Lower Leg . There was a Three Layer Compression Therapy Procedure by [name], RN. Post procedure Diagnosis Wound #6: Same as Pre-Procedure Plan Follow-up Appointments: Return appointment in 3 weeks. Bathing/ Shower/ Hygiene: May shower with protection but do not get wound dressing(s) wet. Edema Control - Lymphedema / SCD / Other: Elevate legs to the level of the heart or above for 30 minutes daily and/or when sitting, a frequency of: Avoid standing for long periods of time. Exercise regularly Home Health: New wound care orders this week; continue Home Health for wound care. May utilize formulary equivalent dressing for wound treatment orders unless otherwise specified. - decrease dressing changes to 2 times per week Other Home Health Orders/Instructions: - [facility] WOUND #6: - Lower Leg Wound Laterality: Right Cleanser: Wound Cleanser (Home Health) 2 x Per Week/30 Days Discharge Instructions: Cleanse the wound with wound cleanser prior to  applying a clean dressing using gauze sponges, not tissue or cotton balls. Peri-Wound Care: Sween Lotion (Moisturizing lotion) (Home Health) 2 x Per Week/30 Days Discharge Instructions: Apply moisturizing lotion as directed Prim Dressing: Promogran Prisma Matrix, 4.34 (sq in) (silver collagen) (Home Health) 2 x Per Week/30 Days ary Discharge Instructions: Moisten collagen with saline or hydrogel Secondary Dressing: Woven Gauze Sponge, Non-Sterile 4x4 in (Home Health) 2 x Per Week/30 Days Discharge Instructions: Apply over primary dressing as directed. Com pression Wrap: ThreePress (3 layer compression wrap) (Home Health)  2 x Per Week/30 Days Discharge Instructions: Apply three layer compression as directed. 1. Would recommend currently that we going continue with the wound care measures as before and the patient is in agreement the plan this includes the use of collagen which seems to be doing an excellent job for her. Were also using a compression wrap in the way of a 3 layer compression wrap. She is tolerating that excellent. 2. I am also can recommend that we continue to have her elevate her legs much as possible try to keep edema under good control. Outside more she can do this the better as well. We will see patient back for reevaluation in 3 weeks here in the clinic. If anything worsens or changes patient will contact our office for additional recommendations. Home health will continue to see on a weekly basis. Electronic Signature(s) Signed: 02/25/2020 3:55:04 PM By: Worthy Keeler PA-C Entered By: Worthy Keeler on 02/25/2020 15:55:04 -------------------------------------------------------------------------------- SuperBill Details Patient Name: Date of Service: Lindaann Slough [name] 02/25/2020 Medical Record Number: [id number] Patient Account Number: [id number] Date of Birth/Sex: Treating RN: [birth year] (86 y.o. [name], [name] Primary Care Provider: [name] Other  Clinician: Referring Provider: Treating Provider/Extender: [name] in Treatment: 45 Diagnosis Coding ICD-10 Codes Code Description [phone] Chronic venous hypertension (idiopathic) with ulcer and inflammation of right lower extremity L97.812 Non-pressure chronic ulcer of other part of right lower leg with fat layer exposed I89.0 Lymphedema, not elsewhere classified Facility Procedures Physician Procedures : CPT4 Code Description Modifier [id number] - WC PHYS LEVEL 3 - EST PT ICD-10 Diagnosis Description I87.331 Chronic venous hypertension (idiopathic) with ulcer and inflammation of right lower extremity L97.812 Non-pressure chronic ulcer of other part  of right lower leg with fat layer exposed I89.0 Lymphedema, not elsewhere classified Quantity: 1 Electronic Signature(s) Signed: 02/25/2020 3:55:18 PM By: Worthy Keeler PA-C Entered By: Worthy Keeler on 02/25/2020 15:55:18

## 2020-03-17 ENCOUNTER — Other Ambulatory Visit: Payer: Self-pay

## 2020-03-17 ENCOUNTER — Encounter (HOSPITAL_BASED_OUTPATIENT_CLINIC_OR_DEPARTMENT_OTHER): Payer: Medicare HMO | Attending: Physician Assistant | Admitting: Physician Assistant

## 2020-03-17 DIAGNOSIS — I890 Lymphedema, not elsewhere classified: Secondary | ICD-10-CM | POA: Insufficient documentation

## 2020-03-17 DIAGNOSIS — I87331 Chronic venous hypertension (idiopathic) with ulcer and inflammation of right lower extremity: Secondary | ICD-10-CM | POA: Insufficient documentation

## 2020-03-17 DIAGNOSIS — Z853 Personal history of malignant neoplasm of breast: Secondary | ICD-10-CM | POA: Insufficient documentation

## 2020-03-17 DIAGNOSIS — L97812 Non-pressure chronic ulcer of other part of right lower leg with fat layer exposed: Secondary | ICD-10-CM | POA: Insufficient documentation

## 2020-03-17 DIAGNOSIS — Z8616 Personal history of COVID-19: Secondary | ICD-10-CM | POA: Diagnosis not present

## 2020-03-17 DIAGNOSIS — L97819 Non-pressure chronic ulcer of other part of right lower leg with unspecified severity: Secondary | ICD-10-CM | POA: Diagnosis present

## 2020-03-17 NOTE — Progress Notes (Signed)
RINDY, KOLLMAN ([id number]) Visit Report for 03/17/2020 Arrival Information Details Patient Name: Date of Service: Erin [name] [name] 03/17/2020 3:00 PM Medical Record Number: [id number] Patient Account Number: [id number] Date of Birth/Sex: Treating RN: December 21, 1930 (87 y.o. [name] Primary Care Sherman Lipuma: [name] Other Clinician: Referring Keelyn Fjelstad: Treating Petrice Beedy/Extender: [name] in Treatment: 32 Visit Information History Since Last Visit Added or deleted any medications: No Patient Arrived: Wheel Chair Any new allergies or adverse reactions: No Arrival Time: 14:50 Had a fall or experienced change in No Accompanied By: friend activities of daily living that may affect Transfer Assistance: None risk of falls: Patient Identification Verified: Yes Signs or symptoms of abuse/neglect since last visito No Secondary Verification Process Completed: Yes Hospitalized since last visit: No Patient Requires Transmission-Based Precautions: No Implantable device outside of the clinic excluding No Patient Has Alerts: Yes cellular tissue based products placed in the center Patient Alerts: NON COMPRESSABLE since last visit: Pain Present Now: No Electronic Signature(s) Signed: 03/17/2020 3:51:54 PM By: Mikeal Hawthorne EMT/HBOT/SD Entered By: Mikeal Hawthorne on 03/17/2020 14:58:54 -------------------------------------------------------------------------------- Compression Therapy Details Patient Name: Date of Service: Erin [name] [name] 03/17/2020 3:00 PM Medical Record Number: [id number] Patient Account Number: [id number] Date of Birth/Sex: Treating RN: 10-17-1930 (87 y.o. [name] Primary Care Hajar Penninger: [name] Other Clinician: Referring Bernardo Brayman: Treating Irma Roulhac/Extender: [name] in Treatment: 48 Compression Therapy Performed for Wound Assessment: Wound #6 Right Lower Leg Performed By:  Clinician [name], RN Compression Type: Three Layer Post Procedure Diagnosis Same as Pre-procedure Electronic Signature(s) Signed: 03/17/2020 5:18:45 PM By: [name] RN, BSN Entered By: [name] on 03/17/2020 15:39:53 -------------------------------------------------------------------------------- Encounter Discharge Information Details Patient Name: Date of Service: Erin [name] [name] 03/17/2020 3:00 PM Medical Record Number: [id number] Patient Account Number: [id number] Date of Birth/Sex: Treating RN: Jan 06, 1931 (87 y.o. [name], Tammi Klippel Primary Care Jakyah Bradby: [name] Other Clinician: Referring Roshawn Lacina: Treating Dolorez Jeffrey/Extender: [name] in Treatment: [id number] Encounter Discharge Information Items Discharge Condition: Stable Ambulatory Status: Wheelchair Discharge Destination: Home Transportation: Private Auto Accompanied By: family member Schedule Follow-up Appointment: Yes Clinical Summary of Care: Electronic Signature(s) Signed: 03/17/2020 5:17:23 PM By: Deon Pilling Entered By: Deon Pilling on 03/17/2020 16:23:21 -------------------------------------------------------------------------------- Lower Extremity Assessment Details Patient Name: Date of Service: Erin [name] [name] 03/17/2020 3:00 PM Medical Record Number: [id number] Patient Account Number: [id number] Date of Birth/Sex: Treating RN: 09-28-1930 (87 y.o. [name] Primary Care Alaila Pillard: [name] Other Clinician: Referring Latarsha Zani: Treating Reene Harlacher/Extender: [name] in Treatment: 48 Edema Assessment Assessed: [Left: No] [Right: No] Edema: [Left: Yes] [Right: No] Calf Left: Right: Point of Measurement: 35 cm From Medial Instep 28.5 cm Ankle Left: Right: Point of Measurement: 7 cm From Medial Instep 20.5 cm Vascular Assessment Pulses: Dorsalis Pedis Palpable: [Right:Yes] Electronic  Signature(s) Signed: 03/17/2020 3:51:54 PM By: Mikeal Hawthorne EMT/HBOT/SD Signed: 03/17/2020 5:18:45 PM By: [name] RN, BSN Entered By: Mikeal Hawthorne on 03/17/2020 15:06:03 -------------------------------------------------------------------------------- Multi-Disciplinary Care Plan Details Patient Name: Date of Service: Erin [name] [name] 03/17/2020 3:00 PM Medical Record Number: [id number] Patient Account Number: [id number] Date of Birth/Sex: Treating RN: November 18, 1930 (87 y.o. [name] Primary Care Adekunle Rohrbach: [name] Other Clinician: Referring Korinna Tat: Treating Kilan Banfill/Extender: [name] in Treatment: 48 Active Inactive Venous Leg Ulcer Nursing Diagnoses: Actual venous Insuffiency (use after diagnosis is confirmed) Knowledge deficit  related to disease process and management Goals: Patient will maintain optimal edema control Date Initiated: 04/14/2019 Target Resolution Date: 04/16/2020 Goal Status: Active Patient/caregiver will verbalize understanding of disease process and disease management Date Initiated: 04/14/2019 Date Inactivated: 05/19/2019 Target Resolution Date: 05/16/2019 Goal Status: Met Interventions: Assess peripheral edema status every visit. Compression as ordered Provide education on venous insufficiency Notes: Wound/Skin Impairment Nursing Diagnoses: Impaired tissue integrity Knowledge deficit related to ulceration/compromised skin integrity Goals: Patient/caregiver will verbalize understanding of skin care regimen Date Initiated: 04/14/2019 Target Resolution Date: 04/16/2020 Goal Status: Active Ulcer/skin breakdown will have a volume reduction of 30% by week 4 Date Initiated: 04/14/2019 Date Inactivated: 05/19/2019 Target Resolution Date: 05/16/2019 Goal Status: Met Ulcer/skin breakdown will have a volume reduction of 50% by week 8 Date Initiated: 05/19/2019 Date Inactivated: 06/16/2019 Target Resolution Date:  06/20/2019 Goal Status: Met Interventions: Assess patient/caregiver ability to obtain necessary supplies Assess patient/caregiver ability to perform ulcer/skin care regimen upon admission and as needed Assess ulceration(s) every visit Provide education on ulcer and skin care Notes: Electronic Signature(s) Signed: 03/17/2020 5:18:45 PM By: [name] RN, BSN Entered By: [name] on 03/17/2020 16:22:40 -------------------------------------------------------------------------------- Pain Assessment Details Patient Name: Date of Service: Erin [name] [name] 03/17/2020 3:00 PM Medical Record Number: [id number] Patient Account Number: [id number] Date of Birth/Sex: Treating RN: Mar 18, 1930 (87 y.o. [name] Primary Care Lygia Olaes: Other Clinician: [name] Referring Karole Oo: Treating Lavaeh Bau/Extender: [name] in Treatment: 48 Active Problems Location of Pain Severity and Description of Pain Patient Has Paino No Site Locations With Dressing Change: No Pain Management and Medication Current Pain Management: Electronic Signature(s) Signed: 03/17/2020 3:51:54 PM By: Mikeal Hawthorne EMT/HBOT/SD Signed: 03/17/2020 5:18:45 PM By: [name] RN, BSN Entered By: Mikeal Hawthorne on 03/17/2020 14:59:48 -------------------------------------------------------------------------------- Patient/Caregiver Education Details Patient Name: Date of Service: Erin [name] [name] 1/5/2022andnbsp3:00 PM Medical Record Number: [id number] Patient Account Number: [id number] Date of Birth/Gender: Treating RN: 07/18/1930 (87 y.o. [name] Primary Care Physician: [name] Other Clinician: Referring Physician: Treating Physician/Extender: [name] in Treatment: 50 Education Assessment Education Provided To: Patient Education Topics Provided Wound/Skin Impairment: Methods: Explain/Verbal Responses: State  content correctly [employer]) Signed: 03/17/2020 5:18:45 PM By: [name] RN, BSN Entered By: [name] on 03/17/2020 16:22:52 -------------------------------------------------------------------------------- Wound Assessment Details Patient Name: Date of Service: Erin [name] [name] 03/17/2020 3:00 PM Medical Record Number: [id number] Patient Account Number: [id number] Date of Birth/Sex: Treating RN: 21-Apr-1930 (87 y.o. [name] Primary Care Chanya Chrisley: [name] Other Clinician: Referring Shalom Ware: Treating Gesselle Fitzsimons/Extender: [name] in Treatment: 48 Wound Status Wound Number: 6 Primary Lymphedema Etiology: Wound Location: Right Lower Leg Wound Status: Open Wounding Event: Gradually Appeared Comorbid Cataracts, Glaucoma, Middle ear problems, Anemia, Date Acquired: 03/14/2019 History: Hypertension Weeks Of Treatment: 48 Clustered Wound: No Wound Measurements Length: (cm) 1.1 Width: (cm) 1.7 Depth: (cm) 0.2 Area: (cm) 1.469 Volume: (cm) 0.294 % Reduction in Area: 98.9% % Reduction in Volume: 99.5% Epithelialization: Medium (34-66%) Tunneling: No Undermining: No Wound Description Classification: Full Thickness Without Exposed Support Structures Wound Margin: Distinct, outline attached Exudate Amount: Medium Exudate Type: Serosanguineous Exudate Color: red, brown Foul Odor After Cleansing: No [name]/Fibrino Yes Wound Bed Granulation Amount: Large (67-100%) Exposed Structure Granulation Quality: Pink, Pale Fascia Exposed: No Necrotic Amount: Small (1-33%) Fat Layer (Subcutaneous Tissue) Exposed: Yes Necrotic Quality: Adherent [name] Tendon Exposed: No Muscle Exposed: No Joint Exposed: No Bone Exposed:  No Treatment Notes Wound #6 (Lower Leg) Wound Laterality: Right Cleanser Wound Cleanser Discharge Instruction: Cleanse the wound with wound cleanser prior to applying a clean dressing using gauze  sponges, not tissue or cotton balls. Peri-Wound Care Sween Lotion (Moisturizing lotion) Discharge Instruction: Apply moisturizing lotion as directed Topical Primary Dressing Promogran Prisma Matrix, 4.34 (sq in) (silver collagen) Discharge Instruction: Moisten collagen with saline or hydrogel Secondary Dressing Woven Gauze Sponge, Non-Sterile 4x4 in Discharge Instruction: Apply over primary dressing as directed. Secured With Compression Wrap ThreePress (3 layer compression wrap) Discharge Instruction: Apply three layer compression as directed. Compression Stockings Add-Ons Electronic Signature(s) Signed: 03/17/2020 3:51:54 PM By: Mikeal Hawthorne EMT/HBOT/SD Signed: 03/17/2020 5:18:45 PM By: [name] RN, BSN Entered By: Mikeal Hawthorne on 03/17/2020 15:07:08 -------------------------------------------------------------------------------- Vitals Details Patient Name: Date of Service: Erin [name] [name] 03/17/2020 3:00 PM Medical Record Number: [id number] Patient Account Number: [id number] Date of Birth/Sex: Treating RN: 08-28-1930 (87 y.o. [name] Primary Care Wentworth Edelen: [name] Other Clinician: Referring Tamaria Dunleavy: Treating Axyl Sitzman/Extender: [name] in Treatment: 48 Vital Signs Time Taken: 14:57 Temperature (F): 98.8 Height (in): 60 Pulse (bpm): 89 Weight (lbs): 130 Respiratory Rate (breaths/min): 16 Body Mass Index (BMI): 25.4 Blood Pressure (mmHg): 184/79 Reference Range: 80 - 120 mg / dl Electronic Signature(s) Signed: 03/17/2020 3:51:54 PM By: Mikeal Hawthorne EMT/HBOT/SD Entered By: Mikeal Hawthorne on 03/17/2020 14:58:59

## 2020-03-17 NOTE — Progress Notes (Addendum)
Erin, [name] ([id number]) Visit Report for 03/17/2020 Chief Complaint Document Details Patient Name: Date of Service: Erin [name] [name] 03/17/2020 3:00 PM Medical Record Number: [id number] Patient Account Number: [id number] Date of Birth/Sex: Treating RN: 1931-01-21 (87 y.o. Erin [name] Primary Care Provider: Arsenio [name] Other Clinician: Referring Provider: Treating Provider/Extender: Erin [name] in Treatment: [phone] Information Obtained from: Patient Chief Complaint 03/22/17; patient is here for review of wounds on her bilateral legs Electronic Signature(s) Signed: 03/17/2020 3:15:12 PM By: Erin Keeler PA-C Previous Signature: 03/17/2020 3:14:57 PM Version By: Erin Keeler PA-C Entered By: Erin [name] on 03/17/2020 15:15:12 -------------------------------------------------------------------------------- HPI Details Patient Name: Date of Service: Erin [name] [name] 03/17/2020 3:00 PM Medical Record Number: [id number] Patient Account Number: [id number] Date of Birth/Sex: Treating RN: 12-10-1930 (87 y.o. Erin [name] Primary Care Provider: Arsenio [name] Other Clinician: Referring Provider: Treating Provider/Extender: Erin [name] in Treatment: 88 History of Present Illness HPI Description: We to find a new wound on the left 03/22/17; this is an 87 year old woman. There is not a lot of information in care everywhere on this patient. She had a history of a malignant neoplasm her left breast for which she had lumpectomy but she did not have radiation. Apparently she has had long- standing edema and her lower legs and she was seen in the wound care center at [facility] in [place] by [name] October. Both her legs were wrapped however it sounds as though the left leg became secondarily infected she apparently had MRSA and she was admitted the hospital. Not really turn for wound care. She lives at home on her  own and has apparently [facility] home health care. We are not exactly clear what [facility] is doing to her legs but it does not involve compression. She is not a diabetic. ABIs in our clinic were noncompressible bilaterally 03/30/17; this is a patient who has bilateral chronic venous insufficiency, severe venous inflammation. We had a nice description number legs from a note by [name] dating 2010 describing edematous warm legs with erythema. This suggests that she has chronic venous insufficiency with inflammation.. We have arterial studies dated 12/29/16 again from [facility]. This showed a right ABI at 1 and a TBI of 0.81 on the left the ABI was 1.25 and a TBI of 0.72. This was just she does not have significant arterial insufficiency. She had normal triphasic waveforms noted that she was admitted to hospital in late October with MSSA cellulitis Last visit we wrapped both her legs. She had an open area predominantly on the left anterior but weeping edema on the right anterior leg. She has done well there is only one at anterior left tibial wound 04/06/17; the patient has no open area on her right leg. Her remaining wound is on the left anterior leg, the left posterior leg wound is healed. We have been using silver alginate changed to [facility] on the left leg today she has home health 04/13/17; still no open area on her right leg. Her remaining wound on the left anterior leg. We have been using using Hydrofera Blue 04/20/17; no open area on the right leg although the edema here is somewhat disfiguring. Her remaining wound is on the left anterior leg we've been using Hydrofera Blue with improvement in dimensions. She has [facility] home health and lives in [place]. She requests to come every 2 weeks because of the  distance involved in coming here 05/04/17; there is no open area on the right leg. She has a very small left anterior leg wound that remains. However what remains on the  left still has some depth and a not to viable looking surface. It is too small to really attempt debridement. She has home health coming out to her home in [facility] 05/18/17; there is no open area on either leg. She has significant chronic venous insufficiency. The left anterior leg wound that was still open 2 weeks ago has closed. She has home health coming out. We are going to order Juxtalite stockings for both legs. She is agreed to pay for these privately. We will order them from [facility] READMISSION 04/14/2019 This is a patient we previously had in this clinic in 2019 for 3 months. She had a wound on the left anterior greater than right anterior leg. According to my notes we discharged her and juxta lite stockings although it does not appear that she ever got them. The history here is a bit difficult. She apparently has had a wound on the left lower leg for the last 6 months. She required admission to hospital in [facility] for apparent cellulitis just before Christmas was discharged home she has an open wound. She has a home health agency although we are not exactly sure which one. They are applying silver alginate. No compression Past medical history is reviewed she has chronic venous insufficiency and a history of left breast CA. 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 She did not have any compression on the wounds today. 2/8; substantial wound on the right posterior calf and a smaller area on the left medial calf. We are using Iodoflex to both wound areas under compression. We have better edema control We to find a new wound on the left anterior medial calf 2/15; the area on the left medial calf is closed over. Substantial area on the right posterior and lateral calf. We have been using Iodoflex although the patient complains of pain and wonders if there is not an  alternative. She has home health changing the dressing twice a week and she sees [place] once 2/22; the wound on the left medial calf is closed over. Her edema control is not good here but she cannot get on stockings. This substantial wound is on the right lateral and right posterior calf. Changed her to a hydrofera Blue last week. Gradual improvement in wound area. 3/8; wound on the left leg remains closed. The substantial wound is on the right lateral and right posterior calf. This is measuring smaller 3/22; 2-week follow-up. Substantial area on the right posterior and lateral calf measures slightly smaller. We are using Hydrofera Blue under compression 4/5; 2-week follow-up. Substantial area on the right posterior lateral calf. This measures slightly smaller. She has islands of epithelialization we have been using Hydrofera Blue under compression 4/19; 2-week follow-up. We have been using Hydrofera Blue however there is maceration around the wound. I will change to silver alginate today. She has islands of epithelialization however I did not see much evidence that things were improving here. 5/6; 2-week follow-up. I put silver alginate on this because of maceration around the wound last time. However she comes in today with a lot of very adherent debris on the wound of the right lateral leg. 5/17; 2-week follow-up. Using  Hydrofera Blue under compression. The patient has epithelialization including islands of epithelialization in the middle of fairly substantial wound area on the right posterior lateral calf 6/1; 2-week follow-up. Using Hydrofera Blue under compression. She seems to have increasing epithelialization. This is an irregular wound going from medial to lateral posteriorly in the right posterior calf. This would not be easy wound to get reproducible measurement 08/25/2019 upon evaluation today patient appears to actually be doing better in regard to the wound on her leg at this point. We have  been using Adaptic followed by [facility] this is keeping her from getting stuck. The patient is aware that this is also potentially slowing down her healing simply due to the fact that the [facility] not in direct contact with the wound bed. Nonetheless she was not happy with the way it was sticking previous which is why we switched to this according to what I can read in the note and hear from the patient and her aide with her today. 6/28; 2-week follow-up. We are following this patient for severe wound on the right lower leg secondary to chronic venous insufficiency using Hydrofera Blue under compression we are making gradual improvements. She arrives in clinic today with what looks like a contusion on the left anterior mid tibia. She says she was washing her leg and pulled some skin. However there is a fairly large wound with surrounding erythema and some swelling 7/12; 2-week follow-up. Right lower leg wound not quite as good as I thought. There is still islands of epithelialization we are using Hydrofera Blue with a contact layer I would like to see if we can get rid of the contact layer at this point. The new area from last time is a lot smaller She did not take the cephalexin I initially prescribed for the erythema edema around the left leg due to fears about diarrhea. She did tolerate doxycycline that we subsequently called in 7/26; we are doing 2-week follow-up. Wound measurements are about the same. Still the same fibrinous debris we have been using Hydrofera Blue. Change to Iodoflex today. 8/17; been using Iodoflex. Some improvement in the surface area of the large wound on the right posterior calf left anterior actually slightly larger. 100% [name] covered although it is less adherent 9/seven 3-week follow-up. Been using Iodoflex. We still have continued improvement in the surface area of the large wound on the right calf. The small area on the left anteriorly still is not  closed. 01/14/2020 on evaluation today patient appears to be doing decently well in regard to her leg ulcer. Fortunately there is no signs of active infection at this time. With that being said the wound on the right leg laterally is still open and given her trouble. Fortunately there is no signs of active infection at this time. No fevers, chills, nausea, vomiting, or diarrhea. 11/18; I have not seen this woman in quite some time. She told me she had Covid in September. She has chronic venous insufficiency with lymphedema and initially had wounds on her right lateral and left anterior lower legs. The left anterior leg is closed she is wearing a stocking 02/11/2020 upon evaluation today patient appears to be doing well with regard to her leg ulcer. This appears to be somewhat dry which is good although I think the Iodoflex is probably keeping this much too dry at this point. I think it may be time to switch to something different since it has accomplish the goal and we  were kind of looking for. Fortunately there is no signs of active infection at this time. 02/25/2020 on evaluation today patient appears to be doing well with regard to her wound. She has been tolerating the dressing changes without complication. Fortunately there is no signs of active infection at this time. No fevers, chills, nausea, vomiting, or diarrhea. 03/17/2020 upon evaluation today patient's wound on the right leg seems to be doing quite well. I am very pleased with where things stand today. There does not appear to be any evidence of infection and overall I think she is managing quite nicely at this point. She is pleased. With that being said she does have some swelling of the left leg which is quite significant and again she has not really been checked for a possibility of a blood clot I think we may need to check a venous Doppler study. Electronic Signature(s) Signed: 03/17/2020 3:59:25 PM By: Erin Keeler PA-C Entered By:  Erin [name] on 03/17/2020 15:59:25 -------------------------------------------------------------------------------- Physical Exam Details Patient Name: Date of Service: Erin [name] [name] 03/17/2020 3:00 PM Medical Record Number: [id number] Patient Account Number: [id number] Date of Birth/Sex: Treating RN: [birth year] (87 y.o. Erin [name] Primary Care Provider: Arsenio [name] Other Clinician: Referring Provider: Treating Provider/Extender: Erin [name] in Treatment: 66 Constitutional Well-nourished and well-hydrated in no acute distress. Respiratory normal breathing without difficulty. Psychiatric this patient is able to make decisions and demonstrates good insight into disease process. Alert and Oriented x 3. pleasant and cooperative. Notes Upon inspection patient's wound bed actually showed signs of good granulation at this time. There does not appear to be evidence of active infection which is great news and overall very pleased in that regard. Fortunately I think that she seems to be making good progress here in regard to the wound. Unfortunately I think the swelling is still a major issue. Electronic Signature(s) Signed: 03/17/2020 3:59:45 PM By: Erin Keeler PA-C Entered By: Erin [name] on 03/17/2020 15:59:45 -------------------------------------------------------------------------------- Physician Orders Details Patient Name: Date of Service: Erin [name] [name] 03/17/2020 3:00 PM Medical Record Number: [id number] Patient Account Number: [id number] Date of Birth/Sex: Treating RN: 06-25-1930 (87 y.o. Erin [name] Primary Care Provider: Arsenio [name] Other Clinician: Referring Provider: Treating Provider/Extender: Erin [name] in Treatment: [phone] Verbal / Phone Orders: No Diagnosis Coding ICD-10 Coding Code Description I87.331 Chronic venous hypertension (idiopathic) with ulcer and inflammation of right  lower extremity L97.812 Non-pressure chronic ulcer of other part of right lower leg with fat layer exposed I89.0 Lymphedema, not elsewhere classified Follow-up Appointments Return Appointment in 1 week. Bathing/ Shower/ Hygiene May shower with protection but do not get wound dressing(s) wet. Edema Control - Lymphedema / SCD / Other Bilateral Lower Extremities Elevate legs to the level of the heart or above for 30 minutes daily and/or when sitting, a frequency of: - throughout the day Avoid standing for long periods of time. Exercise regularly Home Health No change in wound care orders this week; continue Home Health for wound care. May utilize formulary equivalent dressing for wound treatment orders unless otherwise specified. Other Home Health Orders/Instructions: - [facility] Wound Treatment Wound #6 - Lower Leg Wound Laterality: Right Cleanser: Wound Cleanser (Home Health) 2 x Per Week/30 Days Discharge Instructions: Cleanse the wound with wound cleanser prior to applying a clean dressing using gauze sponges, not tissue or cotton balls. Peri-Wound Care: Sween Lotion (Moisturizing lotion) (Home Health) 2 x Per  Week/30 Days Discharge Instructions: Apply moisturizing lotion as directed Prim Dressing: Promogran Prisma Matrix, 4.34 (sq in) (silver collagen) (Home Health) 2 x Per Week/30 Days ary Discharge Instructions: Moisten collagen with saline or hydrogel Secondary Dressing: Woven Gauze Sponge, Non-Sterile 4x4 in (Home Health) 2 x Per Week/30 Days Discharge Instructions: Apply over primary dressing as directed. Compression Wrap: ThreePress (3 layer compression wrap) (Home Health) 2 x Per Week/30 Days Discharge Instructions: Apply three layer compression as directed. Services and Therapies Venous Duplex Doppler, left lower leg, DVT rule out - Urgent - Increased swelling in left lower leg - (ICD10 I89.0 - Lymphedema, not elsewhere classified) Electronic Signature(s) Signed: 03/17/2020  4:44:38 PM By: Erin Keeler PA-C Signed: 03/17/2020 5:18:45 PM By: [name] RN, BSN Entered By: [name] on 03/17/2020 15:38:24 Prescription 03/17/2020 -------------------------------------------------------------------------------- Waldrip, Maevis A. Erin [name] [place] Patient Name: Provider: 1931/02/27 [id number] Date of Birth: NPI#: [id number] Sex: DEA #: [phone] Phone #: License #: [place] Patient Address: [address], [place] [address] [place], [place] [address] [phone] Allergies No Known Allergies Provider's Orders Venous Duplex Doppler, left lower leg, DVT rule out - ICD10: I89.0 - Urgent - Increased swelling in left lower leg Hand Signature: Date(s): Electronic Signature(s) Signed: 03/17/2020 4:44:38 PM By: Erin Keeler PA-C Signed: 03/17/2020 5:18:45 PM By: [name] RN, BSN Entered By: [name] on 03/17/2020 15:38:25 -------------------------------------------------------------------------------- Problem List Details Patient Name: Date of Service: Erin [name] [name] 03/17/2020 3:00 PM Medical Record Number: [id number] Patient Account Number: [id number] Date of Birth/Sex: Treating RN: [birth year] (87 y.o. Erin [name] Primary Care Provider: Arsenio [name] Other Clinician: Referring Provider: Treating Provider/Extender: Erin [name] in Treatment: 48 Active Problems ICD-10 Encounter Code Description Active Date MDM Diagnosis I87.331 Chronic venous hypertension (idiopathic) with ulcer and inflammation of right 04/14/2019 No Yes lower extremity L97.812 Non-pressure chronic ulcer of other part of right lower leg with fat layer 04/14/2019 No Yes exposed I89.0 Lymphedema, not elsewhere classified 04/14/2019 No Yes Inactive Problems ICD-10 Code Description Active Date Inactive Date L97.822 Non-pressure chronic ulcer of other part of  left lower leg with fat layer exposed 09/08/2019 09/08/2019 L97.821 Non-pressure chronic ulcer of other part of left lower leg limited to breakdown of skin 04/21/2019 04/21/2019 L03.116 Cellulitis of left lower limb 09/08/2019 09/08/2019 Resolved Problems Electronic Signature(s) Signed: 03/17/2020 3:14:48 PM By: Erin Keeler PA-C Entered By: Erin [name] on 03/17/2020 15:14:48 -------------------------------------------------------------------------------- Progress Note Details Patient Name: Date of Service: Erin [name] [name] 03/17/2020 3:00 PM Medical Record Number: [id number] Patient Account Number: [id number] Date of Birth/Sex: Treating RN: 05-07-1930 (87 y.o. Erin [name] Primary Care Provider: Arsenio [name] Other Clinician: Referring Provider: Treating Provider/Extender: Erin [name] in Treatment: 27 Subjective Chief Complaint Information obtained from Patient 03/22/17; patient is here for review of wounds on her bilateral legs History of Present Illness (HPI) We to find a new wound on the left 03/22/17; this is an 87 year old woman. There is not a lot of information in care everywhere on this patient. She had a history of a malignant neoplasm her left breast for which she had lumpectomy but she did not have radiation. Apparently she has had long-standing edema and her lower legs and she was seen in the wound care center at [facility] in [place] by [name] October. Both her legs were wrapped however it sounds  as though the left leg became secondarily infected she apparently had MRSA and she was admitted the hospital. Not really turn for wound care. She lives at home on her own and has apparently [facility] home health care. We are not exactly clear what [facility] is doing to her legs but it does not involve compression. She is not a diabetic. ABIs in our clinic were noncompressible bilaterally 03/30/17; this is a patient who has bilateral chronic  venous insufficiency, severe venous inflammation. We had a nice description number legs from a note by [name] dating 2010 describing edematous warm legs with erythema. This suggests that she has chronic venous insufficiency with inflammation.. We have arterial studies dated 12/29/16 again from [facility]. This showed a right ABI at 1 and a TBI of 0.81 on the left the ABI was 1.25 and a TBI of 0.72. This was just she does not have significant arterial insufficiency. She had normal triphasic waveforms noted that she was admitted to hospital in late October with MSSA cellulitis Last visit we wrapped both her legs. She had an open area predominantly on the left anterior but weeping edema on the right anterior leg. She has done well there is only one at anterior left tibial wound 04/06/17; the patient has no open area on her right leg. Her remaining wound is on the left anterior leg, the left posterior leg wound is healed. We have been using silver alginate changed to [facility] on the left leg today she has home health 04/13/17; still no open area on her right leg. Her remaining wound on the left anterior leg. We have been using using Hydrofera Blue 04/20/17; no open area on the right leg although the edema here is somewhat disfiguring. Her remaining wound is on the left anterior leg we've been using Hydrofera Blue with improvement in dimensions. She has [facility] home health and lives in [place]. She requests to come every 2 weeks because of the distance involved in coming here 05/04/17; there is no open area on the right leg. She has a very small left anterior leg wound that remains. However what remains on the left still has some depth and a not to viable looking surface. It is too small to really attempt debridement. She has home health coming out to her home in [facility] 05/18/17; there is no open area on either leg. She has significant chronic venous insufficiency. The left anterior  leg wound that was still open 2 weeks ago has closed. She has home health coming out. We are going to order Juxtalite stockings for both legs. She is agreed to pay for these privately. We will order them from [facility] READMISSION 04/14/2019 This is a patient we previously had in this clinic in 2019 for 3 months. She had a wound on the left anterior greater than right anterior leg. According to my notes we discharged her and juxta lite stockings although it does not appear that she ever got them. The history here is a bit difficult. She apparently has had a wound on the left lower leg for the last 6 months. She required admission to hospital in [facility] for apparent cellulitis just before Christmas was discharged home she has an open wound. She has a home health agency although we are not exactly sure which one. They are applying silver alginate. No compression Past medical history is reviewed she has chronic venous insufficiency and a history of left breast CA. She also has a history of MRSA  in the wound ABIs were not done in the clinic today however she did have noninvasive arterial studies in 2018 which are actually quite good. She did not have any compression on the wounds today. 2/8; substantial wound on the right posterior calf and a smaller area on the left medial calf. We are using Iodoflex to both wound areas under compression. We have better edema control We to find a new wound on the left anterior medial calf 2/15; the area on the left medial calf is closed over. Substantial area on the right posterior and lateral calf. We have been using Iodoflex although the patient complains of pain and wonders if there is not an alternative. She has home health changing the dressing twice a week and she sees [place] once 2/22; the wound on the left medial calf is closed over. Her edema control is not good here but she cannot get on stockings. This substantial wound is on the right lateral and right posterior  calf. Changed her to a hydrofera Blue last week. Gradual improvement in wound area. 3/8; wound on the left leg remains closed. The substantial wound is on the right lateral and right posterior calf. This is measuring smaller 3/22; 2-week follow-up. Substantial area on the right posterior and lateral calf measures slightly smaller. We are using Hydrofera Blue under compression 4/5; 2-week follow-up. Substantial area on the right posterior lateral calf. This measures slightly smaller. She has islands of epithelialization we have been using Hydrofera Blue under compression 4/19; 2-week follow-up. We have been using Hydrofera Blue however there is maceration around the wound. I will change to silver alginate today. She has islands of epithelialization however I did not see much evidence that things were improving here. 5/6; 2-week follow-up. I put silver alginate on this because of maceration around the wound last time. However she comes in today with a lot of very adherent debris on the wound of the right lateral leg. 5/17; 2-week follow-up. Using Hydrofera Blue under compression. The patient has epithelialization including islands of epithelialization in the middle of fairly substantial wound area on the right posterior lateral calf 6/1; 2-week follow-up. Using Hydrofera Blue under compression. She seems to have increasing epithelialization. This is an irregular wound going from medial to lateral posteriorly in the right posterior calf. This would not be easy wound to get reproducible measurement 08/25/2019 upon evaluation today patient appears to actually be doing better in regard to the wound on her leg at this point. We have been using Adaptic followed by [facility] this is keeping her from getting stuck. The patient is aware that this is also potentially slowing down her healing simply due to the fact that the [facility] not in direct contact with the wound bed. Nonetheless she was not happy  with the way it was sticking previous which is why we switched to this according to what I can read in the note and hear from the patient and her aide with her today. 6/28; 2-week follow-up. We are following this patient for severe wound on the right lower leg secondary to chronic venous insufficiency using Hydrofera Blue under compression we are making gradual improvements. She arrives in clinic today with what looks like a contusion on the left anterior mid tibia. She says she was washing her leg and pulled some skin. However there is a fairly large wound with surrounding erythema and some swelling 7/12; 2-week follow-up. Right lower leg wound not quite as good as I thought. There is  still islands of epithelialization we are using Hydrofera Blue with a contact layer I would like to see if we can get rid of the contact layer at this point. ooThe new area from last time is a lot smaller ooShe did not take the cephalexin I initially prescribed for the erythema edema around the left leg due to fears about diarrhea. She did tolerate doxycycline that we subsequently called in 7/26; we are doing 2-week follow-up. Wound measurements are about the same. Still the same fibrinous debris we have been using Hydrofera Blue. Change to Iodoflex today. 8/17; been using Iodoflex. Some improvement in the surface area of the large wound on the right posterior calf left anterior actually slightly larger. 100% [name] covered although it is less adherent 9/seven 3-week follow-up. Been using Iodoflex. We still have continued improvement in the surface area of the large wound on the right calf. The small area on the left anteriorly still is not closed. 01/14/2020 on evaluation today patient appears to be doing decently well in regard to her leg ulcer. Fortunately there is no signs of active infection at this time. With that being said the wound on the right leg laterally is still open and given her trouble. Fortunately  there is no signs of active infection at this time. No fevers, chills, nausea, vomiting, or diarrhea. 11/18; I have not seen this woman in quite some time. She told me she had Covid in September. She has chronic venous insufficiency with lymphedema and initially had wounds on her right lateral and left anterior lower legs. The left anterior leg is closed she is wearing a stocking 02/11/2020 upon evaluation today patient appears to be doing well with regard to her leg ulcer. This appears to be somewhat dry which is good although I think the Iodoflex is probably keeping this much too dry at this point. I think it may be time to switch to something different since it has accomplish the goal and we were kind of looking for. Fortunately there is no signs of active infection at this time. 02/25/2020 on evaluation today patient appears to be doing well with regard to her wound. She has been tolerating the dressing changes without complication. Fortunately there is no signs of active infection at this time. No fevers, chills, nausea, vomiting, or diarrhea. 03/17/2020 upon evaluation today patient's wound on the right leg seems to be doing quite well. I am very pleased with where things stand today. There does not appear to be any evidence of infection and overall I think she is managing quite nicely at this point. She is pleased. With that being said she does have some swelling of the left leg which is quite significant and again she has not really been checked for a possibility of a blood clot I think we may need to check a venous Doppler study. Objective Constitutional Well-nourished and well-hydrated in no acute distress. Vitals Time Taken: 2:57 PM, Height: 60 in, Weight: 130 lbs, BMI: 25.4, Temperature: 98.8 F, Pulse: 89 bpm, Respiratory Rate: 16 breaths/min, Blood Pressure: 184/79 mmHg. Respiratory normal breathing without difficulty. Psychiatric this patient is able to make decisions and  demonstrates good insight into disease process. Alert and Oriented x 3. pleasant and cooperative. General Notes: Upon inspection patient's wound bed actually showed signs of good granulation at this time. There does not appear to be evidence of active infection which is great news and overall very pleased in that regard. Fortunately I think that she seems to be making  good progress here in regard to the wound. Unfortunately I think the swelling is still a major issue. Integumentary (Hair, Skin) Wound #6 status is Open. Original cause of wound was Gradually Appeared. The wound is located on the Right Lower Leg. The wound measures 1.1cm length x 1.7cm width x 0.2cm depth; 1.469cm^2 area and 0.294cm^3 volume. There is Fat Layer (Subcutaneous Tissue) exposed. There is no tunneling or undermining noted. There is a medium amount of serosanguineous drainage noted. The wound margin is distinct with the outline attached to the wound base. There is large (67-100%) pink, pale granulation within the wound bed. There is a small (1-33%) amount of necrotic tissue within the wound bed including Adherent [name]. Assessment Active Problems ICD-10 Chronic venous hypertension (idiopathic) with ulcer and inflammation of right lower extremity Non-pressure chronic ulcer of other part of right lower leg with fat layer exposed Lymphedema, not elsewhere classified Procedures Wound #6 Pre-procedure diagnosis of Wound #6 is a Lymphedema located on the Right Lower Leg . There was a Three Layer Compression Therapy Procedure by [name], RN. Post procedure Diagnosis Wound #6: Same as Pre-Procedure Plan Follow-up Appointments: Return Appointment in 1 week. Bathing/ Shower/ Hygiene: May shower with protection but do not get wound dressing(s) wet. Edema Control - Lymphedema / SCD / Other: Elevate legs to the level of the heart or above for 30 minutes daily and/or when sitting, a frequency of: - throughout the  day Avoid standing for long periods of time. Exercise regularly Home Health: No change in wound care orders this week; continue Home Health for wound care. May utilize formulary equivalent dressing for wound treatment orders unless otherwise specified. Other Home Health Orders/Instructions: - [facility] and Therapies ordered were: Venous Duplex Doppler, left lower leg, DVT rule out - Urgent - Increased swelling in left lower leg WOUND #6: - Lower Leg Wound Laterality: Right Cleanser: Wound Cleanser (Home Health) 2 x Per Week/30 Days Discharge Instructions: Cleanse the wound with wound cleanser prior to applying a clean dressing using gauze sponges, not tissue or cotton balls. Peri-Wound Care: Sween Lotion (Moisturizing lotion) (Home Health) 2 x Per Week/30 Days Discharge Instructions: Apply moisturizing lotion as directed Prim Dressing: Promogran Prisma Matrix, 4.34 (sq in) (silver collagen) (Home Health) 2 x Per Week/30 Days ary Discharge Instructions: Moisten collagen with saline or hydrogel Secondary Dressing: Woven Gauze Sponge, Non-Sterile 4x4 in (Home Health) 2 x Per Week/30 Days Discharge Instructions: Apply over primary dressing as directed. Compression Wrap: ThreePress (3 layer compression wrap) (Home Health) 2 x Per Week/30 Days Discharge Instructions: Apply three layer compression as directed. 1. Would recommend currently that ability to continue with the wound care measures as before and the patient is in agreement with that plan this includes the use of the collagen which I think is doing a great job. We will subsequently continue as well the 3 layer compression wrap. 2. I would also recommend she continue to take her fluid pills and elevate her legs much as possible. 3. I am going to go ahead and recommend a DVT study to evaluate and ensure she does not have a DVT of the left leg. If she does not but I think the biggest issue is good to be addressing the fluid and  deciding what we want to do from a standpoint of fluid control to try to keep this under control. I think a Velcro wrap may be beneficial. We will see patient back for reevaluation in 1 week here in the clinic.  If anything worsens or changes patient will contact our office for additional recommendations. Electronic Signature(s) Signed: 03/17/2020 4:01:03 PM By: Erin Keeler PA-C Entered By: Erin [name] on 03/17/2020 16:01:02 -------------------------------------------------------------------------------- SuperBill Details Patient Name: Date of Service: Erin [name] [name] 03/17/2020 Medical Record Number: [id number] Patient Account Number: [id number] Date of Birth/Sex: Treating RN: [birth year] (87 y.o. Erin [name] Primary Care Provider: Arsenio [name] Other Clinician: Referring Provider: Treating Provider/Extender: Erin [name] in Treatment: 48 Diagnosis Coding ICD-10 Codes Code Description [phone] Chronic venous hypertension (idiopathic) with ulcer and inflammation of right lower extremity L97.812 Non-pressure chronic ulcer of other part of right lower leg with fat layer exposed I89.0 Lymphedema, not elsewhere classified Facility Procedures CPT4 Code: [id number] Description: (Facility Use Only) [phone] - [place] LWR RT LEG Modifier: Quantity: 1 Physician Procedures : CPT4 Code Description Modifier [id number] - WC PHYS LEVEL 3 - EST PT ICD-10 Diagnosis Description I87.331 Chronic venous hypertension (idiopathic) with ulcer and inflammation of right lower extremity L97.812 Non-pressure chronic ulcer of other part  of right lower leg with fat layer exposed I89.0 Lymphedema, not elsewhere classified Quantity: 1 Electronic Signature(s) Signed: 03/17/2020 4:01:18 PM By: Erin Keeler PA-C Entered By: Erin [name] on 03/17/2020 16:01:17

## 2020-03-18 ENCOUNTER — Other Ambulatory Visit (HOSPITAL_COMMUNITY): Payer: Self-pay | Admitting: Physician Assistant

## 2020-03-18 ENCOUNTER — Ambulatory Visit (HOSPITAL_COMMUNITY)
Admission: RE | Admit: 2020-03-18 | Discharge: 2020-03-18 | Disposition: A | Discharge status: Home / Self Care | Payer: Medicare HMO | Source: Ambulatory Visit | Attending: Physician Assistant | Admitting: Physician Assistant

## 2020-03-18 ENCOUNTER — Other Ambulatory Visit: Payer: Self-pay

## 2020-03-18 DIAGNOSIS — I890 Lymphedema, not elsewhere classified: Secondary | ICD-10-CM | POA: Diagnosis present

## 2020-03-24 ENCOUNTER — Encounter (HOSPITAL_BASED_OUTPATIENT_CLINIC_OR_DEPARTMENT_OTHER): Payer: Medicare HMO | Admitting: Physician Assistant

## 2020-03-24 ENCOUNTER — Other Ambulatory Visit: Payer: Self-pay

## 2020-03-24 DIAGNOSIS — I87331 Chronic venous hypertension (idiopathic) with ulcer and inflammation of right lower extremity: Secondary | ICD-10-CM | POA: Diagnosis not present

## 2020-03-24 NOTE — Progress Notes (Addendum)
CIERRA, ROTHGEB ([id number]) Visit Report for 03/24/2020 Chief Complaint Document Details Patient Name: Date of Service: Erin [name] [name] 03/24/2020 2:45 PM Medical Record Number: [id number] Patient Account Number: [id number] Date of Birth/Sex: Treating RN: Jan 07, 1931 (87 y.o. [name] Primary Care Provider: [name] Other Clinician: Referring Provider: Treating Provider/Extender: [name] in Treatment: 14 Information Obtained from: Patient Chief Complaint 03/22/17; patient is here for review of wounds on her bilateral legs Electronic Signature(s) Signed: 03/24/2020 3:13:40 PM By: Worthy Keeler PA-C Entered By: Worthy Keeler on 03/24/2020 15:13:40 -------------------------------------------------------------------------------- HPI Details Patient Name: Date of Service: Erin [name] [name] 03/24/2020 2:45 PM Medical Record Number: [id number] Patient Account Number: [id number] Date of Birth/Sex: Treating RN: 10/19/1930 (87 y.o. [name] Primary Care Provider: [name] Other Clinician: Referring Provider: Treating Provider/Extender: [name] in Treatment: 49 History of Present Illness HPI Description: We to find a new wound on the left 03/22/17; this is an 87 year old woman. There is not a lot of information in care everywhere on this patient. She had a history of a malignant neoplasm her left breast for which she had lumpectomy but she did not have radiation. Apparently she has had long- standing edema and her lower legs and she was seen in the wound care center at [facility] in [place] by [name] October. Both her legs were wrapped however it sounds as though the left leg became secondarily infected she apparently had MRSA and she was admitted the hospital. Not really turn for wound care. She lives at home on her own and has apparently [facility] home health care. We are not  exactly clear what [facility] is doing to her legs but it does not involve compression. She is not a diabetic. ABIs in our clinic were noncompressible bilaterally 03/30/17; this is a patient who has bilateral chronic venous insufficiency, severe venous inflammation. We had a nice description number legs from a note by [name] dating 2010 describing edematous warm legs with erythema. This suggests that she has chronic venous insufficiency with inflammation.. We have arterial studies dated 12/29/16 again from [facility]. This showed a right ABI at 1 and a TBI of 0.81 on the left the ABI was 1.25 and a TBI of 0.72. This was just she does not have significant arterial insufficiency. She had normal triphasic waveforms noted that she was admitted to hospital in late October with MSSA cellulitis Last visit we wrapped both her legs. She had an open area predominantly on the left anterior but weeping edema on the right anterior leg. She has done well there is only one at anterior left tibial wound 04/06/17; the patient has no open area on her right leg. Her remaining wound is on the left anterior leg, the left posterior leg wound is healed. We have been using silver alginate changed to [facility] on the left leg today she has home health 04/13/17; still no open area on her right leg. Her remaining wound on the left anterior leg. We have been using using Hydrofera Blue 04/20/17; no open area on the right leg although the edema here is somewhat disfiguring. Her remaining wound is on the left anterior leg we've been using Hydrofera Blue with improvement in dimensions. She has [facility] home health and lives in [place]. She requests to come every 2 weeks because of the distance involved in coming here 05/04/17; there is no open area  on the right leg. She has a very small left anterior leg wound that remains. However what remains on the left still has some depth and a not to viable looking  surface. It is too small to really attempt debridement. She has home health coming out to her home in [facility] 05/18/17; there is no open area on either leg. She has significant chronic venous insufficiency. The left anterior leg wound that was still open 2 weeks ago has closed. She has home health coming out. We are going to order Juxtalite stockings for both legs. She is agreed to pay for these privately. We will order them from [facility] READMISSION 04/14/2019 This is a patient we previously had in this clinic in 2019 for 3 months. She had a wound on the left anterior greater than right anterior leg. According to my notes we discharged her and juxta lite stockings although it does not appear that she ever got them. The history here is a bit difficult. She apparently has had a wound on the left lower leg for the last 6 months. She required admission to hospital in [facility] for apparent cellulitis just before Christmas was discharged home she has an open wound. She has a home health agency although we are not exactly sure which one. They are applying silver alginate. No compression Past medical history is reviewed she has chronic venous insufficiency and a history of left breast CA. 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 She did not have any compression on the wounds today. 2/8; substantial wound on the right posterior calf and a smaller area on the left medial calf. We are using Iodoflex to both wound areas under compression. We have better edema control We to find a new wound on the left anterior medial calf 2/15; the area on the left medial calf is closed over. Substantial area on the right posterior and lateral calf. We have been using Iodoflex although the patient complains of pain and wonders if there is not an alternative. She has home health changing the dressing twice a  week and she sees [place] once 2/22; the wound on the left medial calf is closed over. Her edema control is not good here but she cannot get on stockings. This substantial wound is on the right lateral and right posterior calf. Changed her to a hydrofera Blue last week. Gradual improvement in wound area. 3/8; wound on the left leg remains closed. The substantial wound is on the right lateral and right posterior calf. This is measuring smaller 3/22; 2-week follow-up. Substantial area on the right posterior and lateral calf measures slightly smaller. We are using Hydrofera Blue under compression 4/5; 2-week follow-up. Substantial area on the right posterior lateral calf. This measures slightly smaller. She has islands of epithelialization we have been using Hydrofera Blue under compression 4/19; 2-week follow-up. We have been using Hydrofera Blue however there is maceration around the wound. I will change to silver alginate today. She has islands of epithelialization however I did not see much evidence that things were improving here. 5/6; 2-week follow-up. I put silver alginate on this because of maceration around the wound last time. However she comes in today with a lot of very adherent debris on the wound of the right lateral leg. 5/17; 2-week follow-up. Using Hydrofera Blue under compression. The patient has epithelialization including islands of  epithelialization in the middle of fairly substantial wound area on the right posterior lateral calf 6/1; 2-week follow-up. Using Hydrofera Blue under compression. She seems to have increasing epithelialization. This is an irregular wound going from medial to lateral posteriorly in the right posterior calf. This would not be easy wound to get reproducible measurement 08/25/2019 upon evaluation today patient appears to actually be doing better in regard to the wound on her leg at this point. We have been using Adaptic followed by [facility] this is keeping  her from getting stuck. The patient is aware that this is also potentially slowing down her healing simply due to the fact that the [facility] not in direct contact with the wound bed. Nonetheless she was not happy with the way it was sticking previous which is why we switched to this according to what I can read in the note and hear from the patient and her aide with her today. 6/28; 2-week follow-up. We are following this patient for severe wound on the right lower leg secondary to chronic venous insufficiency using Hydrofera Blue under compression we are making gradual improvements. She arrives in clinic today with what looks like a contusion on the left anterior mid tibia. She says she was washing her leg and pulled some skin. However there is a fairly large wound with surrounding erythema and some swelling 7/12; 2-week follow-up. Right lower leg wound not quite as good as I thought. There is still islands of epithelialization we are using Hydrofera Blue with a contact layer I would like to see if we can get rid of the contact layer at this point. The new area from last time is a lot smaller She did not take the cephalexin I initially prescribed for the erythema edema around the left leg due to fears about diarrhea. She did tolerate doxycycline that we subsequently called in 7/26; we are doing 2-week follow-up. Wound measurements are about the same. Still the same fibrinous debris we have been using Hydrofera Blue. Change to Iodoflex today. 8/17; been using Iodoflex. Some improvement in the surface area of the large wound on the right posterior calf left anterior actually slightly larger. 100% [name] covered although it is less adherent 9/seven 3-week follow-up. Been using Iodoflex. We still have continued improvement in the surface area of the large wound on the right calf. The small area on the left anteriorly still is not closed. 01/14/2020 on evaluation today patient appears to be doing  decently well in regard to her leg ulcer. Fortunately there is no signs of active infection at this time. With that being said the wound on the right leg laterally is still open and given her trouble. Fortunately there is no signs of active infection at this time. No fevers, chills, nausea, vomiting, or diarrhea. 11/18; I have not seen this woman in quite some time. She told me she had Covid in September. She has chronic venous insufficiency with lymphedema and initially had wounds on her right lateral and left anterior lower legs. The left anterior leg is closed she is wearing a stocking 02/11/2020 upon evaluation today patient appears to be doing well with regard to her leg ulcer. This appears to be somewhat dry which is good although I think the Iodoflex is probably keeping this much too dry at this point. I think it may be time to switch to something different since it has accomplish the goal and we were kind of looking for. Fortunately there is no signs of  active infection at this time. 02/25/2020 on evaluation today patient appears to be doing well with regard to her wound. She has been tolerating the dressing changes without complication. Fortunately there is no signs of active infection at this time. No fevers, chills, nausea, vomiting, or diarrhea. 03/17/2020 upon evaluation today patient's wound on the right leg seems to be doing quite well. I am very pleased with where things stand today. There does not appear to be any evidence of infection and overall I think she is managing quite nicely at this point. She is pleased. With that being said she does have some swelling of the left leg which is quite significant and again she has not really been checked for a possibility of a blood clot I think we may need to check a venous Doppler study. 03/24/2020 upon evaluation today patient appears to be doing quite well with regard to her wounds currently. Fortunately there is no signs of active infection  at this time. No fever or chills noted. With that being said she still has a lot of edema of the left leg the DVT study was negative which is great news. I am very pleased in that regard. With that being said I do believe that the patient likely needs compression stockings in this area and to make sure that she keeps edema to control so she does not end up with issues. As well. Fortunately there is no signs of active infection at this time. Electronic Signature(s) Signed: 03/24/2020 4:08:32 PM By: Worthy Keeler PA-C Entered By: Worthy Keeler on 03/24/2020 16:08:31 -------------------------------------------------------------------------------- Physical Exam Details Patient Name: Date of Service: Erin [name] [name] 03/24/2020 2:45 PM Medical Record Number: [id number] Patient Account Number: [id number] Date of Birth/Sex: Treating RN: 1930/12/27 (87 y.o. [name] Primary Care Provider: [name] Other Clinician: Referring Provider: Treating Provider/Extender: [name] in Treatment: 46 Constitutional Well-nourished and well-hydrated in no acute distress. Respiratory normal breathing without difficulty. Psychiatric this patient is able to make decisions and demonstrates good insight into disease process. Alert and Oriented x 3. pleasant and cooperative. Notes Upon inspection patient's wound bed actually showed signs of good granulation and epithelization currently. There does not appear to be any signs of infection and I think that the collagen is doing well this is just very slow to heal but nonetheless is making progress. Electronic Signature(s) Signed: 03/24/2020 4:09:10 PM By: Worthy Keeler PA-C Entered By: Worthy Keeler on 03/24/2020 16:09:10 -------------------------------------------------------------------------------- Physician Orders Details Patient Name: Date of Service: Erin [name] [name] 03/24/2020 2:45 PM Medical  Record Number: [id number] Patient Account Number: [id number] Date of Birth/Sex: Treating RN: 24-Jan-1931 (87 y.o. [name] Primary Care Provider: [name] Other Clinician: Referring Provider: Treating Provider/Extender: [name] in Treatment: 47 Verbal / Phone Orders: No Diagnosis Coding ICD-10 Coding Code Description I87.331 Chronic venous hypertension (idiopathic) with ulcer and inflammation of right lower extremity L97.812 Non-pressure chronic ulcer of other part of right lower leg with fat layer exposed I89.0 Lymphedema, not elsewhere classified Follow-up Appointments Return Appointment in 2 weeks. Bathing/ Shower/ Hygiene May shower with protection but do not get wound dressing(s) wet. Edema Control - Lymphedema / SCD / Other Bilateral Lower Extremities Elevate legs to the level of the heart or above for 30 minutes daily and/or when sitting, a frequency of: - throughout the day Avoid standing for long periods of time. Exercise regularly Compression stocking or  Garment 20-30 mm/Hg pressure to: - left leg daily Home Health No change in wound care orders this week; continue Home Health for wound care. May utilize formulary equivalent dressing for wound treatment orders unless otherwise specified. Other Home Health Orders/Instructions: - [facility] Wound Treatment Wound #6 - Lower Leg Wound Laterality: Right Cleanser: Wound Cleanser (Home Health) 2 x Per Week/30 Days Discharge Instructions: Cleanse the wound with wound cleanser prior to applying a clean dressing using gauze sponges, not tissue or cotton balls. Peri-Wound Care: Sween Lotion (Moisturizing lotion) (Home Health) 2 x Per Week/30 Days Discharge Instructions: Apply moisturizing lotion as directed Prim Dressing: Promogran Prisma Matrix, 4.34 (sq in) (silver collagen) (Home Health) 2 x Per Week/30 Days ary Discharge Instructions: Moisten collagen with saline or hydrogel Secondary  Dressing: Woven Gauze Sponge, Non-Sterile 4x4 in (Home Health) 2 x Per Week/30 Days Discharge Instructions: Apply over primary dressing as directed. Compression Wrap: ThreePress (3 layer compression wrap) (Home Health) 2 x Per Week/30 Days Discharge Instructions: Apply three layer compression as directed. Electronic Signature(s) Signed: 03/24/2020 5:15:08 PM By: Worthy Keeler PA-C Signed: 03/24/2020 5:15:42 PM By: [name] RN, BSN Entered By: [name] on 03/24/2020 16:06:55 -------------------------------------------------------------------------------- Problem List Details Patient Name: Date of Service: Erin [name] [name] 03/24/2020 2:45 PM Medical Record Number: [id number] Patient Account Number: [id number] Date of Birth/Sex: Treating RN: 01-13-1931 (87 y.o. [name] Primary Care Provider: [name] Other Clinician: Referring Provider: Treating Provider/Extender: [name] in Treatment: 46 Active Problems ICD-10 Encounter Code Description Active Date MDM Diagnosis I87.331 Chronic venous hypertension (idiopathic) with ulcer and inflammation of right 04/14/2019 No Yes lower extremity L97.812 Non-pressure chronic ulcer of other part of right lower leg with fat layer 04/14/2019 No Yes exposed I89.0 Lymphedema, not elsewhere classified 04/14/2019 No Yes Inactive Problems ICD-10 Code Description Active Date Inactive Date L97.822 Non-pressure chronic ulcer of other part of left lower leg with fat layer exposed 09/08/2019 09/08/2019 L97.821 Non-pressure chronic ulcer of other part of left lower leg limited to breakdown of skin 04/21/2019 04/21/2019 L03.116 Cellulitis of left lower limb 09/08/2019 09/08/2019 Resolved Problems Electronic Signature(s) Signed: 03/24/2020 3:13:21 PM By: Worthy Keeler PA-C Entered By: Worthy Keeler on 03/24/2020  15:13:20 -------------------------------------------------------------------------------- Progress Note Details Patient Name: Date of Service: Erin [name] [name] 03/24/2020 2:45 PM Medical Record Number: [id number] Patient Account Number: [id number] Date of Birth/Sex: Treating RN: 1930-11-23 (87 y.o. [name] Primary Care Provider: [name] Other Clinician: Referring Provider: Treating Provider/Extender: [name] in Treatment: 52 Subjective Chief Complaint Information obtained from Patient 03/22/17; patient is here for review of wounds on her bilateral legs History of Present Illness (HPI) We to find a new wound on the left 03/22/17; this is an 87 year old woman. There is not a lot of information in care everywhere on this patient. She had a history of a malignant neoplasm her left breast for which she had lumpectomy but she did not have radiation. Apparently she has had long-standing edema and her lower legs and she was seen in the wound care center at [facility] in [place] by [name] October. Both her legs were wrapped however it sounds as though the left leg became secondarily infected she apparently had MRSA and she was admitted the hospital. Not really turn for wound care. She lives at home on her own and has apparently [facility] home health care. We are not exactly clear what [facility] is  doing to her legs but it does not involve compression. She is not a diabetic. ABIs in our clinic were noncompressible bilaterally 03/30/17; this is a patient who has bilateral chronic venous insufficiency, severe venous inflammation. We had a nice description number legs from a note by [name] dating 2010 describing edematous warm legs with erythema. This suggests that she has chronic venous insufficiency with inflammation.. We have arterial studies dated 12/29/16 again from [facility]. This showed a right ABI at 1 and a TBI of 0.81 on the  left the ABI was 1.25 and a TBI of 0.72. This was just she does not have significant arterial insufficiency. She had normal triphasic waveforms noted that she was admitted to hospital in late October with MSSA cellulitis Last visit we wrapped both her legs. She had an open area predominantly on the left anterior but weeping edema on the right anterior leg. She has done well there is only one at anterior left tibial wound 04/06/17; the patient has no open area on her right leg. Her remaining wound is on the left anterior leg, the left posterior leg wound is healed. We have been using silver alginate changed to [facility] on the left leg today she has home health 04/13/17; still no open area on her right leg. Her remaining wound on the left anterior leg. We have been using using Hydrofera Blue 04/20/17; no open area on the right leg although the edema here is somewhat disfiguring. Her remaining wound is on the left anterior leg we've been using Hydrofera Blue with improvement in dimensions. She has [facility] home health and lives in [place]. She requests to come every 2 weeks because of the distance involved in coming here 05/04/17; there is no open area on the right leg. She has a very small left anterior leg wound that remains. However what remains on the left still has some depth and a not to viable looking surface. It is too small to really attempt debridement. She has home health coming out to her home in [facility] 05/18/17; there is no open area on either leg. She has significant chronic venous insufficiency. The left anterior leg wound that was still open 2 weeks ago has closed. She has home health coming out. We are going to order Juxtalite stockings for both legs. She is agreed to pay for these privately. We will order them from [facility] READMISSION 04/14/2019 This is a patient we previously had in this clinic in 2019 for 3 months. She had a wound on the left anterior greater than right  anterior leg. According to my notes we discharged her and juxta lite stockings although it does not appear that she ever got them. The history here is a bit difficult. She apparently has had a wound on the left lower leg for the last 6 months. She required admission to hospital in [facility] for apparent cellulitis just before Christmas was discharged home she has an open wound. She has a home health agency although we are not exactly sure which one. They are applying silver alginate. No compression Past medical history is reviewed she has chronic venous insufficiency and a history of left breast CA. 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 She did not have any compression on the wounds today. 2/8; substantial wound on the right posterior calf and a smaller  area on the left medial calf. We are using Iodoflex to both wound areas under compression. We have better edema control We to find a new wound on the left anterior medial calf 2/15; the area on the left medial calf is closed over. Substantial area on the right posterior and lateral calf. We have been using Iodoflex although the patient complains of pain and wonders if there is not an alternative. She has home health changing the dressing twice a week and she sees [place] once 2/22; the wound on the left medial calf is closed over. Her edema control is not good here but she cannot get on stockings. This substantial wound is on the right lateral and right posterior calf. Changed her to a hydrofera Blue last week. Gradual improvement in wound area. 3/8; wound on the left leg remains closed. The substantial wound is on the right lateral and right posterior calf. This is measuring smaller 3/22; 2-week follow-up. Substantial area on the right posterior and lateral calf measures slightly smaller. We are using Hydrofera Blue under  compression 4/5; 2-week follow-up. Substantial area on the right posterior lateral calf. This measures slightly smaller. She has islands of epithelialization we have been using Hydrofera Blue under compression 4/19; 2-week follow-up. We have been using Hydrofera Blue however there is maceration around the wound. I will change to silver alginate today. She has islands of epithelialization however I did not see much evidence that things were improving here. 5/6; 2-week follow-up. I put silver alginate on this because of maceration around the wound last time. However she comes in today with a lot of very adherent debris on the wound of the right lateral leg. 5/17; 2-week follow-up. Using Hydrofera Blue under compression. The patient has epithelialization including islands of epithelialization in the middle of fairly substantial wound area on the right posterior lateral calf 6/1; 2-week follow-up. Using Hydrofera Blue under compression. She seems to have increasing epithelialization. This is an irregular wound going from medial to lateral posteriorly in the right posterior calf. This would not be easy wound to get reproducible measurement 08/25/2019 upon evaluation today patient appears to actually be doing better in regard to the wound on her leg at this point. We have been using Adaptic followed by [facility] this is keeping her from getting stuck. The patient is aware that this is also potentially slowing down her healing simply due to the fact that the [facility] not in direct contact with the wound bed. Nonetheless she was not happy with the way it was sticking previous which is why we switched to this according to what I can read in the note and hear from the patient and her aide with her today. 6/28; 2-week follow-up. We are following this patient for severe wound on the right lower leg secondary to chronic venous insufficiency using Hydrofera Blue under compression we are making gradual  improvements. She arrives in clinic today with what looks like a contusion on the left anterior mid tibia. She says she was washing her leg and pulled some skin. However there is a fairly large wound with surrounding erythema and some swelling 7/12; 2-week follow-up. Right lower leg wound not quite as good as I thought. There is still islands of epithelialization we are using Hydrofera Blue with a contact layer I would like to see if we can get rid of the contact layer at this point. ooThe new area from last time is a lot smaller ooShe did not take the cephalexin  I initially prescribed for the erythema edema around the left leg due to fears about diarrhea. She did tolerate doxycycline that we subsequently called in 7/26; we are doing 2-week follow-up. Wound measurements are about the same. Still the same fibrinous debris we have been using Hydrofera Blue. Change to Iodoflex today. 8/17; been using Iodoflex. Some improvement in the surface area of the large wound on the right posterior calf left anterior actually slightly larger. 100% [name] covered although it is less adherent 9/seven 3-week follow-up. Been using Iodoflex. We still have continued improvement in the surface area of the large wound on the right calf. The small area on the left anteriorly still is not closed. 01/14/2020 on evaluation today patient appears to be doing decently well in regard to her leg ulcer. Fortunately there is no signs of active infection at this time. With that being said the wound on the right leg laterally is still open and given her trouble. Fortunately there is no signs of active infection at this time. No fevers, chills, nausea, vomiting, or diarrhea. 11/18; I have not seen this woman in quite some time. She told me she had Covid in September. She has chronic venous insufficiency with lymphedema and initially had wounds on her right lateral and left anterior lower legs. The left anterior leg is closed she is  wearing a stocking 02/11/2020 upon evaluation today patient appears to be doing well with regard to her leg ulcer. This appears to be somewhat dry which is good although I think the Iodoflex is probably keeping this much too dry at this point. I think it may be time to switch to something different since it has accomplish the goal and we were kind of looking for. Fortunately there is no signs of active infection at this time. 02/25/2020 on evaluation today patient appears to be doing well with regard to her wound. She has been tolerating the dressing changes without complication. Fortunately there is no signs of active infection at this time. No fevers, chills, nausea, vomiting, or diarrhea. 03/17/2020 upon evaluation today patient's wound on the right leg seems to be doing quite well. I am very pleased with where things stand today. There does not appear to be any evidence of infection and overall I think she is managing quite nicely at this point. She is pleased. With that being said she does have some swelling of the left leg which is quite significant and again she has not really been checked for a possibility of a blood clot I think we may need to check a venous Doppler study. 03/24/2020 upon evaluation today patient appears to be doing quite well with regard to her wounds currently. Fortunately there is no signs of active infection at this time. No fever or chills noted. With that being said she still has a lot of edema of the left leg the DVT study was negative which is great news. I am very pleased in that regard. With that being said I do believe that the patient likely needs compression stockings in this area and to make sure that she keeps edema to control so she does not end up with issues. As well. Fortunately there is no signs of active infection at this time. Objective Constitutional Well-nourished and well-hydrated in no acute distress. Vitals Time Taken: 3:15 PM, Height: 60 in, Weight:  130 lbs, BMI: 25.4, Temperature: 97.7 F, Pulse: 77 bpm, Respiratory Rate: 16 breaths/min, Blood Pressure: 183/81 mmHg. Respiratory normal breathing without difficulty. Psychiatric this patient  is able to make decisions and demonstrates good insight into disease process. Alert and Oriented x 3. pleasant and cooperative. General Notes: Upon inspection patient's wound bed actually showed signs of good granulation and epithelization currently. There does not appear to be any signs of infection and I think that the collagen is doing well this is just very slow to heal but nonetheless is making progress. Integumentary (Hair, Skin) Wound #6 status is Open. Original cause of wound was Gradually Appeared. The wound is located on the Right Lower Leg. The wound measures 1.7cm length x 1.5cm width x 0.2cm depth; 2.003cm^2 area and 0.401cm^3 volume. There is Fat Layer (Subcutaneous Tissue) exposed. There is no tunneling or undermining noted. There is a medium amount of serosanguineous drainage noted. The wound margin is distinct with the outline attached to the wound base. There is large (67-100%) pink, pale granulation within the wound bed. There is a small (1-33%) amount of necrotic tissue within the wound bed including Adherent [name]. Assessment Active Problems ICD-10 Chronic venous hypertension (idiopathic) with ulcer and inflammation of right lower extremity Non-pressure chronic ulcer of other part of right lower leg with fat layer exposed Lymphedema, not elsewhere classified Procedures Wound #6 Pre-procedure diagnosis of Wound #6 is a Lymphedema located on the Right Lower Leg . There was a Three Layer Compression Therapy Procedure by Rhae Hammock, RN. Post procedure Diagnosis Wound #6: Same as Pre-Procedure Plan Follow-up Appointments: Return Appointment in 2 weeks. Bathing/ Shower/ Hygiene: May shower with protection but do not get wound dressing(s) wet. Edema Control - Lymphedema /  SCD / Other: Elevate legs to the level of the heart or above for 30 minutes daily and/or when sitting, a frequency of: - throughout the day Avoid standing for long periods of time. Exercise regularly Compression stocking or Garment 20-30 mm/Hg pressure to: - left leg daily Home Health: No change in wound care orders this week; continue Home Health for wound care. May utilize formulary equivalent dressing for wound treatment orders unless otherwise specified. Other Home Health Orders/Instructions: - [facility] WOUND #6: - Lower Leg Wound Laterality: Right Cleanser: Wound Cleanser (Home Health) 2 x Per Week/30 Days Discharge Instructions: Cleanse the wound with wound cleanser prior to applying a clean dressing using gauze sponges, not tissue or cotton balls. Peri-Wound Care: Sween Lotion (Moisturizing lotion) (Home Health) 2 x Per Week/30 Days Discharge Instructions: Apply moisturizing lotion as directed Prim Dressing: Promogran Prisma Matrix, 4.34 (sq in) (silver collagen) (Home Health) 2 x Per Week/30 Days ary Discharge Instructions: Moisten collagen with saline or hydrogel Secondary Dressing: Woven Gauze Sponge, Non-Sterile 4x4 in (Home Health) 2 x Per Week/30 Days Discharge Instructions: Apply over primary dressing as directed. Com pression Wrap: ThreePress (3 layer compression wrap) (Home Health) 2 x Per Week/30 Days Discharge Instructions: Apply three layer compression as directed. 1 open recommend currently that we go ahead and continue with the wound care measures as before with the silver collagen I think this is good. 2. We will also continue with the compression wrap for the right leg and that seems to be doing well. 3. I am also going to suggest that the patient go ahead and elevate her legs much as possible use her zipper compression stocking on the left leg and we will see how things do in 2 weeks time. We will see patient back for reevaluation in 2 weeks here in the clinic. If  anything worsens or changes patient will contact our office for additional recommendations. Electronic Signature(s) Signed:  03/24/2020 4:09:37 PM By: Worthy Keeler PA-C Entered By: Worthy Keeler on 03/24/2020 16:09:37 -------------------------------------------------------------------------------- SuperBill Details Patient Name: Date of Service: Erin [name] [name] 03/24/2020 Medical Record Number: [id number] Patient Account Number: [id number] Date of Birth/Sex: Treating RN: 03/08/1931 (87 y.o. [name], [name] Primary Care Provider: [name] Other Clinician: Referring Provider: Treating Provider/Extender: [name] in Treatment: 49 Diagnosis Coding ICD-10 Codes Code Description I87.331 Chronic venous hypertension (idiopathic) with ulcer and inflammation of right lower extremity L97.812 Non-pressure chronic ulcer of other part of right lower leg with fat layer exposed I89.0 Lymphedema, not elsewhere classified Facility Procedures CPT4 Code: [id number] Description: (Facility Use Only) [phone] - [place] L[facility] RT LEG Modifier: Quantity: 1 Physician Procedures : CPT4 Code Description Modifier [id number] - WC PHYS LEVEL 3 - EST PT ICD-10 Diagnosis Description I87.331 Chronic venous hypertension (idiopathic) with ulcer and inflammation of right lower extremity L97.812 Non-pressure chronic ulcer of other part  of right lower leg with fat layer exposed I89.0 Lymphedema, not elsewhere classified Quantity: 1 Electronic Signature(s) Signed: 03/24/2020 4:09:48 PM By: Worthy Keeler PA-C Entered By: Worthy Keeler on 03/24/2020 16:09:48

## 2020-03-24 NOTE — Progress Notes (Signed)
AYEISHA, LINDENBERGER ([id number]) Visit Report for 03/24/2020 Arrival Information Details Patient Name: Date of Service: Erin [name] [name] 03/24/2020 2:45 PM Medical Record Number: [id number] Patient Account Number: [id number] Date of Birth/Sex: Treating RN: March 19, 1930 (87 y.o. [name] Primary Care Auna Mikkelsen: [name] Other Clinician: Referring Anyssa Sharpless: Treating Tameka Hoiland/Extender: [name] in Treatment: 47 Visit Information History Since Last Visit Added or deleted any medications: No Patient Arrived: Ambulatory Any new allergies or adverse reactions: No Arrival Time: 15:10 Had a fall or experienced change in No Accompanied By: friend activities of daily living that may affect Transfer Assistance: Manual risk of falls: Patient Identification Verified: Yes Signs or symptoms of abuse/neglect since last visito No Secondary Verification Process Completed: Yes Hospitalized since last visit: No Patient Requires Transmission-Based Precautions: No Implantable device outside of the clinic excluding No Patient Has Alerts: Yes cellular tissue based products placed in the center Patient Alerts: NON COMPRESSABLE since last visit: Pain Present Now: No Electronic Signature(s) Signed: 03/24/2020 4:12:15 PM By: Mikeal Hawthorne EMT/HBOT/SD Entered By: Mikeal Hawthorne on 03/24/2020 15:15:16 -------------------------------------------------------------------------------- Compression Therapy Details Patient Name: Date of Service: Erin [name] [name] 03/24/2020 2:45 PM Medical Record Number: [id number] Patient Account Number: [id number] Date of Birth/Sex: Treating RN: 03-01-1931 (87 y.o. [name] Primary Care Deangela Randleman: [name] Other Clinician: Referring Aranza Geddes: Treating Bevely Hackbart/Extender: [name] in Treatment: 84 Compression Therapy Performed for Wound Assessment: Wound #6 Right Lower Leg Performed  By: Clinician Rhae Hammock, RN Compression Type: Three Layer Post Procedure Diagnosis Same as Pre-procedure Electronic Signature(s) Signed: 03/24/2020 5:15:42 PM By: [name] RN, BSN Entered By: [name] on 03/24/2020 15:57:01 -------------------------------------------------------------------------------- Encounter Discharge Information Details Patient Name: Date of Service: Erin [name] [name] 03/24/2020 2:45 PM Medical Record Number: [id number] Patient Account Number: [id number] Date of Birth/Sex: Treating RN: 08/31/1930 (87 y.o. [name], [name] Primary Care Jaysean Manville: [name] Other Clinician: Referring Ido Wollman: Treating Jahniah Pallas/Extender: [name] in Treatment: 89 Encounter Discharge Information Items Discharge Condition: Stable Ambulatory Status: Wheelchair Discharge Destination: Home Transportation: Private Auto Accompanied By: self Schedule Follow-up Appointment: Yes Clinical Summary of Care: Patient Declined Electronic Signature(s) Signed: 03/24/2020 5:04:31 PM By: Rhae Hammock RN Entered By: Rhae Hammock on 03/24/2020 16:11:13 -------------------------------------------------------------------------------- Lower Extremity Assessment Details Patient Name: Date of Service: Erin [name] [name] 03/24/2020 2:45 PM Medical Record Number: [id number] Patient Account Number: [id number] Date of Birth/Sex: Treating RN: Jul 06, 1930 (87 y.o. [name] Primary Care Nevada Kirchner: [name] Other Clinician: Referring Zvi Duplantis: Treating Derita Michelsen/Extender: [name] in Treatment: 49 Edema Assessment Assessed: [Left: No] [Right: No] Edema: [Left: Yes] [Right: No] Calf Left: Right: Point of Measurement: 35 cm From Medial Instep 32 cm Ankle Left: Right: Point of Measurement: 7 cm From Medial Instep 22 cm Vascular Assessment Pulses: Dorsalis Pedis Palpable:  [Right:Yes] Electronic Signature(s) Signed: 03/24/2020 4:12:15 PM By: Mikeal Hawthorne EMT/HBOT/SD Signed: 03/24/2020 5:15:42 PM By: [name] RN, BSN Entered By: Mikeal Hawthorne on 03/24/2020 15:17:31 -------------------------------------------------------------------------------- Multi-Disciplinary Care Plan Details Patient Name: Date of Service: Erin [name] [name] 03/24/2020 2:45 PM Medical Record Number: [id number] Patient Account Number: [id number] Date of Birth/Sex: Treating RN: 08-Nov-1930 (87 y.o. [name] Primary Care Percival Glasheen: [name] Other Clinician: Referring Jacori Mulrooney: Treating Adryana Mogensen/Extender: [name] in Treatment: 23 Active Inactive Venous Leg Ulcer Nursing Diagnoses: Actual venous Insuffiency (use after diagnosis is confirmed) Knowledge  deficit related to disease process and management Goals: Patient will maintain optimal edema control Date Initiated: 04/14/2019 Target Resolution Date: 04/16/2020 Goal Status: Active Patient/caregiver will verbalize understanding of disease process and disease management Date Initiated: 04/14/2019 Date Inactivated: 05/19/2019 Target Resolution Date: 05/16/2019 Goal Status: Met Interventions: Assess peripheral edema status every visit. Compression as ordered Provide education on venous insufficiency Notes: Wound/Skin Impairment Nursing Diagnoses: Impaired tissue integrity Knowledge deficit related to ulceration/compromised skin integrity Goals: Patient/caregiver will verbalize understanding of skin care regimen Date Initiated: 04/14/2019 Target Resolution Date: 04/16/2020 Goal Status: Active Ulcer/skin breakdown will have a volume reduction of 30% by week 4 Date Initiated: 04/14/2019 Date Inactivated: 05/19/2019 Target Resolution Date: 05/16/2019 Goal Status: Met Ulcer/skin breakdown will have a volume reduction of 50% by week 8 Date Initiated: 05/19/2019 Date Inactivated:  06/16/2019 Target Resolution Date: 06/20/2019 Goal Status: Met Interventions: Assess patient/caregiver ability to obtain necessary supplies Assess patient/caregiver ability to perform ulcer/skin care regimen upon admission and as needed Assess ulceration(s) every visit Provide education on ulcer and skin care Notes: Electronic Signature(s) Signed: 03/24/2020 5:15:42 PM By: [name] RN, BSN Entered By: [name] on 03/24/2020 15:59:28 -------------------------------------------------------------------------------- Pain Assessment Details Patient Name: Date of Service: Erin [name] [name] 03/24/2020 2:45 PM Medical Record Number: [id number] Patient Account Number: [id number] Date of Birth/Sex: Treating RN: February 16, 1931 (87 y.o. [name] Primary Care Naraly Fritcher: Other Clinician: [name] Referring Edmundo Tedesco: Treating Taylin Mans/Extender: [name] in Treatment: 53 Active Problems Location of Pain Severity and Description of Pain Patient Has Paino No Site Locations With Dressing Change: No Pain Management and Medication Current Pain Management: Electronic Signature(s) Signed: 03/24/2020 4:12:15 PM By: Mikeal Hawthorne EMT/HBOT/SD Signed: 03/24/2020 5:15:42 PM By: [name] RN, BSN Entered By: Mikeal Hawthorne on 03/24/2020 15:16:39 -------------------------------------------------------------------------------- Patient/Caregiver Education Details Patient Name: Date of Service: Erin [name] [name] 1/12/2022andnbsp2:45 PM Medical Record Number: [id number] Patient Account Number: [id number] Date of Birth/Gender: Treating RN: 26-May-1930 (87 y.o. [name] Primary Care Physician: [name] Other Clinician: Referring Physician: Treating Physician/Extender: [name] in Treatment: 71 Education Assessment Education Provided To: Patient Education Topics Provided Venous: Methods:  Explain/Verbal Responses: Reinforcements needed, State content correctly Wound/Skin Impairment: Methods: Explain/Verbal Responses: Reinforcements needed, State content correctly Electronic Signature(s) Signed: 03/24/2020 5:15:42 PM By: [name] RN, BSN Entered By: [name] on 03/24/2020 15:59:49 -------------------------------------------------------------------------------- Wound Assessment Details Patient Name: Date of Service: Erin [name] [name] 03/24/2020 2:45 PM Medical Record Number: [id number] Patient Account Number: [id number] Date of Birth/Sex: Treating RN: 1930/06/07 (87 y.o. [name], [name] Primary Care Kord Monette: [name] Other Clinician: Referring Stanford Strauch: Treating Dusty Wagoner/Extender: [name] in Treatment: 49 Wound Status Wound Number: 6 Primary Lymphedema Etiology: Wound Location: Right Lower Leg Wound Status: Open Wounding Event: Gradually Appeared Comorbid Cataracts, Glaucoma, Middle ear problems, Anemia, Date Acquired: 03/14/2019 History: Hypertension Weeks Of Treatment: 49 Clustered Wound: No Wound Measurements Length: (cm) 1.7 Width: (cm) 1.5 Depth: (cm) 0.2 Area: (cm) 2.003 Volume: (cm) 0.401 % Reduction in Area: 98.5% % Reduction in Volume: 99.3% Epithelialization: Medium (34-66%) Tunneling: No Undermining: No Wound Description Classification: Full Thickness Without Exposed Support Structures Wound Margin: Distinct, outline attached Exudate Amount: Medium Exudate Type: Serosanguineous Exudate Color: red, brown Foul Odor After Cleansing: No [name]/Fibrino Yes Wound Bed Granulation Amount: Large (67-100%) Exposed Structure Granulation Quality: Pink, Pale Fascia Exposed: No Necrotic Amount: Small (1-33%) Fat Layer (Subcutaneous Tissue) Exposed: Yes Necrotic Quality: Adherent  [name] Tendon Exposed: No Muscle Exposed: No Joint Exposed: No Bone Exposed: No Treatment Notes Wound #6 (Lower  Leg) Wound Laterality: Right Cleanser Wound Cleanser Discharge Instruction: Cleanse the wound with wound cleanser prior to applying a clean dressing using gauze sponges, not tissue or cotton balls. Peri-Wound Care Sween Lotion (Moisturizing lotion) Discharge Instruction: Apply moisturizing lotion as directed Topical Primary Dressing Promogran Prisma Matrix, 4.34 (sq in) (silver collagen) Discharge Instruction: Moisten collagen with saline or hydrogel Secondary Dressing Woven Gauze Sponge, Non-Sterile 4x4 in Discharge Instruction: Apply over primary dressing as directed. Secured With Compression Wrap ThreePress (3 layer compression wrap) Discharge Instruction: Apply three layer compression as directed. Compression Stockings Add-Ons Electronic Signature(s) Signed: 03/24/2020 4:12:15 PM By: Mikeal Hawthorne EMT/HBOT/SD Signed: 03/24/2020 5:15:42 PM By: [name] RN, BSN Entered By: Mikeal Hawthorne on 03/24/2020 15:21:18 -------------------------------------------------------------------------------- Vitals Details Patient Name: Date of Service: Erin [name] [name] 03/24/2020 2:45 PM Medical Record Number: [id number] Patient Account Number: [id number] Date of Birth/Sex: Treating RN: 1930/11/17 (87 y.o. [name] Primary Care Soo Steelman: [name] Other Clinician: Referring Cloee Dunwoody: Treating Taffany Heiser/Extender: [name] in Treatment: 80 Vital Signs Time Taken: 15:15 Temperature (F): 97.7 Height (in): 60 Pulse (bpm): 77 Weight (lbs): 130 Respiratory Rate (breaths/min): 16 Body Mass Index (BMI): 25.4 Blood Pressure (mmHg): 183/81 Reference Range: 80 - 120 mg / dl Electronic Signature(s) Signed: 03/24/2020 4:12:15 PM By: Mikeal Hawthorne EMT/HBOT/SD Entered By: Mikeal Hawthorne on 03/24/2020 15:15:38

## 2020-04-07 ENCOUNTER — Other Ambulatory Visit: Payer: Self-pay

## 2020-04-07 ENCOUNTER — Encounter (HOSPITAL_BASED_OUTPATIENT_CLINIC_OR_DEPARTMENT_OTHER): Payer: Medicare HMO | Admitting: Physician Assistant

## 2020-04-07 DIAGNOSIS — I87331 Chronic venous hypertension (idiopathic) with ulcer and inflammation of right lower extremity: Secondary | ICD-10-CM | POA: Diagnosis not present

## 2020-04-07 NOTE — Progress Notes (Addendum)
Erin [name] ([id number]) Visit Report for 04/07/2020 Chief Complaint Document Details Patient Name: Date of Service: Erin [name] [name] 04/07/2020 1:30 PM Medical Record Number: [id number] Patient Account Number: [id number] Date of Birth/Sex: Treating RN: [birth year] (87 y.o. [name] Primary Care Provider: Arsenio [name] Other Clinician: Referring Provider: Treating Provider/Extender: [name] in Treatment: 67 Information Obtained from: Patient Chief Complaint 03/22/17; patient is here for review of wounds on her bilateral legs Electronic Signature(s) Signed: 04/07/2020 1:21:00 PM By: Erin Keeler PA-C Entered By: Erin [name] on 04/07/2020 13:21:00 -------------------------------------------------------------------------------- HPI Details Patient Name: Date of Service: Erin [name] [name] 04/07/2020 1:30 PM Medical Record Number: [id number] Patient Account Number: [id number] Date of Birth/Sex: Treating RN: [birth year] (87 y.o. [name] Primary Care Provider: Arsenio [name] Other Clinician: Referring Provider: Treating Provider/Extender: [name] in Treatment: 63 History of Present Illness HPI Description: We to find a new wound on the left 03/22/17; this is an 87 year old woman. There is not a lot of information in care everywhere on this patient. She had a history of a malignant neoplasm her left breast for which she had lumpectomy but she did not have radiation. Apparently she has had long- standing edema and her lower legs and she was seen in the wound care center at [facility] in [place] by [name] October. Both her legs were wrapped however it sounds as though the left leg became secondarily infected she apparently had MRSA and she was admitted the hospital. Not really turn for wound care. She lives at home on her own and has apparently [facility] home health care. We are not  exactly clear what [facility] is doing to her legs but it does not involve compression. She is not a diabetic. ABIs in our clinic were noncompressible bilaterally 03/30/17; this is a patient who has bilateral chronic venous insufficiency, severe venous inflammation. We had a nice description number legs from a note by Dr. Thurnell [name] dating 2010 describing edematous warm legs with erythema. This suggests that she has chronic venous insufficiency with inflammation.. We have arterial studies dated 12/29/16 again from [facility]. This showed a right ABI at 1 and a TBI of 0.81 on the left the ABI was 1.25 and a TBI of 0.72. This was just she does not have significant arterial insufficiency. She had normal triphasic waveforms noted that she was admitted to hospital in late October with MSSA cellulitis Last visit we wrapped both her legs. She had an open area predominantly on the left anterior but weeping edema on the right anterior leg. She has done well there is only one at anterior left tibial wound 04/06/17; the patient has no open area on her right leg. Her remaining wound is on the left anterior leg, the left posterior leg wound is healed. We have been using silver alginate changed to [facility] on the left leg today she has home health 04/13/17; still no open area on her right leg. Her remaining wound on the left anterior leg. We have been using using Hydrofera Blue 04/20/17; no open area on the right leg although the edema here is somewhat disfiguring. Her remaining wound is on the left anterior leg we've been using Hydrofera Blue with improvement in dimensions. She has [facility] home health and lives in [place]. She requests to come every 2 weeks because of the distance involved in coming here 05/04/17; there is no open area  on the right leg. She has a very small left anterior leg wound that remains. However what remains on the left still has some depth and a not to viable looking  surface. It is too small to really attempt debridement. She has home health coming out to her home in [facility] 05/18/17; there is no open area on either leg. She has significant chronic venous insufficiency. The left anterior leg wound that was still open 2 weeks ago has closed. She has home health coming out. We are going to order Juxtalite stockings for both legs. She is agreed to pay for these privately. We will order them from [facility] READMISSION 04/14/2019 This is a patient we previously had in this clinic in 2019 for 3 months. She had a wound on the left anterior greater than right anterior leg. According to my notes we discharged her and juxta lite stockings although it does not appear that she ever got them. The history here is a bit difficult. She apparently has had a wound on the left lower leg for the last 6 months. She required admission to hospital in [facility] for apparent cellulitis just before Christmas was discharged home she has an open wound. She has a home health agency although we are not exactly sure which one. They are applying silver alginate. No compression Past medical history is reviewed she has chronic venous insufficiency and a history of left breast CA. 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 She did not have any compression on the wounds today. 2/8; substantial wound on the right posterior calf and a smaller area on the left medial calf. We are using Iodoflex to both wound areas under compression. We have better edema control We to find a new wound on the left anterior medial calf 2/15; the area on the left medial calf is closed over. Substantial area on the right posterior and lateral calf. We have been using Iodoflex although the patient complains of pain and wonders if there is not an alternative. She has home health changing the dressing twice a  week and she sees [place] once 2/22; the wound on the left medial calf is closed over. Her edema control is not good here but she cannot get on stockings. This substantial wound is on the right lateral and right posterior calf. Changed her to a hydrofera Blue last week. Gradual improvement in wound area. 3/8; wound on the left leg remains closed. The substantial wound is on the right lateral and right posterior calf. This is measuring smaller 3/22; 2-week follow-up. Substantial area on the right posterior and lateral calf measures slightly smaller. We are using Hydrofera Blue under compression 4/5; 2-week follow-up. Substantial area on the right posterior lateral calf. This measures slightly smaller. She has islands of epithelialization we have been using Hydrofera Blue under compression 4/19; 2-week follow-up. We have been using Hydrofera Blue however there is maceration around the wound. I will change to silver alginate today. She has islands of epithelialization however I did not see much evidence that things were improving here. 5/6; 2-week follow-up. I put silver alginate on this because of maceration around the wound last time. However she comes in today with a lot of very adherent debris on the wound of the right lateral leg. 5/17; 2-week follow-up. Using Hydrofera Blue under compression. The patient has epithelialization including islands of  epithelialization in the middle of fairly substantial wound area on the right posterior lateral calf 6/1; 2-week follow-up. Using Hydrofera Blue under compression. She seems to have increasing epithelialization. This is an irregular wound going from medial to lateral posteriorly in the right posterior calf. This would not be easy wound to get reproducible measurement 08/25/2019 upon evaluation today patient appears to actually be doing better in regard to the wound on her leg at this point. We have been using Adaptic followed by [facility] this is keeping  her from getting stuck. The patient is aware that this is also potentially slowing down her healing simply due to the fact that the [facility] not in direct contact with the wound bed. Nonetheless she was not happy with the way it was sticking previous which is why we switched to this according to what I can read in the note and hear from the patient and her aide with her today. 6/28; 2-week follow-up. We are following this patient for severe wound on the right lower leg secondary to chronic venous insufficiency using Hydrofera Blue under compression we are making gradual improvements. She arrives in clinic today with what looks like a contusion on the left anterior mid tibia. She says she was washing her leg and pulled some skin. However there is a fairly large wound with surrounding erythema and some swelling 7/12; 2-week follow-up. Right lower leg wound not quite as good as I thought. There is still islands of epithelialization we are using Hydrofera Blue with a contact layer I would like to see if we can get rid of the contact layer at this point. The new area from last time is a lot smaller She did not take the cephalexin I initially prescribed for the erythema edema around the left leg due to fears about diarrhea. She did tolerate doxycycline that we subsequently called in 7/26; we are doing 2-week follow-up. Wound measurements are about the same. Still the same fibrinous debris we have been using Hydrofera Blue. Change to Iodoflex today. 8/17; been using Iodoflex. Some improvement in the surface area of the large wound on the right posterior calf left anterior actually slightly larger. 100% [name] covered although it is less adherent 9/seven 3-week follow-up. Been using Iodoflex. We still have continued improvement in the surface area of the large wound on the right calf. The small area on the left anteriorly still is not closed. 01/14/2020 on evaluation today patient appears to be doing  decently well in regard to her leg ulcer. Fortunately there is no signs of active infection at this time. With that being said the wound on the right leg laterally is still open and given her trouble. Fortunately there is no signs of active infection at this time. No fevers, chills, nausea, vomiting, or diarrhea. 11/18; I have not seen this woman in quite some time. She told me she had Covid in September. She has chronic venous insufficiency with lymphedema and initially had wounds on her right lateral and left anterior lower legs. The left anterior leg is closed she is wearing a stocking 02/11/2020 upon evaluation today patient appears to be doing well with regard to her leg ulcer. This appears to be somewhat dry which is good although I think the Iodoflex is probably keeping this much too dry at this point. I think it may be time to switch to something different since it has accomplish the goal and we were kind of looking for. Fortunately there is no signs of  active infection at this time. 02/25/2020 on evaluation today patient appears to be doing well with regard to her wound. She has been tolerating the dressing changes without complication. Fortunately there is no signs of active infection at this time. No fevers, chills, nausea, vomiting, or diarrhea. 03/17/2020 upon evaluation today patient's wound on the right leg seems to be doing quite well. I am very pleased with where things stand today. There does not appear to be any evidence of infection and overall I think she is managing quite nicely at this point. She is pleased. With that being said she does have some swelling of the left leg which is quite significant and again she has not really been checked for a possibility of a blood clot I think we may need to check a venous Doppler study. 03/24/2020 upon evaluation today patient appears to be doing quite well with regard to her wounds currently. Fortunately there is no signs of active infection  at this time. No fever or chills noted. With that being said she still has a lot of edema of the left leg the DVT study was negative which is great news. I am very pleased in that regard. With that being said I do believe that the patient likely needs compression stockings in this area and to make sure that she keeps edema to control so she does not end up with issues. As well. Fortunately there is no signs of active infection at this time. 04/07/2020 on evaluation today patient appears to be doing okay in regard to her wound on the right lateral leg although the wrap hospital by the nurse yesterday apparently is not doing as well as we would like to see. It caused some kind of injury outside of exactly where the wound is and this again is new and unfortunate. With that being said there does not appear to be any signs of active infection which is great news. No fever chills noted Electronic Signature(s) Signed: 04/07/2020 3:40:20 PM By: Erin Keeler PA-C Entered By: Erin [name] on 04/07/2020 15:40:20 -------------------------------------------------------------------------------- Physical Exam Details Patient Name: Date of Service: Erin [name] [name] 04/07/2020 1:30 PM Medical Record Number: [id number] Patient Account Number: [id number] Date of Birth/Sex: Treating RN: [birth year] (87 y.o. [name] Primary Care Provider: Arsenio [name] Other Clinician: Referring Provider: Treating Provider/Extender: [name] in Treatment: 87 Constitutional Well-nourished and well-hydrated in no acute distress. Respiratory normal breathing without difficulty. Psychiatric this patient is able to make decisions and demonstrates good insight into disease process. Alert and Oriented x 3. pleasant and cooperative. Notes Upon inspection patient's wound bed actually showed signs of good granulation epithelization there does not appear to be any evidence of infection  although she does have significant swelling of left leg. She could not wear the zipper compression unfortunately. I do think we can look into a juxta light compression for her although I am not sure if she is going to like that either but nonetheless she has to have something long-term to help with her edema control. Electronic Signature(s) Signed: 04/07/2020 3:40:44 PM By: Erin Keeler PA-C Entered By: Erin [name] on 04/07/2020 15:40:43 -------------------------------------------------------------------------------- Physician Orders Details Patient Name: Date of Service: Erin [name] [name] 04/07/2020 1:30 PM Medical Record Number: [id number] Patient Account Number: [id number] Date of Birth/Sex: Treating RN: Aug 13, 1930 (87 y.o. 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 Bathing/ Shower/ Hygiene May shower with protection but do not get wound dressing(s) wet. Edema Control - Lymphedema / SCD / Other Bilateral Lower Extremities Elevate legs to the level of the heart or above for 30 minutes daily and/or when sitting, a frequency of: - throughout the day Avoid standing for long periods of time. Exercise regularly Other Edema Control Orders/Instructions: - 3 layer compression wrap left leg Home Health New wound care orders this week; continue Home Health for wound care. May utilize formulary equivalent dressing for wound treatment orders unless otherwise specified. - hold wound care until  seen in wound clinic next Wed 2/2 Other [place] Orders/Instructions: - [facility] Wound Treatment Wound #6 - Lower Leg Wound Laterality: Right Cleanser: Wound Cleanser ([place]) 1 x Per Week/30 Days Discharge Instructions: Cleanse the wound with wound cleanser prior to applying a clean dressing using gauze sponges, not tissue or cotton balls. Peri-Wound Care: Sween Lotion (Moisturizing lotion) (Home Health) 1 x Per Week/30 Days Discharge Instructions: Apply moisturizing lotion as directed Prim Dressing: KerraCel Ag Gelling Fiber Dressing, 4x5 in (silver alginate) 1 x Per Week/30 Days ary Discharge Instructions: Apply silver alginate to wound bed as instructed Secondary Dressing: Woven Gauze Sponge, Non-Sterile 4x4 in (Home Health) 1 x Per Week/30 Days Discharge Instructions: Apply over primary dressing as directed. Compression Wrap: ThreePress (3 layer compression wrap) (Home Health) 1 x Per Week/30 Days Discharge Instructions: Apply three layer compression as directed. Wound #9 - Lower Leg Wound Laterality: Right, Lateral, Distal Cleanser: Wound Cleanser (Home Health) 1 x Per Week/30 Days Discharge Instructions: Cleanse the wound with wound cleanser prior to applying a clean dressing using gauze sponges, not tissue or cotton balls. Peri-Wound Care: Sween Lotion (Moisturizing lotion) (Home Health) 1 x Per Week/30 Days Discharge Instructions: Apply moisturizing lotion as directed Prim Dressing: KerraCel Ag Gelling Fiber Dressing, 4x5 in (silver alginate) 1 x Per Week/30 Days ary Discharge Instructions: Apply silver alginate to wound bed as instructed Secondary Dressing: Woven Gauze Sponge, Non-Sterile 4x4 in (Home Health) 1 x Per Week/30 Days Discharge Instructions: Apply over primary dressing as directed. Compression Wrap: ThreePress (3 layer compression wrap) (Home Health) 1 x Per Week/30 Days Discharge Instructions: Apply three layer compression as directed. Electronic  Signature(s) Signed: 04/07/2020 5:03:34 PM By: Erin Keeler PA-C Signed: 04/07/2020 6:09:51 PM By: [name] RN, BSN Entered By: [name] on 04/07/2020 15:37:06 -------------------------------------------------------------------------------- Problem List Details Patient Name: Date of Service: Erin [name] [name] 04/07/2020 1:30 PM Medical Record Number: [id number] Patient Account Number: [id number] Date of Birth/Sex: Treating RN: 1931/03/09 (87 y.o. [name] Primary Care Provider: Arsenio [name] Other Clinician: Referring Provider: Treating Provider/Extender: [name] in Treatment: 40 Active Problems ICD-10 Encounter Code Description Active Date MDM Diagnosis I87.331 Chronic venous hypertension (idiopathic) with ulcer and inflammation of right 04/14/2019 No Yes lower extremity L97.812 Non-pressure chronic ulcer of other part of right lower leg with fat layer 04/14/2019 No Yes exposed I89.0 Lymphedema, not elsewhere classified 04/14/2019 No Yes Inactive Problems ICD-10 Code Description Active Date Inactive Date L97.822 Non-pressure chronic ulcer of other part of left lower leg with fat layer exposed 09/08/2019  09/08/2019 L97.821 Non-pressure chronic ulcer of other part of left lower leg limited to breakdown of skin 04/21/2019 04/21/2019 L03.116 Cellulitis of left lower limb 09/08/2019 09/08/2019 Resolved Problems Electronic Signature(s) Signed: 04/07/2020 1:20:55 PM By: Erin Keeler PA-C Entered By: Erin [name] on 04/07/2020 13:20:55 -------------------------------------------------------------------------------- Progress Note Details Patient Name: Date of Service: Erin [name] [name] 04/07/2020 1:30 PM Medical Record Number: [id number] Patient Account Number: [id number] Date of Birth/Sex: Treating RN: 1930/08/26 (87 y.o. [name] Primary Care Provider: Arsenio [name] Other Clinician: Referring Provider: Treating  Provider/Extender: [name] in Treatment: 44 Subjective Chief Complaint Information obtained from Patient 03/22/17; patient is here for review of wounds on her bilateral legs History of Present Illness (HPI) We to find a new wound on the left 03/22/17; this is an 87 year old woman. There is not a lot of information in care everywhere on this patient. She had a history of a malignant neoplasm her left breast for which she had lumpectomy but she did not have radiation. Apparently she has had long-standing edema and her lower legs and she was seen in the wound care center at [facility] in [place] by [name] October. Both her legs were wrapped however it sounds as though the left leg became secondarily infected she apparently had MRSA and she was admitted the hospital. Not really turn for wound care. She lives at home on her own and has apparently [facility] home health care. We are not exactly clear what [facility] is doing to her legs but it does not involve compression. She is not a diabetic. ABIs in our clinic were noncompressible bilaterally 03/30/17; this is a patient who has bilateral chronic venous insufficiency, severe venous inflammation. We had a nice description number legs from a note by Dr. Thurnell [name] dating 2010 describing edematous warm legs with erythema. This suggests that she has chronic venous insufficiency with inflammation.. We have arterial studies dated 12/29/16 again from [facility] [place]. This showed a right ABI at 1 and a TBI of 0.81 on the left the ABI was 1.25 and a TBI of 0.72. This was just she does not have significant arterial insufficiency. She had normal triphasic waveforms noted that she was admitted to hospital in late October with MSSA cellulitis Last visit we wrapped both her legs. She had an open area predominantly on the left anterior but weeping edema on the right anterior leg. She has done well there is only one at anterior left  tibial wound 04/06/17; the patient has no open area on her right leg. Her remaining wound is on the left anterior leg, the left posterior leg wound is healed. We have been using silver alginate changed to [facility] on the left leg today she has home health 04/13/17; still no open area on her right leg. Her remaining wound on the left anterior leg. We have been using using Hydrofera Blue 04/20/17; no open area on the right leg although the edema here is somewhat disfiguring. Her remaining wound is on the left anterior leg we've been using Hydrofera Blue with improvement in dimensions. She has [facility] home health and lives in [place]. She requests to come every 2 weeks because of the distance involved in coming here 05/04/17; there is no open area on the right leg. She has a very small left anterior leg wound that remains. However what remains on the left still has some depth and a not to viable looking surface. It is too  small to really attempt debridement. She has home health coming out to her home in [facility] 05/18/17; there is no open area on either leg. She has significant chronic venous insufficiency. The left anterior leg wound that was still open 2 weeks ago has closed. She has home health coming out. We are going to order Juxtalite stockings for both legs. She is agreed to pay for these privately. We will order them from [facility] READMISSION 04/14/2019 This is a patient we previously had in this clinic in 2019 for 3 months. She had a wound on the left anterior greater than right anterior leg. According to my notes we discharged her and juxta lite stockings although it does not appear that she ever got them. The history here is a bit difficult. She apparently has had a wound on the left lower leg for the last 6 months. She required admission to hospital in [facility] for apparent cellulitis just before Christmas was discharged home she has an open wound. She has a home health agency although we  are not exactly sure which one. They are applying silver alginate. No compression Past medical history is reviewed she has chronic venous insufficiency and a history of left breast CA. 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 She did not have any compression on the wounds today. 2/8; substantial wound on the right posterior calf and a smaller area on the left medial calf. We are using Iodoflex to both wound areas under compression. We have better edema control We to find a new wound on the left anterior medial calf 2/15; the area on the left medial calf is closed over. Substantial area on the right posterior and lateral calf. We have been using Iodoflex although the patient complains of pain and wonders if there is not an alternative. She has home health changing the dressing twice a week and she sees [place] once 2/22; the wound on the left medial calf is closed over. Her edema control is not good here but she cannot get on stockings. This substantial wound is on the right lateral and right posterior calf. Changed her to a hydrofera Blue last week. Gradual improvement in wound area. 3/8; wound on the left leg remains closed. The substantial wound is on the right lateral and right posterior calf. This is measuring smaller 3/22; 2-week follow-up. Substantial area on the right posterior and lateral calf measures slightly smaller. We are using Hydrofera Blue under compression 4/5; 2-week follow-up. Substantial area on the right posterior lateral calf. This measures slightly smaller. She has islands of epithelialization we have been using Hydrofera Blue under compression 4/19; 2-week follow-up. We have been using Hydrofera Blue however there is maceration around the wound. I will change to silver alginate today. She has islands of epithelialization however I did not see much  evidence that things were improving here. 5/6; 2-week follow-up. I put silver alginate on this because of maceration around the wound last time. However she comes in today with a lot of very adherent debris on the wound of the right lateral leg. 5/17; 2-week follow-up. Using Hydrofera Blue under compression. The patient has epithelialization including islands of epithelialization in the middle of fairly substantial wound area on the right posterior lateral calf 6/1; 2-week follow-up. Using Hydrofera Blue under compression. She seems to have increasing epithelialization. This is an irregular wound going  from medial to lateral posteriorly in the right posterior calf. This would not be easy wound to get reproducible measurement 08/25/2019 upon evaluation today patient appears to actually be doing better in regard to the wound on her leg at this point. We have been using Adaptic followed by [facility] this is keeping her from getting stuck. The patient is aware that this is also potentially slowing down her healing simply due to the fact that the [facility] not in direct contact with the wound bed. Nonetheless she was not happy with the way it was sticking previous which is why we switched to this according to what I can read in the note and hear from the patient and her aide with her today. 6/28; 2-week follow-up. We are following this patient for severe wound on the right lower leg secondary to chronic venous insufficiency using Hydrofera Blue under compression we are making gradual improvements. She arrives in clinic today with what looks like a contusion on the left anterior mid tibia. She says she was washing her leg and pulled some skin. However there is a fairly large wound with surrounding erythema and some swelling 7/12; 2-week follow-up. Right lower leg wound not quite as good as I thought. There is still islands of epithelialization we are using Hydrofera Blue with a contact layer I  would like to see if we can get rid of the contact layer at this point. ooThe new area from last time is a lot smaller ooShe did not take the cephalexin I initially prescribed for the erythema edema around the left leg due to fears about diarrhea. She did tolerate doxycycline that we subsequently called in 7/26; we are doing 2-week follow-up. Wound measurements are about the same. Still the same fibrinous debris we have been using Hydrofera Blue. Change to Iodoflex today. 8/17; been using Iodoflex. Some improvement in the surface area of the large wound on the right posterior calf left anterior actually slightly larger. 100% [name] covered although it is less adherent 9/seven 3-week follow-up. Been using Iodoflex. We still have continued improvement in the surface area of the large wound on the right calf. The small area on the left anteriorly still is not closed. 01/14/2020 on evaluation today patient appears to be doing decently well in regard to her leg ulcer. Fortunately there is no signs of active infection at this time. With that being said the wound on the right leg laterally is still open and given her trouble. Fortunately there is no signs of active infection at this time. No fevers, chills, nausea, vomiting, or diarrhea. 11/18; I have not seen this woman in quite some time. She told me she had Covid in September. She has chronic venous insufficiency with lymphedema and initially had wounds on her right lateral and left anterior lower legs. The left anterior leg is closed she is wearing a stocking 02/11/2020 upon evaluation today patient appears to be doing well with regard to her leg ulcer. This appears to be somewhat dry which is good although I think the Iodoflex is probably keeping this much too dry at this point. I think it may be time to switch to something different since it has accomplish the goal and we were kind of looking for. Fortunately there is no signs of active infection at  this time. 02/25/2020 on evaluation today patient appears to be doing well with regard to her wound. She has been tolerating the dressing changes without complication. Fortunately there is no signs of  active infection at this time. No fevers, chills, nausea, vomiting, or diarrhea. 03/17/2020 upon evaluation today patient's wound on the right leg seems to be doing quite well. I am very pleased with where things stand today. There does not appear to be any evidence of infection and overall I think she is managing quite nicely at this point. She is pleased. With that being said she does have some swelling of the left leg which is quite significant and again she has not really been checked for a possibility of a blood clot I think we may need to check a venous Doppler study. 03/24/2020 upon evaluation today patient appears to be doing quite well with regard to her wounds currently. Fortunately there is no signs of active infection at this time. No fever or chills noted. With that being said she still has a lot of edema of the left leg the DVT study was negative which is great news. I am very pleased in that regard. With that being said I do believe that the patient likely needs compression stockings in this area and to make sure that she keeps edema to control so she does not end up with issues. As well. Fortunately there is no signs of active infection at this time. 04/07/2020 on evaluation today patient appears to be doing okay in regard to her wound on the right lateral leg although the wrap hospital by the nurse yesterday apparently is not doing as well as we would like to see. It caused some kind of injury outside of exactly where the wound is and this again is new and unfortunate. With that being said there does not appear to be any signs of active infection which is great news. No fever chills noted Objective Constitutional Well-nourished and well-hydrated in no acute distress. Vitals Time Taken:  2:09 PM, Height: 60 in, Weight: 130 lbs, BMI: 25.4, Temperature: 97.8 F, Pulse: 66 bpm, Respiratory Rate: 16 breaths/min, Blood Pressure: 177/75 mmHg. Respiratory normal breathing without difficulty. Psychiatric this patient is able to make decisions and demonstrates good insight into disease process. Alert and Oriented x 3. pleasant and cooperative. General Notes: Upon inspection patient's wound bed actually showed signs of good granulation epithelization there does not appear to be any evidence of infection although she does have significant swelling of left leg. She could not wear the zipper compression unfortunately. I do think we can look into a juxta light compression for her although I am not sure if she is going to like that either but nonetheless she has to have something long-term to help with her edema control. Integumentary (Hair, Skin) Wound #6 status is Open. Original cause of wound was Gradually Appeared. The wound is located on the Right Lower Leg. The wound measures 1cm length x 0.3cm width x 0.1cm depth; 0.236cm^2 area and 0.024cm^3 volume. There is Fat Layer (Subcutaneous Tissue) exposed. There is no tunneling or undermining noted. There is a medium amount of serosanguineous drainage noted. The wound margin is distinct with the outline attached to the wound base. There is large (67-100%) pink, pale granulation within the wound bed. There is a small (1-33%) amount of necrotic tissue within the wound bed including Adherent [name]. Wound #9 status is Open. Original cause of wound was Gradually Appeared. The wound is located on the Right,Distal,Lateral Lower Leg. The wound measures 0.6cm length x 2.1cm width x 0.1cm depth; 0.99cm^2 area and 0.099cm^3 volume. There is Fat Layer (Subcutaneous Tissue) exposed. There is no tunneling or undermining noted.  There is a medium amount of serosanguineous drainage noted. The wound margin is epibole. There is large (67-100%) granulation within  the wound bed. There is no necrotic tissue within the wound bed. Assessment Active Problems ICD-10 Chronic venous hypertension (idiopathic) with ulcer and inflammation of right lower extremity Non-pressure chronic ulcer of other part of right lower leg with fat layer exposed Lymphedema, not elsewhere classified Procedures Wound #6 Pre-procedure diagnosis of Wound #6 is a Lymphedema located on the Right Lower Leg . There was a Three Layer Compression Therapy Procedure by Rhae Hammock, RN. Post procedure Diagnosis Wound #6: Same as Pre-Procedure There was a Three Layer Compression Therapy Procedure by Rhae Hammock, RN. Post procedure Diagnosis Wound #: Same as Pre-Procedure Plan Follow-up Appointments: Return Appointment in 1 week. Bathing/ Shower/ Hygiene: May shower with protection but do not get wound dressing(s) wet. Edema Control - Lymphedema / SCD / Other: Elevate legs to the level of the heart or above for 30 minutes daily and/or when sitting, a frequency of: - throughout the day Avoid standing for long periods of time. Exercise regularly Other Edema Control Orders/Instructions: - 3 layer compression wrap left leg Home Health: New wound care orders this week; continue Home Health for wound care. May utilize formulary equivalent dressing for wound treatment orders unless otherwise specified. - hold wound care until seen in wound clinic next Wed 2/2 Other [facility] Orders/Instructions: - [facility] WOUND #6: - Lower Leg Wound Laterality: Right Cleanser: Wound Cleanser (Herrin) 1 x Per Week/30 Days Discharge Instructions: Cleanse the wound with wound cleanser prior to applying a clean dressing using gauze sponges, not tissue or cotton balls. Peri-Wound Care: Sween Lotion (Moisturizing lotion) (Home Health) 1 x Per Week/30 Days Discharge Instructions: Apply moisturizing lotion as directed Prim Dressing: KerraCel Ag Gelling Fiber Dressing, 4x5 in (silver alginate) 1 x  Per Week/30 Days ary Discharge Instructions: Apply silver alginate to wound bed as instructed Secondary Dressing: Woven Gauze Sponge, Non-Sterile 4x4 in (Home Health) 1 x Per Week/30 Days Discharge Instructions: Apply over primary dressing as directed. Com pression Wrap: ThreePress (3 layer compression wrap) (Home Health) 1 x Per Week/30 Days Discharge Instructions: Apply three layer compression as directed. WOUND #9: - Lower Leg Wound Laterality: Right, Lateral, Distal Cleanser: Wound Cleanser (Home Health) 1 x Per Week/30 Days Discharge Instructions: Cleanse the wound with wound cleanser prior to applying a clean dressing using gauze sponges, not tissue or cotton balls. Peri-Wound Care: Sween Lotion (Moisturizing lotion) (Home Health) 1 x Per Week/30 Days Discharge Instructions: Apply moisturizing lotion as directed Prim Dressing: KerraCel Ag Gelling Fiber Dressing, 4x5 in (silver alginate) 1 x Per Week/30 Days ary Discharge Instructions: Apply silver alginate to wound bed as instructed Secondary Dressing: Woven Gauze Sponge, Non-Sterile 4x4 in (Home Health) 1 x Per Week/30 Days Discharge Instructions: Apply over primary dressing as directed. Com pression Wrap: ThreePress (3 layer compression wrap) (Home Health) 1 x Per Week/30 Days Discharge Instructions: Apply three layer compression as directed. 1. Would recommend currently that we continue with the wound care measures as before and the patient is in agreement with the plan. This includes the silver alginate dressing for the right leg. 2. We will get a go ahead and compression wrap both legs and we will just keep these wraps in place for the next week. If she does well with this then we will just plan to see her on a weekly basis in order to change these out and not have home health involved  in those dressing changes. We will see how she does. I do not think she is too far from complete resolution which is good news. We will see patient  back for reevaluation in 1 week here in the clinic. If anything worsens or changes patient will contact our office for additional recommendations. Electronic Signature(s) Signed: 04/07/2020 3:41:20 PM By: Erin Keeler PA-C Entered By: Erin [name] on 04/07/2020 15:41:20 -------------------------------------------------------------------------------- SuperBill Details Patient Name: Date of Service: Erin [name] [name] 04/07/2020 Medical Record Number: [id number] Patient Account Number: [id number] Date of Birth/Sex: Treating RN: [birth year] (87 y.o. [name] Primary Care Provider: Arsenio [name] Other Clinician: Referring Provider: Treating Provider/Extender: [name] in Treatment: 51 Diagnosis Coding ICD-10 Codes Code Description [phone] Chronic venous hypertension (idiopathic) with ulcer and inflammation of right lower extremity L97.812 Non-pressure chronic ulcer of other part of right lower leg with fat layer exposed I89.0 Lymphedema, not elsewhere classified Facility Procedures CPT4: Code [id number] foot Description: 1 BILATERAL: Application of multi-layer venous compression system; leg (below knee), including ankle and . Modifier: Quantity: 1 Physician Procedures : CPT4 Code Description Modifier [id number] - WC PHYS LEVEL 3 - EST PT ICD-10 Diagnosis Description I87.331 Chronic venous hypertension (idiopathic) with ulcer and inflammation of right lower extremity L97.812 Non-pressure chronic ulcer of other part  of right lower leg with fat layer exposed I89.0 Lymphedema, not elsewhere classified Quantity: 1 Electronic Signature(s) Signed: 04/07/2020 3:41:31 PM By: Erin Keeler PA-C Entered By: Erin [name] on 04/07/2020 15:41:31

## 2020-04-08 NOTE — Progress Notes (Signed)
Erin [name] ([id number]) Visit Report for 04/07/2020 Arrival Information Details Patient Name: Date of Service: Erin [name] [name] 04/07/2020 1:30 PM Medical Record Number: [id number] Patient Account Number: [id number] Date of Birth/Sex: Treating RN: 1930-05-14 (87 y.o. [name] Primary Care Charley Lafrance: [name] Other Clinician: Referring Kaven Cumbie: Treating Evana Runnels/Extender: [name] in Treatment: 69 Visit Information History Since Last Visit Added or deleted any medications: No Patient Arrived: Wheel Chair Any new allergies or adverse reactions: No Arrival Time: 14:08 Had a fall or experienced change in No Accompanied By: friend activities of daily living that may affect Transfer Assistance: None risk of falls: Patient Identification Verified: Yes Signs or symptoms of abuse/neglect since last visito No Secondary Verification Process Completed: Yes Hospitalized since last visit: No Patient Requires Transmission-Based Precautions: No Implantable device outside of the clinic excluding No Patient Has Alerts: Yes cellular tissue based products placed in the center Patient Alerts: NON COMPRESSABLE since last visit: Has Dressing in Place as Prescribed: Yes Pain Present Now: No Electronic Signature(s) Signed: 04/08/2020 3:17:01 PM By: Sandre Kitty Entered By: Sandre Kitty on 04/07/2020 14:09:06 -------------------------------------------------------------------------------- Compression Therapy Details Patient Name: Date of Service: Erin [name] [name] 04/07/2020 1:30 PM Medical Record Number: [id number] Patient Account Number: [id number] Date of Birth/Sex: Treating RN: 12-15-1930 (87 y.o. [name] Primary Care Tricha Ruggirello: [name] Other Clinician: Referring Phinley Schall: Treating Jermale Crass/Extender: [name] in Treatment: 82 Compression Therapy Performed for Wound Assessment:  Wound #6 Right Lower Leg Performed By: Clinician Rhae Hammock, RN Compression Type: Three Layer Post Procedure Diagnosis Same as Pre-procedure Electronic Signature(s) Signed: 04/07/2020 6:09:51 PM By: [name] RN, BSN Entered By: [name] on 04/07/2020 02:63:78 -------------------------------------------------------------------------------- Compression Therapy Details Patient Name: Date of Service: Erin [name] [name] 04/07/2020 1:30 PM Medical Record Number: [id number] Patient Account Number: [id number] Date of Birth/Sex: Treating RN: 1931/01/29 (87 y.o. [name] Primary Care Tonia Avino: [name] Other Clinician: Referring Olsen Mccutchan: Treating Damarion Mendizabal/Extender: [name] in Treatment: 58 Compression Therapy Performed for Wound Assessment: NonWound Condition Lymphedema - Left Leg Performed By: Clinician Rhae Hammock, RN Compression Type: Three Layer Post Procedure Diagnosis Same as Pre-procedure Electronic Signature(s) Signed: 04/07/2020 6:09:51 PM By: [name] RN, BSN Entered By: [name] on 04/07/2020 15:30:03 -------------------------------------------------------------------------------- Encounter Discharge Information Details Patient Name: Date of Service: Erin [name] [name] 04/07/2020 1:30 PM Medical Record Number: [id number] Patient Account Number: [id number] Date of Birth/Sex: Treating RN: 1931-02-17 (86 y.o. [name], Tammi Klippel Primary Care Andrya Roppolo: [name] Other Clinician: Referring Mickayla Trouten: Treating Channing Yeager/Extender: [name] in Treatment: 64 Encounter Discharge Information Items Discharge Condition: Stable Ambulatory Status: Wheelchair Discharge Destination: Home Transportation: Private Auto Accompanied By: caregiver Schedule Follow-up Appointment: Yes Clinical Summary of Care: Electronic Signature(s) Signed: 04/07/2020 6:11:14 PM By:  Deon Pilling Entered By: Deon Pilling on 04/07/2020 17:07:07 -------------------------------------------------------------------------------- Lower Extremity Assessment Details Patient Name: Date of Service: Erin [name] [name] 04/07/2020 1:30 PM Medical Record Number: [id number] Patient Account Number: [id number] Date of Birth/Sex: Treating RN: 1930/09/29 (87 y.o. [name], Tammi Klippel Primary Care Kaylon Laroche: [name] Other Clinician: Referring Daionna Crossland: Treating Amanada Philbrick/Extender: [name] in Treatment: 51 Edema Assessment Assessed: [Left: No] [Right: Yes] Edema: [Left: Yes] [Right: No] Calf Left: Right: Point of Measurement: 35 cm From Medial Instep 32 cm Ankle Left: Right: Point of Measurement: 7 cm From Medial Instep 22 cm  Vascular Assessment Pulses: Dorsalis Pedis Palpable: [Right:Yes] Electronic Signature(s) Signed: 04/07/2020 6:11:14 PM By: Deon Pilling Entered By: Deon Pilling on 04/07/2020 14:35:37 -------------------------------------------------------------------------------- [place] Details Patient Name: Date of Service: Erin [name] [name] 04/07/2020 1:30 PM Medical Record Number: [id number] Patient Account Number: [id number] Date of Birth/Sex: Treating RN: 1930/03/16 (87 y.o. [name] Primary Care Malike Foglio: [name] Other Clinician: Referring Jon Kasparek: Treating Jerelle Virden/Extender: [name] in Treatment: 66 Active Inactive Venous Leg Ulcer Nursing Diagnoses: Actual venous Insuffiency (use after diagnosis is confirmed) Knowledge deficit related to disease process and management Goals: Patient will maintain optimal edema control Date Initiated: 04/14/2019 Target Resolution Date: 04/16/2020 Goal Status: Active Patient/caregiver will verbalize understanding of disease process and disease management Date Initiated: 04/14/2019 Date Inactivated: 05/19/2019 Target  Resolution Date: 05/16/2019 Goal Status: Met Interventions: Assess peripheral edema status every visit. Compression as ordered Provide education on venous insufficiency Notes: Wound/Skin Impairment Nursing Diagnoses: Impaired tissue integrity Knowledge deficit related to ulceration/compromised skin integrity Goals: Patient/caregiver will verbalize understanding of skin care regimen Date Initiated: 04/14/2019 Target Resolution Date: 04/16/2020 Goal Status: Active Ulcer/skin breakdown will have a volume reduction of 30% by week 4 Date Initiated: 04/14/2019 Date Inactivated: 05/19/2019 Target Resolution Date: 05/16/2019 Goal Status: Met Ulcer/skin breakdown will have a volume reduction of 50% by week 8 Date Initiated: 05/19/2019 Date Inactivated: 06/16/2019 Target Resolution Date: 06/20/2019 Goal Status: Met Interventions: Assess patient/caregiver ability to obtain necessary supplies Assess patient/caregiver ability to perform ulcer/skin care regimen upon admission and as needed Assess ulceration(s) every visit Provide education on ulcer and skin care Notes: Electronic Signature(s) Signed: 04/07/2020 6:09:51 PM By: [name] RN, BSN Entered By: [name] on 04/07/2020 15:28:15 -------------------------------------------------------------------------------- Pain Assessment Details Patient Name: Date of Service: Erin [name] [name] 04/07/2020 1:30 PM Medical Record Number: [id number] Patient Account Number: [id number] Date of Birth/Sex: Treating RN: 12-May-1930 (87 y.o. [name] Primary Care Illona Bulman: [name] Other Clinician: Referring Sahiti Joswick: Treating Mikeyla Music/Extender: [name] in Treatment: 67 Active Problems Location of Pain Severity and Description of Pain Patient Has Paino No Site Locations Pain Management and Medication Current Pain Management: Electronic Signature(s) Signed: 04/07/2020 6:09:51 PM By: [name] RN,  BSN Signed: 04/08/2020 3:17:01 PM By: Sandre Kitty Entered By: Sandre Kitty on 04/07/2020 14:09:30 -------------------------------------------------------------------------------- Patient/Caregiver Education Details Patient Name: Date of Service: Erin [name] [name] 1/26/2022andnbsp1:30 PM Medical Record Number: [id number] Patient Account Number: [id number] Date of Birth/Gender: Treating RN: 04-25-1930 (87 y.o. [name] Primary Care Physician: [name] Other Clinician: Referring Physician: Treating Physician/Extender: [name] in Treatment: 19 Education Assessment Education Provided To: Patient Education Topics Provided Venous: Methods: Explain/Verbal Responses: Reinforcements needed, State content correctly Wound/Skin Impairment: Methods: Explain/Verbal Responses: Reinforcements needed, State content correctly Electronic Signature(s) Signed: 04/07/2020 6:09:51 PM By: [name] RN, BSN Entered By: [name] on 04/07/2020 15:28:35 -------------------------------------------------------------------------------- Wound Assessment Details Patient Name: Date of Service: Erin [name] [name] 04/07/2020 1:30 PM Medical Record Number: [id number] Patient Account Number: [id number] Date of Birth/Sex: Treating RN: 1931-02-18 (87 y.o. [name] Primary Care Avonelle Viveros: [name] Other Clinician: Referring Dohn Stclair: Treating Azarel Banner/Extender: [name] in Treatment: 51 Wound Status Wound Number: 6 Primary Lymphedema Etiology: Wound Location: Right Lower Leg Wound Status: Open Wounding Event: Gradually Appeared Comorbid Cataracts, Glaucoma, Middle ear problems, Anemia, Date Acquired: 03/14/2019 History: Hypertension Weeks Of Treatment: 51 Clustered Wound: No Photos  Photo Uploaded By: Mikeal Hawthorne on 04/08/2020 14:15:13 Wound Measurements Length: (cm) 1 Width: (cm)  0.3 Depth: (cm) 0.1 Area: (cm) 0.236 Volume: (cm) 0.024 Wound Description Classification: Full Thickness Without Exposed Support Struct Wound Margin: Distinct, outline attached Exudate Amount: Medium Exudate Type: Serosanguineous Exudate Color: red, brown Foul Odor After Cleansing: [name]/Fibrino % Reduction in Area: 99.8% % Reduction in Volume: 100% Epithelialization: Large (67-100%) Tunneling: No Undermining: No ures No Yes Wound Bed Granulation Amount: Large (67-100%) Exposed Structure Granulation Quality: Pink, Pale Fascia Exposed: No Necrotic Amount: Small (1-33%) Fat Layer (Subcutaneous Tissue) Exposed: Yes Necrotic Quality: Adherent [name] Tendon Exposed: No Muscle Exposed: No Joint Exposed: No Bone Exposed: No Treatment Notes Wound #6 (Lower Leg) Wound Laterality: Right Cleanser Wound Cleanser Discharge Instruction: Cleanse the wound with wound cleanser prior to applying a clean dressing using gauze sponges, not tissue or cotton balls. Peri-Wound Care Sween Lotion (Moisturizing lotion) Discharge Instruction: Apply moisturizing lotion as directed Topical Primary Dressing KerraCel Ag Gelling Fiber Dressing, 4x5 in (silver alginate) Discharge Instruction: Apply silver alginate to wound bed as instructed Secondary Dressing Woven Gauze Sponge, Non-Sterile 4x4 in Discharge Instruction: Apply over primary dressing as directed. Secured With Compression Wrap ThreePress (3 layer compression wrap) Discharge Instruction: Apply three layer compression as directed. Compression Stockings Add-Ons Notes left leg 3 layer compression wrap. Electronic Signature(s) Signed: 04/07/2020 6:09:51 PM By: [name] RN, BSN Signed: 04/07/2020 6:11:14 PM By: Deon Pilling Entered By: Deon Pilling on 04/07/2020 14:35:51 -------------------------------------------------------------------------------- Wound Assessment Details Patient Name: Date of Service: Erin [name]  [name] 04/07/2020 1:30 PM Medical Record Number: [id number] Patient Account Number: [id number] Date of Birth/Sex: Treating RN: October 20, 1930 (87 y.o. [name], [name] Primary Care Marrie Chandra: [name] Other Clinician: Referring Briante Loveall: Treating Soundra Lampley/Extender: [name] in Treatment: 51 Wound Status Wound Number: 9 Primary Venous Leg Ulcer Etiology: Wound Location: Right, Distal, Lateral Lower Leg Wound Status: Open Wounding Event: Gradually Appeared Comorbid Cataracts, Glaucoma, Middle ear problems, Anemia, Date Acquired: 04/07/2020 History: Hypertension Weeks Of Treatment: 0 Clustered Wound: No Photos Photo Uploaded By: Mikeal Hawthorne on 04/08/2020 14:15:17 Wound Measurements Length: (cm) 0.6 Width: (cm) 2.1 Depth: (cm) 0.1 Area: (cm) 0.99 Volume: (cm) 0.099 % Reduction in Area: 0% % Reduction in Volume: 0% Epithelialization: Small (1-33%) Tunneling: No Undermining: No Wound Description Classification: Full Thickness Without Exposed Support Structures Wound Margin: Epibole Exudate Amount: Medium Exudate Type: Serosanguineous Exudate Color: red, brown Foul Odor After Cleansing: No [name]/Fibrino No Wound Bed Granulation Amount: Large (67-100%) Exposed Structure Necrotic Amount: None Present (0%) Fascia Exposed: No Fat Layer (Subcutaneous Tissue) Exposed: Yes Tendon Exposed: No Muscle Exposed: No Joint Exposed: No Bone Exposed: No Treatment Notes Wound #9 (Lower Leg) Wound Laterality: Right, Lateral, Distal Cleanser Wound Cleanser Discharge Instruction: Cleanse the wound with wound cleanser prior to applying a clean dressing using gauze sponges, not tissue or cotton balls. Peri-Wound Care Sween Lotion (Moisturizing lotion) Discharge Instruction: Apply moisturizing lotion as directed Topical Primary Dressing KerraCel Ag Gelling Fiber Dressing, 4x5 in (silver alginate) Discharge Instruction: Apply silver alginate to  wound bed as instructed Secondary Dressing Woven Gauze Sponge, Non-Sterile 4x4 in Discharge Instruction: Apply over primary dressing as directed. Secured With Compression Wrap ThreePress (3 layer compression wrap) Discharge Instruction: Apply three layer compression as directed. Compression Stockings Add-Ons Notes left leg 3 layer compression wrap. Electronic Signature(s) Signed: 04/07/2020 6:09:51 PM By: [name] RN, BSN Signed: 04/07/2020 6:11:14 PM By: Deon Pilling Entered By: Deon Pilling on 04/07/2020 14:37:18 --------------------------------------------------------------------------------  Vitals Details Patient Name: Date of Service: Erin [name] [name] 04/07/2020 1:30 PM Medical Record Number: [id number] Patient Account Number: [id number] Date of Birth/Sex: Treating RN: 1930-12-31 (87 y.o. [name] Primary Care Orey Moure: [name] Other Clinician: Referring Robin Petrakis: Treating Kyren Vaux/Extender: [name] in Treatment: 67 Vital Signs Time Taken: 14:09 Temperature (F): 97.8 Height (in): 60 Pulse (bpm): 66 Weight (lbs): 130 Respiratory Rate (breaths/min): 16 Body Mass Index (BMI): 25.4 Blood Pressure (mmHg): 177/75 Reference Range: 80 - 120 mg / dl Electronic Signature(s) Signed: 04/08/2020 3:17:01 PM By: Sandre Kitty Entered By: Sandre Kitty on 04/07/2020 14:09:22

## 2020-04-09 ENCOUNTER — Ambulatory Visit: Payer: Medicare HMO | Admitting: Hematology and Oncology

## 2020-04-14 ENCOUNTER — Encounter (HOSPITAL_BASED_OUTPATIENT_CLINIC_OR_DEPARTMENT_OTHER): Payer: Medicare HMO | Attending: Physician Assistant | Admitting: Physician Assistant

## 2020-04-14 ENCOUNTER — Other Ambulatory Visit: Payer: Self-pay

## 2020-04-14 DIAGNOSIS — Z853 Personal history of malignant neoplasm of breast: Secondary | ICD-10-CM | POA: Diagnosis not present

## 2020-04-14 DIAGNOSIS — Z8616 Personal history of COVID-19: Secondary | ICD-10-CM | POA: Insufficient documentation

## 2020-04-14 DIAGNOSIS — Z8614 Personal history of Methicillin resistant Staphylococcus aureus infection: Secondary | ICD-10-CM | POA: Insufficient documentation

## 2020-04-14 DIAGNOSIS — I87331 Chronic venous hypertension (idiopathic) with ulcer and inflammation of right lower extremity: Secondary | ICD-10-CM | POA: Diagnosis not present

## 2020-04-14 DIAGNOSIS — L97812 Non-pressure chronic ulcer of other part of right lower leg with fat layer exposed: Secondary | ICD-10-CM | POA: Insufficient documentation

## 2020-04-14 DIAGNOSIS — I890 Lymphedema, not elsewhere classified: Secondary | ICD-10-CM | POA: Insufficient documentation

## 2020-04-14 NOTE — Progress Notes (Addendum)
TANIJAH, MORAIS ([id number]) Visit Report for 04/14/2020 Chief Complaint Document Details Patient Name: Date of Service: Erin [name] [name] 04/14/2020 2:15 PM Medical Record Number: [id number] Patient Account Number: [id number] Date of Birth/Sex: Treating RN: 02-12-1931 (87 y.o. Erin [name] Primary Care Provider: [name] Other Clinician: Referring Provider: Treating Provider/Extender: [name] in Treatment: 39 Information Obtained from: Patient Chief Complaint 03/22/17; patient is here for review of wounds on her bilateral legs Electronic Signature(s) Signed: 04/14/2020 2:41:57 PM By: Worthy Keeler PA-C Entered By: Worthy Keeler on 04/14/2020 14:41:57 -------------------------------------------------------------------------------- HPI Details Patient Name: Date of Service: Erin [name] [name] 04/14/2020 2:15 PM Medical Record Number: [id number] Patient Account Number: [id number] Date of Birth/Sex: Treating RN: [birth year] (87 y.o. Erin [name] Primary Care Provider: [name] Other Clinician: Referring Provider: Treating Provider/Extender: [name] in Treatment: 28 History of Present Illness HPI Description: We to find a new wound on the left 03/22/17; this is an 87 year old woman. There is not a lot of information in care everywhere on this patient. She had a history of a malignant neoplasm her left breast for which she had lumpectomy but she did not have radiation. Apparently she has had long- standing edema and her lower legs and she was seen in the wound care center at [facility] in [place] by [name] October. Both her legs were wrapped however it sounds as though the left leg became secondarily infected she apparently had MRSA and she was admitted the hospital. Not really turn for wound care. She lives at home on her own and has apparently [facility] home health care. We are not exactly  clear what [facility] is doing to her legs but it does not involve compression. She is not a diabetic. ABIs in our clinic were noncompressible bilaterally 03/30/17; this is a patient who has bilateral chronic venous insufficiency, severe venous inflammation. We had a nice description number legs from a note by [name] dating 2010 describing edematous warm legs with erythema. This suggests that she has chronic venous insufficiency with inflammation.. We have arterial studies dated 12/29/16 again from [facility]. This showed a right ABI at 1 and a TBI of 0.81 on the left the ABI was 1.25 and a TBI of 0.72. This was just she does not have significant arterial insufficiency. She had normal triphasic waveforms noted that she was admitted to hospital in late October with MSSA cellulitis Last visit we wrapped both her legs. She had an open area predominantly on the left anterior but weeping edema on the right anterior leg. She has done well there is only one at anterior left tibial wound 04/06/17; the patient has no open area on her right leg. Her remaining wound is on the left anterior leg, the left posterior leg wound is healed. We have been using silver alginate changed to [facility] on the left leg today she has home health 04/13/17; still no open area on her right leg. Her remaining wound on the left anterior leg. We have been using using Hydrofera Blue 04/20/17; no open area on the right leg although the edema here is somewhat disfiguring. Her remaining wound is on the left anterior leg we've been using Hydrofera Blue with improvement in dimensions. She has [facility] home health and lives in [place]. She requests to come every 2 weeks because of the distance involved in coming here 05/04/17; there is no open area  on the right leg. She has a very small left anterior leg wound that remains. However what remains on the left still has some depth and a not to viable looking surface. It  is too small to really attempt debridement. She has home health coming out to her home in [facility] 05/18/17; there is no open area on either leg. She has significant chronic venous insufficiency. The left anterior leg wound that was still open 2 weeks ago has closed. She has home health coming out. We are going to order Juxtalite stockings for both legs. She is agreed to pay for these privately. We will order them from [facility] READMISSION 04/14/2019 This is a patient we previously had in this clinic in 2019 for 3 months. She had a wound on the left anterior greater than right anterior leg. According to my notes we discharged her and juxta lite stockings although it does not appear that she ever got them. The history here is a bit difficult. She apparently has had a wound on the left lower leg for the last 6 months. She required admission to hospital in [facility] for apparent cellulitis just before Christmas was discharged home she has an open wound. She has a home health agency although we are not exactly sure which one. They are applying silver alginate. No compression Past medical history is reviewed she has chronic venous insufficiency and a history of left breast CA. 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 She did not have any compression on the wounds today. 2/8; substantial wound on the right posterior calf and a smaller area on the left medial calf. We are using Iodoflex to both wound areas under compression. We have better edema control We to find a new wound on the left anterior medial calf 2/15; the area on the left medial calf is closed over. Substantial area on the right posterior and lateral calf. We have been using Iodoflex although the patient complains of pain and wonders if there is not an alternative. She has home health changing the dressing twice a week and  she sees [place] once 2/22; the wound on the left medial calf is closed over. Her edema control is not good here but she cannot get on stockings. This substantial wound is on the right lateral and right posterior calf. Changed her to a hydrofera Blue last week. Gradual improvement in wound area. 3/8; wound on the left leg remains closed. The substantial wound is on the right lateral and right posterior calf. This is measuring smaller 3/22; 2-week follow-up. Substantial area on the right posterior and lateral calf measures slightly smaller. We are using Hydrofera Blue under compression 4/5; 2-week follow-up. Substantial area on the right posterior lateral calf. This measures slightly smaller. She has islands of epithelialization we have been using Hydrofera Blue under compression 4/19; 2-week follow-up. We have been using Hydrofera Blue however there is maceration around the wound. I will change to silver alginate today. She has islands of epithelialization however I did not see much evidence that things were improving here. 5/6; 2-week follow-up. I put silver alginate on this because of maceration around the wound last time. However she comes in today with a lot of very adherent debris on the wound of the right lateral leg. 5/17; 2-week follow-up. Using Hydrofera Blue under compression. The patient has epithelialization including islands of  epithelialization in the middle of fairly substantial wound area on the right posterior lateral calf 6/1; 2-week follow-up. Using Hydrofera Blue under compression. She seems to have increasing epithelialization. This is an irregular wound going from medial to lateral posteriorly in the right posterior calf. This would not be easy wound to get reproducible measurement 08/25/2019 upon evaluation today patient appears to actually be doing better in regard to the wound on her leg at this point. We have been using Adaptic followed by [facility] this is keeping her from  getting stuck. The patient is aware that this is also potentially slowing down her healing simply due to the fact that the [facility] not in direct contact with the wound bed. Nonetheless she was not happy with the way it was sticking previous which is why we switched to this according to what I can read in the note and hear from the patient and her aide with her today. 6/28; 2-week follow-up. We are following this patient for severe wound on the right lower leg secondary to chronic venous insufficiency using Hydrofera Blue under compression we are making gradual improvements. She arrives in clinic today with what looks like a contusion on the left anterior mid tibia. She says she was washing her leg and pulled some skin. However there is a fairly large wound with surrounding erythema and some swelling 7/12; 2-week follow-up. Right lower leg wound not quite as good as I thought. There is still islands of epithelialization we are using Hydrofera Blue with a contact layer I would like to see if we can get rid of the contact layer at this point. The new area from last time is a lot smaller She did not take the cephalexin I initially prescribed for the erythema edema around the left leg due to fears about diarrhea. She did tolerate doxycycline that we subsequently called in 7/26; we are doing 2-week follow-up. Wound measurements are about the same. Still the same fibrinous debris we have been using Hydrofera Blue. Change to Iodoflex today. 8/17; been using Iodoflex. Some improvement in the surface area of the large wound on the right posterior calf left anterior actually slightly larger. 100% [name] covered although it is less adherent 9/seven 3-week follow-up. Been using Iodoflex. We still have continued improvement in the surface area of the large wound on the right calf. The small area on the left anteriorly still is not closed. 01/14/2020 on evaluation today patient appears to be doing decently  well in regard to her leg ulcer. Fortunately there is no signs of active infection at this time. With that being said the wound on the right leg laterally is still open and given her trouble. Fortunately there is no signs of active infection at this time. No fevers, chills, nausea, vomiting, or diarrhea. 11/18; I have not seen this woman in quite some time. She told me she had Covid in September. She has chronic venous insufficiency with lymphedema and initially had wounds on her right lateral and left anterior lower legs. The left anterior leg is closed she is wearing a stocking 02/11/2020 upon evaluation today patient appears to be doing well with regard to her leg ulcer. This appears to be somewhat dry which is good although I think the Iodoflex is probably keeping this much too dry at this point. I think it may be time to switch to something different since it has accomplish the goal and we were kind of looking for. Fortunately there is no signs of  active infection at this time. 02/25/2020 on evaluation today patient appears to be doing well with regard to her wound. She has been tolerating the dressing changes without complication. Fortunately there is no signs of active infection at this time. No fevers, chills, nausea, vomiting, or diarrhea. 03/17/2020 upon evaluation today patient's wound on the right leg seems to be doing quite well. I am very pleased with where things stand today. There does not appear to be any evidence of infection and overall I think she is managing quite nicely at this point. She is pleased. With that being said she does have some swelling of the left leg which is quite significant and again she has not really been checked for a possibility of a blood clot I think we may need to check a venous Doppler study. 03/24/2020 upon evaluation today patient appears to be doing quite well with regard to her wounds currently. Fortunately there is no signs of active infection at this  time. No fever or chills noted. With that being said she still has a lot of edema of the left leg the DVT study was negative which is great news. I am very pleased in that regard. With that being said I do believe that the patient likely needs compression stockings in this area and to make sure that she keeps edema to control so she does not end up with issues. As well. Fortunately there is no signs of active infection at this time. 04/07/2020 on evaluation today patient appears to be doing okay in regard to her wound on the right lateral leg although the wrap hospital by the nurse yesterday apparently is not doing as well as we would like to see. It caused some kind of injury outside of exactly where the wound is and this again is new and unfortunate. With that being said there does not appear to be any signs of active infection which is great news. No fever chills noted 04/14/2020 upon evaluation today patient appears to be doing decently well in regard to her wounds. She has been tolerating the dressing changes without complication. Fortunately there does not appear to be any evidence of active infection at this time. No fevers, chills, nausea, vomiting, or diarrhea. She did well with the wraps staying on for the week and they state up no problems. With that being said she tells me that she just cannot come in every week here to the clinic. For that reason she wants [place] to get the home health nurses to come back out to see her. I can definitely do that but I am not going to continue to allow them to come out to see her if the wrap is not done appropriately. For that reason I do want her to make sure that it is done correctly. If it is not then we can need to just bring her in here weekly to make sure it is until we get this wound healed. Electronic Signature(s) Signed: 04/14/2020 3:41:34 PM By: Worthy Keeler PA-C Entered By: Worthy Keeler on 04/14/2020  15:41:34 -------------------------------------------------------------------------------- Physical Exam Details Patient Name: Date of Service: Erin [name] [name] 04/14/2020 2:15 PM Medical Record Number: [id number] Patient Account Number: [id number] Date of Birth/Sex: Treating RN: Dec 10, 1930 (87 y.o. Erin [name] Primary Care Provider: [name] Other Clinician: Referring Provider: Treating Provider/Extender: [name] in Treatment: 36 Constitutional Well-nourished and well-hydrated in no acute distress. Respiratory normal breathing without difficulty. Psychiatric this patient  is able to make decisions and demonstrates good insight into disease process. Alert and Oriented x 3. pleasant and cooperative. Notes Upon inspection patient's wound bed actually showed signs of fairly good granulation there is no signs of active infection at this time and overall I feel like she is actually doing quite well which is great news. There is no evidence of anything worsening and overall I think that the compression wrap for the week had no ill effects in general I think she did great. Electronic Signature(s) Signed: 04/14/2020 3:41:56 PM By: Worthy Keeler PA-C Entered By: Worthy Keeler on 04/14/2020 15:41:56 -------------------------------------------------------------------------------- Physician Orders Details Patient Name: Date of Service: Erin [name] [name] 04/14/2020 2:15 PM Medical Record Number: [id number] Patient Account Number: [id number] Date of Birth/Sex: Treating RN: [birth year] (87 y.o. 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 Bathing/ Shower/ Hygiene May shower with protection but do not get wound dressing(s) wet. Edema Control - Lymphedema / SCD / Other Bilateral Lower Extremities Elevate legs to the level of the heart or above for 30 minutes daily and/or when sitting, a frequency of: - throughout the day Avoid standing for long periods of time. Exercise regularly Compression stocking or Garment 20-30 mm/Hg pressure to: - left leg daily. Apply in the morning and remove at night. Home Health New wound care orders this week; continue Home Health for wound care. May utilize formulary equivalent dressing for wound treatment orders unless otherwise specified. - resume weekly wound care on Wed. Other Home Health Orders/Instructions: - [facility] Wound Treatment Wound #6 - Lower Leg Wound Laterality: Right Cleanser: Wound Cleanser (Home Health) 1 x Per Week/30 Days Discharge Instructions: Cleanse the wound with wound cleanser prior to applying a clean dressing using gauze sponges, not tissue or cotton balls. Peri-Wound Care: Sween Lotion (Moisturizing lotion) (Home Health) 1 x Per Week/30 Days Discharge Instructions: Apply moisturizing lotion as directed Prim Dressing: KerraCel Ag Gelling Fiber Dressing, 4x5 in (silver alginate) 1 x Per Week/30 Days ary Discharge Instructions: Apply silver alginate to wound bed as instructed Secondary Dressing: Woven Gauze Sponge, Non-Sterile 4x4 in (Home Health) 1 x Per Week/30 Days Discharge Instructions: Apply over primary dressing as directed. Compression Wrap: ThreePress (3 layer compression wrap) (Home Health) 1 x Per Week/30 Days Discharge Instructions: Apply three layer compression as directed. Electronic Signature(s) Signed: 04/14/2020 5:21:21 PM By: Worthy Keeler PA-C Signed:  04/14/2020 5:50:42 PM By: [name] RN, BSN Entered By: [name] on 04/14/2020 15:41:34 -------------------------------------------------------------------------------- Problem List Details Patient Name: Date of Service: Erin [name] [name] 04/14/2020 2:15 PM Medical Record Number: [id number] Patient Account Number: [id number] Date of Birth/Sex: Treating RN: 11-Feb-1931 (87 y.o. Erin [name] Primary Care Provider: [name] Other Clinician: Referring Provider: Treating Provider/Extender: [name] in Treatment: 58 Active Problems ICD-10 Encounter Code Description Active Date MDM Diagnosis I87.331 Chronic venous hypertension (idiopathic) with ulcer and inflammation of right 04/14/2019 No Yes lower extremity L97.812 Non-pressure chronic ulcer of other part of right lower leg with fat layer 04/14/2019 No Yes exposed I89.0 Lymphedema, not elsewhere classified  04/14/2019 No Yes Inactive Problems ICD-10 Code Description Active Date Inactive Date L97.822 Non-pressure chronic ulcer of other part of left lower leg with fat layer exposed 09/08/2019 09/08/2019 L97.821 Non-pressure chronic ulcer of other part of left lower leg limited to breakdown of skin 04/21/2019 04/21/2019 L03.116 Cellulitis of left lower limb 09/08/2019 09/08/2019 Resolved Problems Electronic Signature(s) Signed: 04/14/2020 2:41:43 PM By: Worthy Keeler PA-C Entered By: Worthy Keeler on 04/14/2020 14:41:43 -------------------------------------------------------------------------------- Progress Note Details Patient Name: Date of Service: Erin [name] [name] 04/14/2020 2:15 PM Medical Record Number: [id number] Patient Account Number: [id number] Date of Birth/Sex: Treating RN: 08-17-1930 (87 y.o. Erin [name] Primary Care Provider: [name] Other Clinician: Referring Provider: Treating Provider/Extender: [name] in Treatment:  38 Subjective Chief Complaint Information obtained from Patient 03/22/17; patient is here for review of wounds on her bilateral legs History of Present Illness (HPI) We to find a new wound on the left 03/22/17; this is an 87 year old woman. There is not a lot of information in care everywhere on this patient. She had a history of a malignant neoplasm her left breast for which she had lumpectomy but she did not have radiation. Apparently she has had long-standing edema and her lower legs and she was seen in the wound care center at [facility] in [place] by [name] October. Both her legs were wrapped however it sounds as though the left leg became secondarily infected she apparently had MRSA and she was admitted the hospital. Not really turn for wound care. She lives at home on her own and has apparently [facility] home health care. We are not exactly clear what [facility] is doing to her legs but it does not involve compression. She is not a diabetic. ABIs in our clinic were noncompressible bilaterally 03/30/17; this is a patient who has bilateral chronic venous insufficiency, severe venous inflammation. We had a nice description number legs from a note by [name] dating 2010 describing edematous warm legs with erythema. This suggests that she has chronic venous insufficiency with inflammation.. We have arterial studies dated 12/29/16 again from [facility]. This showed a right ABI at 1 and a TBI of 0.81 on the left the ABI was 1.25 and a TBI of 0.72. This was just she does not have significant arterial insufficiency. She had normal triphasic waveforms noted that she was admitted to hospital in late October with MSSA cellulitis Last visit we wrapped both her legs. She had an open area predominantly on the left anterior but weeping edema on the right anterior leg. She has done well there is only one at anterior left tibial wound 04/06/17; the patient has no open area on her right leg.  Her remaining wound is on the left anterior leg, the left posterior leg wound is healed. We have been using silver alginate changed to [facility] on the left leg today she has home health 04/13/17; still no open area on her right leg. Her remaining wound on the left anterior leg. We have been using using Hydrofera Blue 04/20/17; no open area on the right leg although the edema here is somewhat disfiguring. Her remaining wound is on the left anterior leg we've been using Hydrofera Blue with improvement in dimensions. She has [facility] home health and lives in [place]. She requests to come every 2 weeks because of the distance involved in coming here 05/04/17; there is no open area on the right leg. She has a  very small left anterior leg wound that remains. However what remains on the left still has some depth and a not to viable looking surface. It is too small to really attempt debridement. She has home health coming out to her home in [facility] 05/18/17; there is no open area on either leg. She has significant chronic venous insufficiency. The left anterior leg wound that was still open 2 weeks ago has closed. She has home health coming out. We are going to order Juxtalite stockings for both legs. She is agreed to pay for these privately. We will order them from [facility] READMISSION 04/14/2019 This is a patient we previously had in this clinic in 2019 for 3 months. She had a wound on the left anterior greater than right anterior leg. According to my notes we discharged her and juxta lite stockings although it does not appear that she ever got them. The history here is a bit difficult. She apparently has had a wound on the left lower leg for the last 6 months. She required admission to hospital in [facility] for apparent cellulitis just before Christmas was discharged home she has an open wound. She has a home health agency although we are not exactly sure which one. They are applying silver alginate.  No compression Past medical history is reviewed she has chronic venous insufficiency and a history of left breast CA. 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 She did not have any compression on the wounds today. 2/8; substantial wound on the right posterior calf and a smaller area on the left medial calf. We are using Iodoflex to both wound areas under compression. We have better edema control We to find a new wound on the left anterior medial calf 2/15; the area on the left medial calf is closed over. Substantial area on the right posterior and lateral calf. We have been using Iodoflex although the patient complains of pain and wonders if there is not an alternative. She has home health changing the dressing twice a week and she sees [place] once 2/22; the wound on the left medial calf is closed over. Her edema control is not good here but she cannot get on stockings. This substantial wound is on the right lateral and right posterior calf. Changed her to a hydrofera Blue last week. Gradual improvement in wound area. 3/8; wound on the left leg remains closed. The substantial wound is on the right lateral and right posterior calf. This is measuring smaller 3/22; 2-week follow-up. Substantial area on the right posterior and lateral calf measures slightly smaller. We are using Hydrofera Blue under compression 4/5; 2-week follow-up. Substantial area on the right posterior lateral calf. This measures slightly smaller. She has islands of epithelialization we have been using Hydrofera Blue under compression 4/19; 2-week follow-up. We have been using Hydrofera Blue however there is maceration around the wound. I will change to silver alginate today. She has islands of epithelialization however I did not see much evidence that things were improving here. 5/6; 2-week follow-up. I put  silver alginate on this because of maceration around the wound last time. However she comes in today with a lot of very adherent debris on the wound of the right lateral leg. 5/17; 2-week follow-up. Using Hydrofera Blue under compression. The patient has epithelialization including islands of epithelialization in the middle of fairly substantial  wound area on the right posterior lateral calf 6/1; 2-week follow-up. Using Hydrofera Blue under compression. She seems to have increasing epithelialization. This is an irregular wound going from medial to lateral posteriorly in the right posterior calf. This would not be easy wound to get reproducible measurement 08/25/2019 upon evaluation today patient appears to actually be doing better in regard to the wound on her leg at this point. We have been using Adaptic followed by [facility] this is keeping her from getting stuck. The patient is aware that this is also potentially slowing down her healing simply due to the fact that the [facility] not in direct contact with the wound bed. Nonetheless she was not happy with the way it was sticking previous which is why we switched to this according to what I can read in the note and hear from the patient and her aide with her today. 6/28; 2-week follow-up. We are following this patient for severe wound on the right lower leg secondary to chronic venous insufficiency using Hydrofera Blue under compression we are making gradual improvements. She arrives in clinic today with what looks like a contusion on the left anterior mid tibia. She says she was washing her leg and pulled some skin. However there is a fairly large wound with surrounding erythema and some swelling 7/12; 2-week follow-up. Right lower leg wound not quite as good as I thought. There is still islands of epithelialization we are using Hydrofera Blue with a contact layer I would like to see if we can get rid of the contact layer at this  point. ooThe new area from last time is a lot smaller ooShe did not take the cephalexin I initially prescribed for the erythema edema around the left leg due to fears about diarrhea. She did tolerate doxycycline that we subsequently called in 7/26; we are doing 2-week follow-up. Wound measurements are about the same. Still the same fibrinous debris we have been using Hydrofera Blue. Change to Iodoflex today. 8/17; been using Iodoflex. Some improvement in the surface area of the large wound on the right posterior calf left anterior actually slightly larger. 100% [name] covered although it is less adherent 9/seven 3-week follow-up. Been using Iodoflex. We still have continued improvement in the surface area of the large wound on the right calf. The small area on the left anteriorly still is not closed. 01/14/2020 on evaluation today patient appears to be doing decently well in regard to her leg ulcer. Fortunately there is no signs of active infection at this time. With that being said the wound on the right leg laterally is still open and given her trouble. Fortunately there is no signs of active infection at this time. No fevers, chills, nausea, vomiting, or diarrhea. 11/18; I have not seen this woman in quite some time. She told me she had Covid in September. She has chronic venous insufficiency with lymphedema and initially had wounds on her right lateral and left anterior lower legs. The left anterior leg is closed she is wearing a stocking 02/11/2020 upon evaluation today patient appears to be doing well with regard to her leg ulcer. This appears to be somewhat dry which is good although I think the Iodoflex is probably keeping this much too dry at this point. I think it may be time to switch to something different since it has accomplish the goal and we were kind of looking for. Fortunately there is no signs of active infection at this time. 02/25/2020 on evaluation  today patient appears to be  doing well with regard to her wound. She has been tolerating the dressing changes without complication. Fortunately there is no signs of active infection at this time. No fevers, chills, nausea, vomiting, or diarrhea. 03/17/2020 upon evaluation today patient's wound on the right leg seems to be doing quite well. I am very pleased with where things stand today. There does not appear to be any evidence of infection and overall I think she is managing quite nicely at this point. She is pleased. With that being said she does have some swelling of the left leg which is quite significant and again she has not really been checked for a possibility of a blood clot I think we may need to check a venous Doppler study. 03/24/2020 upon evaluation today patient appears to be doing quite well with regard to her wounds currently. Fortunately there is no signs of active infection at this time. No fever or chills noted. With that being said she still has a lot of edema of the left leg the DVT study was negative which is great news. I am very pleased in that regard. With that being said I do believe that the patient likely needs compression stockings in this area and to make sure that she keeps edema to control so she does not end up with issues. As well. Fortunately there is no signs of active infection at this time. 04/07/2020 on evaluation today patient appears to be doing okay in regard to her wound on the right lateral leg although the wrap hospital by the nurse yesterday apparently is not doing as well as we would like to see. It caused some kind of injury outside of exactly where the wound is and this again is new and unfortunate. With that being said there does not appear to be any signs of active infection which is great news. No fever chills noted 04/14/2020 upon evaluation today patient appears to be doing decently well in regard to her wounds. She has been tolerating the dressing changes without complication.  Fortunately there does not appear to be any evidence of active infection at this time. No fevers, chills, nausea, vomiting, or diarrhea. She did well with the wraps staying on for the week and they state up no problems. With that being said she tells me that she just cannot come in every week here to the clinic. For that reason she wants [place] to get the home health nurses to come back out to see her. I can definitely do that but I am not going to continue to allow them to come out to see her if the wrap is not done appropriately. For that reason I do want her to make sure that it is done correctly. If it is not then we can need to just bring her in here weekly to make sure it is until we get this wound healed. Objective Constitutional Well-nourished and well-hydrated in no acute distress. Vitals Time Taken: 3:03 PM, Height: 60 in, Weight: 130 lbs, BMI: 25.4, Temperature: 97.5 F, Pulse: 81 bpm, Respiratory Rate: 16 breaths/min, Blood Pressure: 185/81 mmHg. Respiratory normal breathing without difficulty. Psychiatric this patient is able to make decisions and demonstrates good insight into disease process. Alert and Oriented x 3. pleasant and cooperative. General Notes: Upon inspection patient's wound bed actually showed signs of fairly good granulation there is no signs of active infection at this time and overall I feel like she is actually doing quite well which  is great news. There is no evidence of anything worsening and overall I think that the compression wrap for the week had no ill effects in general I think she did great. Integumentary (Hair, Skin) Wound #6 status is Open. Original cause of wound was Gradually Appeared. The wound is located on the Right Lower Leg. The wound measures 1cm length x 0.2cm width x 0.1cm depth; 0.157cm^2 area and 0.016cm^3 volume. There is Fat Layer (Subcutaneous Tissue) exposed. There is no tunneling or undermining noted. There is a small amount of  serosanguineous drainage noted. The wound margin is distinct with the outline attached to the wound base. There is large (67- 100%) red granulation within the wound bed. There is a small (1-33%) amount of necrotic tissue within the wound bed including Adherent [name]. Wound #9 status is Healed - Epithelialized. Original cause of wound was Gradually Appeared. The wound is located on the Right,Distal,Lateral Lower Leg. The wound measures 0cm length x 0cm width x 0cm depth; 0cm^2 area and 0cm^3 volume. There is no tunneling or undermining noted. There is a none present amount of drainage noted. The wound margin is epibole. There is no granulation within the wound bed. There is no necrotic tissue within the wound bed. Assessment Active Problems ICD-10 Chronic venous hypertension (idiopathic) with ulcer and inflammation of right lower extremity Non-pressure chronic ulcer of other part of right lower leg with fat layer exposed Lymphedema, not elsewhere classified Procedures Wound #6 Pre-procedure diagnosis of Wound #6 is a Lymphedema located on the Right Lower Leg . There was a Three Layer Compression Therapy Procedure by Rhae Hammock, RN. Post procedure Diagnosis Wound #6: Same as Pre-Procedure Plan Follow-up Appointments: Return Appointment in 2 weeks. Bathing/ Shower/ Hygiene: May shower with protection but do not get wound dressing(s) wet. Edema Control - Lymphedema / SCD / Other: Elevate legs to the level of the heart or above for 30 minutes daily and/or when sitting, a frequency of: - throughout the day Avoid standing for long periods of time. Exercise regularly Compression stocking or Garment 20-30 mm/Hg pressure to: - left leg daily. Apply in the morning and remove at night. Home Health: New wound care orders this week; continue Home Health for wound care. May utilize formulary equivalent dressing for wound treatment orders unless otherwise specified. - resume weekly wound care on  Wed. Other Home Health Orders/Instructions: - [facility] WOUND #6: - Lower Leg Wound Laterality: Right Cleanser: Wound Cleanser (Home Health) 1 x Per Week/30 Days Discharge Instructions: Cleanse the wound with wound cleanser prior to applying a clean dressing using gauze sponges, not tissue or cotton balls. Peri-Wound Care: Sween Lotion (Moisturizing lotion) (Home Health) 1 x Per Week/30 Days Discharge Instructions: Apply moisturizing lotion as directed Prim Dressing: KerraCel Ag Gelling Fiber Dressing, 4x5 in (silver alginate) 1 x Per Week/30 Days ary Discharge Instructions: Apply silver alginate to wound bed as instructed Secondary Dressing: Woven Gauze Sponge, Non-Sterile 4x4 in (Home Health) 1 x Per Week/30 Days Discharge Instructions: Apply over primary dressing as directed. Com pression Wrap: ThreePress (3 layer compression wrap) (Home Health) 1 x Per Week/30 Days Discharge Instructions: Apply three layer compression as directed. 1. I would recommend that we going continue with the wound care measures as before and the patient is in agreement with the plan this includes the use of a silver alginate dressing to the wound bed followed by gauze or an ABD pad and then a 3 layer compression wrap applied. 2. I am can recommend that we  continue to monitor for any signs of infection. With that being said in regard to the left leg I do believe that the Velcro compression wrap that she has will work appropriately as far as her swelling is concerned. We will see patient back for reevaluation in 1 week here in the clinic. If anything worsens or changes patient will contact our office for additional recommendations. Electronic Signature(s) Signed: 04/14/2020 3:43:03 PM By: Worthy Keeler PA-C Entered By: Worthy Keeler on 04/14/2020 15:43:03 -------------------------------------------------------------------------------- SuperBill Details Patient Name: Date of Service: Erin [name] [name]  04/14/2020 Medical Record Number: [id number] Patient Account Number: [id number] Date of Birth/Sex: Treating RN: 1930/11/10 (87 y.o. [name], [name] Primary Care Provider: [name] Other Clinician: Referring Provider: Treating Provider/Extender: [name] in Treatment: 52 Diagnosis Coding ICD-10 Codes Code Description [id number] Chronic venous hypertension (idiopathic) with ulcer and inflammation of right lower extremity L97.812 Non-pressure chronic ulcer of other part of right lower leg with fat layer exposed I89.0 Lymphedema, not elsewhere classified Facility Procedures CPT4 Code: [id number] Description: (Facility Use Only) [phone] - Cotton LWR RT LEG Modifier: Quantity: 1 Physician Procedures : CPT4 Code Description Modifier [id number] - WC PHYS LEVEL 3 - EST PT ICD-10 Diagnosis Description I87.331 Chronic venous hypertension (idiopathic) with ulcer and inflammation of right lower extremity L97.812 Non-pressure chronic ulcer of other part  of right lower leg with fat layer exposed I89.0 Lymphedema, not elsewhere classified Quantity: 1 Electronic Signature(s) Signed: 04/14/2020 3:43:15 PM By: Worthy Keeler PA-C Entered By: Worthy Keeler on 04/14/2020 15:43:14

## 2020-04-19 NOTE — Progress Notes (Signed)
Erin [name], Erin [name] ([id number]) Visit Report for 04/14/2020 Arrival Information Details Patient Name: Date of Service: Erin [name] [name] 04/14/2020 2:15 PM Medical Record Number: [id number] Patient Account Number: [id number] Date of Birth/Sex: Treating RN: 10/13/1930 (87 y.o. [name] Primary Care Natori Gudino: [name] Other Clinician: Referring Aziel Morgan: Treating Kayln Garceau/Extender: [name] in Treatment: 59 Visit Information History Since Last Visit Added or deleted any medications: No Patient Arrived: Wheel Chair Any new allergies or adverse reactions: No Arrival Time: 15:01 Had a fall or experienced change in No Accompanied By: self activities of daily living that may affect Transfer Assistance: None risk of falls: Patient Identification Verified: Yes Signs or symptoms of abuse/neglect since last visito No Secondary Verification Process Completed: Yes Hospitalized since last visit: No Patient Requires Transmission-Based Precautions: No Implantable device outside of the clinic excluding No Patient Has Alerts: Yes cellular tissue based products placed in the center Patient Alerts: NON COMPRESSABLE since last visit: Has Dressing in Place as Prescribed: Yes Pain Present Now: No Electronic Signature(s) Signed: 04/19/2020 9:06:40 AM By: Sandre Kitty Entered By: Sandre Kitty on 04/14/2020 15:03:14 -------------------------------------------------------------------------------- Compression Therapy Details Patient Name: Date of Service: Erin [name] [name] 04/14/2020 2:15 PM Medical Record Number: [id number] Patient Account Number: [id number] Date of Birth/Sex: Treating RN: 11/18/1930 (87 y.o. [name] Primary Care Nashonda Limberg: [name] Other Clinician: Referring Jani Moronta: Treating Tasheka Houseman/Extender: [name] in Treatment: 64 Compression Therapy Performed for Wound Assessment: Wound #6  Right Lower Leg Performed By: Clinician Rhae Hammock, RN Compression Type: Three Layer Post Procedure Diagnosis Same as Pre-procedure Electronic Signature(s) Signed: 04/14/2020 5:50:42 PM By: [name] RN, BSN Entered By: [name] on 04/14/2020 15:35:05 -------------------------------------------------------------------------------- Encounter Discharge Information Details Patient Name: Date of Service: Erin [name] [name] 04/14/2020 2:15 PM Medical Record Number: [id number] Patient Account Number: [id number] Date of Birth/Sex: Treating RN: 1931/02/21 (87 y.o. [name], [name] Primary Care Saphyra Hutt: [name] Other Clinician: Referring Stonewall Doss: Treating Aubrii Sharpless/Extender: [name] in Treatment: 43 Encounter Discharge Information Items Discharge Condition: Stable Ambulatory Status: Wheelchair Discharge Destination: Home Transportation: Private Auto Accompanied By: Heriberto Antigua Schedule Follow-up Appointment: Yes Clinical Summary of Care: Patient Declined Electronic Signature(s) Signed: 04/19/2020 9:09:54 AM By: Rhae Hammock RN Entered By: Rhae Hammock on 04/14/2020 17:10:26 -------------------------------------------------------------------------------- Lower Extremity Assessment Details Patient Name: Date of Service: Erin [name] [name] 04/14/2020 2:15 PM Medical Record Number: [id number] Patient Account Number: [id number] Date of Birth/Sex: Treating RN: 09-29-1930 (87 y.o. [name] Primary Care Ryonna Cimini: [name] Other Clinician: Referring Dayshaun Whobrey: Treating Rital Cavey/Extender: [name] in Treatment: 52 Edema Assessment Assessed: [Left: No] [Right: No] Edema: [Left: Yes] [Right: No] Calf Left: Right: Point of Measurement: 35 cm From Medial Instep 28.5 cm Ankle Left: Right: Point of Measurement: 7 cm From Medial Instep 21.5 cm Vascular Assessment Pulses: Dorsalis  Pedis Palpable: [Right:Yes] Electronic Signature(s) Signed: 04/14/2020 5:46:35 PM By: [name] RN, BSN Entered By: [name] on 04/14/2020 15:25:13 -------------------------------------------------------------------------------- Hernando Details Patient Name: Date of Service: Erin [name] [name] 04/14/2020 2:15 PM Medical Record Number: [id number] Patient Account Number: [id number] Date of Birth/Sex: Treating RN: 22-Dec-1930 (87 y.o. [name] Primary Care Yarima Penman: [name] Other Clinician: Referring Swade Shonka: Treating Nic Lampe/Extender: [name] in Treatment: 74 Active Inactive Venous Leg Ulcer Nursing Diagnoses: Actual venous Insuffiency (use after diagnosis is confirmed) Knowledge deficit  related to disease process and management Goals: Patient will maintain optimal edema control Date Initiated: 04/14/2019 Target Resolution Date: 05/12/2020 Goal Status: Active Patient/caregiver will verbalize understanding of disease process and disease management Date Initiated: 04/14/2019 Date Inactivated: 05/19/2019 Target Resolution Date: 05/16/2019 Goal Status: Met Interventions: Assess peripheral edema status every visit. Compression as ordered Provide education on venous insufficiency Notes: Wound/Skin Impairment Nursing Diagnoses: Impaired tissue integrity Knowledge deficit related to ulceration/compromised skin integrity Goals: Patient/caregiver will verbalize understanding of skin care regimen Date Initiated: 04/14/2019 Target Resolution Date: 05/12/2020 Goal Status: Active Ulcer/skin breakdown will have a volume reduction of 30% by week 4 Date Initiated: 04/14/2019 Date Inactivated: 05/19/2019 Target Resolution Date: 05/16/2019 Goal Status: Met Ulcer/skin breakdown will have a volume reduction of 50% by week 8 Date Initiated: 05/19/2019 Date Inactivated: 06/16/2019 Target Resolution Date: 06/20/2019 Goal Status:  Met Interventions: Assess patient/caregiver ability to obtain necessary supplies Assess patient/caregiver ability to perform ulcer/skin care regimen upon admission and as needed Assess ulceration(s) every visit Provide education on ulcer and skin care Notes: Electronic Signature(s) Signed: 04/14/2020 5:50:42 PM By: [name] RN, BSN Entered By: [name] on 04/14/2020 15:34:15 -------------------------------------------------------------------------------- Pain Assessment Details Patient Name: Date of Service: Erin [name] [name] 04/14/2020 2:15 PM Medical Record Number: [id number] Patient Account Number: [id number] Date of Birth/Sex: Treating RN: 07-31-1930 (87 y.o. [name] Primary Care Angelito Hopping: [name] Other Clinician: Referring Coby Antrobus: Treating Donta Mcinroy/Extender: [name] in Treatment: 12 Active Problems Location of Pain Severity and Description of Pain Patient Has Paino No Site Locations Pain Management and Medication Current Pain Management: Electronic Signature(s) Signed: 04/14/2020 5:50:42 PM By: [name] RN, BSN Signed: 04/19/2020 9:06:40 AM By: Sandre Kitty Entered By: Sandre Kitty on 04/14/2020 15:03:42 -------------------------------------------------------------------------------- Patient/Caregiver Education Details Patient Name: Date of Service: Erin [name] [name] 2/2/2022andnbsp2:15 PM Medical Record Number: [id number] Patient Account Number: [id number] Date of Birth/Gender: Treating RN: [birth year] (87 y.o. [name] Primary Care Physician: [name] Other Clinician: Referring Physician: Treating Physician/Extender: [name] in Treatment: 40 Education Assessment Education Provided To: Patient Education Topics Provided Venous: Methods: Explain/Verbal Responses: Reinforcements needed, State content correctly Wound/Skin  Impairment: Methods: Explain/Verbal Responses: Reinforcements needed, State content correctly Electronic Signature(s) Signed: 04/14/2020 5:50:42 PM By: [name] RN, BSN Entered By: [name] on 04/14/2020 15:34:34 -------------------------------------------------------------------------------- Wound Assessment Details Patient Name: Date of Service: Erin [name] [name] 04/14/2020 2:15 PM Medical Record Number: [id number] Patient Account Number: [id number] Date of Birth/Sex: Treating RN: [birth year] (87 y.o. [name], [name] Primary Care Tinsley Everman: [name] Other Clinician: Referring Rosana Farnell: Treating Kayelynn Abdou/Extender: [name] in Treatment: 52 Wound Status Wound Number: 6 Primary Lymphedema Etiology: Wound Location: Right Lower Leg Wound Status: Open Wounding Event: Gradually Appeared Comorbid Cataracts, Glaucoma, Middle ear problems, Anemia, Date Acquired: 03/14/2019 History: Hypertension Weeks Of Treatment: 52 Clustered Wound: No Photos Photo Uploaded By: Mikeal Hawthorne on 04/16/2020 15:18:02 Wound Measurements Length: (cm) 1 Width: (cm) 0.2 Depth: (cm) 0.1 Area: (cm) 0.157 Volume: (cm) 0.016 % Reduction in Area: 99.9% % Reduction in Volume: 100% Epithelialization: Large (67-100%) Tunneling: No Undermining: No Wound Description Classification: Full Thickness Without Exposed Support Structures Wound Margin: Distinct, outline attached Exudate Amount: Small Exudate Type: Serosanguineous Exudate Color: red, brown Foul Odor After Cleansing: No [name]/Fibrino Yes Wound Bed Granulation Amount: Large (67-100%) Exposed Structure Granulation Quality: Red Fascia Exposed: No Necrotic Amount: Small (1-33%) Fat Layer (Subcutaneous Tissue) Exposed: Yes Necrotic  Quality: Adherent [name] Tendon Exposed: No Muscle Exposed: No Joint Exposed: No Bone Exposed: No Treatment Notes Wound #6 (Lower Leg) Wound Laterality:  Right Cleanser Wound Cleanser Discharge Instruction: Cleanse the wound with wound cleanser prior to applying a clean dressing using gauze sponges, not tissue or cotton balls. Peri-Wound Care Sween Lotion (Moisturizing lotion) Discharge Instruction: Apply moisturizing lotion as directed Topical Primary Dressing KerraCel Ag Gelling Fiber Dressing, 4x5 in (silver alginate) Discharge Instruction: Apply silver alginate to wound bed as instructed Secondary Dressing Woven Gauze Sponge, Non-Sterile 4x4 in Discharge Instruction: Apply over primary dressing as directed. Secured With Compression Wrap ThreePress (3 layer compression wrap) Discharge Instruction: Apply three layer compression as directed. Compression Stockings Add-Ons Electronic Signature(s) Signed: 04/14/2020 5:46:35 PM By: [name] RN, BSN Signed: 04/14/2020 5:50:42 PM By: [name] RN, BSN Entered By: [name] on 04/14/2020 15:25:46 -------------------------------------------------------------------------------- Wound Assessment Details Patient Name: Date of Service: Erin [name] [name] 04/14/2020 2:15 PM Medical Record Number: [id number] Patient Account Number: [id number] Date of Birth/Sex: Treating RN: 1930/11/29 (87 y.o. [name], [name] Primary Care Karston Hyland: [name] Other Clinician: Referring Joshawa Dubin: Treating Idali Lafever/Extender: [name] in Treatment: 52 Wound Status Wound Number: 9 Primary Venous Leg Ulcer Etiology: Wound Location: Right, Distal, Lateral Lower Leg Wound Status: Healed - Epithelialized Wounding Event: Gradually Appeared Comorbid Cataracts, Glaucoma, Middle ear problems, Anemia, Date Acquired: 04/07/2020 History: Hypertension Weeks Of Treatment: 1 Clustered Wound: No Photos Photo Uploaded By: Mikeal Hawthorne on 04/16/2020 15:18:03 Wound Measurements Length: (cm) Width: (cm) Depth: (cm) Area: (cm) Volume: (cm) 0 % Reduction in Area:  100% 0 % Reduction in Volume: 100% 0 Epithelialization: Large (67-100%) 0 Tunneling: No 0 Undermining: No Wound Description Classification: Full Thickness Without Exposed Support Structures Wound Margin: Epibole Exudate Amount: None Present Foul Odor After Cleansing: No [name]/Fibrino No Wound Bed Granulation Amount: None Present (0%) Exposed Structure Necrotic Amount: None Present (0%) Fascia Exposed: No Fat Layer (Subcutaneous Tissue) Exposed: No Tendon Exposed: No Muscle Exposed: No Joint Exposed: No Bone Exposed: No Electronic Signature(s) Signed: 04/14/2020 5:46:35 PM By: [name] RN, BSN Signed: 04/14/2020 5:50:42 PM By: [name] RN, BSN Entered By: [name] on 04/14/2020 15:25:32 -------------------------------------------------------------------------------- Conrad Details Patient Name: Date of Service: Erin [name] [name] 04/14/2020 2:15 PM Medical Record Number: [id number] Patient Account Number: [id number] Date of Birth/Sex: Treating RN: [birth year] (87 y.o. [name] Primary Care Sinai Illingworth: [name] Other Clinician: Referring Teodoro Jeffreys: Treating Finch Costanzo/Extender: [name] in Treatment: 29 Vital Signs Time Taken: 15:03 Temperature (F): 97.5 Height (in): 60 Pulse (bpm): 81 Weight (lbs): 130 Respiratory Rate (breaths/min): 16 Body Mass Index (BMI): 25.4 Blood Pressure (mmHg): 185/81 Reference Range: 80 - 120 mg / dl Electronic Signature(s) Signed: 04/19/2020 9:06:40 AM By: Sandre Kitty Entered By: Sandre Kitty on 04/14/2020 15:03:35

## 2020-04-21 ENCOUNTER — Encounter (HOSPITAL_BASED_OUTPATIENT_CLINIC_OR_DEPARTMENT_OTHER): Payer: Medicare HMO | Admitting: Physician Assistant

## 2020-04-28 ENCOUNTER — Encounter (HOSPITAL_BASED_OUTPATIENT_CLINIC_OR_DEPARTMENT_OTHER): Payer: Medicare HMO | Admitting: Physician Assistant

## 2020-05-05 ENCOUNTER — Other Ambulatory Visit: Payer: Self-pay

## 2020-05-05 ENCOUNTER — Encounter (HOSPITAL_BASED_OUTPATIENT_CLINIC_OR_DEPARTMENT_OTHER): Payer: Medicare HMO | Admitting: Physician Assistant

## 2020-05-05 DIAGNOSIS — I87331 Chronic venous hypertension (idiopathic) with ulcer and inflammation of right lower extremity: Secondary | ICD-10-CM | POA: Diagnosis not present

## 2020-05-05 NOTE — Progress Notes (Signed)
KRISTEENA, MEINEKE ([id number]) Visit Report for 05/05/2020 Arrival Information Details Patient Name: Date of Service: Lindaann Slough [name] 05/05/2020 1:00 PM Medical Record Number: [id number] Patient Account Number: [id number] Date of Birth/Sex: Treating RN: 14-Jan-1931 (87 y.o. [name], Tammi Klippel Primary Care Trevante Tennell: [name] Other Clinician: Referring Evelyne Makepeace: Treating Milam Allbaugh/Extender: [name] in Treatment: 3 Visit Information History Since Last Visit Added or deleted any medications: No Patient Arrived: Wheel Chair Any new allergies or adverse reactions: No Arrival Time: 13:12 Had a fall or experienced change in No Accompanied By: caregiver activities of daily living that may affect Transfer Assistance: None risk of falls: Patient Identification Verified: Yes Signs or symptoms of abuse/neglect since last visito No Secondary Verification Process Completed: Yes Hospitalized since last visit: No Patient Requires Transmission-Based Precautions: No Implantable device outside of the clinic excluding No Patient Has Alerts: Yes cellular tissue based products placed in the center Patient Alerts: NON COMPRESSABLE since last visit: Has Dressing in Place as Prescribed: Yes Has Compression in Place as Prescribed: Yes Pain Present Now: No Electronic Signature(s) Signed: 05/05/2020 5:17:36 PM By: Deon Pilling Entered By: Deon Pilling on 05/05/2020 13:13:08 -------------------------------------------------------------------------------- Clinic Level of Care Assessment Details Patient Name: Date of Service: Lindaann Slough [name] 05/05/2020 1:00 PM Medical Record Number: [id number] Patient Account Number: [id number] Date of Birth/Sex: Treating RN: Mar 08, 1931 (87 y.o. [name], [name] Primary Care Traquan Duarte: [name] Other Clinician: Referring Abrahim Sargent: Treating Danaija Eskridge/Extender: [name] in Treatment:  55 Clinic Level of Care Assessment Items TOOL 4 Quantity Score []  - 0 Use when only an EandM is performed on FOLLOW-UP visit ASSESSMENTS - Nursing Assessment / Reassessment X- 1 10 Reassessment of Co-morbidities (includes updates in patient status) X- 1 5 Reassessment of Adherence to Treatment Plan ASSESSMENTS - Wound and Skin A ssessment / Reassessment X - Simple Wound Assessment / Reassessment - one wound 1 5 []  - 0 Complex Wound Assessment / Reassessment - multiple wounds []  - 0 Dermatologic / Skin Assessment (not related to wound area) ASSESSMENTS - Focused Assessment X- 1 5 Circumferential Edema Measurements - multi extremities []  - 0 Nutritional Assessment / Counseling / Intervention X- 1 5 Lower Extremity Assessment (monofilament, tuning fork, pulses) []  - 0 Peripheral Arterial Disease Assessment (using hand held doppler) ASSESSMENTS - Ostomy and/or Continence Assessment and Care []  - 0 Incontinence Assessment and Management []  - 0 Ostomy Care Assessment and Management (repouching, etc.) PROCESS - Coordination of Care X - Simple Patient / Family Education for ongoing care 1 15 []  - 0 Complex (extensive) Patient / Family Education for ongoing care X- 1 10 Staff obtains Programmer, systems, Records, T Results / Process Orders est []  - 0 Staff telephones HHA, Nursing Homes / Clarify orders / etc []  - 0 Routine Transfer to another Facility (non-emergent condition) []  - 0 Routine Hospital Admission (non-emergent condition) []  - 0 New Admissions / Biomedical engineer / Ordering NPWT Apligraf, etc. , []  - 0 Emergency Hospital Admission (emergent condition) X- 1 10 Simple Discharge Coordination []  - 0 Complex (extensive) Discharge Coordination PROCESS - Special Needs []  - 0 Pediatric / Minor Patient Management []  - 0 Isolation Patient Management []  - 0 Hearing / Language / Visual special needs []  - 0 Assessment of Community assistance (transportation, D/C  planning, etc.) []  - 0 Additional assistance / Altered mentation []  - 0 Support Surface(s) Assessment (bed, cushion, seat, etc.) INTERVENTIONS - Wound Cleansing / Measurement X - Simple  Wound Cleansing - one wound 1 5 []  - 0 Complex Wound Cleansing - multiple wounds X- 1 5 Wound Imaging (photographs - any number of wounds) []  - 0 Wound Tracing (instead of photographs) []  - 0 Simple Wound Measurement - one wound []  - 0 Complex Wound Measurement - multiple wounds INTERVENTIONS - Wound Dressings []  - 0 Small Wound Dressing one or multiple wounds []  - 0 Medium Wound Dressing one or multiple wounds []  - 0 Large Wound Dressing one or multiple wounds []  - 0 Application of Medications - topical []  - 0 Application of Medications - injection INTERVENTIONS - Miscellaneous []  - 0 External ear exam []  - 0 Specimen Collection (cultures, biopsies, blood, body fluids, etc.) []  - 0 Specimen(s) / Culture(s) sent or taken to Lab for analysis []  - 0 Patient Transfer (multiple staff / Civil Service fast streamer / Similar devices) []  - 0 Simple Staple / Suture removal (25 or less) []  - 0 Complex Staple / Suture removal (26 or more) []  - 0 Hypo / Hyperglycemic Management (close monitor of Blood Glucose) []  - 0 Ankle / Brachial Index (ABI) - do not check if billed separately X- 1 5 Vital Signs Has the patient been seen at the hospital within the last three years: Yes Total Score: 80 Level Of Care: New/Established - Level 3 Electronic Signature(s) Signed: 05/05/2020 5:21:35 PM By: [name] RN, BSN Entered By: [name] on 05/05/2020 13:56:45 -------------------------------------------------------------------------------- Encounter Discharge Information Details Patient Name: Date of Service: Lindaann Slough [name] 05/05/2020 1:00 PM Medical Record Number: [id number] Patient Account Number: [id number] Date of Birth/Sex: Treating RN: 1930/06/05 (87 y.o. [name], [name] Primary Care  Marnae Madani: [name] Other Clinician: Referring Monike Bragdon: Treating Oneika Simonian/Extender: [name] in Treatment: 48 Encounter Discharge Information Items Discharge Condition: Stable Ambulatory Status: Wheelchair Discharge Destination: Home Transportation: Private Auto Accompanied By: family Schedule Follow-up Appointment: Yes Clinical Summary of Care: Patient Declined Electronic Signature(s) Signed: 05/05/2020 5:04:01 PM By: Rhae Hammock RN Entered By: Rhae Hammock on 05/05/2020 14:22:13 -------------------------------------------------------------------------------- Lower Extremity Assessment Details Patient Name: Date of Service: Lindaann Slough [name] 05/05/2020 1:00 PM Medical Record Number: [id number] Patient Account Number: [id number] Date of Birth/Sex: Treating RN: 22-Feb-1931 (87 y.o. [name], Tammi Klippel Primary Care Pearl Bents: [name] Other Clinician: Referring Breck Hollinger: Treating Kemesha Mosey/Extender: [name] in Treatment: 55 Edema Assessment Assessed: [Left: No] [Right: Yes] Edema: [Left: Yes] [Right: No] Calf Left: Right: Point of Measurement: 35 cm From Medial Instep 38 cm Ankle Left: Right: Point of Measurement: 7 cm From Medial Instep 21 cm Vascular Assessment Pulses: Dorsalis Pedis Palpable: [Right:Yes] Electronic Signature(s) Signed: 05/05/2020 5:17:36 PM By: Deon Pilling Entered By: Deon Pilling on 05/05/2020 13:13:50 -------------------------------------------------------------------------------- Multi-Disciplinary Care Plan Details Patient Name: Date of Service: Lindaann Slough [name] 05/05/2020 1:00 PM Medical Record Number: [id number] Patient Account Number: [id number] Date of Birth/Sex: Treating RN: 10/25/1930 (87 y.o. [name] Primary Care Deisy Ozbun: [name] Other Clinician: Referring Ashan Cueva: Treating Jabree Rebert/Extender: [name] in  Treatment: [place] reviewed with physician Active Inactive Electronic Signature(s) Signed: 05/05/2020 5:21:35 PM By: [name] RN, BSN Entered By: [name] on 05/05/2020 13:55:41 -------------------------------------------------------------------------------- Pain Assessment Details Patient Name: Date of Service: Lindaann Slough [name] 05/05/2020 1:00 PM Medical Record Number: [id number] Patient Account Number: [id number] Date of Birth/Sex: Treating RN: [birth year] (87 y.o. [name] Primary Care Whitney Bingaman: [name] Other Clinician: Referring Leotta Weingarten:  Treating Jerimey Burridge/Extender: [name] in Treatment: 55 Active Problems Location of Pain Severity and Description of Pain Patient Has Paino No Site Locations Rate the pain. Rate the pain. Current Pain Level: 0 Pain Management and Medication Current Pain Management: Medication: No Cold Application: No Rest: No Massage: No Activity: No T.E.N.S.: No Heat Application: No Leg drop or elevation: No Is the Current Pain Management Adequate: Adequate How does your wound impact your activities of daily livingo Sleep: No Bathing: No Appetite: No Relationship With Others: No Bladder Continence: No Emotions: No Bowel Continence: No Work: No Toileting: No Drive: No Dressing: No Hobbies: No Electronic Signature(s) Signed: 05/05/2020 5:17:36 PM By: Deon Pilling Entered By: Deon Pilling on 05/05/2020 13:13:37 -------------------------------------------------------------------------------- Patient/Caregiver Education Details Patient Name: Date of Service: Lindaann Slough [name] 2/23/2022andnbsp1:00 PM Medical Record Number: [id number] Patient Account Number: [id number] Date of Birth/Gender: Treating RN: 1930/05/15 (87 y.o. [name] Primary Care Physician: [name] Other Clinician: Referring Physician: Treating Physician/Extender: [name] in Treatment: 70 Education Assessment Education Provided To: Patient Education Topics Provided Venous: Methods: Explain/Verbal Responses: Reinforcements needed, State content correctly Wound/Skin Impairment: Methods: Explain/Verbal Responses: Reinforcements needed, State content correctly Electronic Signature(s) Signed: 05/05/2020 5:21:35 PM By: [name] RN, BSN Signed: 05/05/2020 5:21:35 PM By: [name] RN, BSN Entered By: [name] on 05/05/2020 13:56:03 -------------------------------------------------------------------------------- Wound Assessment Details Patient Name: Date of Service: Lindaann Slough [name] 05/05/2020 1:00 PM Medical Record Number: [id number] Patient Account Number: [id number] Date of Birth/Sex: Treating RN: 1930/11/14 (87 y.o. [name], Tammi Klippel Primary Care Drevon Plog: [name] Other Clinician: Referring Diangelo Radel: Treating Lukisha Procida/Extender: [name] in Treatment: 55 Wound Status Wound Number: 6 Primary Etiology: Lymphedema Wound Location: Right Lower Leg Wound Status: Open Wounding Event: Gradually Appeared Date Acquired: 03/14/2019 Weeks Of Treatment: 55 Clustered Wound: No Wound Measurements Length: (cm) Width: (cm) Depth: (cm) Area: (cm) Volume: (cm) 0 % Reduction in Area: 100% 0 % Reduction in Volume: 100% 0 0 0 Wound Description Classification: Full Thickness Without Exposed Support Structur es Electronic Signature(s) Signed: 05/05/2020 5:17:36 PM By: Deon Pilling Entered By: Deon Pilling on 05/05/2020 13:13:58 -------------------------------------------------------------------------------- Vitals Details Patient Name: Date of Service: [name], CHA [name] 05/05/2020 1:00 PM Medical Record Number: [id number] Patient Account Number: [id number] Date of Birth/Sex: Treating RN: 01/11/1931 (87 y.o. [name], Tammi Klippel Primary Care Delshon Blanchfield: [name] Other Clinician: Referring Chandani Rogowski: Treating Jhovany Weidinger/Extender: [name] in Treatment: 2 Vital Signs Time Taken: 13:10 Temperature (F): 97.6 Height (in): 60 Pulse (bpm): 88 Weight (lbs): 130 Respiratory Rate (breaths/min): 20 Body Mass Index (BMI): 25.4 Blood Pressure (mmHg): 138/72 Reference Range: 80 - 120 mg / dl Electronic Signature(s) Signed: 05/05/2020 5:17:36 PM By: Deon Pilling Entered By: Deon Pilling on 05/05/2020 13:13:24

## 2020-05-05 NOTE — Progress Notes (Addendum)
Erin [name] ([id number]) Visit Report for 05/05/2020 Chief Complaint Document Details Patient Name: Date of Service: Erin [name] [name] 05/05/2020 1:00 PM Medical Record Number: [id number] Patient Account Number: [id number] Date of Birth/Sex: Treating RN: 04/30/1930 (87 y.o. Erin [name] Primary Care Provider: [name] Other Clinician: Referring Provider: Treating Provider/Extender: [name] in Treatment: 63 Information Obtained from: Patient Chief Complaint 03/22/17; patient is here for review of wounds on her bilateral legs Electronic Signature(s) Signed: 05/05/2020 1:29:34 PM By: Worthy Keeler PA-C Entered By: Worthy Keeler on 05/05/2020 13:29:34 -------------------------------------------------------------------------------- HPI Details Patient Name: Date of Service: Erin [name] [name] 05/05/2020 1:00 PM Medical Record Number: [id number] Patient Account Number: [id number] Date of Birth/Sex: Treating RN: 04-18-1930 (87 y.o. Erin [name] Primary Care Provider: [name] Other Clinician: Referring Provider: Treating Provider/Extender: [name] in Treatment: 61 History of Present Illness HPI Description: We to find a new wound on the left 03/22/17; this is an 87 year old woman. There is not a lot of information in care everywhere on this patient. She had a history of a malignant neoplasm her left breast for which she had lumpectomy but she did not have radiation. Apparently she has had long- standing edema and her lower legs and she was seen in the wound care center at [facility] in [place] by [name] October. Both her legs were wrapped however it sounds as though the left leg became secondarily infected she apparently had MRSA and she was admitted the hospital. Not really turn for wound care. She lives at home on her own and has apparently [facility] home health care. We are not  exactly clear what [facility] is doing to her legs but it does not involve compression. She is not a diabetic. ABIs in our clinic were noncompressible bilaterally 03/30/17; this is a patient who has bilateral chronic venous insufficiency, severe venous inflammation. We had a nice description number legs from a note by [name] dating 2010 describing edematous warm legs with erythema. This suggests that she has chronic venous insufficiency with inflammation.. We have arterial studies dated 12/29/16 again from [facility]. This showed a right ABI at 1 and a TBI of 0.81 on the left the ABI was 1.25 and a TBI of 0.72. This was just she does not have significant arterial insufficiency. She had normal triphasic waveforms noted that she was admitted to hospital in late October with MSSA cellulitis Last visit we wrapped both her legs. She had an open area predominantly on the left anterior but weeping edema on the right anterior leg. She has done well there is only one at anterior left tibial wound 04/06/17; the patient has no open area on her right leg. Her remaining wound is on the left anterior leg, the left posterior leg wound is healed. We have been using silver alginate changed to [facility] on the left leg today she has home health 04/13/17; still no open area on her right leg. Her remaining wound on the left anterior leg. We have been using using Hydrofera Blue 04/20/17; no open area on the right leg although the edema here is somewhat disfiguring. Her remaining wound is on the left anterior leg we've been using Hydrofera Blue with improvement in dimensions. She has [facility] home health and lives in [place]. She requests to come every 2 weeks because of the distance involved in coming here 05/04/17; there is no open area  on the right leg. She has a very small left anterior leg wound that remains. However what remains on the left still has some depth and a not to viable looking  surface. It is too small to really attempt debridement. She has home health coming out to her home in [facility] 05/18/17; there is no open area on either leg. She has significant chronic venous insufficiency. The left anterior leg wound that was still open 2 weeks ago has closed. She has home health coming out. We are going to order Juxtalite stockings for both legs. She is agreed to pay for these privately. We will order them from [facility] READMISSION 04/14/2019 This is a patient we previously had in this clinic in 2019 for 3 months. She had a wound on the left anterior greater than right anterior leg. According to my notes we discharged her and juxta lite stockings although it does not appear that she ever got them. The history here is a bit difficult. She apparently has had a wound on the left lower leg for the last 6 months. She required admission to hospital in [facility] for apparent cellulitis just before Christmas was discharged home she has an open wound. She has a home health agency although we are not exactly sure which one. They are applying silver alginate. No compression Past medical history is reviewed she has chronic venous insufficiency and a history of left breast CA. 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 She did not have any compression on the wounds today. 2/8; substantial wound on the right posterior calf and a smaller area on the left medial calf. We are using Iodoflex to both wound areas under compression. We have better edema control We to find a new wound on the left anterior medial calf 2/15; the area on the left medial calf is closed over. Substantial area on the right posterior and lateral calf. We have been using Iodoflex although the patient complains of pain and wonders if there is not an alternative. She has home health changing the dressing twice a  week and she sees [place] once 2/22; the wound on the left medial calf is closed over. Her edema control is not good here but she cannot get on stockings. This substantial wound is on the right lateral and right posterior calf. Changed her to a hydrofera Blue last week. Gradual improvement in wound area. 3/8; wound on the left leg remains closed. The substantial wound is on the right lateral and right posterior calf. This is measuring smaller 3/22; 2-week follow-up. Substantial area on the right posterior and lateral calf measures slightly smaller. We are using Hydrofera Blue under compression 4/5; 2-week follow-up. Substantial area on the right posterior lateral calf. This measures slightly smaller. She has islands of epithelialization we have been using Hydrofera Blue under compression 4/19; 2-week follow-up. We have been using Hydrofera Blue however there is maceration around the wound. I will change to silver alginate today. She has islands of epithelialization however I did not see much evidence that things were improving here. 5/6; 2-week follow-up. I put silver alginate on this because of maceration around the wound last time. However she comes in today with a lot of very adherent debris on the wound of the right lateral leg. 5/17; 2-week follow-up. Using Hydrofera Blue under compression. The patient has epithelialization including islands of  epithelialization in the middle of fairly substantial wound area on the right posterior lateral calf 6/1; 2-week follow-up. Using Hydrofera Blue under compression. She seems to have increasing epithelialization. This is an irregular wound going from medial to lateral posteriorly in the right posterior calf. This would not be easy wound to get reproducible measurement 08/25/2019 upon evaluation today patient appears to actually be doing better in regard to the wound on her leg at this point. We have been using Adaptic followed by [facility] this is keeping  her from getting stuck. The patient is aware that this is also potentially slowing down her healing simply due to the fact that the [facility] not in direct contact with the wound bed. Nonetheless she was not happy with the way it was sticking previous which is why we switched to this according to what I can read in the note and hear from the patient and her aide with her today. 6/28; 2-week follow-up. We are following this patient for severe wound on the right lower leg secondary to chronic venous insufficiency using Hydrofera Blue under compression we are making gradual improvements. She arrives in clinic today with what looks like a contusion on the left anterior mid tibia. She says she was washing her leg and pulled some skin. However there is a fairly large wound with surrounding erythema and some swelling 7/12; 2-week follow-up. Right lower leg wound not quite as good as I thought. There is still islands of epithelialization we are using Hydrofera Blue with a contact layer I would like to see if we can get rid of the contact layer at this point. The new area from last time is a lot smaller She did not take the cephalexin I initially prescribed for the erythema edema around the left leg due to fears about diarrhea. She did tolerate doxycycline that we subsequently called in 7/26; we are doing 2-week follow-up. Wound measurements are about the same. Still the same fibrinous debris we have been using Hydrofera Blue. Change to Iodoflex today. 8/17; been using Iodoflex. Some improvement in the surface area of the large wound on the right posterior calf left anterior actually slightly larger. 100% [name] covered although it is less adherent 9/seven 3-week follow-up. Been using Iodoflex. We still have continued improvement in the surface area of the large wound on the right calf. The small area on the left anteriorly still is not closed. 01/14/2020 on evaluation today patient appears to be doing  decently well in regard to her leg ulcer. Fortunately there is no signs of active infection at this time. With that being said the wound on the right leg laterally is still open and given her trouble. Fortunately there is no signs of active infection at this time. No fevers, chills, nausea, vomiting, or diarrhea. 11/18; I have not seen this woman in quite some time. She told me she had Covid in September. She has chronic venous insufficiency with lymphedema and initially had wounds on her right lateral and left anterior lower legs. The left anterior leg is closed she is wearing a stocking 02/11/2020 upon evaluation today patient appears to be doing well with regard to her leg ulcer. This appears to be somewhat dry which is good although I think the Iodoflex is probably keeping this much too dry at this point. I think it may be time to switch to something different since it has accomplish the goal and we were kind of looking for. Fortunately there is no signs of  active infection at this time. 02/25/2020 on evaluation today patient appears to be doing well with regard to her wound. She has been tolerating the dressing changes without complication. Fortunately there is no signs of active infection at this time. No fevers, chills, nausea, vomiting, or diarrhea. 03/17/2020 upon evaluation today patient's wound on the right leg seems to be doing quite well. I am very pleased with where things stand today. There does not appear to be any evidence of infection and overall I think she is managing quite nicely at this point. She is pleased. With that being said she does have some swelling of the left leg which is quite significant and again she has not really been checked for a possibility of a blood clot I think we may need to check a venous Doppler study. 03/24/2020 upon evaluation today patient appears to be doing quite well with regard to her wounds currently. Fortunately there is no signs of active infection  at this time. No fever or chills noted. With that being said she still has a lot of edema of the left leg the DVT study was negative which is great news. I am very pleased in that regard. With that being said I do believe that the patient likely needs compression stockings in this area and to make sure that she keeps edema to control so she does not end up with issues. As well. Fortunately there is no signs of active infection at this time. 04/07/2020 on evaluation today patient appears to be doing okay in regard to her wound on the right lateral leg although the wrap hospital by the nurse yesterday apparently is not doing as well as we would like to see. It caused some kind of injury outside of exactly where the wound is and this again is new and unfortunate. With that being said there does not appear to be any signs of active infection which is great news. No fever chills noted 04/14/2020 upon evaluation today patient appears to be doing decently well in regard to her wounds. She has been tolerating the dressing changes without complication. Fortunately there does not appear to be any evidence of active infection at this time. No fevers, chills, nausea, vomiting, or diarrhea. She did well with the wraps staying on for the week and they state up no problems. With that being said she tells me that she just cannot come in every week here to the clinic. For that reason she wants [place] to get the home health nurses to come back out to see her. I can definitely do that but I am not going to continue to allow them to come out to see her if the wrap is not done appropriately. For that reason I do want her to make sure that it is done correctly. If it is not then we can need to just bring her in here weekly to make sure it is until we get this wound healed. 05/05/2020 upon evaluation today patient actually appears to be completely healed which is great news. Overall I am extremely pleased with where things  stand today. I see no signs of active infection at this point. Electronic Signature(s) Signed: 05/05/2020 2:00:55 PM By: Worthy Keeler PA-C Entered By: Worthy Keeler on 05/05/2020 14:00:55 -------------------------------------------------------------------------------- Physical Exam Details Patient Name: Date of Service: Erin [name] [name] 05/05/2020 1:00 PM Medical Record Number: [id number] Patient Account Number: [id number] Date of Birth/Sex: Treating RN: 11-May-1930 (87 y.o. F) [name] Primary  Care Provider: [name] Other Clinician: Referring Provider: Treating Provider/Extender: [name] in Treatment: 25 Constitutional Well-nourished and well-hydrated in no acute distress. Respiratory normal breathing without difficulty. Psychiatric this patient is able to make decisions and demonstrates good insight into disease process. Alert and Oriented x 3. pleasant and cooperative. Notes Upon inspection patient's wound bed actually showed signs of good epithelization at this time. There does not appear to be any evidence of active infection which is great news and overall very pleased with the fact that she is completely healed. The patient is very happy to hear this. Electronic Signature(s) Signed: 05/05/2020 2:01:17 PM By: Worthy Keeler PA-C Entered By: Worthy Keeler on 05/05/2020 14:01:17 -------------------------------------------------------------------------------- Physician Orders Details Patient Name: Date of Service: Erin [name] [name] 05/05/2020 1:00 PM Medical Record Number: [id number] Patient Account Number: [id number] Date of Birth/Sex: Treating RN: 03-08-1931 (87 y.o. Erin [name] Primary Care Provider: [name] Other Clinician: Referring Provider: Treating Provider/Extender: [name] in Treatment: 12 Verbal / Phone Orders: No Diagnosis Coding ICD-10 Coding Code  Description I87.331 Chronic venous hypertension (idiopathic) with ulcer and inflammation of right lower extremity L97.812 Non-pressure chronic ulcer of other part of right lower leg with fat layer exposed I89.0 Lymphedema, not elsewhere classified Discharge From [place] Discharge from [place] Bathing/ Shower/ Hygiene May shower and wash wound with soap and water. Edema Control - Lymphedema / SCD / Other Bilateral Lower Extremities Elevate legs to the level of the heart or above for 30 minutes daily and/or when sitting, a frequency of: - throughout the day Avoid standing for long periods of time. Exercise regularly Compression stocking or Garment 20-30 mm/Hg pressure to: - both legs daily. Apply in the morning and remove at night. okay to change 3 times per week [facility] home health for wound care. Other Home Health Orders/Instructions: - [employer] account planner) Signed: 05/05/2020 5:21:35 PM By: [name] RN, BSN Signed: 05/05/2020 6:06:11 PM By: Worthy Keeler PA-C Entered By: [name] on 05/05/2020 13:59:14 -------------------------------------------------------------------------------- Problem List Details Patient Name: Date of Service: Erin [name] [name] 05/05/2020 1:00 PM Medical Record Number: [id number] Patient Account Number: [id number] Date of Birth/Sex: Treating RN: 1931/01/16 (87 y.o. Erin [name] Primary Care Provider: [name] Other Clinician: Referring Provider: Treating Provider/Extender: [name] in Treatment: 17 Active Problems ICD-10 Encounter Code Description Active Date MDM Diagnosis I87.331 Chronic venous hypertension (idiopathic) with ulcer and inflammation of right 04/14/2019 No Yes lower extremity L97.812 Non-pressure chronic ulcer of other part of right lower leg with fat layer 04/14/2019 No Yes exposed I89.0 Lymphedema, not elsewhere classified 04/14/2019 No  Yes Inactive Problems ICD-10 Code Description Active Date Inactive Date L97.822 Non-pressure chronic ulcer of other part of left lower leg with fat layer exposed 09/08/2019 09/08/2019 L97.821 Non-pressure chronic ulcer of other part of left lower leg limited to breakdown of skin 04/21/2019 04/21/2019 L03.116 Cellulitis of left lower limb 09/08/2019 09/08/2019 Resolved Problems Electronic Signature(s) Signed: 05/05/2020 1:29:29 PM By: Worthy Keeler PA-C Entered By: Worthy Keeler on 05/05/2020 13:29:29 -------------------------------------------------------------------------------- Progress Note Details Patient Name: Date of Service: Erin [name] [name] 05/05/2020 1:00 PM Medical Record Number: [id number] Patient Account Number: [id number] Date of Birth/Sex: Treating RN: [birth year] (87 y.o. Erin [name] Primary Care Provider: [name] Other Clinician: Referring Provider: Treating Provider/Extender: [name] in Treatment: 55 Subjective  Chief Complaint Information obtained from Patient 03/22/17; patient is here for review of wounds on her bilateral legs History of Present Illness (HPI) We to find a new wound on the left 03/22/17; this is an 87 year old woman. There is not a lot of information in care everywhere on this patient. She had a history of a malignant neoplasm her left breast for which she had lumpectomy but she did not have radiation. Apparently she has had long-standing edema and her lower legs and she was seen in the wound care center at [facility] in [place] by [name] October. Both her legs were wrapped however it sounds as though the left leg became secondarily infected she apparently had MRSA and she was admitted the hospital. Not really turn for wound care. She lives at home on her own and has apparently [facility] home health care. We are not exactly clear what [facility] is doing to her legs but it does not involve compression. She  is not a diabetic. ABIs in our clinic were noncompressible bilaterally 03/30/17; this is a patient who has bilateral chronic venous insufficiency, severe venous inflammation. We had a nice description number legs from a note by [name] dating 2010 describing edematous warm legs with erythema. This suggests that she has chronic venous insufficiency with inflammation.. We have arterial studies dated 12/29/16 again from [facility]. This showed a right ABI at 1 and a TBI of 0.81 on the left the ABI was 1.25 and a TBI of 0.72. This was just she does not have significant arterial insufficiency. She had normal triphasic waveforms noted that she was admitted to hospital in late October with MSSA cellulitis Last visit we wrapped both her legs. She had an open area predominantly on the left anterior but weeping edema on the right anterior leg. She has done well there is only one at anterior left tibial wound 04/06/17; the patient has no open area on her right leg. Her remaining wound is on the left anterior leg, the left posterior leg wound is healed. We have been using silver alginate changed to [facility] on the left leg today she has home health 04/13/17; still no open area on her right leg. Her remaining wound on the left anterior leg. We have been using using Hydrofera Blue 04/20/17; no open area on the right leg although the edema here is somewhat disfiguring. Her remaining wound is on the left anterior leg we've been using Hydrofera Blue with improvement in dimensions. She has [facility] home health and lives in [place]. She requests to come every 2 weeks because of the distance involved in coming here 05/04/17; there is no open area on the right leg. She has a very small left anterior leg wound that remains. However what remains on the left still has some depth and a not to viable looking surface. It is too small to really attempt debridement. She has home health coming out to her  home in [facility] 05/18/17; there is no open area on either leg. She has significant chronic venous insufficiency. The left anterior leg wound that was still open 2 weeks ago has closed. She has home health coming out. We are going to order Juxtalite stockings for both legs. She is agreed to pay for these privately. We will order them from [facility] READMISSION 04/14/2019 This is a patient we previously had in this clinic in 2019 for 3 months. She had a wound on the left anterior greater than right anterior leg. According  to my notes we discharged her and juxta lite stockings although it does not appear that she ever got them. The history here is a bit difficult. She apparently has had a wound on the left lower leg for the last 6 months. She required admission to hospital in [facility] for apparent cellulitis just before Christmas was discharged home she has an open wound. She has a home health agency although we are not exactly sure which one. They are applying silver alginate. No compression Past medical history is reviewed she has chronic venous insufficiency and a history of left breast CA. 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 She did not have any compression on the wounds today. 2/8; substantial wound on the right posterior calf and a smaller area on the left medial calf. We are using Iodoflex to both wound areas under compression. We have better edema control We to find a new wound on the left anterior medial calf 2/15; the area on the left medial calf is closed over. Substantial area on the right posterior and lateral calf. We have been using Iodoflex although the patient complains of pain and wonders if there is not an alternative. She has home health changing the dressing twice a week and she sees [place] once 2/22; the wound on the left medial calf is closed over. Her edema  control is not good here but she cannot get on stockings. This substantial wound is on the right lateral and right posterior calf. Changed her to a hydrofera Blue last week. Gradual improvement in wound area. 3/8; wound on the left leg remains closed. The substantial wound is on the right lateral and right posterior calf. This is measuring smaller 3/22; 2-week follow-up. Substantial area on the right posterior and lateral calf measures slightly smaller. We are using Hydrofera Blue under compression 4/5; 2-week follow-up. Substantial area on the right posterior lateral calf. This measures slightly smaller. She has islands of epithelialization we have been using Hydrofera Blue under compression 4/19; 2-week follow-up. We have been using Hydrofera Blue however there is maceration around the wound. I will change to silver alginate today. She has islands of epithelialization however I did not see much evidence that things were improving here. 5/6; 2-week follow-up. I put silver alginate on this because of maceration around the wound last time. However she comes in today with a lot of very adherent debris on the wound of the right lateral leg. 5/17; 2-week follow-up. Using Hydrofera Blue under compression. The patient has epithelialization including islands of epithelialization in the middle of fairly substantial wound area on the right posterior lateral calf 6/1; 2-week follow-up. Using Hydrofera Blue under compression. She seems to have increasing epithelialization. This is an irregular wound going from medial to lateral posteriorly in the right posterior calf. This would not be easy wound to get reproducible measurement 08/25/2019 upon evaluation today patient appears to actually be doing better in regard to the wound on her leg at this point. We have been using Adaptic followed by [facility] this is keeping her from getting stuck. The patient is aware that this is also potentially slowing down her  healing simply due to the fact that the [facility] not in direct contact with the wound bed. Nonetheless she was not happy with the way it was sticking previous which is why we switched to  this according to what I can read in the note and hear from the patient and her aide with her today. 6/28; 2-week follow-up. We are following this patient for severe wound on the right lower leg secondary to chronic venous insufficiency using Hydrofera Blue under compression we are making gradual improvements. She arrives in clinic today with what looks like a contusion on the left anterior mid tibia. She says she was washing her leg and pulled some skin. However there is a fairly large wound with surrounding erythema and some swelling 7/12; 2-week follow-up. Right lower leg wound not quite as good as I thought. There is still islands of epithelialization we are using Hydrofera Blue with a contact layer I would like to see if we can get rid of the contact layer at this point. ooThe new area from last time is a lot smaller ooShe did not take the cephalexin I initially prescribed for the erythema edema around the left leg due to fears about diarrhea. She did tolerate doxycycline that we subsequently called in 7/26; we are doing 2-week follow-up. Wound measurements are about the same. Still the same fibrinous debris we have been using Hydrofera Blue. Change to Iodoflex today. 8/17; been using Iodoflex. Some improvement in the surface area of the large wound on the right posterior calf left anterior actually slightly larger. 100% [name] covered although it is less adherent 9/seven 3-week follow-up. Been using Iodoflex. We still have continued improvement in the surface area of the large wound on the right calf. The small area on the left anteriorly still is not closed. 01/14/2020 on evaluation today patient appears to be doing decently well in regard to her leg ulcer. Fortunately there is no signs of active  infection at this time. With that being said the wound on the right leg laterally is still open and given her trouble. Fortunately there is no signs of active infection at this time. No fevers, chills, nausea, vomiting, or diarrhea. 11/18; I have not seen this woman in quite some time. She told me she had Covid in September. She has chronic venous insufficiency with lymphedema and initially had wounds on her right lateral and left anterior lower legs. The left anterior leg is closed she is wearing a stocking 02/11/2020 upon evaluation today patient appears to be doing well with regard to her leg ulcer. This appears to be somewhat dry which is good although I think the Iodoflex is probably keeping this much too dry at this point. I think it may be time to switch to something different since it has accomplish the goal and we were kind of looking for. Fortunately there is no signs of active infection at this time. 02/25/2020 on evaluation today patient appears to be doing well with regard to her wound. She has been tolerating the dressing changes without complication. Fortunately there is no signs of active infection at this time. No fevers, chills, nausea, vomiting, or diarrhea. 03/17/2020 upon evaluation today patient's wound on the right leg seems to be doing quite well. I am very pleased with where things stand today. There does not appear to be any evidence of infection and overall I think she is managing quite nicely at this point. She is pleased. With that being said she does have some swelling of the left leg which is quite significant and again she has not really been checked for a possibility of a blood clot I think we may need to check a venous Doppler study. 03/24/2020 upon  evaluation today patient appears to be doing quite well with regard to her wounds currently. Fortunately there is no signs of active infection at this time. No fever or chills noted. With that being said she still has a lot of  edema of the left leg the DVT study was negative which is great news. I am very pleased in that regard. With that being said I do believe that the patient likely needs compression stockings in this area and to make sure that she keeps edema to control so she does not end up with issues. As well. Fortunately there is no signs of active infection at this time. 04/07/2020 on evaluation today patient appears to be doing okay in regard to her wound on the right lateral leg although the wrap hospital by the nurse yesterday apparently is not doing as well as we would like to see. It caused some kind of injury outside of exactly where the wound is and this again is new and unfortunate. With that being said there does not appear to be any signs of active infection which is great news. No fever chills noted 04/14/2020 upon evaluation today patient appears to be doing decently well in regard to her wounds. She has been tolerating the dressing changes without complication. Fortunately there does not appear to be any evidence of active infection at this time. No fevers, chills, nausea, vomiting, or diarrhea. She did well with the wraps staying on for the week and they state up no problems. With that being said she tells me that she just cannot come in every week here to the clinic. For that reason she wants [place] to get the home health nurses to come back out to see her. I can definitely do that but I am not going to continue to allow them to come out to see her if the wrap is not done appropriately. For that reason I do want her to make sure that it is done correctly. If it is not then we can need to just bring her in here weekly to make sure it is until we get this wound healed. 05/05/2020 upon evaluation today patient actually appears to be completely healed which is great news. Overall I am extremely pleased with where things stand today. I see no signs of active infection at this  point. Objective Constitutional Well-nourished and well-hydrated in no acute distress. Vitals Time Taken: 1:10 PM, Height: 60 in, Weight: 130 lbs, BMI: 25.4, Temperature: 97.6 F, Pulse: 88 bpm, Respiratory Rate: 20 breaths/min, Blood Pressure: 138/72 mmHg. Respiratory normal breathing without difficulty. Psychiatric this patient is able to make decisions and demonstrates good insight into disease process. Alert and Oriented x 3. pleasant and cooperative. General Notes: Upon inspection patient's wound bed actually showed signs of good epithelization at this time. There does not appear to be any evidence of active infection which is great news and overall very pleased with the fact that she is completely healed. The patient is very happy to hear this. Integumentary (Hair, Skin) Wound #6 status is Open. Original cause of wound was Gradually Appeared. The date acquired was: 03/14/2019. The wound has been in treatment 55 weeks. The wound is located on the Right Lower Leg. The wound measures 0cm length x 0cm width x 0cm depth; 0cm^2 area and 0cm^3 volume. Assessment Active Problems ICD-10 Chronic venous hypertension (idiopathic) with ulcer and inflammation of right lower extremity Non-pressure chronic ulcer of other part of right lower leg with fat layer exposed Lymphedema,  not elsewhere classified Plan [facility]charge From [facility] Services: Discharge from [place] Bathing/ Shower/ Hygiene: May shower and wash wound with soap and water. Edema Control - Lymphedema / SCD / Other: Elevate legs to the level of the heart or above for 30 minutes daily and/or when sitting, a frequency of: - throughout the day Avoid standing for long periods of time. Exercise regularly Compression stocking or Garment 20-30 mm/Hg pressure to: - both legs daily. Apply in the morning and remove at night. okay to change 3 times per week Home Health: [facility] for wound care. Other Home Health  Orders/Instructions: - [facility] 1. Would recommend currently that we going to continue with the wound care measures as before and the patient is in agreement with the plan this includes the use ongoing of her Velcro compression wraps that seem to be doing a great job. 2. I am good recommend that her and her caregiver continue to monitor for any signs of worsening or opening if anything pops up then she should let me know as soon as possible they state they will otherwise I am hopeful she will continue to do quite well. We will see her back for follow-up visit as needed. Electronic Signature(s) Signed: 05/05/2020 2:01:51 PM By: Worthy Keeler PA-C Entered By: Worthy Keeler on 05/05/2020 14:01:51 -------------------------------------------------------------------------------- SuperBill Details Patient Name: Date of Service: Erin [name] [name] 05/05/2020 Medical Record Number: [id number] Patient Account Number: [id number] Date of Birth/Sex: Treating RN: [birth year] (87 y.o. [name], [name] Primary Care Provider: [name] Other Clinician: Referring Provider: Treating Provider/Extender: [name] in Treatment: 55 Diagnosis Coding ICD-10 Codes Code Description [phone] Chronic venous hypertension (idiopathic) with ulcer and inflammation of right lower extremity L97.812 Non-pressure chronic ulcer of other part of right lower leg with fat layer exposed I89.0 Lymphedema, not elsewhere classified Facility Procedures CPT4 Code: [id number] Description: 99213 - WOUND CARE VISIT-LEV 3 EST PT Modifier: Quantity: 1 Physician Procedures : CPT4 Code Description Modifier [id number] - WC PHYS LEVEL 3 - EST PT ICD-10 Diagnosis Description I87.331 Chronic venous hypertension (idiopathic) with ulcer and inflammation of right lower extremity L97.812 Non-pressure chronic ulcer of other part  of right lower leg with fat layer exposed I89.0 Lymphedema, not elsewhere  classified Quantity: 1 Electronic Signature(s) Signed: 05/05/2020 2:02:35 PM By: Worthy Keeler PA-C Entered By: Worthy Keeler on 05/05/2020 14:02:34

## 2020-06-09 ENCOUNTER — Encounter (HOSPITAL_BASED_OUTPATIENT_CLINIC_OR_DEPARTMENT_OTHER): Payer: Medicare HMO | Attending: Physician Assistant | Admitting: Physician Assistant

## 2020-06-09 ENCOUNTER — Encounter (HOSPITAL_BASED_OUTPATIENT_CLINIC_OR_DEPARTMENT_OTHER): Payer: Medicare HMO | Admitting: Physician Assistant

## 2020-06-09 ENCOUNTER — Other Ambulatory Visit: Payer: Self-pay

## 2020-06-09 DIAGNOSIS — Z853 Personal history of malignant neoplasm of breast: Secondary | ICD-10-CM | POA: Diagnosis not present

## 2020-06-09 DIAGNOSIS — I890 Lymphedema, not elsewhere classified: Secondary | ICD-10-CM | POA: Diagnosis not present

## 2020-06-09 DIAGNOSIS — Z8616 Personal history of COVID-19: Secondary | ICD-10-CM | POA: Diagnosis not present

## 2020-06-09 DIAGNOSIS — I87331 Chronic venous hypertension (idiopathic) with ulcer and inflammation of right lower extremity: Secondary | ICD-10-CM | POA: Diagnosis not present

## 2020-06-09 DIAGNOSIS — L97812 Non-pressure chronic ulcer of other part of right lower leg with fat layer exposed: Secondary | ICD-10-CM | POA: Diagnosis not present

## 2020-06-09 NOTE — Progress Notes (Addendum)
RENAY, CRAMMER ([id number]) Visit Report for 06/09/2020 Chief Complaint Document Details Patient Name: Date of Service: Erin [name] [name] 06/09/2020 1:45 PM Medical Record Number: [id number] Patient Account Number: [id number] Date of Birth/Sex: Treating RN: [birth year] (87 y.o. [name] Primary Care Provider: [name] Other Clinician: Referring Provider: Treating Provider/Extender: [name] in Treatment: 60 Information Obtained from: Patient Chief Complaint 03/22/17; patient is here for review of wounds on her bilateral legs Electronic Signature(s) Signed: 06/09/2020 1:43:15 PM By: Worthy Keeler PA-C Entered By: Worthy Keeler on 06/09/2020 13:43:15 -------------------------------------------------------------------------------- HPI Details Patient Name: Date of Service: Erin [name] [name] 06/09/2020 1:45 PM Medical Record Number: [id number] Patient Account Number: [id number] Date of Birth/Sex: Treating RN: [birth year] (87 y.o. [name] Primary Care Provider: [name] Other Clinician: Referring Provider: Treating Provider/Extender: [name] in Treatment: 62 History of Present Illness HPI Description: We to find a new wound on the left 03/22/17; this is an 87 year old woman. There is not a lot of information in care everywhere on this patient. She had a history of a malignant neoplasm her left breast for which she had lumpectomy but she did not have radiation. Apparently she has had long- standing edema and her lower legs and she was seen in the wound care center at [facility] in [place] by [name] October. Both her legs were wrapped however it sounds as though the left leg became secondarily infected she apparently had MRSA and she was admitted the hospital. Not really turn for wound care. She lives at home on her own and has apparently [facility] home health care. We are not  exactly clear what [facility] is doing to her legs but it does not involve compression. She is not a diabetic. ABIs in our clinic were noncompressible bilaterally 03/30/17; this is a patient who has bilateral chronic venous insufficiency, severe venous inflammation. We had a nice description number legs from a note by [name] dating 2010 describing edematous warm legs with erythema. This suggests that she has chronic venous insufficiency with inflammation.. We have arterial studies dated 12/29/16 again from [facility]. This showed a right ABI at 1 and a TBI of 0.81 on the left the ABI was 1.25 and a TBI of 0.72. This was just she does not have significant arterial insufficiency. She had normal triphasic waveforms noted that she was admitted to hospital in late October with MSSA cellulitis Last visit we wrapped both her legs. She had an open area predominantly on the left anterior but weeping edema on the right anterior leg. She has done well there is only one at anterior left tibial wound 04/06/17; the patient has no open area on her right leg. Her remaining wound is on the left anterior leg, the left posterior leg wound is healed. We have been using silver alginate changed to [facility] on the left leg today she has home health 04/13/17; still no open area on her right leg. Her remaining wound on the left anterior leg. We have been using using Hydrofera Blue 04/20/17; no open area on the right leg although the edema here is somewhat disfiguring. Her remaining wound is on the left anterior leg we've been using Hydrofera Blue with improvement in dimensions. She has [facility] home health and lives in [place]. She requests to come every 2 weeks because of the distance involved in coming here 05/04/17; there is no open area  on the right leg. She has a very small left anterior leg wound that remains. However what remains on the left still has some depth and a not to viable looking  surface. It is too small to really attempt debridement. She has home health coming out to her home in [facility] 05/18/17; there is no open area on either leg. She has significant chronic venous insufficiency. The left anterior leg wound that was still open 2 weeks ago has closed. She has home health coming out. We are going to order Juxtalite stockings for both legs. She is agreed to pay for these privately. We will order them from [facility] READMISSION 04/14/2019 This is a patient we previously had in this clinic in 2019 for 3 months. She had a wound on the left anterior greater than right anterior leg. According to my notes we discharged her and juxta lite stockings although it does not appear that she ever got them. The history here is a bit difficult. She apparently has had a wound on the left lower leg for the last 6 months. She required admission to hospital in [facility] for apparent cellulitis just before Christmas was discharged home she has an open wound. She has a home health agency although we are not exactly sure which one. They are applying silver alginate. No compression Past medical history is reviewed she has chronic venous insufficiency and a history of left breast CA. 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 She did not have any compression on the wounds today. 2/8; substantial wound on the right posterior calf and a smaller area on the left medial calf. We are using Iodoflex to both wound areas under compression. We have better edema control We to find a new wound on the left anterior medial calf 2/15; the area on the left medial calf is closed over. Substantial area on the right posterior and lateral calf. We have been using Iodoflex although the patient complains of pain and wonders if there is not an alternative. She has home health changing the dressing twice a  week and she sees [place] once 2/22; the wound on the left medial calf is closed over. Her edema control is not good here but she cannot get on stockings. This substantial wound is on the right lateral and right posterior calf. Changed her to a hydrofera Blue last week. Gradual improvement in wound area. 3/8; wound on the left leg remains closed. The substantial wound is on the right lateral and right posterior calf. This is measuring smaller 3/22; 2-week follow-up. Substantial area on the right posterior and lateral calf measures slightly smaller. We are using Hydrofera Blue under compression 4/5; 2-week follow-up. Substantial area on the right posterior lateral calf. This measures slightly smaller. She has islands of epithelialization we have been using Hydrofera Blue under compression 4/19; 2-week follow-up. We have been using Hydrofera Blue however there is maceration around the wound. I will change to silver alginate today. She has islands of epithelialization however I did not see much evidence that things were improving here. 5/6; 2-week follow-up. I put silver alginate on this because of maceration around the wound last time. However she comes in today with a lot of very adherent debris on the wound of the right lateral leg. 5/17; 2-week follow-up. Using Hydrofera Blue under compression. The patient has epithelialization including islands of  epithelialization in the middle of fairly substantial wound area on the right posterior lateral calf 6/1; 2-week follow-up. Using Hydrofera Blue under compression. She seems to have increasing epithelialization. This is an irregular wound going from medial to lateral posteriorly in the right posterior calf. This would not be easy wound to get reproducible measurement 08/25/2019 upon evaluation today patient appears to actually be doing better in regard to the wound on her leg at this point. We have been using Adaptic followed by [facility] this is keeping  her from getting stuck. The patient is aware that this is also potentially slowing down her healing simply due to the fact that the [facility] not in direct contact with the wound bed. Nonetheless she was not happy with the way it was sticking previous which is why we switched to this according to what I can read in the note and hear from the patient and her aide with her today. 6/28; 2-week follow-up. We are following this patient for severe wound on the right lower leg secondary to chronic venous insufficiency using Hydrofera Blue under compression we are making gradual improvements. She arrives in clinic today with what looks like a contusion on the left anterior mid tibia. She says she was washing her leg and pulled some skin. However there is a fairly large wound with surrounding erythema and some swelling 7/12; 2-week follow-up. Right lower leg wound not quite as good as I thought. There is still islands of epithelialization we are using Hydrofera Blue with a contact layer I would like to see if we can get rid of the contact layer at this point. The new area from last time is a lot smaller She did not take the cephalexin I initially prescribed for the erythema edema around the left leg due to fears about diarrhea. She did tolerate doxycycline that we subsequently called in 7/26; we are doing 2-week follow-up. Wound measurements are about the same. Still the same fibrinous debris we have been using Hydrofera Blue. Change to Iodoflex today. 8/17; been using Iodoflex. Some improvement in the surface area of the large wound on the right posterior calf left anterior actually slightly larger. 100% [name] covered although it is less adherent 9/seven 3-week follow-up. Been using Iodoflex. We still have continued improvement in the surface area of the large wound on the right calf. The small area on the left anteriorly still is not closed. 01/14/2020 on evaluation today patient appears to be doing  decently well in regard to her leg ulcer. Fortunately there is no signs of active infection at this time. With that being said the wound on the right leg laterally is still open and given her trouble. Fortunately there is no signs of active infection at this time. No fevers, chills, nausea, vomiting, or diarrhea. 11/18; I have not seen this woman in quite some time. She told me she had Covid in September. She has chronic venous insufficiency with lymphedema and initially had wounds on her right lateral and left anterior lower legs. The left anterior leg is closed she is wearing a stocking 02/11/2020 upon evaluation today patient appears to be doing well with regard to her leg ulcer. This appears to be somewhat dry which is good although I think the Iodoflex is probably keeping this much too dry at this point. I think it may be time to switch to something different since it has accomplish the goal and we were kind of looking for. Fortunately there is no signs of  active infection at this time. 02/25/2020 on evaluation today patient appears to be doing well with regard to her wound. She has been tolerating the dressing changes without complication. Fortunately there is no signs of active infection at this time. No fevers, chills, nausea, vomiting, or diarrhea. 03/17/2020 upon evaluation today patient's wound on the right leg seems to be doing quite well. I am very pleased with where things stand today. There does not appear to be any evidence of infection and overall I think she is managing quite nicely at this point. She is pleased. With that being said she does have some swelling of the left leg which is quite significant and again she has not really been checked for a possibility of a blood clot I think we may need to check a venous Doppler study. 03/24/2020 upon evaluation today patient appears to be doing quite well with regard to her wounds currently. Fortunately there is no signs of active infection  at this time. No fever or chills noted. With that being said she still has a lot of edema of the left leg the DVT study was negative which is great news. I am very pleased in that regard. With that being said I do believe that the patient likely needs compression stockings in this area and to make sure that she keeps edema to control so she does not end up with issues. As well. Fortunately there is no signs of active infection at this time. 04/07/2020 on evaluation today patient appears to be doing okay in regard to her wound on the right lateral leg although the wrap hospital by the nurse yesterday apparently is not doing as well as we would like to see. It caused some kind of injury outside of exactly where the wound is and this again is new and unfortunate. With that being said there does not appear to be any signs of active infection which is great news. No fever chills noted 04/14/2020 upon evaluation today patient appears to be doing decently well in regard to her wounds. She has been tolerating the dressing changes without complication. Fortunately there does not appear to be any evidence of active infection at this time. No fevers, chills, nausea, vomiting, or diarrhea. She did well with the wraps staying on for the week and they state up no problems. With that being said she tells me that she just cannot come in every week here to the clinic. For that reason she wants [place] to get the home health nurses to come back out to see her. I can definitely do that but I am not going to continue to allow them to come out to see her if the wrap is not done appropriately. For that reason I do want her to make sure that it is done correctly. If it is not then we can need to just bring her in here weekly to make sure it is until we get this wound healed. 05/05/2020 upon evaluation today patient actually appears to be completely healed which is great news. Overall I am extremely pleased with where things  stand today. I see no signs of active infection at this point. 06/09/2020 patient presents for reevaluation here in the clinic she was actually seen last February 23 of this year. That is when she discharged this healed. Nonetheless based on what I am seeing right now it appears that she has cellulitis of the right lower extremity left lower extremity actually appears to be doing quite well and  I think the compression wraps are doing a good job for her. There is no signs of active infection on this side. Electronic Signature(s) Signed: 06/09/2020 3:49:31 PM By: Worthy Keeler PA-C Entered By: Worthy Keeler on 06/09/2020 15:49:31 -------------------------------------------------------------------------------- Physical Exam Details Patient Name: Date of Service: Erin [name] [name] 06/09/2020 1:45 PM Medical Record Number: [id number] Patient Account Number: [id number] Date of Birth/Sex: Treating RN: 1930-09-09 (87 y.o. [name] Primary Care Provider: [name] Other Clinician: Referring Provider: Treating Provider/Extender: [name] in Treatment: 33 Constitutional Well-nourished and well-hydrated in no acute distress. Respiratory normal breathing without difficulty. Psychiatric this patient is able to make decisions and demonstrates good insight into disease process. Alert and Oriented x 3. pleasant and cooperative. Notes Upon inspection patient's wounds again just appear to be small areas where she has been weeping from. There is no sustained wound that are noted at this point. She does have obvious swelling of the right lower extremity and erythema in the central portion of the leg extending from the ankle to just below the knee. This is warm to touch as well. Electronic Signature(s) Signed: 06/09/2020 3:50:06 PM By: Worthy Keeler PA-C Entered By: Worthy Keeler on 06/09/2020  15:50:06 -------------------------------------------------------------------------------- Physician Orders Details Patient Name: Date of Service: Erin [name] [name] 06/09/2020 1:45 PM Medical Record Number: [id number] Patient Account Number: [id number] Date of Birth/Sex: Treating RN: [birth year] (87 y.o. [name] Primary Care Provider: [name] Other Clinician: Referring Provider: Treating Provider/Extender: [name] in Treatment: 42 Verbal / Phone Orders: No Diagnosis Coding ICD-10 Coding Code Description I87.331 Chronic venous hypertension (idiopathic) with ulcer and inflammation of right lower extremity L97.812 Non-pressure chronic ulcer of other part of right lower leg with fat layer exposed I89.0 Lymphedema, not elsewhere classified Follow-up Appointments Return Appointment in 1 week. Bathing/ Shower/ Hygiene May shower with protection but do not get wound dressing(s) wet. - use cast protector to keep dry in the shower Edema Control - Lymphedema / SCD / Other Bilateral Lower Extremities Elevate legs to the level of the heart or above for 30 minutes daily and/or when sitting, a frequency of: - throughout the day Avoid standing for long periods of time. Exercise regularly Compression stocking or Garment 20-30 mm/Hg pressure to: - farrow wrap to left leg daily, apply in the morning and remove at night Non Wound Condition Other Non Wound Condition Orders/Instructions: - go to emergency room for fever, nausea/vomiting, or increased pain in right leg Wound Treatment Wound #10 - Lower Leg Wound Laterality: Right, Medial Peri-Wound Care: Triamcinolone 15 (g) Discharge Instructions: Use triamcinolone 15 (g) mix with lotion to lower leg Peri-Wound Care: Sween Lotion (Moisturizing lotion) Discharge Instructions: Apply moisturizing lotion as directed Prim Dressing: KerraCel Ag Gelling Fiber Dressing, 2x2 in (silver alginate) ary Discharge  Instructions: Apply silver alginate to any weeping areas Secondary Dressing: ABD Pad, 8x10 Discharge Instructions: Apply over primary dressing as directed. Compression Wrap: ThreePress (3 layer compression wrap) Discharge Instructions: Apply three layer compression as directed. Patient Medications llergies: No Known Allergies A Notifications Medication Indication Start End 06/09/2020 Bactrim DS DOSE 1 - oral 800 mg-160 mg tablet - 1 tablet oral taken 2 times per day for 14 days. Electronic Signature(s) Signed: 06/09/2020 3:51:42 PM By: Worthy Keeler PA-C Entered By: Worthy Keeler on 06/09/2020 15:51:42 -------------------------------------------------------------------------------- Problem List Details Patient Name: Date of Service: Erin [name] [name] 06/09/2020  1:45 PM Medical Record Number: [id number] Patient Account Number: [id number] Date of Birth/Sex: Treating RN: September 23, 1930 (87 y.o. [name] Primary Care Provider: [name] Other Clinician: Referring Provider: Treating Provider/Extender: [name] in Treatment: 98 Active Problems ICD-10 Encounter Code Description Active Date MDM Diagnosis I87.331 Chronic venous hypertension (idiopathic) with ulcer and inflammation of right 04/14/2019 No Yes lower extremity L97.812 Non-pressure chronic ulcer of other part of right lower leg with fat layer 04/14/2019 No Yes exposed I89.0 Lymphedema, not elsewhere classified 04/14/2019 No Yes Inactive Problems ICD-10 Code Description Active Date Inactive Date L97.822 Non-pressure chronic ulcer of other part of left lower leg with fat layer exposed 09/08/2019 09/08/2019 L97.821 Non-pressure chronic ulcer of other part of left lower leg limited to breakdown of skin 04/21/2019 04/21/2019 L03.116 Cellulitis of left lower limb 09/08/2019 09/08/2019 Resolved Problems Electronic Signature(s) Signed: 06/09/2020 1:43:09 PM By: Worthy Keeler PA-C Entered By:  Worthy Keeler on 06/09/2020 13:43:09 -------------------------------------------------------------------------------- Progress Note Details Patient Name: Date of Service: Erin [name] [name] 06/09/2020 1:45 PM Medical Record Number: [id number] Patient Account Number: [id number] Date of Birth/Sex: Treating RN: 10/02/1930 (87 y.o. [name] Primary Care Provider: [name] Other Clinician: Referring Provider: Treating Provider/Extender: [name] in Treatment: 60 Subjective Chief Complaint Information obtained from Patient 03/22/17; patient is here for review of wounds on her bilateral legs History of Present Illness (HPI) We to find a new wound on the left 03/22/17; this is an 87 year old woman. There is not a lot of information in care everywhere on this patient. She had a history of a malignant neoplasm her left breast for which she had lumpectomy but she did not have radiation. Apparently she has had long-standing edema and her lower legs and she was seen in the wound care center at [facility] in [place] by [name] October. Both her legs were wrapped however it sounds as though the left leg became secondarily infected she apparently had MRSA and she was admitted the hospital. Not really turn for wound care. She lives at home on her own and has apparently [facility] home health care. We are not exactly clear what [facility] is doing to her legs but it does not involve compression. She is not a diabetic. ABIs in our clinic were noncompressible bilaterally 03/30/17; this is a patient who has bilateral chronic venous insufficiency, severe venous inflammation. We had a nice description number legs from a note by [name] dating 2010 describing edematous warm legs with erythema. This suggests that she has chronic venous insufficiency with inflammation.. We have arterial studies dated 12/29/16 again from [facility]. This showed a right ABI  at 1 and a TBI of 0.81 on the left the ABI was 1.25 and a TBI of 0.72. This was just she does not have significant arterial insufficiency. She had normal triphasic waveforms noted that she was admitted to hospital in late October with MSSA cellulitis Last visit we wrapped both her legs. She had an open area predominantly on the left anterior but weeping edema on the right anterior leg. She has done well there is only one at anterior left tibial wound 04/06/17; the patient has no open area on her right leg. Her remaining wound is on the left anterior leg, the left posterior leg wound is healed. We have been using silver alginate changed to [facility] on the left leg today she has home health 04/13/17; still no open area on  her right leg. Her remaining wound on the left anterior leg. We have been using using Hydrofera Blue 04/20/17; no open area on the right leg although the edema here is somewhat disfiguring. Her remaining wound is on the left anterior leg we've been using Hydrofera Blue with improvement in dimensions. She has [facility] home health and lives in [place]. She requests to come every 2 weeks because of the distance involved in coming here 05/04/17; there is no open area on the right leg. She has a very small left anterior leg wound that remains. However what remains on the left still has some depth and a not to viable looking surface. It is too small to really attempt debridement. She has home health coming out to her home in [facility] 05/18/17; there is no open area on either leg. She has significant chronic venous insufficiency. The left anterior leg wound that was still open 2 weeks ago has closed. She has home health coming out. We are going to order Juxtalite stockings for both legs. She is agreed to pay for these privately. We will order them from [facility] READMISSION 04/14/2019 This is a patient we previously had in this clinic in 2019 for 3 months. She had a wound on the left  anterior greater than right anterior leg. According to my notes we discharged her and juxta lite stockings although it does not appear that she ever got them. The history here is a bit difficult. She apparently has had a wound on the left lower leg for the last 6 months. She required admission to hospital in [facility] for apparent cellulitis just before Christmas was discharged home she has an open wound. She has a home health agency although we are not exactly sure which one. They are applying silver alginate. No compression Past medical history is reviewed she has chronic venous insufficiency and a history of left breast CA. 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 She did not have any compression on the wounds today. 2/8; substantial wound on the right posterior calf and a smaller area on the left medial calf. We are using Iodoflex to both wound areas under compression. We have better edema control We to find a new wound on the left anterior medial calf 2/15; the area on the left medial calf is closed over. Substantial area on the right posterior and lateral calf. We have been using Iodoflex although the patient complains of pain and wonders if there is not an alternative. She has home health changing the dressing twice a week and she sees [place] once 2/22; the wound on the left medial calf is closed over. Her edema control is not good here but she cannot get on stockings. This substantial wound is on the right lateral and right posterior calf. Changed her to a hydrofera Blue last week. Gradual improvement in wound area. 3/8; wound on the left leg remains closed. The substantial wound is on the right lateral and right posterior calf. This is measuring smaller 3/22; 2-week follow-up. Substantial area on the right posterior and lateral calf measures slightly smaller. We are  using Hydrofera Blue under compression 4/5; 2-week follow-up. Substantial area on the right posterior lateral calf. This measures slightly smaller. She has islands of epithelialization we have been using Hydrofera Blue under compression 4/19; 2-week follow-up. We have been using Hydrofera Blue  however there is maceration around the wound. I will change to silver alginate today. She has islands of epithelialization however I did not see much evidence that things were improving here. 5/6; 2-week follow-up. I put silver alginate on this because of maceration around the wound last time. However she comes in today with a lot of very adherent debris on the wound of the right lateral leg. 5/17; 2-week follow-up. Using Hydrofera Blue under compression. The patient has epithelialization including islands of epithelialization in the middle of fairly substantial wound area on the right posterior lateral calf 6/1; 2-week follow-up. Using Hydrofera Blue under compression. She seems to have increasing epithelialization. This is an irregular wound going from medial to lateral posteriorly in the right posterior calf. This would not be easy wound to get reproducible measurement 08/25/2019 upon evaluation today patient appears to actually be doing better in regard to the wound on her leg at this point. We have been using Adaptic followed by [facility] this is keeping her from getting stuck. The patient is aware that this is also potentially slowing down her healing simply due to the fact that the [facility] not in direct contact with the wound bed. Nonetheless she was not happy with the way it was sticking previous which is why we switched to this according to what I can read in the note and hear from the patient and her aide with her today. 6/28; 2-week follow-up. We are following this patient for severe wound on the right lower leg secondary to chronic venous insufficiency using Hydrofera Blue under  compression we are making gradual improvements. She arrives in clinic today with what looks like a contusion on the left anterior mid tibia. She says she was washing her leg and pulled some skin. However there is a fairly large wound with surrounding erythema and some swelling 7/12; 2-week follow-up. Right lower leg wound not quite as good as I thought. There is still islands of epithelialization we are using Hydrofera Blue with a contact layer I would like to see if we can get rid of the contact layer at this point. ooThe new area from last time is a lot smaller ooShe did not take the cephalexin I initially prescribed for the erythema edema around the left leg due to fears about diarrhea. She did tolerate doxycycline that we subsequently called in 7/26; we are doing 2-week follow-up. Wound measurements are about the same. Still the same fibrinous debris we have been using Hydrofera Blue. Change to Iodoflex today. 8/17; been using Iodoflex. Some improvement in the surface area of the large wound on the right posterior calf left anterior actually slightly larger. 100% [name] covered although it is less adherent 9/seven 3-week follow-up. Been using Iodoflex. We still have continued improvement in the surface area of the large wound on the right calf. The small area on the left anteriorly still is not closed. 01/14/2020 on evaluation today patient appears to be doing decently well in regard to her leg ulcer. Fortunately there is no signs of active infection at this time. With that being said the wound on the right leg laterally is still open and given her trouble. Fortunately there is no signs of active infection at this time. No fevers, chills, nausea, vomiting, or diarrhea. 11/18; I have not seen this woman in quite some time. She told me she had Covid in September. She has chronic venous insufficiency with lymphedema and initially had wounds on her right lateral and left anterior lower  legs. The  left anterior leg is closed she is wearing a stocking 02/11/2020 upon evaluation today patient appears to be doing well with regard to her leg ulcer. This appears to be somewhat dry which is good although I think the Iodoflex is probably keeping this much too dry at this point. I think it may be time to switch to something different since it has accomplish the goal and we were kind of looking for. Fortunately there is no signs of active infection at this time. 02/25/2020 on evaluation today patient appears to be doing well with regard to her wound. She has been tolerating the dressing changes without complication. Fortunately there is no signs of active infection at this time. No fevers, chills, nausea, vomiting, or diarrhea. 03/17/2020 upon evaluation today patient's wound on the right leg seems to be doing quite well. I am very pleased with where things stand today. There does not appear to be any evidence of infection and overall I think she is managing quite nicely at this point. She is pleased. With that being said she does have some swelling of the left leg which is quite significant and again she has not really been checked for a possibility of a blood clot I think we may need to check a venous Doppler study. 03/24/2020 upon evaluation today patient appears to be doing quite well with regard to her wounds currently. Fortunately there is no signs of active infection at this time. No fever or chills noted. With that being said she still has a lot of edema of the left leg the DVT study was negative which is great news. I am very pleased in that regard. With that being said I do believe that the patient likely needs compression stockings in this area and to make sure that she keeps edema to control so she does not end up with issues. As well. Fortunately there is no signs of active infection at this time. 04/07/2020 on evaluation today patient appears to be doing okay in regard to her wound on the right  lateral leg although the wrap hospital by the nurse yesterday apparently is not doing as well as we would like to see. It caused some kind of injury outside of exactly where the wound is and this again is new and unfortunate. With that being said there does not appear to be any signs of active infection which is great news. No fever chills noted 04/14/2020 upon evaluation today patient appears to be doing decently well in regard to her wounds. She has been tolerating the dressing changes without complication. Fortunately there does not appear to be any evidence of active infection at this time. No fevers, chills, nausea, vomiting, or diarrhea. She did well with the wraps staying on for the week and they state up no problems. With that being said she tells me that she just cannot come in every week here to the clinic. For that reason she wants [place] to get the home health nurses to come back out to see her. I can definitely do that but I am not going to continue to allow them to come out to see her if the wrap is not done appropriately. For that reason I do want her to make sure that it is done correctly. If it is not then we can need to just bring her in here weekly to make sure it is until we get this wound healed. 05/05/2020 upon evaluation today patient actually appears to be completely  healed which is great news. Overall I am extremely pleased with where things stand today. I see no signs of active infection at this point. 06/09/2020 patient presents for reevaluation here in the clinic she was actually seen last February 23 of this year. That is when she discharged this healed. Nonetheless based on what I am seeing right now it appears that she has cellulitis of the right lower extremity left lower extremity actually appears to be doing quite well and I think the compression wraps are doing a good job for her. There is no signs of active infection on this side. Objective Constitutional Well-nourished  and well-hydrated in no acute distress. Vitals Time Taken: 2:35 PM, Height: 60 in, Weight: 130 lbs, BMI: 25.4, Temperature: 97.8 F, Pulse: 84 bpm, Respiratory Rate: 20 breaths/min, Blood Pressure: 192/79 mmHg. Respiratory normal breathing without difficulty. Psychiatric this patient is able to make decisions and demonstrates good insight into disease process. Alert and Oriented x 3. pleasant and cooperative. General Notes: Upon inspection patient's wounds again just appear to be small areas where she has been weeping from. There is no sustained wound that are noted at this point. She does have obvious swelling of the right lower extremity and erythema in the central portion of the leg extending from the ankle to just below the knee. This is warm to touch as well. Integumentary (Hair, Skin) Wound #10 status is Open. Original cause of wound was Gradually Appeared. The date acquired was: 06/02/2020. The wound is located on the Right,Medial Lower Leg. The wound measures 3cm length x 6cm width x 0.1cm depth; 14.137cm^2 area and 1.414cm^3 volume. There is Fat Layer (Subcutaneous Tissue) exposed. There is no tunneling or undermining noted. There is a medium amount of serosanguineous drainage noted. The wound margin is distinct with the outline attached to the wound base. There is large (67-100%) red, pink, pale granulation within the wound bed. There is no necrotic tissue within the wound bed. Assessment Active Problems ICD-10 Chronic venous hypertension (idiopathic) with ulcer and inflammation of right lower extremity Non-pressure chronic ulcer of other part of right lower leg with fat layer exposed Lymphedema, not elsewhere classified Procedures Wound #10 Pre-procedure diagnosis of Wound #10 is a Lymphedema located on the Right,Medial Lower Leg . There was a Three Layer Compression Therapy Procedure by Rhae Hammock, RN. Post procedure Diagnosis Wound #10: Same as  Pre-Procedure Plan Follow-up Appointments: Return Appointment in 1 week. Bathing/ Shower/ Hygiene: May shower with protection but do not get wound dressing(s) wet. - use cast protector to keep dry in the shower Edema Control - Lymphedema / SCD / Other: Elevate legs to the level of the heart or above for 30 minutes daily and/or when sitting, a frequency of: - throughout the day Avoid standing for long periods of time. Exercise regularly Compression stocking or Garment 20-30 mm/Hg pressure to: - farrow wrap to left leg daily, apply in the morning and remove at night Non Wound Condition: Other Non Wound Condition Orders/Instructions: - go to emergency room for fever, nausea/vomiting, or increased pain in right leg The following medication(s) was prescribed: Bactrim DS oral 800 mg-160 mg tablet 1 1 tablet oral taken 2 times per day for 14 days. starting 06/09/2020 WOUND #10: - Lower Leg Wound Laterality: Right, Medial Peri-Wound Care: Triamcinolone 15 (g) Discharge Instructions: Use triamcinolone 15 (g) mix with lotion to lower leg Peri-Wound Care: Sween Lotion (Moisturizing lotion) Discharge Instructions: Apply moisturizing lotion as directed Prim Dressing: KerraCel Ag Gelling Fiber Dressing, 2x2  in (silver alginate) ary Discharge Instructions: Apply silver alginate to any weeping areas Secondary Dressing: ABD Pad, 8x10 Discharge Instructions: Apply over primary dressing as directed. Com pression Wrap: ThreePress (3 layer compression wrap) Discharge Instructions: Apply three layer compression as directed. 1. Would recommend currently that we actually go ahead and initiate treatment with a 3 layer compression wrap that previously did well for her. The patient is tolerating that currently or rather has tolerated that previously without any complication. She does not like the wraps but she does very well with them. 2. I am also can recommend that we go ahead and get her on an antibiotic. I am  going to send in a prescription for Bactrim in order to see if we get this under control. I think that is okay for short-term for the patient. 3. I do think the patient needs to be elevating her legs as well we discussed all that previous and she is aware of that she does have a friend who helps take care of her as well at home and is keeping the wraps on her as far as her left leg is concerned. We will see patient back for reevaluation in 1 week here in the clinic. If anything worsens or changes patient will contact our office for additional recommendations. Electronic Signature(s) Signed: 06/09/2020 3:51:59 PM By: Worthy Keeler PA-C Entered By: Worthy Keeler on 06/09/2020 15:51:59 -------------------------------------------------------------------------------- SuperBill Details Patient Name: Date of Service: Erin [name] [name] 06/09/2020 Medical Record Number: [id number] Patient Account Number: [id number] Date of Birth/Sex: Treating RN: 1931/02/24 (87 y.o. [name], [name] Primary Care Provider: [name] Other Clinician: Referring Provider: Treating Provider/Extender: [name] in Treatment: 60 Diagnosis Coding ICD-10 Codes Code Description 9705512760 Chronic venous hypertension (idiopathic) with ulcer and inflammation of right lower extremity L97.812 Non-pressure chronic ulcer of other part of right lower leg with fat layer exposed I89.0 Lymphedema, not elsewhere classified Facility Procedures CPT4 Code: [id number] Description: (Facility Use Only) [phone] - [facility] L[facility] RT LEG Modifier: Quantity: 1 Physician Procedures : CPT4 Code Description Modifier [id number] - WC PHYS LEVEL 4 - EST PT ICD-10 Diagnosis Description I87.331 Chronic venous hypertension (idiopathic) with ulcer and inflammation of right lower extremity L97.812 Non-pressure chronic ulcer of other part  of right lower leg with fat layer exposed I89.0 Lymphedema, not  elsewhere classified Quantity: 1 Electronic Signature(s) Signed: 06/09/2020 3:52:10 PM By: Worthy Keeler PA-C Entered By: Worthy Keeler on 06/09/2020 15:52:09

## 2020-06-09 NOTE — Progress Notes (Signed)
Erin [name] ([id number]) Visit Report for 06/09/2020 Arrival Information Details Patient Name: Date of Service: Erin [name] [name] 06/09/2020 1:45 PM Medical Record Number: [id number] Patient Account Number: [id number] Date of Birth/Sex: Treating RN: 1930/11/01 (87 y.o. [name], [name] Primary Care Shamona Wirtz: [name] Other Clinician: Referring Darene Nappi: Treating Chayden Garrelts/Extender: [name] in Treatment: 36 Visit Information History Since Last Visit Added or deleted any medications: No Patient Arrived: Wheel Chair Any new allergies or adverse reactions: No Arrival Time: 14:35 Had a fall or experienced change in No Accompanied By: caregiver activities of daily living that may affect Transfer Assistance: None risk of falls: Patient Identification Verified: Yes Signs or symptoms of abuse/neglect since last visito No Secondary Verification Process Completed: Yes Hospitalized since last visit: No Patient Requires Transmission-Based Precautions: No Implantable device outside of the clinic excluding No Patient Has Alerts: Yes cellular tissue based products placed in the center Patient Alerts: NON COMPRESSABLE since last visit: Has Compression in Place as Prescribed: No Pain Present Now: No Notes patient had compression garment on left leg, but not the right leg. Electronic Signature(s) Signed: 06/09/2020 5:51:12 PM By: Deon Pilling Entered By: Deon Pilling on 06/09/2020 14:46:00 -------------------------------------------------------------------------------- Clinic Level of Care Assessment Details Patient Name: Date of Service: Erin [name] [name] 06/09/2020 1:45 PM Medical Record Number: [id number] Patient Account Number: [id number] Date of Birth/Sex: Treating RN: [birth year] (87 y.o. [name] Primary Care Kenzel Ruesch: [name] Other Clinician: Referring Ja Pistole: Treating Dharma Pare/Extender: [name] in Treatment: 60 Clinic Level of Care Assessment Items TOOL 1 Quantity Score []  - 0 Use when EandM and Procedure is performed on INITIAL visit ASSESSMENTS - Nursing Assessment / Reassessment X- 1 20 General Physical Exam (combine w/ comprehensive assessment (listed just below) when performed on new pt. evals) X- 1 25 Comprehensive Assessment (HX, ROS, Risk Assessments, Wounds Hx, etc.) ASSESSMENTS - Wound and Skin Assessment / Reassessment []  - 0 Dermatologic / Skin Assessment (not related to wound area) ASSESSMENTS - Ostomy and/or Continence Assessment and Care []  - 0 Incontinence Assessment and Management []  - 0 Ostomy Care Assessment and Management (repouching, etc.) PROCESS - Coordination of Care X - Simple Patient / Family Education for ongoing care 1 15 []  - 0 Complex (extensive) Patient / Family Education for ongoing care X- 1 10 Staff obtains Programmer, systems, Records, T Results / Process Orders est []  - 0 Staff telephones HHA, Nursing Homes / Clarify orders / etc []  - 0 Routine Transfer to another Facility (non-emergent condition) []  - 0 Routine Hospital Admission (non-emergent condition) X- 1 15 New Admissions / Biomedical engineer / Ordering NPWT Apligraf, etc. , []  - 0 Emergency Hospital Admission (emergent condition) PROCESS - Special Needs []  - 0 Pediatric / Minor Patient Management []  - 0 Isolation Patient Management []  - 0 Hearing / Language / Visual special needs []  - 0 Assessment of Community assistance (transportation, D/C planning, etc.) []  - 0 Additional assistance / Altered mentation []  - 0 Support Surface(s) Assessment (bed, cushion, seat, etc.) INTERVENTIONS - Miscellaneous []  - 0 External ear exam []  - 0 Patient Transfer (multiple staff / Civil Service fast streamer / Similar devices) []  - 0 Simple Staple / Suture removal (25 or less) []  - 0 Complex Staple / Suture removal (26 or more) []  - 0 Hypo/Hyperglycemic Management (do not check if  billed separately) []  - 0 Ankle / Brachial Index (ABI) - do not check if billed separately Has the  patient been seen at the hospital within the last three years: Yes Total Score: 85 Level Of Care: New/Established - Level 3 Electronic Signature(s) Signed: 06/09/2020 5:50:20 PM By: [name] RN, BSN Entered By: [name] on 06/09/2020 15:40:30 -------------------------------------------------------------------------------- Compression Therapy Details Patient Name: Date of Service: Erin [name] [name] 06/09/2020 1:45 PM Medical Record Number: [id number] Patient Account Number: [id number] Date of Birth/Sex: Treating RN: December 08, 1930 (87 y.o. [name] Primary Care Amazing Cowman: [name] Other Clinician: Referring Dossie Ocanas: Treating Deidre Carino/Extender: [name] in Treatment: 60 Compression Therapy Performed for Wound Assessment: Wound #10 Right,Medial Lower Leg Performed By: Clinician Rhae Hammock, RN Compression Type: Three Layer Post Procedure Diagnosis Same as Pre-procedure Electronic Signature(s) Signed: 06/09/2020 5:50:20 PM By: [name] RN, BSN Entered By: [name] on 06/09/2020 15:40:52 -------------------------------------------------------------------------------- Encounter Discharge Information Details Patient Name: Date of Service: Erin [name] [name] 06/09/2020 1:45 PM Medical Record Number: [id number] Patient Account Number: [id number] Date of Birth/Sex: Treating RN: 1931-03-09 (87 y.o. [name], [name] Primary Care Kane Kusek: [name] Other Clinician: Referring Otilia Kareem: Treating Odesser Tourangeau/Extender: [name] in Treatment: [id number] Encounter Discharge Information Items Discharge Condition: Stable Ambulatory Status: Wheelchair Discharge Destination: Home Transportation: Private Auto Accompanied By: [name] Schedule Follow-up Appointment: Yes Clinical Summary of Care:  Patient Declined Electronic Signature(s) Signed: 06/09/2020 5:43:14 PM By: Rhae Hammock RN Entered By: Rhae Hammock on 06/09/2020 15:57:47 -------------------------------------------------------------------------------- Lower Extremity Assessment Details Patient Name: Date of Service: Erin [name] [name] 06/09/2020 1:45 PM Medical Record Number: [id number] Patient Account Number: [id number] Date of Birth/Sex: Treating RN: [birth year] (87 y.o. [name], Tammi Klippel Primary Care Victory Strollo: [name] Other Clinician: Referring Zyren Sevigny: Treating Onyx Edgley/Extender: [name] in Treatment: 60 Edema Assessment Assessed: [Left: No] [Right: Yes] Edema: [Left: Yes] [Right: Yes] Calf Left: Right: Point of Measurement: 35 cm From Medial Instep 39 cm Ankle Left: Right: Point of Measurement: 9 cm From Medial Instep 24 cm Knee To Floor Left: Right: From Medial Instep 37 cm Vascular Assessment Pulses: Dorsalis Pedis Palpable: [Right:Yes] Electronic Signature(s) Signed: 06/09/2020 5:51:12 PM By: Deon Pilling Signed: 06/09/2020 5:51:12 PM By: Deon Pilling Entered By: Deon Pilling on 06/09/2020 14:47:13 -------------------------------------------------------------------------------- [place] Details Patient Name: Date of Service: Erin [name] [name] 06/09/2020 1:45 PM Medical Record Number: [id number] Patient Account Number: [id number] Date of Birth/Sex: Treating RN: 24-Sep-1930 (87 y.o. [name] Primary Care Amman Bartel: [name] Other Clinician: Referring Vahe Pienta: Treating Usha Slager/Extender: [name] in Treatment: 68 Multidisciplinary Care Plan reviewed with physician Active Inactive Soft Tissue Infection Nursing Diagnoses: Impaired tissue integrity Potential for infection: soft tissue Goals: Patient's soft tissue infection will resolve Date Initiated: 06/09/2020 Target  Resolution Date: 07/07/2020 Goal Status: Active Interventions: Assess signs and symptoms of infection every visit Provide education on infection Treatment Activities: Systemic antibiotics : 06/09/2020 Notes: Venous Leg Ulcer Nursing Diagnoses: Actual venous Insuffiency (use after diagnosis is confirmed) Knowledge deficit related to disease process and management Goals: Patient will maintain optimal edema control Date Initiated: 04/14/2019 Target Resolution Date: 07/07/2020 Goal Status: Active Patient/caregiver will verbalize understanding of disease process and disease management Date Initiated: 04/14/2019 Date Inactivated: 05/19/2019 Target Resolution Date: 05/16/2019 Goal Status: Met Interventions: Assess peripheral edema status every visit. Compression as ordered Provide education on venous insufficiency Notes: Electronic Signature(s) Signed: 06/09/2020 5:50:20 PM By: [name] RN, BSN Entered By: [name] on 06/09/2020 15:39:32 -------------------------------------------------------------------------------- Pain Assessment Details Patient  Name: Date of Service: Erin [name] [name] 06/09/2020 1:45 PM Medical Record Number: [id number] Patient Account Number: [id number] Date of Birth/Sex: Treating RN: 1931-03-07 (87 y.o. [name] Primary Care Kalob Bergen: [name] Other Clinician: Referring Kiyah Demartini: Treating Merced Hanners/Extender: [name] in Treatment: 48 Active Problems Location of Pain Severity and Description of Pain Patient Has Paino No Site Locations Rate the pain. Current Pain Level: 0 Pain Management and Medication Current Pain Management: Medication: No Cold Application: No Rest: No Massage: No Activity: No T.E.N.S.: No Heat Application: No Leg drop or elevation: No Is the Current Pain Management Adequate: Adequate How does your wound impact your activities of daily livingo Sleep: No Bathing: No Appetite:  No Relationship With Others: No Bladder Continence: No Emotions: No Bowel Continence: No Work: No Toileting: No Drive: No Dressing: No Hobbies: No Electronic Signature(s) Signed: 06/09/2020 5:51:12 PM By: Deon Pilling Entered By: Deon Pilling on 06/09/2020 14:46:49 -------------------------------------------------------------------------------- Patient/Caregiver Education Details Patient Name: Date of Service: Erin [name] [name] 3/30/2022andnbsp1:45 PM Medical Record Number: [id number] Patient Account Number: [id number] Date of Birth/Gender: Treating RN: 03/01/1931 (87 y.o. [name] Primary Care Physician: [name] Other Clinician: Referring Physician: Treating Physician/Extender: [name] in Treatment: 74 Education Assessment Education Provided To: Patient Education Topics Provided Infection: Methods: Explain/Verbal Responses: Reinforcements needed, State content correctly Venous: Methods: Explain/Verbal Responses: Reinforcements needed, State content correctly Electronic Signature(s) Signed: 06/09/2020 5:50:20 PM By: [name] RN, BSN Entered By: [name] on 06/09/2020 15:40:05 -------------------------------------------------------------------------------- Wound Assessment Details Patient Name: Date of Service: Erin [name] [name] 06/09/2020 1:45 PM Medical Record Number: [id number] Patient Account Number: [id number] Date of Birth/Sex: Treating RN: Aug 05, 1930 (87 y.o. [name], [name] Primary Care Patrisha Hausmann: [name] Other Clinician: Referring Amarea Macdowell: Treating Eliakim Tendler/Extender: [name] in Treatment: 60 Wound Status Wound Number: 10 Primary Etiology: Lymphedema Wound Location: Right, Medial Lower Leg Secondary Venous Leg Ulcer Etiology: Wounding Event: Gradually Appeared Wound Status: Open Date Acquired: 06/02/2020 Comorbid Cataracts, Glaucoma, Middle ear  problems, Anemia, Weeks Of Treatment: 0 History: Hypertension Clustered Wound: Yes Photos Wound Measurements Length: (cm) 3 Width: (cm) 6 Depth: (cm) 0.1 Clustered Quantity: 3 Area: (cm) 14.137 Volume: (cm) 1.414 % Reduction in Area: 0% % Reduction in Volume: 0% Epithelialization: None Tunneling: No Undermining: No Wound Description Classification: Full Thickness Without Exposed Support Structures Wound Margin: Distinct, outline attached Exudate Amount: Medium Exudate Type: Serosanguineous Exudate Color: red, brown Foul Odor After Cleansing: No [name]/Fibrino No Wound Bed Granulation Amount: Large (67-100%) Exposed Structure Granulation Quality: Red, Pink, Pale Fascia Exposed: No Necrotic Amount: None Present (0%) Fat Layer (Subcutaneous Tissue) Exposed: Yes Tendon Exposed: No Muscle Exposed: No Joint Exposed: No Bone Exposed: No Treatment Notes Wound #10 (Lower Leg) Wound Laterality: Right, Medial Cleanser Peri-Wound Care Triamcinolone 15 (g) Discharge Instruction: Use triamcinolone 15 (g) mix with lotion to lower leg Sween Lotion (Moisturizing lotion) Discharge Instruction: Apply moisturizing lotion as directed Topical Primary Dressing KerraCel Ag Gelling Fiber Dressing, 2x2 in (silver alginate) Discharge Instruction: Apply silver alginate to any weeping areas Secondary Dressing ABD Pad, 8x10 Discharge Instruction: Apply over primary dressing as directed. Secured With Compression Wrap ThreePress (3 layer compression wrap) Discharge Instruction: Apply three layer compression as directed. Compression Stockings Add-Ons Electronic Signature(s) Signed: 06/09/2020 5:25:01 PM By: Sandre Kitty Signed: 06/09/2020 5:51:12 PM By: Deon Pilling Entered By: Sandre Kitty on 06/09/2020 16:57:11 -------------------------------------------------------------------------------- Vitals Details Patient Name: Date of Service:  Erin [name] [name] 06/09/2020 1:45  PM Medical Record Number: [id number] Patient Account Number: [id number] Date of Birth/Sex: Treating RN: [birth year] (87 y.o. [name], Tammi Klippel Primary Care Abyan Cadman: [name] Other Clinician: Referring Fonda Rochon: Treating Tobenna Needs/Extender: [name] in Treatment: 60 Vital Signs Time Taken: 14:35 Temperature (F): 97.8 Height (in): 60 Pulse (bpm): 84 Weight (lbs): 130 Respiratory Rate (breaths/min): 20 Body Mass Index (BMI): 25.4 Blood Pressure (mmHg): 192/79 Reference Range: 80 - 120 mg / dl Electronic Signature(s) Signed: 06/09/2020 5:51:12 PM By: Deon Pilling Signed: 06/09/2020 5:51:12 PM By: Deon Pilling Entered By: Deon Pilling on 06/09/2020 14:46:23

## 2020-06-14 ENCOUNTER — Encounter (HOSPITAL_BASED_OUTPATIENT_CLINIC_OR_DEPARTMENT_OTHER): Payer: Medicare HMO | Attending: Internal Medicine | Admitting: Internal Medicine

## 2020-06-14 ENCOUNTER — Other Ambulatory Visit: Payer: Self-pay

## 2020-06-14 DIAGNOSIS — I87331 Chronic venous hypertension (idiopathic) with ulcer and inflammation of right lower extremity: Secondary | ICD-10-CM | POA: Insufficient documentation

## 2020-06-14 DIAGNOSIS — L97812 Non-pressure chronic ulcer of other part of right lower leg with fat layer exposed: Secondary | ICD-10-CM | POA: Diagnosis not present

## 2020-06-14 DIAGNOSIS — L03115 Cellulitis of right lower limb: Secondary | ICD-10-CM | POA: Diagnosis not present

## 2020-06-14 DIAGNOSIS — Z853 Personal history of malignant neoplasm of breast: Secondary | ICD-10-CM | POA: Diagnosis not present

## 2020-06-14 DIAGNOSIS — I890 Lymphedema, not elsewhere classified: Secondary | ICD-10-CM | POA: Insufficient documentation

## 2020-06-14 NOTE — Progress Notes (Signed)
Erin, [name] ([id number]) Visit Report for 06/14/2020 HPI Details Patient Name: Date of Service: Erin [name] [name] 06/14/2020 12:30 PM Medical Record Number: [id number] Patient Account Number: [id number] Date of Birth/Sex: Treating RN: 06/07/1930 (87 y.o. [name] Primary Care Provider: [name] Other Clinician: Referring Provider: Treating Provider/Extender: Volney American in Treatment: 34 History of Present Illness HPI Description: We to find a new wound on the left 03/22/17; this is an 87 year old woman. There is not a lot of information in care everywhere on this patient. She had a history of a malignant neoplasm her left breast for which she had lumpectomy but she did not have radiation. Apparently she has had long- standing edema and her lower legs and she was seen in the wound care center at [facility] in  [place] by [name] October. Both her legs were wrapped however it sounds as though the left leg became secondarily infected she apparently had MRSA and she was admitted the hospital. Not really turn for wound care. She lives at home on her own and has apparently [facility] home health care. We are not exactly clear what [facility] is doing to her legs but it does not involve compression. She is not a diabetic. ABIs in our clinic were noncompressible bilaterally 03/30/17; this is a patient who has bilateral chronic venous insufficiency, severe venous inflammation. We had a nice description number legs from a note by [name] dating 2010 describing edematous warm legs with erythema. This suggests that she has chronic venous insufficiency with inflammation.. We have arterial studies dated 12/29/16 again from [facility]. This showed a right ABI at 1 and a TBI of 0.81 on the left the ABI was 1.25 and a TBI of 0.72. This was just she does not have significant arterial insufficiency. She had normal triphasic waveforms noted that she  was admitted to hospital in late October with MSSA cellulitis Last visit we wrapped both her legs. She had an open area predominantly on the left anterior but weeping edema on the right anterior leg. She has done well there is only one at anterior left tibial wound 04/06/17; the patient has no open area on her right leg. Her remaining wound is on the left anterior leg, the left posterior leg wound is healed. We have been using silver alginate changed to [facility] on the left leg today she has home health 04/13/17; still no open area on her right leg. Her remaining wound on the left anterior leg. We have been using using Hydrofera Blue 04/20/17; no open area on the right leg although the edema here is somewhat disfiguring. Her remaining wound is on the left anterior leg we've been using Hydrofera Blue with improvement in dimensions. She has [facility] home health and lives in [place]. She requests to come every 2 weeks because of the distance involved in coming here 05/04/17; there is no open area on the right leg. She has a very small left anterior leg wound that remains. However what remains on the left still has some depth and a not to viable looking surface. It is too small to really attempt debridement. She has home health coming out to her home in [facility] 05/18/17; there is no open area on either leg. She has significant chronic venous insufficiency. The left anterior leg wound that was still open 2 weeks ago has closed. She has home health coming out. We are going to order Juxtalite stockings for both legs.  She is agreed to pay for these privately. We will order them from [facility] READMISSION 04/14/2019 This is a patient we previously had in this clinic in 2019 for 3 months. She had a wound on the left anterior greater than right anterior leg. According to my notes we discharged her and juxta lite stockings although it does not appear that she ever got them. The history here is a bit  difficult. She apparently has had a wound on the left lower leg for the last 6 months. She required admission to hospital in [facility] for apparent cellulitis just before Christmas was discharged home she has an open wound. She has a home health agency although we are not exactly sure which one. They are applying silver alginate. No compression Past medical history is reviewed she has chronic venous insufficiency and a history of left breast CA. 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 She did not have any compression on the wounds today. 2/8; substantial wound on the right posterior calf and a smaller area on the left medial calf. We are using Iodoflex to both wound areas under compression. We have better edema control We to find a new wound on the left anterior medial calf 2/15; the area on the left medial calf is closed over. Substantial area on the right posterior and lateral calf. We have been using Iodoflex although the patient complains of pain and wonders if there is not an alternative. She has home health changing the dressing twice a week and she sees [place] once 2/22; the wound on the left medial calf is closed over. Her edema control is not good here but she cannot get on stockings. This substantial wound is on the right lateral and right posterior calf. Changed her to a hydrofera Blue last week. Gradual improvement in wound area. 3/8; wound on the left leg remains closed. The substantial wound is on the right lateral and right posterior calf. This is measuring smaller 3/22; 2-week follow-up. Substantial area on the right posterior and lateral calf measures slightly smaller. We are using Hydrofera Blue under compression 4/5; 2-week follow-up. Substantial area on the right posterior lateral calf. This measures slightly smaller. She has islands of epithelialization we  have been using Hydrofera Blue under compression 4/19; 2-week follow-up. We have been using Hydrofera Blue however there is maceration around the wound. I will change to silver alginate today. She has islands of epithelialization however I did not see much evidence that things were improving here. 5/6; 2-week follow-up. I put silver alginate on this because of maceration around the wound last time. However she comes in today with a lot of very adherent debris on the wound of the right lateral leg. 5/17; 2-week follow-up. Using Hydrofera Blue under compression. The patient has epithelialization including islands of epithelialization in the middle of fairly substantial wound area on the right posterior lateral calf 6/1; 2-week follow-up. Using Hydrofera Blue under compression. She seems to have increasing epithelialization. This is an irregular wound going from medial to lateral posteriorly in the right posterior calf. This would not be easy wound to get reproducible measurement 08/25/2019 upon evaluation today patient appears to actually be doing better in regard to the wound on her leg at this point. We have been using Adaptic followed by [facility] this is keeping her from getting stuck. The patient is  aware that this is also potentially slowing down her healing simply due to the fact that the [facility] not in direct contact with the wound bed. Nonetheless she was not happy with the way it was sticking previous which is why we switched to this according to what I can read in the note and hear from the patient and her aide with her today. 6/28; 2-week follow-up. We are following this patient for severe wound on the right lower leg secondary to chronic venous insufficiency using Hydrofera Blue under compression we are making gradual improvements. She arrives in clinic today with what looks like a contusion on the left anterior mid tibia. She says she was washing her leg and pulled some skin.  However there is a fairly large wound with surrounding erythema and some swelling 7/12; 2-week follow-up. Right lower leg wound not quite as good as I thought. There is still islands of epithelialization we are using Hydrofera Blue with a contact layer I would like to see if we can get rid of the contact layer at this point. The new area from last time is a lot smaller She did not take the cephalexin I initially prescribed for the erythema edema around the left leg due to fears about diarrhea. She did tolerate doxycycline that we subsequently called in 7/26; we are doing 2-week follow-up. Wound measurements are about the same. Still the same fibrinous debris we have been using Hydrofera Blue. Change to Iodoflex today. 8/17; been using Iodoflex. Some improvement in the surface area of the large wound on the right posterior calf left anterior actually slightly larger. 100% [name] covered although it is less adherent 9/seven 3-week follow-up. Been using Iodoflex. We still have continued improvement in the surface area of the large wound on the right calf. The small area on the left anteriorly still is not closed. 01/14/2020 on evaluation today patient appears to be doing decently well in regard to her leg ulcer. Fortunately there is no signs of active infection at this time. With that being said the wound on the right leg laterally is still open and given her trouble. Fortunately there is no signs of active infection at this time. No fevers, chills, nausea, vomiting, or diarrhea. 11/18; I have not seen this woman in quite some time. She told me she had Covid in September. She has chronic venous insufficiency with lymphedema and initially had wounds on her right lateral and left anterior lower legs. The left anterior leg is closed she is wearing a stocking 02/11/2020 upon evaluation today patient appears to be doing well with regard to her leg ulcer. This appears to be somewhat dry which is good although  I think the Iodoflex is probably keeping this much too dry at this point. I think it may be time to switch to something different since it has accomplish the goal and we were kind of looking for. Fortunately there is no signs of active infection at this time. 02/25/2020 on evaluation today patient appears to be doing well with regard to her wound. She has been tolerating the dressing changes without complication. Fortunately there is no signs of active infection at this time. No fevers, chills, nausea, vomiting, or diarrhea. 03/17/2020 upon evaluation today patient's wound on the right leg seems to be doing quite well. I am very pleased with where things stand today. There does not appear to be any evidence of infection and overall I think she is managing quite nicely at this point. She is  pleased. With that being said she does have some swelling of the left leg which is quite significant and again she has not really been checked for a possibility of a blood clot I think we may need to check a venous Doppler study. 03/24/2020 upon evaluation today patient appears to be doing quite well with regard to her wounds currently. Fortunately there is no signs of active infection at this time. No fever or chills noted. With that being said she still has a lot of edema of the left leg the DVT study was negative which is great news. I am very pleased in that regard. With that being said I do believe that the patient likely needs compression stockings in this area and to make sure that she keeps edema to control so she does not end up with issues. As well. Fortunately there is no signs of active infection at this time. 04/07/2020 on evaluation today patient appears to be doing okay in regard to her wound on the right lateral leg although the wrap hospital by the nurse yesterday apparently is not doing as well as we would like to see. It caused some kind of injury outside of exactly where the wound is and this again is  new and unfortunate. With that being said there does not appear to be any signs of active infection which is great news. No fever chills noted 04/14/2020 upon evaluation today patient appears to be doing decently well in regard to her wounds. She has been tolerating the dressing changes without complication. Fortunately there does not appear to be any evidence of active infection at this time. No fevers, chills, nausea, vomiting, or diarrhea. She did well with the wraps staying on for the week and they state up no problems. With that being said she tells me that she just cannot come in every week here to the clinic. For that reason she wants [place] to get the home health nurses to come back out to see her. I can definitely do that but I am not going to continue to allow them to come out to see her if the wrap is not done appropriately. For that reason I do want her to make sure that it is done correctly. If it is not then we can need to just bring her in here weekly to make sure it is until we get this wound healed. 05/05/2020 upon evaluation today patient actually appears to be completely healed which is great news. Overall I am extremely pleased with where things stand today. I see no signs of active infection at this point. 06/09/2020 patient presents for reevaluation here in the clinic she was actually seen last February 23 of this year. That is when she discharged this healed. Nonetheless based on what I am seeing right now it appears that she has cellulitis of the right lower extremity left lower extremity actually appears to be doing quite well and I think the compression wraps are doing a good job for her. There is no signs of active infection on this side. 4/4; patient patient returns to clinic. Most of the wounds in the right leg are completely healed and/or epithelialized however some of them look vulnerable. She still has erythema limited to the distal part of her foot but there is no tenderness  here. She is not a diabetic. She is completing her antibiotics. Edema control in the right leg is good Electronic Signature(s) Signed: 06/14/2020 4:59:32 PM By: Linton Ham MD Entered By: Dellia Nims,  Jiles Goya on 06/14/2020 13:20:39 -------------------------------------------------------------------------------- Physical Exam Details Patient Name: Date of Service: Erin [name] [name] 06/14/2020 12:30 PM Medical Record Number: [id number] Patient Account Number: [id number] Date of Birth/Sex: Treating RN: [birth year] (87 y.o. [name] Primary Care Provider: [name] Other Clinician: Referring Provider: Treating Provider/Extender: Volney American in Treatment: 69 Constitutional Patient is hypertensive.. Pulse regular and within target range for patient.Marland Kitchen Respirations regular, non-labored and within target range.. Temperature is normal and within the target range for the patient.Marland Kitchen Appears in no distress. Notes Wound exam; everything looks mostly closed here. The areas I think that where the problem on the right medial calf and right anterior mid lower leg looks somewhat vulnerable but are epithelialized. There is no leaking edema fluid. She has erythema in the distal right forefoot just beyond her toes dorsally. This is warm but it is not tender at all I think this is localized edema from her wraps Electronic Signature(s) Signed: 06/14/2020 4:59:32 PM By: Linton Ham MD Entered By: Linton Ham on 06/14/2020 13:21:39 -------------------------------------------------------------------------------- Physician Orders Details Patient Name: Date of Service: Erin [name] [name] 06/14/2020 12:30 PM Medical Record Number: [id number] Patient Account Number: [id number] Date of Birth/Sex: Treating RN: 04-14-1930 (87 y.o. [name] Primary Care Provider: [name] Other Clinician: Referring Provider: Treating Provider/Extender: Volney American in Treatment: 55 Verbal / Phone Orders: No Diagnosis Coding ICD-10 Coding Code Description I87.331 Chronic venous hypertension (idiopathic) with ulcer and inflammation of right lower extremity L97.812 Non-pressure chronic ulcer of other part of right lower leg with fat layer exposed I89.0 Lymphedema, not elsewhere classified Follow-up Appointments Return Appointment in 1 week. Bathing/ Shower/ Hygiene May shower with protection but do not get wound dressing(s) wet. - use cast protector to keep dry in the shower Edema Control - Lymphedema / SCD / Other Bilateral Lower Extremities Elevate legs to the level of the heart or above for 30 minutes daily and/or when sitting, a frequency of: - throughout the day Avoid standing for long periods of time. Exercise regularly Compression stocking or Garment 20-30 mm/Hg pressure to: - farrow wrap to left leg daily, apply in the morning and remove at night Non Wound Condition Other Non Wound Condition Orders/Instructions: - go to emergency room for fever, nausea/vomiting, or increased pain in right leg Wound Treatment Wound #10 - Lower Leg Wound Laterality: Right, Medial Peri-Wound Care: Triamcinolone 15 (g) Discharge Instructions: Use triamcinolone 15 (g) mix with lotion to lower leg Peri-Wound Care: Sween Lotion (Moisturizing lotion) Discharge Instructions: Apply moisturizing lotion as directed Prim Dressing: KerraCel Ag Gelling Fiber Dressing, 2x2 in (silver alginate) ary Discharge Instructions: Apply silver alginate to any weeping areas Secondary Dressing: ABD Pad, 8x10 Discharge Instructions: Apply over primary dressing as directed. Compression Wrap: ThreePress (3 layer compression wrap) Discharge Instructions: Apply three layer compression as directed. Electronic Signature(s) Signed: 06/14/2020 4:59:32 PM By: Linton Ham MD Signed: 06/14/2020 5:00:21 PM By: [name] RN, BSN Entered By: [name] on  06/14/2020 13:13:54 -------------------------------------------------------------------------------- Problem List Details Patient Name: Date of Service: Erin [name] [name] 06/14/2020 12:30 PM Medical Record Number: [id number] Patient Account Number: [id number] Date of Birth/Sex: Treating RN: 1930-05-21 (87 y.o. [name] Primary Care Provider: [name] Other Clinician: Referring Provider: Treating Provider/Extender: Volney American in Treatment: 46 Active Problems ICD-10 Encounter Code Description Active Date MDM Diagnosis I87.331 Chronic venous hypertension (idiopathic) with ulcer and inflammation of right 04/14/2019 No Yes  lower extremity L97.812 Non-pressure chronic ulcer of other part of right lower leg with fat layer 04/14/2019 No Yes exposed I89.0 Lymphedema, not elsewhere classified 04/14/2019 No Yes Inactive Problems ICD-10 Code Description Active Date Inactive Date L97.822 Non-pressure chronic ulcer of other part of left lower leg with fat layer exposed 09/08/2019 09/08/2019 L97.821 Non-pressure chronic ulcer of other part of left lower leg limited to breakdown of skin 04/21/2019 04/21/2019 L03.116 Cellulitis of left lower limb 09/08/2019 09/08/2019 Resolved Problems Electronic Signature(s) Signed: 06/14/2020 4:59:32 PM By: Linton Ham MD Entered By: Linton Ham on 06/14/2020 13:17:01 -------------------------------------------------------------------------------- Progress Note Details Patient Name: Date of Service: Erin [name] [name] 06/14/2020 12:30 PM Medical Record Number: [id number] Patient Account Number: [id number] Date of Birth/Sex: Treating RN: 08/19/1930 (87 y.o. [name] Primary Care Provider: Other Clinician: [name] Referring Provider: Treating Provider/Extender: Volney American in Treatment: 61 Subjective History of Present Illness (HPI) We to find a new wound on the left  03/22/17; this is an 87 year old woman. There is not a lot of information in care everywhere on this patient. She had a history of a malignant neoplasm her left breast for which she had lumpectomy but she did not have radiation. Apparently she has had long-standing edema and her lower legs and she was seen in the wound care center at [facility] in [place] by [name] October. Both her legs were wrapped however it sounds as though the left leg became secondarily infected she apparently had MRSA and she was admitted the hospital. Not really turn for wound care. She lives at home on her own and has apparently [facility] home health care. We are not exactly clear what [facility] is doing to her legs but it does not involve compression. She is not a diabetic. ABIs in our clinic were noncompressible bilaterally 03/30/17; this is a patient who has bilateral chronic venous insufficiency, severe venous inflammation. We had a nice description number legs from a note by [name] dating 2010 describing edematous warm legs with erythema. This suggests that she has chronic venous insufficiency with inflammation.. We have arterial studies dated 12/29/16 again from [facility]. This showed a right ABI at 1 and a TBI of 0.81 on the left the ABI was 1.25 and a TBI of 0.72. This was just she does not have significant arterial insufficiency. She had normal triphasic waveforms noted that she was admitted to hospital in late October with MSSA cellulitis Last visit we wrapped both her legs. She had an open area predominantly on the left anterior but weeping edema on the right anterior leg. She has done well there is only one at anterior left tibial wound 04/06/17; the patient has no open area on her right leg. Her remaining wound is on the left anterior leg, the left posterior leg wound is healed. We have been using silver alginate changed to [facility] on the left leg today she has home health 04/13/17; still  no open area on her right leg. Her remaining wound on the left anterior leg. We have been using using Hydrofera Blue 04/20/17; no open area on the right leg although the edema here is somewhat disfiguring. Her remaining wound is on the left anterior leg we've been using Hydrofera Blue with improvement in dimensions. She has [facility] home health and lives in [place]. She requests to come every 2 weeks because of the distance involved in coming here 05/04/17; there is no open area on the right  leg. She has a very small left anterior leg wound that remains. However what remains on the left still has some depth and a not to viable looking surface. It is too small to really attempt debridement. She has home health coming out to her home in [facility] 05/18/17; there is no open area on either leg. She has significant chronic venous insufficiency. The left anterior leg wound that was still open 2 weeks ago has closed. She has home health coming out. We are going to order Juxtalite stockings for both legs. She is agreed to pay for these privately. We will order them from [facility] READMISSION 04/14/2019 This is a patient we previously had in this clinic in 2019 for 3 months. She had a wound on the left anterior greater than right anterior leg. According to my notes we discharged her and juxta lite stockings although it does not appear that she ever got them. The history here is a bit difficult. She apparently has had a wound on the left lower leg for the last 6 months. She required admission to hospital in [facility] for apparent cellulitis just before Christmas was discharged home she has an open wound. She has a home health agency although we are not exactly sure which one. They are applying silver alginate. No compression Past medical history is reviewed she has chronic venous insufficiency and a history of left breast CA. She also has a history of MRSA in the wound ABIs were not done in the clinic today  however she did have noninvasive arterial studies in 2018 which are actually quite good. She did not have any compression on the wounds today. 2/8; substantial wound on the right posterior calf and a smaller area on the left medial calf. We are using Iodoflex to both wound areas under compression. We have better edema control We to find a new wound on the left anterior medial calf 2/15; the area on the left medial calf is closed over. Substantial area on the right posterior and lateral calf. We have been using Iodoflex although the patient complains of pain and wonders if there is not an alternative. She has home health changing the dressing twice a week and she sees [place] once 2/22; the wound on the left medial calf is closed over. Her edema control is not good here but she cannot get on stockings. This substantial wound is on the right lateral and right posterior calf. Changed her to a hydrofera Blue last week. Gradual improvement in wound area. 3/8; wound on the left leg remains closed. The substantial wound is on the right lateral and right posterior calf. This is measuring smaller 3/22; 2-week follow-up. Substantial area on the right posterior and lateral calf measures slightly smaller. We are using Hydrofera Blue under compression 4/5; 2-week follow-up. Substantial area on the right posterior lateral calf. This measures slightly smaller. She has islands of epithelialization we have been using Hydrofera Blue under compression 4/19; 2-week follow-up. We have been using Hydrofera Blue however there is maceration around the wound. I will change to silver alginate today. She has islands of epithelialization however I did not see much evidence that things were improving here. 5/6; 2-week follow-up. I put silver alginate on this because of maceration around the wound last time. However she comes in today with a lot of very adherent debris on the wound of the right lateral leg. 5/17; 2-week follow-up.  Using Hydrofera Blue under compression. The patient has epithelialization including islands of epithelialization in the  middle of fairly substantial wound area on the right posterior lateral calf 6/1; 2-week follow-up. Using Hydrofera Blue under compression. She seems to have increasing epithelialization. This is an irregular wound going from medial to lateral posteriorly in the right posterior calf. This would not be easy wound to get reproducible measurement 08/25/2019 upon evaluation today patient appears to actually be doing better in regard to the wound on her leg at this point. We have been using Adaptic followed by [facility] this is keeping her from getting stuck. The patient is aware that this is also potentially slowing down her healing simply due to the fact that the [facility] not in direct contact with the wound bed. Nonetheless she was not happy with the way it was sticking previous which is why we switched to this according to what I can read in the note and hear from the patient and her aide with her today. 6/28; 2-week follow-up. We are following this patient for severe wound on the right lower leg secondary to chronic venous insufficiency using Hydrofera Blue under compression we are making gradual improvements. She arrives in clinic today with what looks like a contusion on the left anterior mid tibia. She says she was washing her leg and pulled some skin. However there is a fairly large wound with surrounding erythema and some swelling 7/12; 2-week follow-up. Right lower leg wound not quite as good as I thought. There is still islands of epithelialization we are using Hydrofera Blue with a contact layer I would like to see if we can get rid of the contact layer at this point. ooThe new area from last time is a lot smaller ooShe did not take the cephalexin I initially prescribed for the erythema edema around the left leg due to fears about diarrhea. She did tolerate  doxycycline that we subsequently called in 7/26; we are doing 2-week follow-up. Wound measurements are about the same. Still the same fibrinous debris we have been using Hydrofera Blue. Change to Iodoflex today. 8/17; been using Iodoflex. Some improvement in the surface area of the large wound on the right posterior calf left anterior actually slightly larger. 100% [name] covered although it is less adherent 9/seven 3-week follow-up. Been using Iodoflex. We still have continued improvement in the surface area of the large wound on the right calf. The small area on the left anteriorly still is not closed. 01/14/2020 on evaluation today patient appears to be doing decently well in regard to her leg ulcer. Fortunately there is no signs of active infection at this time. With that being said the wound on the right leg laterally is still open and given her trouble. Fortunately there is no signs of active infection at this time. No fevers, chills, nausea, vomiting, or diarrhea. 11/18; I have not seen this woman in quite some time. She told me she had Covid in September. She has chronic venous insufficiency with lymphedema and initially had wounds on her right lateral and left anterior lower legs. The left anterior leg is closed she is wearing a stocking 02/11/2020 upon evaluation today patient appears to be doing well with regard to her leg ulcer. This appears to be somewhat dry which is good although I think the Iodoflex is probably keeping this much too dry at this point. I think it may be time to switch to something different since it has accomplish the goal and we were kind of looking for. Fortunately there is no signs of active infection at this  time. 02/25/2020 on evaluation today patient appears to be doing well with regard to her wound. She has been tolerating the dressing changes without complication. Fortunately there is no signs of active infection at this time. No fevers, chills, nausea,  vomiting, or diarrhea. 03/17/2020 upon evaluation today patient's wound on the right leg seems to be doing quite well. I am very pleased with where things stand today. There does not appear to be any evidence of infection and overall I think she is managing quite nicely at this point. She is pleased. With that being said she does have some swelling of the left leg which is quite significant and again she has not really been checked for a possibility of a blood clot I think we may need to check a venous Doppler study. 03/24/2020 upon evaluation today patient appears to be doing quite well with regard to her wounds currently. Fortunately there is no signs of active infection at this time. No fever or chills noted. With that being said she still has a lot of edema of the left leg the DVT study was negative which is great news. I am very pleased in that regard. With that being said I do believe that the patient likely needs compression stockings in this area and to make sure that she keeps edema to control so she does not end up with issues. As well. Fortunately there is no signs of active infection at this time. 04/07/2020 on evaluation today patient appears to be doing okay in regard to her wound on the right lateral leg although the wrap hospital by the nurse yesterday apparently is not doing as well as we would like to see. It caused some kind of injury outside of exactly where the wound is and this again is new and unfortunate. With that being said there does not appear to be any signs of active infection which is great news. No fever chills noted 04/14/2020 upon evaluation today patient appears to be doing decently well in regard to her wounds. She has been tolerating the dressing changes without complication. Fortunately there does not appear to be any evidence of active infection at this time. No fevers, chills, nausea, vomiting, or diarrhea. She did well with the wraps staying on for the week and they  state up no problems. With that being said she tells me that she just cannot come in every week here to the clinic. For that reason she wants [place] to get the home health nurses to come back out to see her. I can definitely do that but I am not going to continue to allow them to come out to see her if the wrap is not done appropriately. For that reason I do want her to make sure that it is done correctly. If it is not then we can need to just bring her in here weekly to make sure it is until we get this wound healed. 05/05/2020 upon evaluation today patient actually appears to be completely healed which is great news. Overall I am extremely pleased with where things stand today. I see no signs of active infection at this point. 06/09/2020 patient presents for reevaluation here in the clinic she was actually seen last February 23 of this year. That is when she discharged this healed. Nonetheless based on what I am seeing right now it appears that she has cellulitis of the right lower extremity left lower extremity actually appears to be doing quite well and I think the compression  wraps are doing a good job for her. There is no signs of active infection on this side. 4/4; patient patient returns to clinic. Most of the wounds in the right leg are completely healed and/or epithelialized however some of them look vulnerable. She still has erythema limited to the distal part of her foot but there is no tenderness here. She is not a diabetic. She is completing her antibiotics. Edema control in the right leg is good Objective Constitutional Patient is hypertensive.. Pulse regular and within target range for patient.Marland Kitchen Respirations regular, non-labored and within target range.. Temperature is normal and within the target range for the patient.Marland Kitchen Appears in no distress. Vitals Time Taken: 12:39 PM, Height: 60 in, Weight: 130 lbs, BMI: 25.4, Temperature: 97.9 F, Pulse: 74 bpm, Respiratory Rate: 17 breaths/min,  Blood Pressure: 202/74 mmHg. General Notes: Wound exam; everything looks mostly closed here. The areas I think that where the problem on the right medial calf and right anterior mid lower leg looks somewhat vulnerable but are epithelialized. There is no leaking edema fluid. ooShe has erythema in the distal right forefoot just beyond her toes dorsally. This is warm but it is not tender at all I think this is localized edema from her wraps Integumentary (Hair, Skin) Wound #10 status is Open. Original cause of wound was Gradually Appeared. The date acquired was: 06/02/2020. The wound is located on the Right,Medial Lower Leg. The wound measures 0.4cm length x 0.4cm width x 0.1cm depth; 0.126cm^2 area and 0.013cm^3 volume. There is Fat Layer (Subcutaneous Tissue) exposed. There is no tunneling or undermining noted. There is a medium amount of serosanguineous drainage noted. The wound margin is distinct with the outline attached to the wound base. There is large (67-100%) red, pink, pale granulation within the wound bed. There is a small (1-33%) amount of necrotic tissue within the wound bed. Assessment Active Problems ICD-10 Chronic venous hypertension (idiopathic) with ulcer and inflammation of right lower extremity Non-pressure chronic ulcer of other part of right lower leg with fat layer exposed Lymphedema, not elsewhere classified Procedures Wound #10 Pre-procedure diagnosis of Wound #10 is a Lymphedema located on the Right,Medial Lower Leg . There was a Three Layer Compression Therapy Procedure by [name], RN. Post procedure Diagnosis Wound #10: Same as Pre-Procedure Plan Follow-up Appointments: Return Appointment in 1 week. Bathing/ Shower/ Hygiene: May shower with protection but do not get wound dressing(s) wet. - use cast protector to keep dry in the shower Edema Control - Lymphedema / SCD / Other: Elevate legs to the level of the heart or above for 30 minutes daily and/or when  sitting, a frequency of: - throughout the day Avoid standing for long periods of time. Exercise regularly Compression stocking or Garment 20-30 mm/Hg pressure to: - farrow wrap to left leg daily, apply in the morning and remove at night Non Wound Condition: Other Non Wound Condition Orders/Instructions: - go to emergency room for fever, nausea/vomiting, or increased pain in right leg WOUND #10: - Lower Leg Wound Laterality: Right, Medial Peri-Wound Care: Triamcinolone 15 (g) Discharge Instructions: Use triamcinolone 15 (g) mix with lotion to lower leg Peri-Wound Care: Sween Lotion (Moisturizing lotion) Discharge Instructions: Apply moisturizing lotion as directed Prim Dressing: KerraCel Ag Gelling Fiber Dressing, 2x2 in (silver alginate) ary Discharge Instructions: Apply silver alginate to any weeping areas Secondary Dressing: ABD Pad, 8x10 Discharge Instructions: Apply over primary dressing as directed. Com pression Wrap: ThreePress (3 layer compression wrap) Discharge Instructions: Apply three layer compression as directed.  1. I put her back in wrapping on the right leg 3 layer compression wrap. 2. She has Farrow wraps for the left leg. I am assuming she is putting these on properly although I do not know for sure. Electronic Signature(s) Signed: 06/14/2020 4:59:32 PM By: Linton Ham MD Entered By: Linton Ham on 06/14/2020 13:22:18 -------------------------------------------------------------------------------- SuperBill Details Patient Name: Date of Service: [name], CHA [name] 06/14/2020 Medical Record Number: [id number] Patient Account Number: [id number] Date of Birth/Sex: Treating RN: [birth year] (87 y.o. [name] Primary Care Provider: [name] Other Clinician: Referring Provider: Treating Provider/Extender: Volney American in Treatment: 61 Diagnosis Coding ICD-10 Codes Code Description [phone] Chronic venous hypertension  (idiopathic) with ulcer and inflammation of right lower extremity L97.812 Non-pressure chronic ulcer of other part of right lower leg with fat layer exposed I89.0 Lymphedema, not elsewhere classified Facility Procedures CPT4 Code: [id number] (F Description: acility Use Only) [id number] - APPLY MULTLAY COMPRS LWR RT LEG Modifier: Quantity: 1 Physician Procedures : CPT4 Code Description Modifier [id number] - WC PHYS LEVEL 3 - EST PT ICD-10 Diagnosis Description I87.331 Chronic venous hypertension (idiopathic) with ulcer and inflammation of right lower extremity L97.812 Non-pressure chronic ulcer of other part  of right lower leg with fat layer exposed Quantity: 1 Electronic Signature(s) Signed: 06/14/2020 4:59:32 PM By: Linton Ham MD Entered By: Linton Ham on 06/14/2020 13:22:38

## 2020-06-16 NOTE — Progress Notes (Signed)
FARRIS, BLASH ([id number]) Visit Report for 06/14/2020 Arrival Information Details Patient Name: Date of Service: Erin [name] [name] 06/14/2020 12:30 PM Medical Record Number: [id number] Patient Account Number: [id number] Date of Birth/Sex: Treating RN: [birth year] (87 y.o. [name], [name] Primary Care Sherina Stammer: [name] Other Clinician: Referring Jamoni Broadfoot: Treating Larya Charpentier/Extender: [employer] in Treatment: 24 Visit Information History Since Last Visit Added or deleted any medications: No Patient Arrived: Wheel Chair Any new allergies or adverse reactions: No Arrival Time: 12:38 Had a fall or experienced change in No Accompanied By: fami activities of daily living that may affect Transfer Assistance: None risk of falls: Patient Identification Verified: Yes Signs or symptoms of abuse/neglect since last visito No Secondary Verification Process Completed: Yes Hospitalized since last visit: No Patient Requires Transmission-Based Precautions: No Implantable device outside of the clinic excluding No Patient Has Alerts: Yes cellular tissue based products placed in the center Patient Alerts: NON COMPRESSABLE since last visit: Has Dressing in Place as Prescribed: Yes Pain Present Now: No Electronic Signature(s) Signed: 06/16/2020 5:28:50 PM By: Rhae Hammock RN Entered By: Rhae Hammock on 06/14/2020 12:39:08 -------------------------------------------------------------------------------- Compression Therapy Details Patient Name: Date of Service: Erin [name] [name] 06/14/2020 12:30 PM Medical Record Number: [id number] Patient Account Number: [id number] Date of Birth/Sex: Treating RN: 1931-02-13 (87 y.o. [name] Primary Care Kenyetta Wimbish: [name] Other Clinician: Referring Curry Dulski: Treating Luwana Butrick/Extender: [employer] in Treatment: 61 Compression Therapy Performed for Wound Assessment:  Wound #10 Right,Medial Lower Leg Performed By: Clinician [name], RN Compression Type: Three Layer Post Procedure Diagnosis Same as Pre-procedure Electronic Signature(s) Signed: 06/14/2020 5:00:21 PM By: [name] RN, BSN Entered By: [name] on 06/14/2020 13:14:49 -------------------------------------------------------------------------------- Encounter Discharge Information Details Patient Name: Date of Service: Erin [name] [name] 06/14/2020 12:30 PM Medical Record Number: [id number] Patient Account Number: [id number] Date of Birth/Sex: Treating RN: [birth year] (87 y.o. [name], Tammi Klippel Primary Care Talyssa Gibas: [name] Other Clinician: Referring Hagop Mccollam: Treating Alauna Hayden/Extender: [employer] in Treatment: 24 Encounter Discharge Information Items Discharge Condition: Stable Ambulatory Status: Wheelchair Discharge Destination: Home Transportation: Private Auto Accompanied By: caregiver Schedule Follow-up Appointment: Yes Clinical Summary of Care: Electronic Signature(s) Signed: 06/14/2020 4:57:52 PM By: Deon Pilling Entered By: Deon Pilling on 06/14/2020 14:29:48 -------------------------------------------------------------------------------- Lower Extremity Assessment Details Patient Name: Date of Service: Erin [name] [name] 06/14/2020 12:30 PM Medical Record Number: [id number] Patient Account Number: [id number] Date of Birth/Sex: Treating RN: [birth year] (87 y.o. [name], [name] Primary Care Andon Villard: [name] Other Clinician: Referring Emigdio Wildeman: Treating Heru Montz/Extender: [employer] in Treatment: 61 Edema Assessment Assessed: Shirlyn Goltz: No] Patrice Paradise: Yes] Edema: [Left: Yes] [Right: Yes] Calf Left: Right: Point of Measurement: 35 cm From Medial Instep 39 cm Ankle Left: Right: Point of Measurement: 9 cm From Medial Instep 24 cm Vascular Assessment Pulses: Dorsalis  Pedis Palpable: [Right:Yes] Posterior Tibial Palpable: [Right:Yes] Electronic Signature(s) Signed: 06/16/2020 5:28:50 PM By: Rhae Hammock RN Entered By: Rhae Hammock on 06/14/2020 12:47:02 -------------------------------------------------------------------------------- Multi Wound Chart Details Patient Name: Date of Service: Erin [name] [name] 06/14/2020 12:30 PM Medical Record Number: [id number] Patient Account Number: [id number] Date of Birth/Sex: Treating RN: 1930/07/25 (87 y.o. [name] Primary Care Lura Falor: [name] Other Clinician: Referring Seif Teichert: Treating Onofrio Klemp/Extender: [employer] in Treatment: 61 Vital Signs Height(in): 60 Pulse(bpm): 74 Weight(lbs): 130 Blood Pressure(mmHg): 202/74 Body Mass Index(BMI): 25 Temperature(F): 97.9 Respiratory Rate(breaths/min): 17 Photos: [  10:No Photos Right, Medial Lower Leg] [N/A:N/A N/A] Wound Location: [10:Gradually Appeared] [N/A:N/A] Wounding Event: [10:Lymphedema] [N/A:N/A] Primary Etiology: [10:Venous Leg Ulcer] [N/A:N/A] Secondary Etiology: [10:Cataracts, Glaucoma, Middle ear] [N/A:N/A] Comorbid History: [10:problems, Anemia, Hypertension 06/02/2020] [N/A:N/A] Date Acquired: [10:0] [N/A:N/A] Weeks of Treatment: [10:Open] [N/A:N/A] Wound Status: [10:Yes] [N/A:N/A] Clustered Wound: [10:3] [N/A:N/A] Clustered Quantity: [10:0.4x0.4x0.1] [N/A:N/A] Measurements L x W x D (cm) [10:0.126] [N/A:N/A] A (cm) : rea [10:0.013] [N/A:N/A] Volume (cm) : [10:99.10%] [N/A:N/A] % Reduction in Area: [10:99.10%] [N/A:N/A] % Reduction in Volume: [10:Full Thickness Without Exposed] [N/A:N/A] Classification: [10:Support Structures Medium] [N/A:N/A] Exudate Amount: [10:Serosanguineous] [N/A:N/A] Exudate Type: [10:red, brown] [N/A:N/A] Exudate Color: [10:Distinct, outline attached] [N/A:N/A] Wound Margin: [10:Large (67-100%)] [N/A:N/A] Granulation Amount: [10:Red, Pink, Pale]  [N/A:N/A] Granulation Quality: [10:Small (1-33%)] [N/A:N/A] Necrotic Amount: [10:Fat Layer (Subcutaneous Tissue): Yes N/A] Exposed Structures: [10:Fascia: No Tendon: No Muscle: No Joint: No Bone: No None] [N/A:N/A] Epithelialization: [10:Compression Therapy] [N/A:N/A] Treatment Notes Electronic Signature(s) Signed: 06/14/2020 4:59:32 PM By: Linton Ham MD Signed: 06/14/2020 5:00:21 PM By: [name] RN, BSN Entered By: Linton Ham on 06/14/2020 13:17:07 -------------------------------------------------------------------------------- Multi-Disciplinary Care Plan Details Patient Name: Date of Service: Erin [name] [name] 06/14/2020 12:30 PM Medical Record Number: [id number] Patient Account Number: [id number] Date of Birth/Sex: Treating RN: Apr 30, 1930 (87 y.o. [name] Primary Care Salimatou Simone: [name] Other Clinician: Referring Alexxis Mackert: Treating Floree Zuniga/Extender: [employer] in Treatment: 51 Multidisciplinary Care Plan reviewed with physician Active Inactive Soft Tissue Infection Nursing Diagnoses: Impaired tissue integrity Potential for infection: soft tissue Goals: Patient's soft tissue infection will resolve Date Initiated: 06/09/2020 Target Resolution Date: 07/07/2020 Goal Status: Active Interventions: Assess signs and symptoms of infection every visit Provide education on infection Treatment Activities: Education provided on Infection : 06/09/2020 Systemic antibiotics : 06/09/2020 Notes: Venous Leg Ulcer Nursing Diagnoses: Actual venous Insuffiency (use after diagnosis is confirmed) Knowledge deficit related to disease process and management Goals: Patient will maintain optimal edema control Date Initiated: 04/14/2019 Target Resolution Date: 07/07/2020 Goal Status: Active Patient/caregiver will verbalize understanding of disease process and disease management Date Initiated: 04/14/2019 Date Inactivated: 05/19/2019 Target  Resolution Date: 05/16/2019 Goal Status: Met Interventions: Assess peripheral edema status every visit. Compression as ordered Provide education on venous insufficiency Notes: Electronic Signature(s) Signed: 06/14/2020 5:00:21 PM By: [name] RN, BSN Entered By: [name] on 06/14/2020 12:41:04 -------------------------------------------------------------------------------- Pain Assessment Details Patient Name: Date of Service: Erin [name] [name] 06/14/2020 12:30 PM Medical Record Number: [id number] Patient Account Number: [id number] Date of Birth/Sex: Treating RN: 1930-06-15 (87 y.o. [name], [name] Primary Care Dyami Umbach: [name] Other Clinician: Referring Kimiyo Carmicheal: Treating Chealsey Miyamoto/Extender: [employer] in Treatment: 61 Active Problems Location of Pain Severity and Description of Pain Patient Has Paino No Site Locations Pain Management and Medication Current Pain Management: Electronic Signature(s) Signed: 06/16/2020 5:28:50 PM By: Rhae Hammock RN Entered By: Rhae Hammock on 06/14/2020 12:40:38 -------------------------------------------------------------------------------- Patient/Caregiver Education Details Patient Name: Date of Service: Erin [name] [name] 4/4/2022andnbsp12:30 PM Medical Record Number: [id number] Patient Account Number: [id number] Date of Birth/Gender: Treating RN: 01-31-1931 (87 y.o. [name] Primary Care Physician: [name] Other Clinician: Referring Physician: Treating Physician/Extender: [employer]ney [employer] in Treatment: 46 Education Assessment Education Provided To: Patient Education Topics Provided Wound/Skin Impairment: Methods: Explain/Verbal Responses: State content correctly [employer]) Signed: 06/14/2020 5:00:21 PM By: [name] RN, BSN Entered By: [name] on 06/14/2020  12:41:17 -------------------------------------------------------------------------------- Wound Assessment Details Patient Name: Date of Service: Erin [name]  [name] 06/14/2020 12:30 PM Medical Record Number: [id number] Patient Account Number: [id number] Date of Birth/Sex: Treating RN: 1930/04/25 (87 y.o. [name], [place] Primary Care Britainy Kozub: [name] Other Clinician: Referring Azam Gervasi: Treating Skie Vitrano/Extender: [employer]ney [employer] in Treatment: 61 Wound Status Wound Number: 10 Primary Etiology: Lymphedema Wound Location: Right, Medial Lower Leg Secondary Venous Leg Ulcer Etiology: Wounding Event: Gradually Appeared Wound Status: Open Date Acquired: 06/02/2020 Comorbid Cataracts, Glaucoma, Middle ear problems, Anemia, Weeks Of Treatment: 0 History: Hypertension Clustered Wound: Yes Photos Wound Measurements Length: (cm) 0.4 Width: (cm) 0.4 Depth: (cm) 0.1 Clustered Quantity: 3 Area: (cm) 0.126 Volume: (cm) 0.013 % Reduction in Area: 99.1% % Reduction in Volume: 99.1% Epithelialization: None Tunneling: No Undermining: No Wound Description Classification: Full Thickness Without Exposed Support Structures Wound Margin: Distinct, outline attached Exudate Amount: Medium Exudate Type: Serosanguineous Exudate Color: red, brown Foul Odor After Cleansing: No [name]/Fibrino Yes Wound Bed Granulation Amount: Large (67-100%) Exposed Structure Granulation Quality: Red, Pink, Pale Fascia Exposed: No Necrotic Amount: Small (1-33%) Fat Layer (Subcutaneous Tissue) Exposed: Yes Tendon Exposed: No Muscle Exposed: No Joint Exposed: No Bone Exposed: No Treatment Notes Wound #10 (Lower Leg) Wound Laterality: Right, Medial Cleanser Peri-Wound Care Triamcinolone 15 (g) Discharge Instruction: Use triamcinolone 15 (g) mix with lotion to lower leg Sween Lotion (Moisturizing lotion) Discharge Instruction: Apply moisturizing lotion as  directed Topical Primary Dressing KerraCel Ag Gelling Fiber Dressing, 2x2 in (silver alginate) Discharge Instruction: Apply silver alginate to any weeping areas Secondary Dressing ABD Pad, 8x10 Discharge Instruction: Apply over primary dressing as directed. Secured With Compression Wrap ThreePress (3 layer compression wrap) Discharge Instruction: Apply three layer compression as directed. Compression Stockings Add-Ons Electronic Signature(s) Signed: 06/15/2020 2:26:07 PM By: Sandre Kitty Signed: 06/16/2020 5:28:50 PM By: Rhae Hammock RN Entered By: Sandre Kitty on 06/14/2020 16:34:14 -------------------------------------------------------------------------------- Vitals Details Patient Name: Date of Service: Erin [name] [name] 06/14/2020 12:30 PM Medical Record Number: [id number] Patient Account Number: [id number] Date of Birth/Sex: Treating RN: 17-Jan-1931 (87 y.o. [name], [name] Primary Care Cohen Doleman: [name] Other Clinician: Referring Zuleyka Kloc: Treating Alin Hutchins/Extender: [employer] in Treatment: 61 Vital Signs Time Taken: 12:39 Temperature (F): 97.9 Height (in): 60 Pulse (bpm): 74 Weight (lbs): 130 Respiratory Rate (breaths/min): 17 Body Mass Index (BMI): 25.4 Blood Pressure (mmHg): 202/74 Reference Range: 80 - 120 mg / dl Electronic Signature(s) Signed: 06/16/2020 5:28:50 PM By: Rhae Hammock RN Entered By: Rhae Hammock on 06/14/2020 12:40:26

## 2020-06-21 ENCOUNTER — Encounter (HOSPITAL_BASED_OUTPATIENT_CLINIC_OR_DEPARTMENT_OTHER): Payer: Medicare HMO | Admitting: Internal Medicine

## 2020-06-21 ENCOUNTER — Other Ambulatory Visit: Payer: Self-pay

## 2020-06-21 DIAGNOSIS — L03115 Cellulitis of right lower limb: Secondary | ICD-10-CM | POA: Diagnosis not present

## 2020-06-21 DIAGNOSIS — I87331 Chronic venous hypertension (idiopathic) with ulcer and inflammation of right lower extremity: Secondary | ICD-10-CM | POA: Diagnosis not present

## 2020-06-21 NOTE — Progress Notes (Signed)
Erin, [name] ([id number]) Visit Report for 06/21/2020 Chief Complaint Document Details Patient Name: Date of Service: Erin [name] [name] 06/21/2020 1:15 PM Medical Record Number: [id number] Patient Account Number: [id number] Date of Birth/Sex: Treating RN: 1930/04/08 (87 y.o. [name] Primary Care Provider: [name] Other Clinician: Referring Provider: Treating Provider/Extender: Almira Bar in Treatment: 10 Information Obtained from: Patient Chief Complaint 03/22/17; patient is here for review of wounds on her bilateral legs 4/11; right lower extremity swelling and erythema, small wounds to the medial lower right leg Electronic Signature(s) Signed: 06/21/2020 4:22:52 PM By: Kalman Shan DO Entered By: Kalman Shan on 06/21/2020 15:01:45 -------------------------------------------------------------------------------- HPI Details Patient Name: Date of Service: Erin [name], CHA [name] 06/21/2020 1:15 PM Medical Record Number: [id number] Patient Account Number: [id number] Date of Birth/Sex: Treating RN: 1930/07/18 (87 y.o. [name] Primary Care Provider: [name] Other Clinician: Referring Provider: Treating Provider/Extender: Volney American in Treatment: 19 History of Present Illness HPI Description: We to find a new wound on the left 03/22/17; this is an 87 year old woman. There is not a lot of information in care everywhere on this patient. She had a history of a malignant neoplasm her left breast for which she had lumpectomy but she did not have radiation. Apparently she has had long- standing edema and her lower legs and she was seen in the wound care center at [facility] in [place] by [name] October. Both her legs were wrapped however it sounds as though the left leg became secondarily infected she apparently had MRSA and she was admitted the hospital. Not really turn for wound  care. She lives at home on her own and has apparently [facility] home health care. We are not exactly clear what [facility] is doing to her legs but it does not involve compression. She is not a diabetic. ABIs in our clinic were noncompressible bilaterally 03/30/17; this is a patient who has bilateral chronic venous insufficiency, severe venous inflammation. We had a nice description number legs from a note by [name] dating 2010 describing edematous warm legs with erythema. This suggests that she has chronic venous insufficiency with inflammation.. We have arterial studies dated 12/29/16 again from [facility]. This showed a right ABI at 1 and a TBI of 0.81 on the left the ABI was 1.25 and a TBI of 0.72. This was just she does not have significant arterial insufficiency. She had normal triphasic waveforms noted that she was admitted to hospital in late October with MSSA cellulitis Last visit we wrapped both her legs. She had an open area predominantly on the left anterior but weeping edema on the right anterior leg. She has done well there is only one at anterior left tibial wound 04/06/17; the patient has no open area on her right leg. Her remaining wound is on the left anterior leg, the left posterior leg wound is healed. We have been using silver alginate changed to [facility] on the left leg today she has home health 04/13/17; still no open area on her right leg. Her remaining wound on the left anterior leg. We have been using using Hydrofera Blue 04/20/17; no open area on the right leg although the edema here is somewhat disfiguring. Her remaining wound is on the left anterior leg we've been using Hydrofera Blue with improvement in dimensions. She has [facility] home health and lives in [place]. She requests to come every 2 weeks because of the  distance involved in coming here 05/04/17; there is no open area on the right leg. She has a very small left anterior leg wound that  remains. However what remains on the left still has some depth and a not to viable looking surface. It is too small to really attempt debridement. She has home health coming out to her [place] in [place] [facility] 05/18/17; there is no open area on either leg. She has significant chronic venous insufficiency. The left anterior leg wound that was still open 2 weeks ago has closed. She has home health coming out. We are going to order Juxtalite stockings for both legs. She is agreed to pay for these privately. We will order them from [facility] READMISSION 04/14/2019 This is a patient we previously had in this clinic in 2019 for 3 months. She had a wound on the left anterior greater than right anterior leg. According to my notes we discharged her and juxta lite stockings although it does not appear that she ever got them. The history here is a bit difficult. She apparently has had a wound on the left lower leg for the last 6 months. She required admission to hospital in [facility] for apparent cellulitis just before Christmas was discharged home she has an open wound. She has a home health agency although we are not exactly sure which one. They are applying silver alginate. No compression Past medical history is reviewed she has chronic venous insufficiency and a history of left breast CA. 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 She did not have any compression on the wounds today. 2/8; substantial wound on the right posterior calf and a smaller area on the left medial calf. We are using Iodoflex to both wound areas under compression. We have better edema control We to find a new wound on the left anterior medial calf 2/15; the area on the left medial calf is closed over. Substantial area on the right posterior and lateral calf. We have been using Iodoflex although the patient complains of pain  and wonders if there is not an alternative. She has home health changing the dressing twice a week and she sees [place] once 2/22; the wound on the left medial calf is closed over. Her edema control is not good here but she cannot get on stockings. This substantial wound is on the right lateral and right posterior calf. Changed her to a hydrofera Blue last week. Gradual improvement in wound area. 3/8; wound on the left leg remains closed. The substantial wound is on the right lateral and right posterior calf. This is measuring smaller 3/22; 2-week follow-up. Substantial area on the right posterior and lateral calf measures slightly smaller. We are using Hydrofera Blue under compression 4/5; 2-week follow-up. Substantial area on the right posterior lateral calf. This measures slightly smaller. She has islands of epithelialization we have been using Hydrofera Blue under compression 4/19; 2-week follow-up. We have been using Hydrofera Blue however there is maceration around the wound. I will change to silver alginate today. She has islands of epithelialization however I did not see much evidence that things were improving here. 5/6; 2-week follow-up. I put silver alginate on this because of maceration around the wound last time. However she comes in today with a lot of very adherent debris on the wound of the right lateral leg. 5/17; 2-week follow-up. Using  Hydrofera Blue under compression. The patient has epithelialization including islands of epithelialization in the middle of fairly substantial wound area on the right posterior lateral calf 6/1; 2-week follow-up. Using Hydrofera Blue under compression. She seems to have increasing epithelialization. This is an irregular wound going from medial to lateral posteriorly in the right posterior calf. This would not be easy wound to get reproducible measurement 08/25/2019 upon evaluation today patient appears to actually be doing better in regard to the wound on  her leg at this point. We have been using Adaptic followed by [facility] this is keeping her from getting stuck. The patient is aware that this is also potentially slowing down her healing simply due to the fact that the [facility] not in direct contact with the wound bed. Nonetheless she was not happy with the way it was sticking previous which is why we switched to this according to what I can read in the note and hear from the patient and her aide with her today. 6/28; 2-week follow-up. We are following this patient for severe wound on the right lower leg secondary to chronic venous insufficiency using Hydrofera Blue under compression we are making gradual improvements. She arrives in clinic today with what looks like a contusion on the left anterior mid tibia. She says she was washing her leg and pulled some skin. However there is a fairly large wound with surrounding erythema and some swelling 7/12; 2-week follow-up. Right lower leg wound not quite as good as I thought. There is still islands of epithelialization we are using Hydrofera Blue with a contact layer I would like to see if we can get rid of the contact layer at this point. The new area from last time is a lot smaller She did not take the cephalexin I initially prescribed for the erythema edema around the left leg due to fears about diarrhea. She did tolerate doxycycline that we subsequently called in 7/26; we are doing 2-week follow-up. Wound measurements are about the same. Still the same fibrinous debris we have been using Hydrofera Blue. Change to Iodoflex today. 8/17; been using Iodoflex. Some improvement in the surface area of the large wound on the right posterior calf left anterior actually slightly larger. 100% [name] covered although it is less adherent 9/seven 3-week follow-up. Been using Iodoflex. We still have continued improvement in the surface area of the large wound on the right calf. The small area on the  left anteriorly still is not closed. 01/14/2020 on evaluation today patient appears to be doing decently well in regard to her leg ulcer. Fortunately there is no signs of active infection at this time. With that being said the wound on the right leg laterally is still open and given her trouble. Fortunately there is no signs of active infection at this time. No fevers, chills, nausea, vomiting, or diarrhea. 11/18; I have not seen this woman in quite some time. She told me she had Covid in September. She has chronic venous insufficiency with lymphedema and initially had wounds on her right lateral and left anterior lower legs. The left anterior leg is closed she is wearing a stocking 02/11/2020 upon evaluation today patient appears to be doing well with regard to her leg ulcer. This appears to be somewhat dry which is good although I think the Iodoflex is probably keeping this much too dry at this point. I think it may be time to switch to something different since it has accomplish the goal and we  were kind of looking for. Fortunately there is no signs of active infection at this time. 02/25/2020 on evaluation today patient appears to be doing well with regard to her wound. She has been tolerating the dressing changes without complication. Fortunately there is no signs of active infection at this time. No fevers, chills, nausea, vomiting, or diarrhea. 03/17/2020 upon evaluation today patient's wound on the right leg seems to be doing quite well. I am very pleased with where things stand today. There does not appear to be any evidence of infection and overall I think she is managing quite nicely at this point. She is pleased. With that being said she does have some swelling of the left leg which is quite significant and again she has not really been checked for a possibility of a blood clot I think we may need to check a venous Doppler study. 03/24/2020 upon evaluation today patient appears to be doing  quite well with regard to her wounds currently. Fortunately there is no signs of active infection at this time. No fever or chills noted. With that being said she still has a lot of edema of the left leg the DVT study was negative which is great news. I am very pleased in that regard. With that being said I do believe that the patient likely needs compression stockings in this area and to make sure that she keeps edema to control so she does not end up with issues. As well. Fortunately there is no signs of active infection at this time. 04/07/2020 on evaluation today patient appears to be doing okay in regard to her wound on the right lateral leg although the wrap hospital by the nurse yesterday apparently is not doing as well as we would like to see. It caused some kind of injury outside of exactly where the wound is and this again is new and unfortunate. With that being said there does not appear to be any signs of active infection which is great news. No fever chills noted 04/14/2020 upon evaluation today patient appears to be doing decently well in regard to her wounds. She has been tolerating the dressing changes without complication. Fortunately there does not appear to be any evidence of active infection at this time. No fevers, chills, nausea, vomiting, or diarrhea. She did well with the wraps staying on for the week and they state up no problems. With that being said she tells me that she just cannot come in every week here to the clinic. For that reason she wants [place] to get the home health nurses to come back out to see her. I can definitely do that but I am not going to continue to allow them to come out to see her if the wrap is not done appropriately. For that reason I do want her to make sure that it is done correctly. If it is not then we can need to just bring her in here weekly to make sure it is until we get this wound healed. 05/05/2020 upon evaluation today patient actually appears to be  completely healed which is great news. Overall I am extremely pleased with where things stand today. I see no signs of active infection at this point. 06/09/2020 patient presents for reevaluation here in the clinic she was actually seen last February 23 of this year. That is when she discharged this healed. Nonetheless based on what I am seeing right now it appears that she has cellulitis of the right lower extremity  left lower extremity actually appears to be doing quite well and I think the compression wraps are doing a good job for her. There is no signs of active infection on this side. 4/4; patient patient returns to clinic. Most of the wounds in the right leg are completely healed and/or epithelialized however some of them look vulnerable. She still has erythema limited to the distal part of her foot but there is no tenderness here. She is not a diabetic. She is completing her antibiotics. Edema control in the right leg is good 4/11; Patient presents today with increased redness and swelling to the distal portion of her right foot. Per nursing staff this is worsened compared to last clinic visit. Patient states she is taking Bactrim however not tolerating it well and is not taking the medication at times. She denies pain currently. The wrap on the foot had rolled up over the past week. She currently denies fever/chills, nausea/vomiting or drainage from her wounds. She states that the wounds to her lower extremity have gotten smaller. Electronic Signature(s) Signed: 06/21/2020 4:22:52 PM By: Kalman Shan DO Signed: 06/21/2020 4:45:43 PM By: Linton Ham MD Entered By: Kalman Shan on 06/21/2020 15:06:11 -------------------------------------------------------------------------------- Physical Exam Details Patient Name: Date of Service: Erin [name] [name] 06/21/2020 1:15 PM Medical Record Number: [id number] Patient Account Number: [id number] Date of Birth/Sex: Treating  RN: 10-Jun-1930 (87 y.o. [name] Primary Care Provider: [name] Other Clinician: Referring Provider: Treating Provider/Extender: Volney American in Treatment: 21 Constitutional respirations regular, non-labored and within target range for patient.. Cardiovascular 2+ dorsalis pedis/posterior tibialis pulses. Gastrointestinal (GI) soft, non-tender, non-distended, +BS. Notes 4/11; Right lower extremity: There are small punctuated looking wounds that are almost closed. She has increased swelling to her ankle. The distal portion of her foot is very warm to the touch with increased swelling. Electronic Signature(s) Signed: 06/21/2020 4:22:52 PM By: Kalman Shan DO Signed: 06/21/2020 4:45:43 PM By: Linton Ham MD Entered By: Kalman Shan on 06/21/2020 15:08:21 -------------------------------------------------------------------------------- Physician Orders Details Patient Name: Date of Service: Erin [name] [name] 06/21/2020 1:15 PM Medical Record Number: [id number] Patient Account Number: [id number] Date of Birth/Sex: Treating RN: 28-Sep-1930 (87 y.o. [name] Primary Care Provider: [name] Other Clinician: Referring Provider: Treating Provider/Extender: Almira Bar in Treatment: 48 Verbal / Phone Orders: No Diagnosis Coding ICD-10 Coding Code Description L03.115 Cellulitis of right lower limb I87.331 Chronic venous hypertension (idiopathic) with ulcer and inflammation of right lower extremity L97.812 Non-pressure chronic ulcer of other part of right lower leg with fat layer exposed I89.0 Lymphedema, not elsewhere classified Follow-up Appointments ppointment in 1 week. - with Dr. Heber Superior Return A Nurse Visit: Raynelle Dick or Friday Bathing/ Shower/ Hygiene May shower with protection but do not get wound dressing(s) wet. - use cast protector to keep dry in the shower Edema Control - Lymphedema / SCD  / Other Bilateral Lower Extremities Elevate legs to the level of the heart or above for 30 minutes daily and/or when sitting, a frequency of: - throughout the day Avoid standing for long periods of time. Exercise regularly Compression stocking or Garment 20-30 mm/Hg pressure to: - farrow wrap to left leg daily, apply in the morning and remove at night Non Wound Condition Other Non Wound Condition Orders/Instructions: - go to emergency room for fever, nausea/vomiting, or increased pain in right leg Wound Treatment Wound #10 - Lower Leg Wound Laterality: Right, Medial Peri-Wound Care: Triamcinolone 15 (g) 1  x Per Week Discharge Instructions: Use triamcinolone 15 (g) mix with lotion to lower leg Peri-Wound Care: Sween Lotion (Moisturizing lotion) 1 x Per Week Discharge Instructions: Apply moisturizing lotion as directed Prim Dressing: KerraCel Ag Gelling Fiber Dressing, 2x2 in (silver alginate) 1 x Per Week ary Discharge Instructions: Apply silver alginate to any weeping areas Secondary Dressing: ABD Pad, 8x10 1 x Per Week Discharge Instructions: Apply over primary dressing as directed. Compression Wrap: Kerlix Roll 4.5x3.1 (in/yd) 1 x Per Week Discharge Instructions: Apply Kerlix and Coban compression as directed. Compression Wrap: Coban Self-Adherent Wrap 4x5 (in/yd) 1 x Per Week Discharge Instructions: Apply over Kerlix as directed. Patient Medications llergies: No Known Allergies A Notifications Medication Indication Start End Keflex DOSE 0 - oral 500 mg capsule - BID for 7 days Electronic Signature(s) Signed: 06/21/2020 3:42:19 PM By: Kalman Shan DO Entered By: Kalman Shan on 06/21/2020 15:42:19 -------------------------------------------------------------------------------- Problem List Details Patient Name: Date of Service: Erin [name] [name] 06/21/2020 1:15 PM Medical Record Number: [id number] Patient Account Number: [id number] Date of Birth/Sex: Treating  RN: October 07, 1930 (87 y.o. [name] Primary Care Provider: [name] Other Clinician: Referring Provider: Treating Provider/Extender: Almira Bar in Treatment: 62 Active Problems ICD-10 Encounter Code Description Active Date MDM Diagnosis L03.115 Cellulitis of right lower limb 06/21/2020 No Yes I87.331 Chronic venous hypertension (idiopathic) with ulcer and inflammation of right 04/14/2019 No Yes lower extremity L97.812 Non-pressure chronic ulcer of other part of right lower leg with fat layer 04/14/2019 No Yes exposed I89.0 Lymphedema, not elsewhere classified 04/14/2019 No Yes Inactive Problems ICD-10 Code Description Active Date Inactive Date L97.822 Non-pressure chronic ulcer of other part of left lower leg with fat layer exposed 09/08/2019 09/08/2019 L97.821 Non-pressure chronic ulcer of other part of left lower leg limited to breakdown of skin 04/21/2019 04/21/2019 L03.116 Cellulitis of left lower limb 09/08/2019 09/08/2019 Resolved Problems Electronic Signature(s) Signed: 06/21/2020 4:22:52 PM By: Kalman Shan DO Entered By: Kalman Shan on 06/21/2020 15:00:42 -------------------------------------------------------------------------------- Progress Note Details Patient Name: Date of Service: Erin [name] [name] 06/21/2020 1:15 PM Medical Record Number: [id number] Patient Account Number: [id number] Date of Birth/Sex: Treating RN: [birth year] (87 y.o. [name] Primary Care Provider: [name] Other Clinician: Referring Provider: Treating Provider/Extender: Almira Bar in Treatment: 37 Subjective Chief Complaint Information obtained from Patient 03/22/17; patient is here for review of wounds on her bilateral legs 4/11; right lower extremity swelling and erythema, small wounds to the medial lower right leg History of Present Illness (HPI) We to find a new wound on the left 03/22/17; this is an 87 year old  woman. There is not a lot of information in care everywhere on this patient. She had a history of a malignant neoplasm her left breast for which she had lumpectomy but she did not have radiation. Apparently she has had long-standing edema and her lower legs and she was seen in the wound care center at [facility] in [place] by [name] October. Both her legs were wrapped however it sounds as though the left leg became secondarily infected she apparently had MRSA and she was admitted the hospital. Not really turn for wound care. She lives at home on her own and has apparently [facility] home health care. We are not exactly clear what [facility] is doing to her legs but it does not involve compression. She is not a diabetic. ABIs in our clinic were noncompressible bilaterally 03/30/17; this is a patient who has bilateral  chronic venous insufficiency, severe venous inflammation. We had a nice description number legs from a note by [name] dating 2010 describing edematous warm legs with erythema. This suggests that she has chronic venous insufficiency with inflammation.. We have arterial studies dated 12/29/16 again from [facility]. This showed a right ABI at 1 and a TBI of 0.81 on the left the ABI was 1.25 and a TBI of 0.72. This was just she does not have significant arterial insufficiency. She had normal triphasic waveforms noted that she was admitted to hospital in late October with MSSA cellulitis Last visit we wrapped both her legs. She had an open area predominantly on the left anterior but weeping edema on the right anterior leg. She has done well there is only one at anterior left tibial wound 04/06/17; the patient has no open area on her right leg. Her remaining wound is on the left anterior leg, the left posterior leg wound is healed. We have been using silver alginate changed to [facility] on the left leg today she has home health 04/13/17; still no open area on her right leg.  Her remaining wound on the left anterior leg. We have been using using Hydrofera Blue 04/20/17; no open area on the right leg although the edema here is somewhat disfiguring. Her remaining wound is on the left anterior leg we've been using Hydrofera Blue with improvement in dimensions. She has [facility] home health and lives in [place]. She requests to come every 2 weeks because of the distance involved in coming here 05/04/17; there is no open area on the right leg. She has a very small left anterior leg wound that remains. However what remains on the left still has some depth and a not to viable looking surface. It is too small to really attempt debridement. She has home health coming out to her home in [facility] 05/18/17; there is no open area on either leg. She has significant chronic venous insufficiency. The left anterior leg wound that was still open 2 weeks ago has closed. She has home health coming out. We are going to order Juxtalite stockings for both legs. She is agreed to pay for these privately. We will order them from [facility] READMISSION 04/14/2019 This is a patient we previously had in this clinic in 2019 for 3 months. She had a wound on the left anterior greater than right anterior leg. According to my notes we discharged her and juxta lite stockings although it does not appear that she ever got them. The history here is a bit difficult. She apparently has had a wound on the left lower leg for the last 6 months. She required admission to hospital in [facility] for apparent cellulitis just before Christmas was discharged home she has an open wound. She has a home health agency although we are not exactly sure which one. They are applying silver alginate. No compression Past medical history is reviewed she has chronic venous insufficiency and a history of left breast CA. 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 She did not have any compression on the wounds today. 2/8; substantial wound on the right posterior calf and a smaller area on the left medial calf. We are using Iodoflex to both wound areas under compression. We have better edema control We to find a new wound on the left  anterior medial calf 2/15; the area on the left medial calf is closed over. Substantial area on the right posterior and lateral calf. We have been using Iodoflex although the patient complains of pain and wonders if there is not an alternative. She has home health changing the dressing twice a week and she sees [place] once 2/22; the wound on the left medial calf is closed over. Her edema control is not good here but she cannot get on stockings. This substantial wound is on the right lateral and right posterior calf. Changed her to a hydrofera Blue last week. Gradual improvement in wound area. 3/8; wound on the left leg remains closed. The substantial wound is on the right lateral and right posterior calf. This is measuring smaller 3/22; 2-week follow-up. Substantial area on the right posterior and lateral calf measures slightly smaller. We are using Hydrofera Blue under compression 4/5; 2-week follow-up. Substantial area on the right posterior lateral calf. This measures slightly smaller. She has islands of epithelialization we have been using Hydrofera Blue under compression 4/19; 2-week follow-up. We have been using Hydrofera Blue however there is maceration around the wound. I will change to silver alginate today. She has islands of epithelialization however I did not see much evidence that things were improving here. 5/6; 2-week follow-up. I put silver alginate on this because of maceration around the wound last time. However she comes in today with a lot of very adherent debris on the wound of the right lateral leg. 5/17; 2-week follow-up. Using Hydrofera Blue under  compression. The patient has epithelialization including islands of epithelialization in the middle of fairly substantial wound area on the right posterior lateral calf 6/1; 2-week follow-up. Using Hydrofera Blue under compression. She seems to have increasing epithelialization. This is an irregular wound going from medial to lateral posteriorly in the right posterior calf. This would not be easy wound to get reproducible measurement 08/25/2019 upon evaluation today patient appears to actually be doing better in regard to the wound on her leg at this point. We have been using Adaptic followed by [facility] this is keeping her from getting stuck. The patient is aware that this is also potentially slowing down her healing simply due to the fact that the [facility] not in direct contact with the wound bed. Nonetheless she was not happy with the way it was sticking previous which is why we switched to this according to what I can read in the note and hear from the patient and her aide with her today. 6/28; 2-week follow-up. We are following this patient for severe wound on the right lower leg secondary to chronic venous insufficiency using Hydrofera Blue under compression we are making gradual improvements. She arrives in clinic today with what looks like a contusion on the left anterior mid tibia. She says she was washing her leg and pulled some skin. However there is a fairly large wound with surrounding erythema and some swelling 7/12; 2-week follow-up. Right lower leg wound not quite as good as I thought. There is still islands of epithelialization we are using Hydrofera Blue with a contact layer I would like to see if we can get rid of the contact layer at this point. ooThe new area from last time is a lot smaller ooShe did not take the cephalexin I initially prescribed for the erythema edema around the left leg due to fears about diarrhea. She did tolerate doxycycline that we subsequently  called in 7/26; we are doing 2-week  follow-up. Wound measurements are about the same. Still the same fibrinous debris we have been using Hydrofera Blue. Change to Iodoflex today. 8/17; been using Iodoflex. Some improvement in the surface area of the large wound on the right posterior calf left anterior actually slightly larger. 100% [name] covered although it is less adherent 9/seven 3-week follow-up. Been using Iodoflex. We still have continued improvement in the surface area of the large wound on the right calf. The small area on the left anteriorly still is not closed. 01/14/2020 on evaluation today patient appears to be doing decently well in regard to her leg ulcer. Fortunately there is no signs of active infection at this time. With that being said the wound on the right leg laterally is still open and given her trouble. Fortunately there is no signs of active infection at this time. No fevers, chills, nausea, vomiting, or diarrhea. 11/18; I have not seen this woman in quite some time. She told me she had Covid in September. She has chronic venous insufficiency with lymphedema and initially had wounds on her right lateral and left anterior lower legs. The left anterior leg is closed she is wearing a stocking 02/11/2020 upon evaluation today patient appears to be doing well with regard to her leg ulcer. This appears to be somewhat dry which is good although I think the Iodoflex is probably keeping this much too dry at this point. I think it may be time to switch to something different since it has accomplish the goal and we were kind of looking for. Fortunately there is no signs of active infection at this time. 02/25/2020 on evaluation today patient appears to be doing well with regard to her wound. She has been tolerating the dressing changes without complication. Fortunately there is no signs of active infection at this time. No fevers, chills, nausea, vomiting, or diarrhea. 03/17/2020 upon  evaluation today patient's wound on the right leg seems to be doing quite well. I am very pleased with where things stand today. There does not appear to be any evidence of infection and overall I think she is managing quite nicely at this point. She is pleased. With that being said she does have some swelling of the left leg which is quite significant and again she has not really been checked for a possibility of a blood clot I think we may need to check a venous Doppler study. 03/24/2020 upon evaluation today patient appears to be doing quite well with regard to her wounds currently. Fortunately there is no signs of active infection at this time. No fever or chills noted. With that being said she still has a lot of edema of the left leg the DVT study was negative which is great news. I am very pleased in that regard. With that being said I do believe that the patient likely needs compression stockings in this area and to make sure that she keeps edema to control so she does not end up with issues. As well. Fortunately there is no signs of active infection at this time. 04/07/2020 on evaluation today patient appears to be doing okay in regard to her wound on the right lateral leg although the wrap hospital by the nurse yesterday apparently is not doing as well as we would like to see. It caused some kind of injury outside of exactly where the wound is and this again is new and unfortunate. With that being said there does not appear to be any signs of active infection  which is great news. No fever chills noted 04/14/2020 upon evaluation today patient appears to be doing decently well in regard to her wounds. She has been tolerating the dressing changes without complication. Fortunately there does not appear to be any evidence of active infection at this time. No fevers, chills, nausea, vomiting, or diarrhea. She did well with the wraps staying on for the week and they state up no problems. With that being  said she tells me that she just cannot come in every week here to the clinic. For that reason she wants [place] to get the home health nurses to come back out to see her. I can definitely do that but I am not going to continue to allow them to come out to see her if the wrap is not done appropriately. For that reason I do want her to make sure that it is done correctly. If it is not then we can need to just bring her in here weekly to make sure it is until we get this wound healed. 05/05/2020 upon evaluation today patient actually appears to be completely healed which is great news. Overall I am extremely pleased with where things stand today. I see no signs of active infection at this point. 06/09/2020 patient presents for reevaluation here in the clinic she was actually seen last February 23 of this year. That is when she discharged this healed. Nonetheless based on what I am seeing right now it appears that she has cellulitis of the right lower extremity left lower extremity actually appears to be doing quite well and I think the compression wraps are doing a good job for her. There is no signs of active infection on this side. 4/4; patient patient returns to clinic. Most of the wounds in the right leg are completely healed and/or epithelialized however some of them look vulnerable. She still has erythema limited to the distal part of her foot but there is no tenderness here. She is not a diabetic. She is completing her antibiotics. Edema control in the right leg is good 4/11; Patient presents today with increased redness and swelling to the distal portion of her right foot. Per nursing staff this is worsened compared to last clinic visit. Patient states she is taking Bactrim however not tolerating it well and is not taking the medication at times. She denies pain currently. The wrap on the foot had rolled up over the past week. She currently denies fever/chills, nausea/vomiting or drainage from her  wounds. She states that the wounds to her lower extremity have gotten smaller. Patient History Information obtained from Patient. Family History Cancer - Mother, Diabetes - Mother, Heart Disease - Father, Hypertension - Father, Stroke - Mother, No family history of Hereditary Spherocytosis, Kidney Disease, Lung Disease, Seizures, Thyroid Problems, Tuberculosis. Social History Never smoker, Marital Status - Single, Alcohol Use - Never, Drug Use - No History, Caffeine Use - Daily. Medical History Eyes Patient has history of Cataracts - removed on left, tiny on right currently, Glaucoma Denies history of Optic Neuritis Ear/Nose/Mouth/Throat Patient has history of Middle ear problems Denies history of Chronic sinus problems/congestion Hematologic/Lymphatic Patient has history of Anemia - hysterectomy in 1974--fine since Denies history of Hemophilia, Human Immunodeficiency Virus, Lymphedema, Sickle Cell Disease Respiratory Denies history of Aspiration, Asthma, Chronic Obstructive Pulmonary Disease (COPD), Pneumothorax, Sleep Apnea, Tuberculosis Cardiovascular Patient has history of Hypertension - Lasix Denies history of Angina, Arrhythmia, Congestive Heart Failure, Coronary Artery Disease, Deep Vein Thrombosis, Hypotension, Myocardial Infarction, Peripheral Arterial  Disease, Peripheral Venous Disease, Phlebitis, Vasculitis Gastrointestinal Denies history of Cirrhosis , Colitis, Crohnoos, Hepatitis A, Hepatitis B, Hepatitis C Endocrine Denies history of Type I Diabetes, Type II Diabetes Immunological Denies history of Lupus Erythematosus, Raynaudoos, Scleroderma Integumentary (Skin) Denies history of History of Burn Musculoskeletal Denies history of Gout, Rheumatoid Arthritis, Osteoarthritis, Osteomyelitis Neurologic Denies history of Dementia, Neuropathy, Quadriplegia, Paraplegia, Seizure Disorder Oncologic Denies history of Received Chemotherapy, Received  Radiation Psychiatric Denies history of Anorexia/bulimia, Confinement Anxiety Hospitalization/Surgery History - Abdominal Hysterectomy. - Left Breast Lumpectomy with Radioactive Seed Localization. - Breast Surgery. - Lumbar Spinal Stenosis. - Left Re-Excision of Breast Cancer Superior Margins. Medical A Surgical History Notes nd Ear/Nose/Mouth/Throat congenital nerve deafness Genitourinary Cystitis Oncologic Malignant neoplasm of upper-inner quadrant of left breast in female, estrogen receptor positive Objective Constitutional respirations regular, non-labored and within target range for patient.. Vitals Time Taken: 1:33 PM, Height: 60 in, Weight: 130 lbs, BMI: 25.4, Temperature: 98.4 F, Pulse: 82 bpm, Respiratory Rate: 17 breaths/min, Blood Pressure: 182/70 mmHg. Cardiovascular 2+ dorsalis pedis/posterior tibialis pulses. Gastrointestinal (GI) soft, non-tender, non-distended, +BS. General Notes: 4/11; Right lower extremity: There are small punctuated looking wounds that are almost closed. She has increased swelling to her ankle. The distal portion of her foot is very warm to the touch with increased swelling. Integumentary (Hair, Skin) Wound #10 status is Open. Original cause of wound was Gradually Appeared. The date acquired was: 06/02/2020. The wound has been in treatment 1 weeks. The wound is located on the Right,Medial Lower Leg. The wound measures 0.3cm length x 0.4cm width x 0.1cm depth; 0.094cm^2 area and 0.009cm^3 volume. There is Fat Layer (Subcutaneous Tissue) exposed. There is no tunneling or undermining noted. There is a medium amount of serosanguineous drainage noted. The wound margin is distinct with the outline attached to the wound base. There is large (67-100%) red, pink, pale granulation within the wound bed. There is a small (1-33%) amount of necrotic tissue within the wound bed. Assessment Active Problems ICD-10 Cellulitis of right lower limb Chronic venous  hypertension (idiopathic) with ulcer and inflammation of right lower extremity Non-pressure chronic ulcer of other part of right lower leg with fat layer exposed Lymphedema, not elsewhere classified Patient presents with increased warmth and erythema to her right lower extremity concerning for cellulitis. This may just be be localized edema from her wraps rolling up however I would like to change her antibiotics from bactrim to Keflex to see if this helps improve her symptoms. I will change the 3 layer wrap to Coban and Kerlix and she will have a nurse visit at the end of the week to follow this up. There are a few small wounds crusted over along the medial side of her right leg. We will continue to use silver alginate on the areas. Plan Follow-up Appointments: Return Appointment in 1 week. - with Dr. Heber [place] Nurse Visit: Raynelle Dick or Friday Bathing/ Shower/ Hygiene: May shower with protection but do not get wound dressing(s) wet. - use cast protector to keep dry in the shower Edema Control - Lymphedema / SCD / Other: Elevate legs to the level of the heart or above for 30 minutes daily and/or when sitting, a frequency of: - throughout the day Avoid standing for long periods of time. Exercise regularly Compression stocking or Garment 20-30 mm/Hg pressure to: - farrow wrap to left leg daily, apply in the morning and remove at night Non Wound Condition: Other Non Wound Condition Orders/Instructions: - go to emergency room for fever, nausea/vomiting,  or increased pain in right leg The following medication(s) was prescribed: Keflex oral 500 mg capsule 0 BID for 7 days WOUND #10: - Lower Leg Wound Laterality: Right, Medial Peri-Wound Care: Triamcinolone 15 (g) 1 x Per Week/ Discharge Instructions: Use triamcinolone 15 (g) mix with lotion to lower leg Peri-Wound Care: Sween Lotion (Moisturizing lotion) 1 x Per Week/ Discharge Instructions: Apply moisturizing lotion as directed Prim Dressing:  KerraCel Ag Gelling Fiber Dressing, 2x2 in (silver alginate) 1 x Per Week/ ary Discharge Instructions: Apply silver alginate to any weeping areas Secondary Dressing: ABD Pad, 8x10 1 x Per Week/ Discharge Instructions: Apply over primary dressing as directed. Com pression Wrap: Kerlix Roll 4.5x3.1 (in/yd) 1 x Per Week/ Discharge Instructions: Apply Kerlix and Coban compression as directed. Com pression Wrap: Coban Self-Adherent Wrap 4x5 (in/yd) 1 x Per Week/ Discharge Instructions: Apply over Kerlix as directed. 1. Keflex for 7 days 2. Kerlix and Coban wrap 3. Silver alginate to small open wounds 4. Nurse visit by the end of the week and appointment with me next week Electronic Signature(s) Signed: 06/21/2020 4:22:52 PM By: Kalman Shan DO Entered By: Kalman Shan on 06/21/2020 15:42:48 -------------------------------------------------------------------------------- HxROS Details Patient Name: Date of Service: Erin [name], CHA [name] 06/21/2020 1:15 PM Medical Record Number: [id number] Patient Account Number: [id number] Date of Birth/Sex: Treating RN: 01-Apr-1930 (87 y.o. [name] Primary Care Provider: [name] Other Clinician: Referring Provider: Treating Provider/Extender: Almira Bar in Treatment: 97 Information Obtained From Patient Eyes Medical History: Positive for: Cataracts - removed on left, tiny on right currently; Glaucoma Negative for: Optic Neuritis Ear/Nose/Mouth/Throat Medical History: Positive for: Middle ear problems Negative for: Chronic sinus problems/congestion Past Medical History Notes: congenital nerve deafness Hematologic/Lymphatic Medical History: Positive for: Anemia - hysterectomy in 1974--fine since Negative for: Hemophilia; Human Immunodeficiency Virus; Lymphedema; Sickle Cell Disease Respiratory Medical History: Negative for: Aspiration; Asthma; Chronic Obstructive Pulmonary Disease (COPD);  Pneumothorax; Sleep Apnea; Tuberculosis Cardiovascular Medical History: Positive for: Hypertension - Lasix Negative for: Angina; Arrhythmia; Congestive Heart Failure; Coronary Artery Disease; Deep Vein Thrombosis; Hypotension; Myocardial Infarction; Peripheral Arterial Disease; Peripheral Venous Disease; Phlebitis; Vasculitis Gastrointestinal Medical History: Negative for: Cirrhosis ; Colitis; Crohns; Hepatitis A; Hepatitis B; Hepatitis C Endocrine Medical History: Negative for: Type I Diabetes; Type II Diabetes Genitourinary Medical History: Past Medical History Notes: Cystitis Immunological Medical History: Negative for: Lupus Erythematosus; Raynauds; Scleroderma Integumentary (Skin) Medical History: Negative for: History of Burn Musculoskeletal Medical History: Negative for: Gout; Rheumatoid Arthritis; Osteoarthritis; Osteomyelitis Neurologic Medical History: Negative for: Dementia; Neuropathy; Quadriplegia; Paraplegia; Seizure Disorder Oncologic Medical History: Negative for: Received Chemotherapy; Received Radiation Past Medical History Notes: Malignant neoplasm of upper-inner quadrant of left breast in female, estrogen receptor positive Psychiatric Medical History: Negative for: Anorexia/bulimia; Confinement Anxiety HBO Extended History Items Eyes: Eyes: Ear/Nose/Mouth/Throat: Cataracts Glaucoma Middle ear problems Immunizations Pneumococcal Vaccine: Received Pneumococcal Vaccination: No Implantable Devices No devices added Hospitalization / Surgery History Type of Hospitalization/Surgery Abdominal Hysterectomy Left Breast Lumpectomy with Radioactive Seed Localization Breast Surgery Lumbar Spinal Stenosis Left Re-Excision of Breast Cancer Superior Margins Family and Social History Cancer: Yes - Mother; Diabetes: Yes - Mother; Heart Disease: Yes - Father; Hereditary Spherocytosis: No; Hypertension: Yes - Father; Kidney Disease: No; Lung Disease: No;  Seizures: No; Stroke: Yes - Mother; Thyroid Problems: No; Tuberculosis: No; Never smoker; Marital Status - Single; Alcohol Use: Never; Drug Use: No History; Caffeine Use: Daily; Financial Concerns: No; Food, Clothing or Shelter Needs: No; Support System Lacking: No; Transportation Concerns: No  Electronic Signature(s) Signed: 06/21/2020 4:22:52 PM By: Kalman Shan DO Signed: 06/21/2020 5:30:45 PM By: [name] RN, BSN Entered By: Kalman Shan on 06/21/2020 15:06:23 -------------------------------------------------------------------------------- [place] Details Patient Name: Date of Service: Erin [name], CHA [name] 06/21/2020 Medical Record Number: [id number] Patient Account Number: [id number] Date of Birth/Sex: Treating RN: Jan 28, 1931 (87 y.o. [name] Primary Care Provider: [name] Other Clinician: Referring Provider: Treating Provider/Extender: Almira Bar in Treatment: 62 Diagnosis Coding ICD-10 Codes Code Description L03.115 Cellulitis of right lower limb I87.331 Chronic venous hypertension (idiopathic) with ulcer and inflammation of right lower extremity L97.812 Non-pressure chronic ulcer of other part of right lower leg with fat layer exposed I89.0 Lymphedema, not elsewhere classified Facility Procedures CPT4 Code: [id number] Description: (Facility Use Only) [facility] RT LEG Modifier: Quantity: 1 Electronic Signature(s) Signed: 06/21/2020 5:30:45 PM By: [name] RN, BSN Previous Signature: 06/21/2020 4:22:52 PM Version By: Kalman Shan DO Entered By: [name] on 06/21/2020 17:12:05

## 2020-06-23 NOTE — Progress Notes (Signed)
SHELA, ESSES ([id number]) Visit Report for 06/21/2020 Arrival Information Details Patient Name: Date of Service: Erin [name] [name] 06/21/2020 1:15 PM Medical Record Number: [id number] Patient Account Number: [id number] Date of Birth/Sex: Treating RN: 01/25/1931 (87 y.o. [name] Primary Care Dorcas Melito: [name] Other Clinician: Referring Krissa Utke: Treating Darrall Strey/Extender: Almira Bar in Treatment: 83 Visit Information History Since Last Visit Added or deleted any medications: No Patient Arrived: Wheel Chair Any new allergies or adverse reactions: No Arrival Time: 13:32 Had a fall or experienced change in No Accompanied By: caregiver activities of daily living that may affect Transfer Assistance: None risk of falls: Patient Identification Verified: Yes Signs or symptoms of abuse/neglect since last visito No Secondary Verification Process Completed: Yes Hospitalized since last visit: No Patient Requires Transmission-Based Precautions: No Implantable device outside of the clinic excluding No Patient Has Alerts: Yes cellular tissue based products placed in the center Patient Alerts: NON COMPRESSABLE since last visit: Has Dressing in Place as Prescribed: Yes Pain Present Now: No Notes right foot toes are red and edematous. Per patient stopped taking oral antibiotics Bactrim. MD and case manager. Electronic Signature(s) Signed: 06/21/2020 5:30:45 PM By: [name] RN, BSN Entered By: [name] on 06/21/2020 14:29:00 -------------------------------------------------------------------------------- Encounter Discharge Information Details Patient Name: Date of Service: Erin [name] [name] 06/21/2020 1:15 PM Medical Record Number: [id number] Patient Account Number: [id number] Date of Birth/Sex: Treating RN: 07/16/1930 (87 y.o. [name] Primary Care Tiarah Shisler: [name] Other Clinician: Referring  Parish Dubose: Treating Zarina Pe/Extender: Volney American in Treatment: 73 Encounter Discharge Information Items Discharge Condition: Stable Ambulatory Status: Wheelchair Discharge Destination: Home Transportation: Private Auto Accompanied By: caregiver Schedule Follow-up Appointment: Yes Clinical Summary of Care: Electronic Signature(s) Signed: 06/21/2020 5:44:53 PM By: Deon Pilling Entered By: Deon Pilling on 06/21/2020 15:02:56 -------------------------------------------------------------------------------- Lower Extremity Assessment Details Patient Name: Date of Service: Erin [name] [name] 06/21/2020 1:15 PM Medical Record Number: [id number] Patient Account Number: [id number] Date of Birth/Sex: Treating RN: 1930/06/14 (87 y.o. [name], Tammi Klippel Primary Care Merrilee Ancona: [name] Other Clinician: Referring Benjie Ricketson: Treating Garey Alleva/Extender: Juleen Starr Weeks in Treatment: 62 Edema Assessment Assessed: [Left: No] [Right: Yes] Edema: [Left: Yes] [Right: Yes] Calf Left: Right: Point of Measurement: 35 cm From Medial Instep 38 cm Ankle Left: Right: Point of Measurement: 9 cm From Medial Instep 24 cm Vascular Assessment Pulses: Dorsalis Pedis Palpable: [Right:Yes] Electronic Signature(s) Signed: 06/21/2020 5:30:45 PM By: [name] RN, BSN Signed: 06/21/2020 5:44:53 PM By: Deon Pilling Entered By: [name] on 06/21/2020 14:29:28 -------------------------------------------------------------------------------- Multi Wound Chart Details Patient Name: Date of Service: Erin [name] [name] 06/21/2020 1:15 PM Medical Record Number: [id number] Patient Account Number: [id number] Date of Birth/Sex: Treating RN: [birth year] (87 y.o. [name] Primary Care Elridge Stemm: [name] Other Clinician: Referring Rini Moffit: Treating Christophe Rising/Extender: Almira Bar in Treatment: 54 Vital  Signs Height(in): 60 Pulse(bpm): 16 Weight(lbs): 130 Blood Pressure(mmHg): 182/70 Body Mass Index(BMI): 25 Temperature(F): 98.4 Respiratory Rate(breaths/min): 17 Photos: [10:No Photos Right, Medial Lower Leg] [N/A:N/A N/A] Wound Location: [10:Gradually Appeared] [N/A:N/A] Wounding Event: [10:Lymphedema] [N/A:N/A] Primary Etiology: [10:Venous Leg Ulcer] [N/A:N/A] Secondary Etiology: [10:Cataracts, Glaucoma, Middle ear] [N/A:N/A] Comorbid History: [10:problems, Anemia, Hypertension 06/02/2020] [N/A:N/A] Date Acquired: [10:1] [N/A:N/A] Weeks of Treatment: [10:Open] [N/A:N/A] Wound Status: [10:Yes] [N/A:N/A] Clustered Wound: [10:1] [N/A:N/A] Clustered Quantity: [10:0.3x0.4x0.1] [N/A:N/A] Measurements L x W x D (cm) [10:0.094] [N/A:N/A] A (cm) : rea [10:0.009] [N/A:N/A] Volume (cm) : [10:99.30%] [  N/A:N/A] % Reduction in Area: [10:99.40%] [N/A:N/A] % Reduction in Volume: [10:Full Thickness Without Exposed] [N/A:N/A] Classification: [10:Support Structures Medium] [N/A:N/A] Exudate Amount: [10:Serosanguineous] [N/A:N/A] Exudate Type: [10:red, brown] [N/A:N/A] Exudate Color: [10:Distinct, outline attached] [N/A:N/A] Wound Margin: [10:Large (67-100%)] [N/A:N/A] Granulation Amount: [10:Red, Pink, Pale] [N/A:N/A] Granulation Quality: [10:Small (1-33%)] [N/A:N/A] Necrotic Amount: [10:Fat Layer (Subcutaneous Tissue): Yes N/A] Exposed Structures: [10:Fascia: No Tendon: No Muscle: No Joint: No Bone: No Large (67-100%)] [N/A:N/A] Treatment Notes Electronic Signature(s) Signed: 06/21/2020 4:22:52 PM By: Kalman Shan DO Signed: 06/21/2020 5:30:45 PM By: [name] RN, BSN Entered By: Kalman Shan on 06/21/2020 15:01:05 -------------------------------------------------------------------------------- Multi-Disciplinary Care Plan Details Patient Name: Date of Service: Erin [name] [name] 06/21/2020 1:15 PM Medical Record Number: [id number] Patient Account Number:  [id number] Date of Birth/Sex: Treating RN: 08/23/1930 (87 y.o. [name] Primary Care Latanza Pfefferkorn: [name] Other Clinician: Referring Mataio Mele: Treating Aitan Rossbach/Extender: Almira Bar in Treatment: 77 Multidisciplinary Care Plan reviewed with physician Active Inactive Soft Tissue Infection Nursing Diagnoses: Impaired tissue integrity Potential for infection: soft tissue Goals: Patient's soft tissue infection will resolve Date Initiated: 06/09/2020 Target Resolution Date: 07/07/2020 Goal Status: Active Interventions: Assess signs and symptoms of infection every visit Provide education on infection Treatment Activities: Education provided on Infection : 06/09/2020 Systemic antibiotics : 06/09/2020 Notes: Venous Leg Ulcer Nursing Diagnoses: Actual venous Insuffiency (use after diagnosis is confirmed) Knowledge deficit related to disease process and management Goals: Patient will maintain optimal edema control Date Initiated: 04/14/2019 Target Resolution Date: 07/07/2020 Goal Status: Active Patient/caregiver will verbalize understanding of disease process and disease management Date Initiated: 04/14/2019 Date Inactivated: 05/19/2019 Target Resolution Date: 05/16/2019 Goal Status: Met Interventions: Assess peripheral edema status every visit. Compression as ordered Provide education on venous insufficiency Notes: Electronic Signature(s) Signed: 06/21/2020 5:30:45 PM By: [name] RN, BSN Entered By: [name] on 06/21/2020 14:29:52 -------------------------------------------------------------------------------- Pain Assessment Details Patient Name: Date of Service: Erin [name] [name] 06/21/2020 1:15 PM Medical Record Number: [id number] Patient Account Number: [id number] Date of Birth/Sex: Treating RN: August 14, 1930 (87 y.o. [name] Primary Care Willey Due: [name] Other Clinician: Referring Docia Klar: Treating  Kambra Beachem/Extender: Almira Bar in Treatment: 43 Active Problems Location of Pain Severity and Description of Pain Patient Has Paino No Site Locations Pain Management and Medication Current Pain Management: Electronic Signature(s) Signed: 06/21/2020 5:30:45 PM By: [name] RN, BSN Entered By: [name] on 06/21/2020 14:29:17 -------------------------------------------------------------------------------- Patient/Caregiver Education Details Patient Name: Date of Service: Erin [name] [name] 4/11/2022andnbsp1:15 PM Medical Record Number: [id number] Patient Account Number: [id number] Date of Birth/Gender: Treating RN: 1930-06-01 (87 y.o. [name] Primary Care Physician: [name] Other Clinician: Referring Physician: Treating Physician/Extender: Almira Bar in Treatment: 60 Education Assessment Education Provided To: Patient Education Topics Provided Venous: Methods: Explain/Verbal Responses: State content correctly Wound/Skin Impairment: Methods: Explain/Verbal Responses: State content correctly Electronic Signature(s) Signed: 06/21/2020 5:30:45 PM By: [name] RN, BSN Entered By: [name] on 06/21/2020 14:30:11 -------------------------------------------------------------------------------- Wound Assessment Details Patient Name: Date of Service: Erin [name] [name] 06/21/2020 1:15 PM Medical Record Number: [id number] Patient Account Number: [id number] Date of Birth/Sex: Treating RN: 04/02/1930 (87 y.o. [name] Primary Care Chad Tiznado: [name] Other Clinician: Referring Caison Hearn: Treating Zoe Goonan/Extender: Volney American in Treatment: 62 Wound Status Wound Number: 10 Primary Etiology: Lymphedema Wound Location: Right, Medial Lower Leg Secondary Venous Leg Ulcer Etiology: Wounding Event: Gradually Appeared Wound Status: Open Date  Acquired:  06/02/2020 Comorbid Cataracts, Glaucoma, Middle ear problems, Anemia, Weeks Of Treatment: 1 History: Hypertension Clustered Wound: Yes Photos Wound Measurements Length: (cm) 0.3 Width: (cm) 0.4 Depth: (cm) 0.1 Clustered Quantity: 1 Area: (cm) 0.094 Volume: (cm) 0.009 % Reduction in Area: 99.3% % Reduction in Volume: 99.4% Epithelialization: Large (67-100%) Tunneling: No Undermining: No Wound Description Classification: Full Thickness Without Exposed Support Structures Wound Margin: Distinct, outline attached Exudate Amount: Medium Exudate Type: Serosanguineous Exudate Color: red, brown Foul Odor After Cleansing: No [name]/Fibrino Yes Wound Bed Granulation Amount: Large (67-100%) Exposed Structure Granulation Quality: Red, Pink, Pale Fascia Exposed: No Necrotic Amount: Small (1-33%) Fat Layer (Subcutaneous Tissue) Exposed: Yes Tendon Exposed: No Muscle Exposed: No Joint Exposed: No Bone Exposed: No Treatment Notes Wound #10 (Lower Leg) Wound Laterality: Right, Medial Cleanser Peri-Wound Care Triamcinolone 15 (g) Discharge Instruction: Use triamcinolone 15 (g) mix with lotion to lower leg Sween Lotion (Moisturizing lotion) Discharge Instruction: Apply moisturizing lotion as directed Topical Primary Dressing KerraCel Ag Gelling Fiber Dressing, 2x2 in (silver alginate) Discharge Instruction: Apply silver alginate to any weeping areas Secondary Dressing ABD Pad, 8x10 Discharge Instruction: Apply over primary dressing as directed. Secured With Compression Wrap Kerlix Roll 4.5x3.1 (in/yd) Discharge Instruction: Apply Kerlix and Coban compression as directed. Coban Self-Adherent Wrap 4x5 (in/yd) Discharge Instruction: Apply over Kerlix as directed. Compression Stockings Add-Ons Electronic Signature(s) Signed: 06/21/2020 5:30:45 PM By: [name] RN, BSN Signed: 06/23/2020 8:29:36 AM By: Sandre Kitty Entered By: Sandre Kitty on 06/21/2020  16:45:29 -------------------------------------------------------------------------------- Vitals Details Patient Name: Date of Service: Erin [name] [name] 06/21/2020 1:15 PM Medical Record Number: [id number] Patient Account Number: [id number] Date of Birth/Sex: Treating RN: 1930-06-12 (87 y.o. [name] Primary Care Liley Rake: [name] Other Clinician: Referring Amalie Koran: Treating Kikue Gerhart/Extender: Almira Bar in Treatment: 7 Vital Signs Time Taken: 13:33 Temperature (F): 98.4 Height (in): 60 Pulse (bpm): 82 Weight (lbs): 130 Respiratory Rate (breaths/min): 17 Body Mass Index (BMI): 25.4 Blood Pressure (mmHg): 182/70 Reference Range: 80 - 120 mg / dl Electronic Signature(s) Signed: 06/21/2020 5:30:45 PM By: [name] RN, BSN Entered By: [name] on 06/21/2020 14:29:11

## 2020-06-24 ENCOUNTER — Encounter (HOSPITAL_BASED_OUTPATIENT_CLINIC_OR_DEPARTMENT_OTHER): Payer: Medicare HMO | Admitting: Internal Medicine

## 2020-06-24 ENCOUNTER — Other Ambulatory Visit: Payer: Self-pay

## 2020-06-24 DIAGNOSIS — I87331 Chronic venous hypertension (idiopathic) with ulcer and inflammation of right lower extremity: Secondary | ICD-10-CM | POA: Diagnosis not present

## 2020-06-24 NOTE — Progress Notes (Signed)
OSSIE, YEBRA ([id number]) Visit Report for 06/24/2020 Arrival Information Details Patient Name: Date of Service: Erin [name] [name] 06/24/2020 11:15 A M Medical Record Number: [id number] Patient Account Number: [id number] Date of Birth/Sex: Treating RN: 1931-01-16 (87 y.o. Erin [name] Primary Care Erin [name]: [name] Other Clinician: Referring Aydon Swamy: Treating Damaris Geers/Extender: Volney American in Treatment: 18 Visit Information History Since Last Visit Added or deleted any medications: No Patient Arrived: Wheel Chair Any new allergies or adverse reactions: No Arrival Time: 11:16 Had a fall or experienced change in No Accompanied By: friend activities of daily living that may affect Transfer Assistance: None risk of falls: Patient Identification Verified: Yes Signs or symptoms of abuse/neglect since last visito No Secondary Verification Process Completed: Yes Hospitalized since last visit: No Patient Requires Transmission-Based Precautions: No Implantable device outside of the clinic excluding No Patient Has Alerts: Yes cellular tissue based products placed in the center Patient Alerts: NON COMPRESSABLE since last visit: Has Dressing in Place as Prescribed: Yes Has Compression in Place as Prescribed: Yes Pain Present Now: No Electronic Signature(s) Signed: 06/24/2020 6:17:09 PM By: [name] Hurst RN, BSN Entered By: [name] Hurst on 06/24/2020 11:47:36 -------------------------------------------------------------------------------- Clinic Level of Care Assessment Details Patient Name: Date of Service: Erin [name] [name] 06/24/2020 11:15 A M Medical Record Number: [id number] Patient Account Number: [id number] Date of Birth/Sex: Treating RN: 23-Nov-1930 (87 y.o. Erin [name] Primary Care Karsyn Rochin: [name] Other Clinician: Referring Jandiel Magallanes: Treating Benedicta Sultan/Extender: Volney American in  Treatment: 62 Clinic Level of Care Assessment Items TOOL 4 Quantity Score X- 1 0 Use when only an EandM is performed on FOLLOW-UP visit ASSESSMENTS - Nursing Assessment / Reassessment X- 1 10 Reassessment of Co-morbidities (includes updates in patient status) X- 1 5 Reassessment of Adherence to Treatment Plan ASSESSMENTS - Wound and Skin A ssessment / Reassessment X - Simple Wound Assessment / Reassessment - one wound 1 5 []  - 0 Complex Wound Assessment / Reassessment - multiple wounds []  - 0 Dermatologic / Skin Assessment (not related to wound area) ASSESSMENTS - Focused Assessment []  - 0 Circumferential Edema Measurements - multi extremities []  - 0 Nutritional Assessment / Counseling / Intervention []  - 0 Lower Extremity Assessment (monofilament, tuning fork, pulses) []  - 0 Peripheral Arterial Disease Assessment (using hand held doppler) ASSESSMENTS - Ostomy and/or Continence Assessment and Care []  - 0 Incontinence Assessment and Management []  - 0 Ostomy Care Assessment and Management (repouching, etc.) PROCESS - Coordination of Care X - Simple Patient / Family Education for ongoing care 1 15 []  - 0 Complex (extensive) Patient / Family Education for ongoing care X- 1 10 Staff obtains Programmer, systems, Records, T Results / Process Orders est []  - 0 Staff telephones HHA, Nursing Homes / Clarify orders / etc []  - 0 Routine Transfer to another Facility (non-emergent condition) []  - 0 Routine Hospital Admission (non-emergent condition) []  - 0 New Admissions / Biomedical engineer / Ordering NPWT Apligraf, etc. , []  - 0 Emergency Hospital Admission (emergent condition) X- 1 10 Simple Discharge Coordination []  - 0 Complex (extensive) Discharge Coordination PROCESS - Special Needs []  - 0 Pediatric / Minor Patient Management []  - 0 Isolation Patient Management []  - 0 Hearing / Language / Visual special needs []  - 0 Assessment of Community assistance (transportation,  D/C planning, etc.) []  - 0 Additional assistance / Altered mentation []  - 0 Support Surface(s) Assessment (bed, cushion, seat, etc.) INTERVENTIONS - Wound Cleansing / Measurement X -  Simple Wound Cleansing - one wound 1 5 []  - 0 Complex Wound Cleansing - multiple wounds []  - 0 Wound Imaging (photographs - any number of wounds) []  - 0 Wound Tracing (instead of photographs) []  - 0 Simple Wound Measurement - one wound []  - 0 Complex Wound Measurement - multiple wounds INTERVENTIONS - Wound Dressings []  - 0 Small Wound Dressing one or multiple wounds []  - 0 Medium Wound Dressing one or multiple wounds X- 1 20 Large Wound Dressing one or multiple wounds []  - 0 Application of Medications - topical []  - 0 Application of Medications - injection INTERVENTIONS - Miscellaneous []  - 0 External ear exam []  - 0 Specimen Collection (cultures, biopsies, blood, body fluids, etc.) []  - 0 Specimen(s) / Culture(s) sent or taken to Lab for analysis []  - 0 Patient Transfer (multiple staff / Civil Service fast streamer / Similar devices) []  - 0 Simple Staple / Suture removal (25 or less) []  - 0 Complex Staple / Suture removal (26 or more) []  - 0 Hypo / Hyperglycemic Management (close monitor of Blood Glucose) []  - 0 Ankle / Brachial Index (ABI) - do not check if billed separately X- 1 5 Vital Signs Has the patient been seen at the hospital within the last three years: Yes Total Score: 85 Level Of Care: New/Established - Level 3 Electronic Signature(s) Signed: 06/24/2020 6:17:09 PM By: [name] Hurst RN, BSN Entered By: [name] Hurst on 06/24/2020 11:49:14 -------------------------------------------------------------------------------- Encounter Discharge Information Details Patient Name: Date of Service: Erin [name], Erin [name] [name] 06/24/2020 11:15 A M Medical Record Number: [id number] Patient Account Number: [id number] Date of Birth/Sex: Treating RN: 10-18-1930 (87 y.o. Erin [name] Primary Care  Erin [name]: [name] Other Clinician: Referring Baani Bober: Treating Delani Kohli/Extender: Volney [employer] in Treatment: 53 Encounter Discharge Information Items Discharge Condition: Stable Ambulatory Status: Wheelchair Discharge Destination: Home Transportation: Private Auto Accompanied By: alone Schedule Follow-up Appointment: Yes Clinical Summary of Care: Patient Declined Electronic Signature(s) Signed: 06/24/2020 6:17:09 PM By: [name] Hurst RN, BSN Entered By: [name] Hurst on 06/24/2020 11:49:53 -------------------------------------------------------------------------------- Wound Assessment Details Patient Name: Date of Service: Erin [name] [name] 06/24/2020 11:15 A M Medical Record Number: [id number] Patient Account Number: [id number] Date of Birth/Sex: Treating RN: [birth year] (87 y.o. Erin [name] Primary Care Sheray Grist: [name] Other Clinician: Referring Lorraine Terriquez: Treating Starlina Lapre/Extender: Volney American in Treatment: 62 Wound Status Wound Number: 10 Primary Etiology: Lymphedema Wound Location: Right, Medial Lower Leg Secondary Venous Leg Ulcer Etiology: Wounding Event: Gradually Appeared Wound Status: Open Date Acquired: 06/02/2020 Comorbid Cataracts, Glaucoma, Middle ear problems, Anemia, Weeks Of Treatment: 2 History: Hypertension Clustered Wound: Yes Wound Measurements Length: (cm) 0.3 Width: (cm) 0.4 Depth: (cm) 0.1 Clustered Quantity: 1 Area: (cm) 0.094 Volume: (cm) 0.009 % Reduction in Area: 99.3% % Reduction in Volume: 99.4% Epithelialization: Large (67-100%) Tunneling: No Undermining: No Wound Description Classification: Full Thickness Without Exposed Support Structu Wound Margin: Distinct, outline attached Exudate Amount: Medium Exudate Type: Serosanguineous Exudate Color: red, brown res Foul Odor After Cleansing: No [name]/Fibrino Yes Wound Bed Granulation Amount: Large (67-100%)  Exposed Structure Granulation Quality: Red, Pink, Pale Fascia Exposed: No Necrotic Amount: Small (1-33%) Fat Layer (Subcutaneous Tissue) Exposed: Yes Tendon Exposed: No Muscle Exposed: No Joint Exposed: No Bone Exposed: No Treatment Notes Wound #10 (Lower Leg) Wound Laterality: Right, Medial Cleanser Peri-Wound Care Triamcinolone 15 (g) Discharge Instruction: Use triamcinolone 15 (g) mix with lotion to lower leg Sween Lotion (Moisturizing lotion) Discharge Instruction: Apply moisturizing lotion as  directed Topical Primary Dressing KerraCel Ag Gelling Fiber Dressing, 2x2 in (silver alginate) Discharge Instruction: Apply silver alginate to any weeping areas Secondary Dressing ABD Pad, 8x10 Discharge Instruction: Apply over primary dressing as directed. Secured With Compression Wrap Kerlix Roll 4.5x3.1 (in/yd) Discharge Instruction: Apply Kerlix and Coban compression as directed. Coban Self-Adherent Wrap 4x5 (in/yd) Discharge Instruction: Apply over Kerlix as directed. Compression Stockings Add-Ons Electronic Signature(s) Signed: 06/24/2020 6:17:09 PM By: [name] Hurst RN, BSN Entered By: [name] Hurst on 06/24/2020 11:48:18 -------------------------------------------------------------------------------- [place] Details Patient Name: Date of Service: Erin [name], CHA [name] 06/24/2020 11:15 A M Medical Record Number: [id number] Patient Account Number: [id number] Date of Birth/Sex: Treating RN: Jun 18, 1930 (87 y.o. Erin [name] Primary Care Trenia Tennyson: [name] Other Clinician: Referring Doralene Glanz: Treating Craig Wisnewski/Extender: [employer]ney [employer] in Treatment: 62 Vital Signs Time Taken: 11:16 Temperature (F): 98.4 Height (in): 60 Pulse (bpm): 87 Weight (lbs): 130 Respiratory Rate (breaths/min): 16 Body Mass Index (BMI): 25.4 Blood Pressure (mmHg): 191/79 Reference Range: 80 - 120 mg / dl Electronic Signature(s) Signed: 06/24/2020 6:17:09 PM  By: [name] Hurst RN, BSN Entered By: [name] Hurst on 06/24/2020 11:48:01

## 2020-06-24 NOTE — Progress Notes (Signed)
[name], [name] ([id number]) Visit Report for 06/24/2020 SuperBill Details Patient Name: Date of Service: Erin [name] [name] 06/24/2020 Medical Record Number: [id number] Patient Account Number: [id number] Date of Birth/Sex: Treating RN: 09-21-1930 (87 y.o. [name] Primary Care Provider: [name] Other Clinician: Referring Provider: Treating Provider/Extender: Volney American in Treatment: 62 Diagnosis Coding ICD-10 Codes Code Description L03.115 Cellulitis of right lower limb I87.331 Chronic venous hypertension (idiopathic) with ulcer and inflammation of right lower extremity L97.812 Non-pressure chronic ulcer of other part of right lower leg with fat layer exposed I89.0 Lymphedema, not elsewhere classified Facility Procedures CPT4 Code Description Modifier Quantity [id number] - WOUND CARE VISIT-LEV 3 EST PT 1 Electronic Signature(s) Signed: 06/24/2020 5:23:41 PM By: Linton Ham MD Signed: 06/24/2020 6:17:09 PM By: [name] RN, BSN Entered By: [name] on 06/24/2020 11:50:02

## 2020-06-30 ENCOUNTER — Encounter (HOSPITAL_BASED_OUTPATIENT_CLINIC_OR_DEPARTMENT_OTHER): Payer: Medicare HMO | Admitting: Physician Assistant

## 2020-06-30 ENCOUNTER — Other Ambulatory Visit: Payer: Self-pay

## 2020-06-30 DIAGNOSIS — I87331 Chronic venous hypertension (idiopathic) with ulcer and inflammation of right lower extremity: Secondary | ICD-10-CM | POA: Diagnosis not present

## 2020-06-30 NOTE — Progress Notes (Addendum)
Erin [name] ([id number]) Visit Report for 06/30/2020 Chief Complaint Document Details Patient Name: Date of Service: Erin [name] [name] 06/30/2020 11:00 A M Medical Record Number: [id number] Patient Account Number: [id number] Date of Birth/Sex: Treating RN: April 11, 1930 (87 y.o. [name] Primary Care Provider: [name] Other Clinician: Referring Provider: Treating Provider/Extender: [name] in Treatment: 79 Information Obtained from: Patient Chief Complaint 03/22/17; patient is here for review of wounds on her bilateral legs 4/11; right lower extremity swelling and erythema, small wounds to the medial lower right leg Electronic Signature(s) Signed: 06/30/2020 11:04:08 AM By: Worthy Keeler PA-C Entered By: Worthy Keeler on 06/30/2020 11:04:08 -------------------------------------------------------------------------------- HPI Details Patient Name: Date of Service: Erin [name] [name] 06/30/2020 11:00 A M Medical Record Number: [id number] Patient Account Number: [id number] Date of Birth/Sex: Treating RN: 1931/01/18 (87 y.o. [name] Primary Care Provider: [name] Other Clinician: Referring Provider: Treating Provider/Extender: [name] in Treatment: 48 History of Present Illness HPI Description: We to find a new wound on the left 03/22/17; this is an 87 year old woman. There is not a lot of information in care everywhere on this patient. She had a history of a malignant neoplasm her left breast for which she had lumpectomy but she did not have radiation. Apparently she has had long- standing edema and her lower legs and she was seen in the wound care center at [facility] in [place] by [name] October. Both her legs were wrapped however it sounds as though the left leg became secondarily infected she apparently had MRSA and she was admitted the hospital. Not really turn  for wound care. She lives at home on her own and has apparently [facility] home health care. We are not exactly clear what [facility] is doing to her legs but it does not involve compression. She is not a diabetic. ABIs in our clinic were noncompressible bilaterally 03/30/17; this is a patient who has bilateral chronic venous insufficiency, severe venous inflammation. We had a nice description number legs from a note by [name] dating 2010 describing edematous warm legs with erythema. This suggests that she has chronic venous insufficiency with inflammation.. We have arterial studies dated 12/29/16 again from [facility]. This showed a right ABI at 1 and a TBI of 0.81 on the left the ABI was 1.25 and a TBI of 0.72. This was just she does not have significant arterial insufficiency. She had normal triphasic waveforms noted that she was admitted to hospital in late October with MSSA cellulitis Last visit we wrapped both her legs. She had an open area predominantly on the left anterior but weeping edema on the right anterior leg. She has done well there is only one at anterior left tibial wound 04/06/17; the patient has no open area on her right leg. Her remaining wound is on the left anterior leg, the left posterior leg wound is healed. We have been using silver alginate changed to [facility] on the left leg today she has home health 04/13/17; still no open area on her right leg. Her remaining wound on the left anterior leg. We have been using using Hydrofera Blue 04/20/17; no open area on the right leg although the edema here is somewhat disfiguring. Her remaining wound is on the left anterior leg we've been using Hydrofera Blue with improvement in dimensions. She has [facility] home health and lives in [place]. She requests to come  every 2 weeks because of the distance involved in coming here 05/04/17; there is no open area on the right leg. She has a very small left anterior leg wound  that remains. However what remains on the left still has some depth and a not to viable looking surface. It is too small to really attempt debridement. She has home health coming out to her home in [facility] 05/18/17; there is no open area on either leg. She has significant chronic venous insufficiency. The left anterior leg wound that was still open 2 weeks ago has closed. She has home health coming out. We are going to order Juxtalite stockings for both legs. She is agreed to pay for these privately. We will order them from [facility] READMISSION 04/14/2019 This is a patient we previously had in this clinic in 2019 for 3 months. She had a wound on the left anterior greater than right anterior leg. According to my notes we discharged her and juxta lite stockings although it does not appear that she ever got them. The history here is a bit difficult. She apparently has had a wound on the left lower leg for the last 6 months. She required admission to hospital in [facility] for apparent cellulitis just before Christmas was discharged home she has an open wound. She has a home health agency although we are not exactly sure which one. They are applying silver alginate. No compression Past medical history is reviewed she has chronic venous insufficiency and a history of left breast CA. 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 She did not have any compression on the wounds today. 2/8; substantial wound on the right posterior calf and a smaller area on the left medial calf. We are using Iodoflex to both wound areas under compression. We have better edema control We to find a new wound on the left anterior medial calf 2/15; the area on the left medial calf is closed over. Substantial area on the right posterior and lateral calf. We have been using Iodoflex although the patient complains of  pain and wonders if there is not an alternative. She has home health changing the dressing twice a week and she sees [place] once 2/22; the wound on the left medial calf is closed over. Her edema control is not good here but she cannot get on stockings. This substantial wound is on the right lateral and right posterior calf. Changed her to a hydrofera Blue last week. Gradual improvement in wound area. 3/8; wound on the left leg remains closed. The substantial wound is on the right lateral and right posterior calf. This is measuring smaller 3/22; 2-week follow-up. Substantial area on the right posterior and lateral calf measures slightly smaller. We are using Hydrofera Blue under compression 4/5; 2-week follow-up. Substantial area on the right posterior lateral calf. This measures slightly smaller. She has islands of epithelialization we have been using Hydrofera Blue under compression 4/19; 2-week follow-up. We have been using Hydrofera Blue however there is maceration around the wound. I will change to silver alginate today. She has islands of epithelialization however I did not see much evidence that things were improving here. 5/6; 2-week follow-up. I put silver alginate on this because of maceration around the wound last time. However she comes in today with a lot of very adherent debris on the wound of the right  lateral leg. 5/17; 2-week follow-up. Using Hydrofera Blue under compression. The patient has epithelialization including islands of epithelialization in the middle of fairly substantial wound area on the right posterior lateral calf 6/1; 2-week follow-up. Using Hydrofera Blue under compression. She seems to have increasing epithelialization. This is an irregular wound going from medial to lateral posteriorly in the right posterior calf. This would not be easy wound to get reproducible measurement 08/25/2019 upon evaluation today patient appears to actually be doing better in regard to the wound  on her leg at this point. We have been using Adaptic followed by [facility] this is keeping her from getting stuck. The patient is aware that this is also potentially slowing down her healing simply due to the fact that the [facility] not in direct contact with the wound bed. Nonetheless she was not happy with the way it was sticking previous which is why we switched to this according to what I can read in the note and hear from the patient and her aide with her today. 6/28; 2-week follow-up. We are following this patient for severe wound on the right lower leg secondary to chronic venous insufficiency using Hydrofera Blue under compression we are making gradual improvements. She arrives in clinic today with what looks like a contusion on the left anterior mid tibia. She says she was washing her leg and pulled some skin. However there is a fairly large wound with surrounding erythema and some swelling 7/12; 2-week follow-up. Right lower leg wound not quite as good as I thought. There is still islands of epithelialization we are using Hydrofera Blue with a contact layer I would like to see if we can get rid of the contact layer at this point. The new area from last time is a lot smaller She did not take the cephalexin I initially prescribed for the erythema edema around the left leg due to fears about diarrhea. She did tolerate doxycycline that we subsequently called in 7/26; we are doing 2-week follow-up. Wound measurements are about the same. Still the same fibrinous debris we have been using Hydrofera Blue. Change to Iodoflex today. 8/17; been using Iodoflex. Some improvement in the surface area of the large wound on the right posterior calf left anterior actually slightly larger. 100% [name] covered although it is less adherent 9/seven 3-week follow-up. Been using Iodoflex. We still have continued improvement in the surface area of the large wound on the right calf. The small area on the  left anteriorly still is not closed. 01/14/2020 on evaluation today patient appears to be doing decently well in regard to her leg ulcer. Fortunately there is no signs of active infection at this time. With that being said the wound on the right leg laterally is still open and given her trouble. Fortunately there is no signs of active infection at this time. No fevers, chills, nausea, vomiting, or diarrhea. 11/18; I have not seen this woman in quite some time. She told me she had Covid in September. She has chronic venous insufficiency with lymphedema and initially had wounds on her right lateral and left anterior lower legs. The left anterior leg is closed she is wearing a stocking 02/11/2020 upon evaluation today patient appears to be doing well with regard to her leg ulcer. This appears to be somewhat dry which is good although I think the Iodoflex is probably keeping this much too dry at this point. I think it may be time to switch to something different since it  has accomplish the goal and we were kind of looking for. Fortunately there is no signs of active infection at this time. 02/25/2020 on evaluation today patient appears to be doing well with regard to her wound. She has been tolerating the dressing changes without complication. Fortunately there is no signs of active infection at this time. No fevers, chills, nausea, vomiting, or diarrhea. 03/17/2020 upon evaluation today patient's wound on the right leg seems to be doing quite well. I am very pleased with where things stand today. There does not appear to be any evidence of infection and overall I think she is managing quite nicely at this point. She is pleased. With that being said she does have some swelling of the left leg which is quite significant and again she has not really been checked for a possibility of a blood clot I think we may need to check a venous Doppler study. 03/24/2020 upon evaluation today patient appears to be doing  quite well with regard to her wounds currently. Fortunately there is no signs of active infection at this time. No fever or chills noted. With that being said she still has a lot of edema of the left leg the DVT study was negative which is great news. I am very pleased in that regard. With that being said I do believe that the patient likely needs compression stockings in this area and to make sure that she keeps edema to control so she does not end up with issues. As well. Fortunately there is no signs of active infection at this time. 04/07/2020 on evaluation today patient appears to be doing okay in regard to her wound on the right lateral leg although the wrap hospital by the nurse yesterday apparently is not doing as well as we would like to see. It caused some kind of injury outside of exactly where the wound is and this again is new and unfortunate. With that being said there does not appear to be any signs of active infection which is great news. No fever chills noted 04/14/2020 upon evaluation today patient appears to be doing decently well in regard to her wounds. She has been tolerating the dressing changes without complication. Fortunately there does not appear to be any evidence of active infection at this time. No fevers, chills, nausea, vomiting, or diarrhea. She did well with the wraps staying on for the week and they state up no problems. With that being said she tells me that she just cannot come in every week here to the clinic. For that reason she wants [place] to get the home health nurses to come back out to see her. I can definitely do that but I am not going to continue to allow them to come out to see her if the wrap is not done appropriately. For that reason I do want her to make sure that it is done correctly. If it is not then we can need to just bring her in here weekly to make sure it is until we get this wound healed. 05/05/2020 upon evaluation today patient actually appears to be  completely healed which is great news. Overall I am extremely pleased with where things stand today. I see no signs of active infection at this point. 06/09/2020 patient presents for reevaluation here in the clinic she was actually seen last February 23 of this year. That is when she discharged this healed. Nonetheless based on what I am seeing right now it appears that she has  cellulitis of the right lower extremity left lower extremity actually appears to be doing quite well and I think the compression wraps are doing a good job for her. There is no signs of active infection on this side. 4/4; patient patient returns to clinic. Most of the wounds in the right leg are completely healed and/or epithelialized however some of them look vulnerable. She still has erythema limited to the distal part of her foot but there is no tenderness here. She is not a diabetic. She is completing her antibiotics. Edema control in the right leg is good 4/11; Patient presents today with increased redness and swelling to the distal portion of her right foot. Per nursing staff this is worsened compared to last clinic visit. Patient states she is taking Bactrim however not tolerating it well and is not taking the medication at times. She denies pain currently. The wrap on the foot had rolled up over the past week. She currently denies fever/chills, nausea/vomiting or drainage from her wounds. She states that the wounds to her lower extremity have gotten smaller. 06/30/2020 upon evaluation today patient appears to be doing well with regard to her wound. She has been tolerating the dressing changes without complication. Fortunately there is no signs of active infection at this time. No fevers, chills, nausea, vomiting, or diarrhea. With that being said she does have some redness on the tip of her foot but again this is right where the wrap ended and the toes and end of her foot began. I think this is more due to the wrap  being kind of tight in that regard. Fortunately there does not appear to be any signs of infection honestly although we may go ahead and we extend her antibiotic for her for a bit longer just to make sure that she is covered in that regard since she will not be able to come back for 2 weeks. Electronic Signature(s) Signed: 06/30/2020 11:44:08 AM By: Worthy Keeler PA-C Entered By: Worthy Keeler on 06/30/2020 11:44:08 -------------------------------------------------------------------------------- Physical Exam Details Patient Name: Date of Service: Erin [name] [name] 06/30/2020 11:00 A M Medical Record Number: [id number] Patient Account Number: [id number] Date of Birth/Sex: Treating RN: Mar 30, 1930 (87 y.o. [name] Primary Care Provider: [name] Other Clinician: Referring Provider: Treating Provider/Extender: [name] in Treatment: 91 Constitutional Well-nourished and well-hydrated in no acute distress. Respiratory normal breathing without difficulty. Psychiatric this patient is able to make decisions and demonstrates good insight into disease process. Alert and Oriented x 3. pleasant and cooperative. Notes Upon inspection patient's wound bed actually showed signs of good granulation epithelization at this point. There does not appear to be any evidence of active infection although she does have some erythema on the distal portion of her foot I think this is more due to wrap issue than anything else. Electronic Signature(s) Signed: 06/30/2020 11:44:25 AM By: Worthy Keeler PA-C Entered By: Worthy Keeler on 06/30/2020 11:44:24 -------------------------------------------------------------------------------- Physician Orders Details Patient Name: Date of Service: Erin [name] [name] 06/30/2020 11:00 A M Medical Record Number: [id number] Patient Account Number: [id number] Date of Birth/Sex: Treating RN: Jan 17, 1931 (87 y.o. [name] Primary Care Provider: [name] Other Clinician: Referring Provider: Treating Provider/Extender: [name] in Treatment: 59 Verbal / Phone Orders: No Diagnosis Coding ICD-10 Coding Code Description L03.115 Cellulitis of right lower limb I87.331 Chronic venous hypertension (idiopathic) with ulcer and inflammation of right lower  extremity L97.812 Non-pressure chronic ulcer of other part of right lower leg with fat layer exposed I89.0 Lymphedema, not elsewhere classified Follow-up Appointments ppointment in 2 weeks. - may cancel appointment if redness resolves - Dr. Heber [place] Return A Bathing/ Shower/ Hygiene May shower and wash wound with soap and water. Edema Control - Lymphedema / SCD / Other Bilateral Lower Extremities Elevate legs to the level of the heart or above for 30 minutes daily and/or when sitting, a frequency of: - throughout the day Avoid standing for long periods of time. Exercise regularly Compression stocking or Garment 20-30 mm/Hg pressure to: - farrow wrap to both legs daily, apply in the morning and remove at night Non Wound Condition Other Non Wound Condition Orders/Instructions: - go to emergency room for fever, nausea/vomiting, or increased pain in right leg Patient Medications llergies: No Known Allergies A Notifications Medication Indication Start End 06/30/2020 Keflex DOSE 1 - oral 500 mg capsule - 1 capsule oral taken 2 times per day for 14 days Electronic Signature(s) Signed: 06/30/2020 11:47:11 AM By: Worthy Keeler PA-C Entered By: Worthy Keeler on 06/30/2020 11:47:11 -------------------------------------------------------------------------------- Problem List Details Patient Name: Date of Service: Erin [name] [name] 06/30/2020 11:00 A M Medical Record Number: [id number] Patient Account Number: [id number] Date of Birth/Sex: Treating RN: Mar 16, 1930 (87 y.o. [name] Primary Care  Provider: [name] Other Clinician: Referring Provider: Treating Provider/Extender: [name] in Treatment: 69 Active Problems ICD-10 Encounter Code Description Active Date MDM Diagnosis L03.115 Cellulitis of right lower limb 06/21/2020 No Yes I87.331 Chronic venous hypertension (idiopathic) with ulcer and inflammation of right 04/14/2019 No Yes lower extremity L97.812 Non-pressure chronic ulcer of other part of right lower leg with fat layer 04/14/2019 No Yes exposed I89.0 Lymphedema, not elsewhere classified 04/14/2019 No Yes Inactive Problems ICD-10 Code Description Active Date Inactive Date L97.822 Non-pressure chronic ulcer of other part of left lower leg with fat layer exposed 09/08/2019 09/08/2019 L97.821 Non-pressure chronic ulcer of other part of left lower leg limited to breakdown of skin 04/21/2019 04/21/2019 L03.116 Cellulitis of left lower limb 09/08/2019 09/08/2019 Resolved Problems Electronic Signature(s) Signed: 06/30/2020 11:04:02 AM By: Worthy Keeler PA-C Entered By: Worthy Keeler on 06/30/2020 11:04:01 -------------------------------------------------------------------------------- Progress Note Details Patient Name: Date of Service: Erin [name] [name] 06/30/2020 11:00 A M Medical Record Number: [id number] Patient Account Number: [id number] Date of Birth/Sex: Treating RN: May 29, 1930 (87 y.o. [name] Primary Care Provider: [name] Other Clinician: Referring Provider: Treating Provider/Extender: [name] in Treatment: 42 Subjective Chief Complaint Information obtained from Patient 03/22/17; patient is here for review of wounds on her bilateral legs 4/11; right lower extremity swelling and erythema, small wounds to the medial lower right leg History of Present Illness (HPI) We to find a new wound on the left 03/22/17; this is an 87 year old woman. There is not a lot of information in care  everywhere on this patient. She had a history of a malignant neoplasm her left breast for which she had lumpectomy but she did not have radiation. Apparently she has had long-standing edema and her lower legs and she was seen in the wound care center at [facility] in [place] by [name] October. Both her legs were wrapped however it sounds as though the left leg became secondarily infected she apparently had MRSA and she was admitted the hospital. Not really turn for wound care. She lives at home on her own  and has apparently [facility] home health care. We are not exactly clear what [facility] is doing to her legs but it does not involve compression. She is not a diabetic. ABIs in our clinic were noncompressible bilaterally 03/30/17; this is a patient who has bilateral chronic venous insufficiency, severe venous inflammation. We had a nice description number legs from a note by [name] dating 2010 describing edematous warm legs with erythema. This suggests that she has chronic venous insufficiency with inflammation.. We have arterial studies dated 12/29/16 again from [facility] This showed a right ABI at 1 and a TBI of 0.81 on the left the ABI was 1.25 and a TBI of 0.72. This was just she does not have significant arterial insufficiency. She had normal triphasic waveforms noted that she was admitted to hospital in late October with MSSA cellulitis Last visit we wrapped both her legs. She had an open area predominantly on the left anterior but weeping edema on the right anterior leg. She has done well there is only one at anterior left tibial wound 04/06/17; the patient has no open area on her right leg. Her remaining wound is on the left anterior leg, the left posterior leg wound is healed. We have been using silver alginate changed to [facility] on the left leg today she has home health 04/13/17; still no open area on her right leg. Her remaining wound on the left anterior leg. We  have been using using Hydrofera Blue 04/20/17; no open area on the right leg although the edema here is somewhat disfiguring. Her remaining wound is on the left anterior leg we've been using Hydrofera Blue with improvement in dimensions. She has [facility] home health and lives in [place]. She requests to come every 2 weeks because of the distance involved in coming here 05/04/17; there is no open area on the right leg. She has a very small left anterior leg wound that remains. However what remains on the left still has some depth and a not to viable looking surface. It is too small to really attempt debridement. She has home health coming out to her home in [facility] 05/18/17; there is no open area on either leg. She has significant chronic venous insufficiency. The left anterior leg wound that was still open 2 weeks ago has closed. She has home health coming out. We are going to order Juxtalite stockings for both legs. She is agreed to pay for these privately. We will order them from [facility] READMISSION 04/14/2019 This is a patient we previously had in this clinic in 2019 for 3 months. She had a wound on the left anterior greater than right anterior leg. According to my notes we discharged her and juxta lite stockings although it does not appear that she ever got them. The history here is a bit difficult. She apparently has had a wound on the left lower leg for the last 6 months. She required admission to hospital in [facility] for apparent cellulitis just before Christmas was discharged home she has an open wound. She has a home health agency although we are not exactly sure which one. They are applying silver alginate. No compression Past medical history is reviewed she has chronic venous insufficiency and a history of left breast CA. 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 She did not have any compression  on the wounds today. 2/8; substantial wound on the right posterior calf and a smaller area on the left medial calf. We are using Iodoflex to both wound areas under compression. We have better edema control We to find a new wound on the left anterior medial calf 2/15; the area on the left medial calf is closed over. Substantial area on the right posterior and lateral calf. We have been using Iodoflex although the patient complains of pain and wonders if there is not an alternative. She has home health changing the dressing twice a week and she sees [place] once 2/22; the wound on the left medial calf is closed over. Her edema control is not good here but she cannot get on stockings. This substantial wound is on the right lateral and right posterior calf. Changed her to a hydrofera Blue last week. Gradual improvement in wound area. 3/8; wound on the left leg remains closed. The substantial wound is on the right lateral and right posterior calf. This is measuring smaller 3/22; 2-week follow-up. Substantial area on the right posterior and lateral calf measures slightly smaller. We are using Hydrofera Blue under compression 4/5; 2-week follow-up. Substantial area on the right posterior lateral calf. This measures slightly smaller. She has islands of epithelialization we have been using Hydrofera Blue under compression 4/19; 2-week follow-up. We have been using Hydrofera Blue however there is maceration around the wound. I will change to silver alginate today. She has islands of epithelialization however I did not see much evidence that things were improving here. 5/6; 2-week follow-up. I put silver alginate on this because of maceration around the wound last time. However she comes in today with a lot of very adherent debris on the wound of the right lateral leg. 5/17; 2-week follow-up. Using Hydrofera Blue under compression. The patient has epithelialization including  islands of epithelialization in the middle of fairly substantial wound area on the right posterior lateral calf 6/1; 2-week follow-up. Using Hydrofera Blue under compression. She seems to have increasing epithelialization. This is an irregular wound going from medial to lateral posteriorly in the right posterior calf. This would not be easy wound to get reproducible measurement 08/25/2019 upon evaluation today patient appears to actually be doing better in regard to the wound on her leg at this point. We have been using Adaptic followed by [facility] this is keeping her from getting stuck. The patient is aware that this is also potentially slowing down her healing simply due to the fact that the [facility] not in direct contact with the wound bed. Nonetheless she was not happy with the way it was sticking previous which is why we switched to this according to what I can read in the note and hear from the patient and her aide with her today. 6/28; 2-week follow-up. We are following this patient for severe wound on the right lower leg secondary to chronic venous insufficiency using Hydrofera Blue under compression we are making gradual improvements. She arrives in clinic today with what looks like a contusion on the left anterior mid tibia. She says she was washing her leg and pulled some skin. However there is a fairly large wound with surrounding erythema and some swelling 7/12; 2-week follow-up. Right lower leg wound not quite as good as I thought. There is still islands of epithelialization we are using Hydrofera Blue with a contact layer I would like to see if we can get rid of the contact layer at this point. ooThe new  area from last time is a lot smaller ooShe did not take the cephalexin I initially prescribed for the erythema edema around the left leg due to fears about diarrhea. She did tolerate doxycycline that we subsequently called in 7/26; we are doing 2-week follow-up. Wound  measurements are about the same. Still the same fibrinous debris we have been using Hydrofera Blue. Change to Iodoflex today. 8/17; been using Iodoflex. Some improvement in the surface area of the large wound on the right posterior calf left anterior actually slightly larger. 100% [name] covered although it is less adherent 9/seven 3-week follow-up. Been using Iodoflex. We still have continued improvement in the surface area of the large wound on the right calf. The small area on the left anteriorly still is not closed. 01/14/2020 on evaluation today patient appears to be doing decently well in regard to her leg ulcer. Fortunately there is no signs of active infection at this time. With that being said the wound on the right leg laterally is still open and given her trouble. Fortunately there is no signs of active infection at this time. No fevers, chills, nausea, vomiting, or diarrhea. 11/18; I have not seen this woman in quite some time. She told me she had Covid in September. She has chronic venous insufficiency with lymphedema and initially had wounds on her right lateral and left anterior lower legs. The left anterior leg is closed she is wearing a stocking 02/11/2020 upon evaluation today patient appears to be doing well with regard to her leg ulcer. This appears to be somewhat dry which is good although I think the Iodoflex is probably keeping this much too dry at this point. I think it may be time to switch to something different since it has accomplish the goal and we were kind of looking for. Fortunately there is no signs of active infection at this time. 02/25/2020 on evaluation today patient appears to be doing well with regard to her wound. She has been tolerating the dressing changes without complication. Fortunately there is no signs of active infection at this time. No fevers, chills, nausea, vomiting, or diarrhea. 03/17/2020 upon evaluation today patient's wound on the right leg seems to  be doing quite well. I am very pleased with where things stand today. There does not appear to be any evidence of infection and overall I think she is managing quite nicely at this point. She is pleased. With that being said she does have some swelling of the left leg which is quite significant and again she has not really been checked for a possibility of a blood clot I think we may need to check a venous Doppler study. 03/24/2020 upon evaluation today patient appears to be doing quite well with regard to her wounds currently. Fortunately there is no signs of active infection at this time. No fever or chills noted. With that being said she still has a lot of edema of the left leg the DVT study was negative which is great news. I am very pleased in that regard. With that being said I do believe that the patient likely needs compression stockings in this area and to make sure that she keeps edema to control so she does not end up with issues. As well. Fortunately there is no signs of active infection at this time. 04/07/2020 on evaluation today patient appears to be doing okay in regard to her wound on the right lateral leg although the wrap hospital by the nurse yesterday apparently is  not doing as well as we would like to see. It caused some kind of injury outside of exactly where the wound is and this again is new and unfortunate. With that being said there does not appear to be any signs of active infection which is great news. No fever chills noted 04/14/2020 upon evaluation today patient appears to be doing decently well in regard to her wounds. She has been tolerating the dressing changes without complication. Fortunately there does not appear to be any evidence of active infection at this time. No fevers, chills, nausea, vomiting, or diarrhea. She did well with the wraps staying on for the week and they state up no problems. With that being said she tells me that she just cannot come in every week  here to the clinic. For that reason she wants [place] to get the home health nurses to come back out to see her. I can definitely do that but I am not going to continue to allow them to come out to see her if the wrap is not done appropriately. For that reason I do want her to make sure that it is done correctly. If it is not then we can need to just bring her in here weekly to make sure it is until we get this wound healed. 05/05/2020 upon evaluation today patient actually appears to be completely healed which is great news. Overall I am extremely pleased with where things stand today. I see no signs of active infection at this point. 06/09/2020 patient presents for reevaluation here in the clinic she was actually seen last February 23 of this year. That is when she discharged this healed. Nonetheless based on what I am seeing right now it appears that she has cellulitis of the right lower extremity left lower extremity actually appears to be doing quite well and I think the compression wraps are doing a good job for her. There is no signs of active infection on this side. 4/4; patient patient returns to clinic. Most of the wounds in the right leg are completely healed and/or epithelialized however some of them look vulnerable. She still has erythema limited to the distal part of her foot but there is no tenderness here. She is not a diabetic. She is completing her antibiotics. Edema control in the right leg is good 4/11; Patient presents today with increased redness and swelling to the distal portion of her right foot. Per nursing staff this is worsened compared to last clinic visit. Patient states she is taking Bactrim however not tolerating it well and is not taking the medication at times. She denies pain currently. The wrap on the foot had rolled up over the past week. She currently denies fever/chills, nausea/vomiting or drainage from her wounds. She states that the wounds to her lower extremity have  gotten smaller. 06/30/2020 upon evaluation today patient appears to be doing well with regard to her wound. She has been tolerating the dressing changes without complication. Fortunately there is no signs of active infection at this time. No fevers, chills, nausea, vomiting, or diarrhea. With that being said she does have some redness on the tip of her foot but again this is right where the wrap ended and the toes and end of her foot began. I think this is more due to the wrap being kind of tight in that regard. Fortunately there does not appear to be any signs of infection honestly although we may go ahead and we extend her antibiotic for  her for a bit longer just to make sure that she is covered in that regard since she will not be able to come back for 2 weeks. Objective Constitutional Well-nourished and well-hydrated in no acute distress. Vitals Time Taken: 11:12 AM, Height: 60 in, Weight: 130 lbs, BMI: 25.4, Temperature: 97.8 F, Pulse: 85 bpm, Respiratory Rate: 17 breaths/min, Blood Pressure: 191/67 mmHg. Respiratory normal breathing without difficulty. Psychiatric this patient is able to make decisions and demonstrates good insight into disease process. Alert and Oriented x 3. pleasant and cooperative. General Notes: Upon inspection patient's wound bed actually showed signs of good granulation epithelization at this point. There does not appear to be any evidence of active infection although she does have some erythema on the distal portion of her foot I think this is more due to wrap issue than anything else. Integumentary (Hair, Skin) Wound #10 status is Open. Original cause of wound was Gradually Appeared. The date acquired was: 06/02/2020. The wound has been in treatment 3 weeks. The wound is located on the Right,Medial Lower Leg. The wound measures 0cm length x 0cm width x 0cm depth; 0cm^2 area and 0cm^3 volume. Assessment Active Problems ICD-10 Cellulitis of right lower  limb Chronic venous hypertension (idiopathic) with ulcer and inflammation of right lower extremity Non-pressure chronic ulcer of other part of right lower leg with fat layer exposed Lymphedema, not elsewhere classified Plan Follow-up Appointments: Return Appointment in 2 weeks. - may cancel appointment if redness resolves - Dr. Heber [place] Bathing/ Shower/ Hygiene: May shower and wash wound with soap and water. Edema Control - Lymphedema / SCD / Other: Elevate legs to the level of the heart or above for 30 minutes daily and/or when sitting, a frequency of: - throughout the day Avoid standing for long periods of time. Exercise regularly Compression stocking or Garment 20-30 mm/Hg pressure to: - farrow wrap to both legs daily, apply in the morning and remove at night Non Wound Condition: Other Non Wound Condition Orders/Instructions: - go to emergency room for fever, nausea/vomiting, or increased pain in right leg The following medication(s) was prescribed: Keflex oral 500 mg capsule 1 1 capsule oral taken 2 times per day for 14 days starting 06/30/2020 1. Would recommend currently that we going continue with the wound care measures as before and the patient is in agreement with the plan. This includes the use of the Velcro juxta lite compression wrap. I think this is doing a good job for her. With that being said I am going to go ahead and send in a 2-week refill of the medication to get her through until I see her back. The patient is in agreement with plan. I just 1 make sure she does not have any infection that ensues in the interim when she cannot get in to be seen. 2. Also can recommend that we go ahead and reinitiate the use of her juxta lite compression wrap. I think that is appropriate. 3. I am also can recommend the patient continue to monitor for any signs of worsening in general if anything changes she should let me know soon as possible. We will see patient back for reevaluation in 2  weeks here in the clinic. If anything worsens or changes patient will contact our office for additional recommendations. Electronic Signature(s) Signed: 06/30/2020 11:47:43 AM By: Worthy Keeler PA-C Entered By: Worthy Keeler on 06/30/2020 11:47:43 -------------------------------------------------------------------------------- SuperBill Details Patient Name: Date of Service: Erin [name] [name] 06/30/2020 Medical Record Number: [id number]  Patient Account Number: [id number] Date of Birth/Sex: Treating RN: 1930-07-31 (87 y.o. [name], [name] Primary Care Provider: [name] Other Clinician: Referring Provider: Treating Provider/Extender: [name] in Treatment: 63 Diagnosis Coding ICD-10 Codes Code Description L03.115 Cellulitis of right lower limb I87.331 Chronic venous hypertension (idiopathic) with ulcer and inflammation of right lower extremity L97.812 Non-pressure chronic ulcer of other part of right lower leg with fat layer exposed I89.0 Lymphedema, not elsewhere classified Facility Procedures CPT4 Code: [id number] Description: 99213 - WOUND CARE VISIT-LEV 3 EST PT Modifier: Quantity: 1 Physician Procedures : CPT4 Code Description Modifier [id number] - WC PHYS LEVEL 4 - EST PT ICD-10 Diagnosis Description L03.115 Cellulitis of right lower limb I87.331 Chronic venous hypertension (idiopathic) with ulcer and inflammation of right lower extremity L97.812  Non-pressure chronic ulcer of other part of right lower leg with fat layer exposed I89.0 Lymphedema, not elsewhere classified Quantity: 1 Electronic Signature(s) Signed: 06/30/2020 11:47:54 AM By: Worthy Keeler PA-C Entered By: Worthy Keeler on 06/30/2020 11:47:53

## 2020-07-01 ENCOUNTER — Encounter (HOSPITAL_BASED_OUTPATIENT_CLINIC_OR_DEPARTMENT_OTHER): Payer: Medicare HMO | Admitting: Internal Medicine

## 2020-07-14 ENCOUNTER — Observation Stay (HOSPITAL_COMMUNITY)
Admission: EM | Admit: 2020-07-14 | Discharge: 2020-07-15 | Disposition: A | Discharge status: Home / Self Care | Payer: Medicare HMO | Attending: Family Medicine | Admitting: Family Medicine

## 2020-07-14 ENCOUNTER — Encounter (HOSPITAL_COMMUNITY): Payer: Self-pay

## 2020-07-14 ENCOUNTER — Encounter (HOSPITAL_BASED_OUTPATIENT_CLINIC_OR_DEPARTMENT_OTHER): Payer: Medicare HMO | Attending: Internal Medicine | Admitting: Physician Assistant

## 2020-07-14 ENCOUNTER — Other Ambulatory Visit: Payer: Self-pay

## 2020-07-14 DIAGNOSIS — I10 Essential (primary) hypertension: Secondary | ICD-10-CM | POA: Diagnosis not present

## 2020-07-14 DIAGNOSIS — L97812 Non-pressure chronic ulcer of other part of right lower leg with fat layer exposed: Secondary | ICD-10-CM | POA: Diagnosis not present

## 2020-07-14 DIAGNOSIS — L03116 Cellulitis of left lower limb: Secondary | ICD-10-CM

## 2020-07-14 DIAGNOSIS — Z8616 Personal history of COVID-19: Secondary | ICD-10-CM | POA: Diagnosis not present

## 2020-07-14 DIAGNOSIS — Z20822 Contact with and (suspected) exposure to covid-19: Secondary | ICD-10-CM | POA: Diagnosis not present

## 2020-07-14 DIAGNOSIS — I890 Lymphedema, not elsewhere classified: Secondary | ICD-10-CM | POA: Insufficient documentation

## 2020-07-14 DIAGNOSIS — Z7982 Long term (current) use of aspirin: Secondary | ICD-10-CM | POA: Insufficient documentation

## 2020-07-14 DIAGNOSIS — I87331 Chronic venous hypertension (idiopathic) with ulcer and inflammation of right lower extremity: Secondary | ICD-10-CM | POA: Insufficient documentation

## 2020-07-14 DIAGNOSIS — L03115 Cellulitis of right lower limb: Secondary | ICD-10-CM | POA: Insufficient documentation

## 2020-07-14 DIAGNOSIS — Z853 Personal history of malignant neoplasm of breast: Secondary | ICD-10-CM | POA: Insufficient documentation

## 2020-07-14 DIAGNOSIS — C50212 Malignant neoplasm of upper-inner quadrant of left female breast: Secondary | ICD-10-CM | POA: Diagnosis present

## 2020-07-14 DIAGNOSIS — Z8614 Personal history of Methicillin resistant Staphylococcus aureus infection: Secondary | ICD-10-CM | POA: Diagnosis not present

## 2020-07-14 DIAGNOSIS — Z79899 Other long term (current) drug therapy: Secondary | ICD-10-CM | POA: Insufficient documentation

## 2020-07-14 LAB — BASIC METABOLIC PANEL
Anion gap: 9 (ref 5–15)
BUN: 54 mg/dL — ABNORMAL HIGH (ref 8–23)
CO2: 23 mmol/L (ref 22–32)
Calcium: 9.4 mg/dL (ref 8.9–10.3)
Chloride: 105 mmol/L (ref 98–111)
Creatinine, Ser: 1.34 mg/dL — ABNORMAL HIGH (ref 0.44–1.00)
GFR, Estimated: 38 mL/min — ABNORMAL LOW (ref >60)
Glucose, Bld: 146 mg/dL — ABNORMAL HIGH (ref 70–99)
Potassium: 5.1 mmol/L (ref 3.5–5.1)
Sodium: 137 mmol/L (ref 135–145)

## 2020-07-14 LAB — CBC WITH DIFFERENTIAL/PLATELET
Abs Immature Granulocytes: 0.01 10*3/uL (ref 0.00–0.07)
Basophils Absolute: 0 10*3/uL (ref 0.0–0.1)
Basophils Relative: 1 %
Eosinophils Absolute: 0 10*3/uL (ref 0.0–0.5)
Eosinophils Relative: 0 %
HCT: 33.8 % — ABNORMAL LOW (ref 36.0–46.0)
Hemoglobin: 10.5 g/dL — ABNORMAL LOW (ref 12.0–15.0)
Immature Granulocytes: 0 %
Lymphocytes Relative: 16 %
Lymphs Abs: 0.9 10*3/uL (ref 0.7–4.0)
MCH: 27.4 pg (ref 26.0–34.0)
MCHC: 31.1 g/dL (ref 30.0–36.0)
MCV: 88.3 fL (ref 80.0–100.0)
Monocytes Absolute: 0.6 10*3/uL (ref 0.1–1.0)
Monocytes Relative: 11 %
Neutro Abs: 3.9 10*3/uL (ref 1.7–7.7)
Neutrophils Relative %: 72 %
Platelets: 272 10*3/uL (ref 150–400)
RBC: 3.83 MIL/uL — ABNORMAL LOW (ref 3.87–5.11)
RDW: 15 % (ref 11.5–15.5)
WBC: 5.4 10*3/uL (ref 4.0–10.5)
nRBC: 0 % (ref 0.0–0.2)

## 2020-07-14 LAB — LACTIC ACID, PLASMA: Lactic Acid, Venous: 0.9 mmol/L (ref 0.5–1.9)

## 2020-07-14 MED ORDER — SODIUM CHLORIDE 0.9 % IV SOLN
3.0000 g | Freq: Once | INTRAVENOUS | Status: AC
  Administered 2020-07-14: 3 g via INTRAVENOUS
  Filled 2020-07-14: qty 8

## 2020-07-14 NOTE — ED Notes (Signed)
Patient's son at bedside at this time. Asking for meal for mother. EDP states that patient may eat. Patient given Diet Ginger Ale and bag lunch

## 2020-07-14 NOTE — ED Provider Notes (Signed)
11:53 PM Assumed care from Dr. Tomi Bamberger, please see their note for full history, physical and decision making until this point. In brief this is a 86 y.o. year old female who presented to the ED tonight with No chief complaint on file.     Cellulitis here. Getting wound care. They may want her admitted vs antibiotics? Pending labs and reeval.    On my exam, pretty significant erythema and warmth to right lower leg. This in concert with age and soft pressures, will plan to admit for observation.   D/w hospitalist, wants to more stability. Another liter of fluids.   Patient with persistent syst9olics above 244 and majority of MAPs above 65, will d/w medicine for admission.   Labs, studies and imaging reviewed by myself and considered in medical decision making if ordered. Imaging interpreted by radiology.  Labs Reviewed  CBC WITH DIFFERENTIAL/PLATELET - Abnormal; Notable for the following components:      Result Value   RBC 3.83 (*)    Hemoglobin 10.5 (*)    HCT 33.8 (*)    All other components within normal limits  BASIC METABOLIC PANEL - Abnormal; Notable for the following components:   Glucose, Bld 146 (*)    BUN 54 (*)    Creatinine, Ser 1.34 (*)    GFR, Estimated 38 (*)    All other components within normal limits  CULTURE, BLOOD (ROUTINE X 2)  CULTURE, BLOOD (ROUTINE X 2)  LACTIC ACID, PLASMA  LACTIC ACID, PLASMA    No orders to display    No follow-ups on file.    Hoang Reich, Corene Cornea, MD 07/15/20 [id number]

## 2020-07-14 NOTE — ED Provider Notes (Signed)
[facility] EMERGENCY DEPARTMENT Provider Note   CSN: [id number] Arrival date & time: 07/14/20  1608     History Chief complaint: Cellulitis  Erin [name] is a 87 y.o. female.  HPI   Patient presented to the ED for evaluation of leg infection.  Patient states patient has been following at the wound center for a wound in her right lower extremity.  She has been having drainage and edema and they have been keeping it wrapped.  Over the last several days she has had increasing redness and swelling.  Patient was seen at the wound center today and they were concerned by the appearance and felt that she needed to be admitted to the hospital for IV antibiotics.  Patient has not had any fevers.  No vomiting or diarrhea.  She still has a good appetite  Past Medical History:  Diagnosis Date  . Cancer [facility]) 2017   left breast cancer  . Cystitis   . HOH (hard of hearing)   . Hypertension     Patient Active Problem List   Diagnosis Date Noted  . Breast cancer of upper-inner quadrant of left female breast ([place]) 10/28/2015    Past Surgical History:  Procedure Laterality Date  . ABDOMINAL HYSTERECTOMY     1974 - Dr. Gertie Fey  . BACK SURGERY    . BREAST LUMPECTOMY     left july 2017  . BREAST LUMPECTOMY WITH RADIOACTIVE SEED LOCALIZATION Left 10/07/2015   Procedure: LEFT BREAST LUMPECTOMY WITH RADIOACTIVE SEED LOCALIZATION;  Surgeon: Autumn Messing III, MD;  Location: [place];  Service: General;  Laterality: Left;  LEFT BREAST LUMPECTOMY WITH RADIOACTIVE SEED LOCALIZATION  . BREAST SURGERY    . Lumbar spinal stenosis     1994 - Dr. Joya Salm  . RE-EXCISION OF BREAST CANCER,SUPERIOR MARGINS Left 11/22/2015   Procedure: RE-EXCISION OF LEFT BREAST CANCER, SUPERIOR MARGINS;  Surgeon: Autumn Messing III, MD;  Location: [place];  Service: General;  Laterality: Left;     OB History   No obstetric history on file.     History reviewed. No pertinent family  history.  Social History   Tobacco Use  . Smoking status: Never Smoker  . Smokeless tobacco: Never Used  Vaping Use  . Vaping Use: Never used  Substance Use Topics  . Alcohol use: No  . Drug use: No    Home Medications Prior to Admission medications   Medication Sig Start Date End Date Taking? Authorizing Provider  anastrozole (ARIMIDEX) 1 MG tablet Take 1 tablet (1 mg total) by mouth daily. 07/24/16   Nicholas Lose, MD  anastrozole (ARIMIDEX) 1 MG tablet TAKE ONE TABLET BY MOUTH EVERY DAY 03/31/19   Nicholas Lose, MD  Ascorbic Acid (VITAMIN C) 100 MG tablet Take 100 mg by mouth daily.    [provider]  aspirin 81 MG tablet Take 81 mg by mouth daily.    [provider]  benazepril (LOTENSIN) 20 MG tablet  10/25/15   [provider]  Calcium Carbonate Antacid (TUMS PO) Take by mouth.    [provider]  Calcium-Vitamin D-Vitamin K (VIACTIV PO) Take by mouth.    [provider]  ibuprofen (ADVIL,MOTRIN) 200 MG tablet Take 200 mg by mouth every 6 (six) hours as needed.    [provider]  LUMIGAN 0.01 % SOLN  10/25/15   [provider]  Omega-3 Fatty Acids (FISH OIL PO) Take 600 mg by mouth.    [provider]  Red Yeast Rice Extract (RED YEAST RICE PO) Take 2 tablets by mouth.    [provider]  vitamin E 100 UNIT capsule Take by mouth daily.    [provider]    Allergies    Patient has no known allergies.  Review of Systems   Review of Systems  All other systems reviewed and are negative.   Physical Exam Updated Vital Signs BP (!) 157/61   Pulse 88   Temp 98 F (36.7 C) (Oral)   Resp 18   Ht 1.524 m (5')   Wt 63.5 kg   SpO2 98%   BMI 27.34 kg/m   Physical Exam Vitals and nursing note reviewed.  Constitutional:      General: She is not in acute distress.    Appearance: She is well-developed.  HENT:     Head: Normocephalic and atraumatic.     Right Ear: External ear  normal.     Left Ear: External ear normal.  Eyes:     General: No scleral icterus.       Right eye: No discharge.        Left eye: No discharge.     Conjunctiva/sclera: Conjunctivae normal.  Neck:     Trachea: No tracheal deviation.  Cardiovascular:     Rate and Rhythm: Normal rate and regular rhythm.  Pulmonary:     Effort: Pulmonary effort is normal. No respiratory distress.     Breath sounds: Normal breath sounds. No stridor. No wheezing or rales.  Abdominal:     General: Bowel sounds are normal. There is no distension.     Palpations: Abdomen is soft.     Tenderness: There is no abdominal tenderness. There is no guarding or rebound.  Musculoskeletal:        General: Swelling present. No tenderness.     Cervical back: Neck supple.     Right lower leg: Edema present.     Comments: Erythema and edema with increased warmth to the right lower extremity and involving her foot and lower leg below the knee, some evidence of skin breakdown on her right heel  Skin:    General: Skin is warm and dry.     Findings: No rash.  Neurological:     Mental Status: She is alert.     Cranial Nerves: No cranial nerve deficit (no facial droop, extraocular movements intact, no slurred speech).     Sensory: No sensory deficit.     Motor: No abnormal muscle tone or seizure activity.     Coordination: Coordination normal.     ED Results / Procedures / Treatments   Labs (all labs ordered are listed, but only abnormal results are displayed) Labs Reviewed  CULTURE, BLOOD (ROUTINE X 2)  CULTURE, BLOOD (ROUTINE X 2)  CBC WITH DIFFERENTIAL/PLATELET  BASIC METABOLIC PANEL  LACTIC ACID, PLASMA  LACTIC ACID, PLASMA    EKG None  Radiology No results found.  Procedures Procedures   Medications Ordered in ED Medications  Ampicillin-Sulbactam (UNASYN) 3 g in sodium chloride 0.9 % 100 mL IVPB (has no administration in time range)    ED Course  I have reviewed the triage vital signs and the  nursing notes.  Pertinent labs & imaging results that were available during my care of the patient were reviewed by me and considered in my medical decision making (see chart for details).    MDM Rules/Calculators/A&P  Pt presents with exam findings consistent with cellulitis.  Sent by wound care to be admitted for IV ABx.  Non toxic appearing. IV abx ordered. Labs ordered.  Pending at time of shift. Change.  Care turned over to Dr Dolly Rias Final Clinical Impression(s) / ED Diagnoses Final diagnoses:  Cellulitis of right lower extremity    Rx / DC Orders ED Discharge Orders    None       Dorie Rank, MD 07/15/20 1110

## 2020-07-14 NOTE — Progress Notes (Addendum)
Erin, [name] ([id number]) Visit Report for 07/14/2020 Chief Complaint Document Details Patient Name: Date of Service: Erin [name] [name] 07/14/2020 1:45 PM Medical Record Number: [id number] Patient Account Number: [id number] Date of Birth/Sex: Treating RN: [birth year] (87 y.o. Erin [name] Primary Care Provider: [name] Other Clinician: Referring Provider: Treating Provider/Extender: [name] in Treatment: 33 Information Obtained from: Patient Chief Complaint 03/22/17; patient is here for review of wounds on her bilateral legs 4/11; right lower extremity swelling and erythema, small wounds to the medial lower right leg Electronic Signature(s) Signed: 07/14/2020 2:47:32 PM By: Worthy Keeler PA-C Entered By: Worthy Keeler on 07/14/2020 14:47:32 -------------------------------------------------------------------------------- HPI Details Patient Name: Date of Service: Erin [name] [name] 07/14/2020 1:45 PM Medical Record Number: [id number] Patient Account Number: [id number] Date of Birth/Sex: Treating RN: [birth year] (87 y.o. Erin [name] Primary Care Provider: [name] Other Clinician: Referring Provider: Treating Provider/Extender: [name] in Treatment: 66 History of Present Illness HPI Description: We to find a new wound on the left 03/22/17; this is an 87 year old woman. There is not a lot of information in care everywhere on this patient. She had a history of a malignant neoplasm her left breast for which she had lumpectomy but she did not have radiation. Apparently she has had long- standing edema and her lower legs and she was seen in the wound care center at [facility] in [place] by [name] October. Both her legs were wrapped however it sounds as though the left leg became secondarily infected she apparently had MRSA and she was admitted the hospital. Not really turn for wound care.  She lives at home on her own and has apparently [facility] home health care. We are not exactly clear what [facility] is doing to her legs but it does not involve compression. She is not a diabetic. ABIs in our clinic were noncompressible bilaterally 03/30/17; this is a patient who has bilateral chronic venous insufficiency, severe venous inflammation. We had a nice description number legs from a note by [name] dating 2010 describing edematous warm legs with erythema. This suggests that she has chronic venous insufficiency with inflammation.. We have arterial studies dated 12/29/16 again from [facility]. This showed a right ABI at 1 and a TBI of 0.81 on the left the ABI was 1.25 and a TBI of 0.72. This was just she does not have significant arterial insufficiency. She had normal triphasic waveforms noted that she was admitted to hospital in late October with MSSA cellulitis Last visit we wrapped both her legs. She had an open area predominantly on the left anterior but weeping edema on the right anterior leg. She has done well there is only one at anterior left tibial wound 04/06/17; the patient has no open area on her right leg. Her remaining wound is on the left anterior leg, the left posterior leg wound is healed. We have been using silver alginate changed to [facility] on the left leg today she has home health 04/13/17; still no open area on her right leg. Her remaining wound on the left anterior leg. We have been using using Hydrofera Blue 04/20/17; no open area on the right leg although the edema here is somewhat disfiguring. Her remaining wound is on the left anterior leg we've been using Hydrofera Blue with improvement in dimensions. She has [facility] home health and lives in [place]. She requests to come every 2  weeks because of the distance involved in coming here 05/04/17; there is no open area on the right leg. She has a very small left anterior leg wound that remains.  However what remains on the left still has some depth and a not to viable looking surface. It is too small to really attempt debridement. She has home health coming out to her home in [facility] 05/18/17; there is no open area on either leg. She has significant chronic venous insufficiency. The left anterior leg wound that was still open 2 weeks ago has closed. She has home health coming out. We are going to order Juxtalite stockings for both legs. She is agreed to pay for these privately. We will order them from [facility] READMISSION 04/14/2019 This is a patient we previously had in this clinic in 2019 for 3 months. She had a wound on the left anterior greater than right anterior leg. According to my notes we discharged her and juxta lite stockings although it does not appear that she ever got them. The history here is a bit difficult. She apparently has had a wound on the left lower leg for the last 6 months. She required admission to hospital in [facility] for apparent cellulitis just before Christmas was discharged home she has an open wound. She has a home health agency although we are not exactly sure which one. They are applying silver alginate. No compression Past medical history is reviewed she has chronic venous insufficiency and a history of left breast CA. 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 She did not have any compression on the wounds today. 2/8; substantial wound on the right posterior calf and a smaller area on the left medial calf. We are using Iodoflex to both wound areas under compression. We have better edema control We to find a new wound on the left anterior medial calf 2/15; the area on the left medial calf is closed over. Substantial area on the right posterior and lateral calf. We have been using Iodoflex although the patient complains of pain and  wonders if there is not an alternative. She has home health changing the dressing twice a week and she sees [place] once 2/22; the wound on the left medial calf is closed over. Her edema control is not good here but she cannot get on stockings. This substantial wound is on the right lateral and right posterior calf. Changed her to a hydrofera Blue last week. Gradual improvement in wound area. 3/8; wound on the left leg remains closed. The substantial wound is on the right lateral and right posterior calf. This is measuring smaller 3/22; 2-week follow-up. Substantial area on the right posterior and lateral calf measures slightly smaller. We are using Hydrofera Blue under compression 4/5; 2-week follow-up. Substantial area on the right posterior lateral calf. This measures slightly smaller. She has islands of epithelialization we have been using Hydrofera Blue under compression 4/19; 2-week follow-up. We have been using Hydrofera Blue however there is maceration around the wound. I will change to silver alginate today. She has islands of epithelialization however I did not see much evidence that things were improving here. 5/6; 2-week follow-up. I put silver alginate on this because of maceration around the wound last time. However she comes in today with a lot of very adherent debris on the wound of the right lateral leg.  5/17; 2-week follow-up. Using Hydrofera Blue under compression. The patient has epithelialization including islands of epithelialization in the middle of fairly substantial wound area on the right posterior lateral calf 6/1; 2-week follow-up. Using Hydrofera Blue under compression. She seems to have increasing epithelialization. This is an irregular wound going from medial to lateral posteriorly in the right posterior calf. This would not be easy wound to get reproducible measurement 08/25/2019 upon evaluation today patient appears to actually be doing better in regard to the wound on her  leg at this point. We have been using Adaptic followed by [name] this is keeping her from getting stuck. The patient is aware that this is also potentially slowing down her healing simply due to the fact that the [facility] not in direct contact with the wound bed. Nonetheless she was not happy with the way it was sticking previous which is why we switched to this according to what I can read in the note and hear from the patient and her aide with her today. 6/28; 2-week follow-up. We are following this patient for severe wound on the right lower leg secondary to chronic venous insufficiency using Hydrofera Blue under compression we are making gradual improvements. She arrives in clinic today with what looks like a contusion on the left anterior mid tibia. She says she was washing her leg and pulled some skin. However there is a fairly large wound with surrounding erythema and some swelling 7/12; 2-week follow-up. Right lower leg wound not quite as good as I thought. There is still islands of epithelialization we are using Hydrofera Blue with a contact layer I would like to see if we can get rid of the contact layer at this point. The new area from last time is a lot smaller She did not take the cephalexin I initially prescribed for the erythema edema around the left leg due to fears about diarrhea. She did tolerate doxycycline that we subsequently called in 7/26; we are doing 2-week follow-up. Wound measurements are about the same. Still the same fibrinous debris we have been using Hydrofera Blue. Change to Iodoflex today. 8/17; been using Iodoflex. Some improvement in the surface area of the large wound on the right posterior calf left anterior actually slightly larger. 100% [name] covered although it is less adherent 9/seven 3-week follow-up. Been using Iodoflex. We still have continued improvement in the surface area of the large wound on the right calf. The small area on the left  anteriorly still is not closed. 01/14/2020 on evaluation today patient appears to be doing decently well in regard to her leg ulcer. Fortunately there is no signs of active infection at this time. With that being said the wound on the right leg laterally is still open and given her trouble. Fortunately there is no signs of active infection at this time. No fevers, chills, nausea, vomiting, or diarrhea. 11/18; I have not seen this woman in quite some time. She told me she had Covid in September. She has chronic venous insufficiency with lymphedema and initially had wounds on her right lateral and left anterior lower legs. The left anterior leg is closed she is wearing a stocking 02/11/2020 upon evaluation today patient appears to be doing well with regard to her leg ulcer. This appears to be somewhat dry which is good although I think the Iodoflex is probably keeping this much too dry at this point. I think it may be time to switch to something different since it has accomplish  the goal and we were kind of looking for. Fortunately there is no signs of active infection at this time. 02/25/2020 on evaluation today patient appears to be doing well with regard to her wound. She has been tolerating the dressing changes without complication. Fortunately there is no signs of active infection at this time. No fevers, chills, nausea, vomiting, or diarrhea. 03/17/2020 upon evaluation today patient's wound on the right leg seems to be doing quite well. I am very pleased with where things stand today. There does not appear to be any evidence of infection and overall I think she is managing quite nicely at this point. She is pleased. With that being said she does have some swelling of the left leg which is quite significant and again she has not really been checked for a possibility of a blood clot I think we may need to check a venous Doppler study. 03/24/2020 upon evaluation today patient appears to be doing quite  well with regard to her wounds currently. Fortunately there is no signs of active infection at this time. No fever or chills noted. With that being said she still has a lot of edema of the left leg the DVT study was negative which is great news. I am very pleased in that regard. With that being said I do believe that the patient likely needs compression stockings in this area and to make sure that she keeps edema to control so she does not end up with issues. As well. Fortunately there is no signs of active infection at this time. 04/07/2020 on evaluation today patient appears to be doing okay in regard to her wound on the right lateral leg although the wrap hospital by the nurse yesterday apparently is not doing as well as we would like to see. It caused some kind of injury outside of exactly where the wound is and this again is new and unfortunate. With that being said there does not appear to be any signs of active infection which is great news. No fever chills noted 04/14/2020 upon evaluation today patient appears to be doing decently well in regard to her wounds. She has been tolerating the dressing changes without complication. Fortunately there does not appear to be any evidence of active infection at this time. No fevers, chills, nausea, vomiting, or diarrhea. She did well with the wraps staying on for the week and they state up no problems. With that being said she tells me that she just cannot come in every week here to the clinic. For that reason she wants [place] to get the home health nurses to come back out to see her. I can definitely do that but I am not going to continue to allow them to come out to see her if the wrap is not done appropriately. For that reason I do want her to make sure that it is done correctly. If it is not then we can need to just bring her in here weekly to make sure it is until we get this wound healed. 05/05/2020 upon evaluation today patient actually appears to be  completely healed which is great news. Overall I am extremely pleased with where things stand today. I see no signs of active infection at this point. 06/09/2020 patient presents for reevaluation here in the clinic she was actually seen last February 23 of this year. That is when she discharged this healed. Nonetheless based on what I am seeing right now it appears that she has cellulitis of  the right lower extremity left lower extremity actually appears to be doing quite well and I think the compression wraps are doing a good job for her. There is no signs of active infection on this side. 4/4; patient patient returns to clinic. Most of the wounds in the right leg are completely healed and/or epithelialized however some of them look vulnerable. She still has erythema limited to the distal part of her foot but there is no tenderness here. She is not a diabetic. She is completing her antibiotics. Edema control in the right leg is good 4/11; Patient presents today with increased redness and swelling to the distal portion of her right foot. Per nursing staff this is worsened compared to last clinic visit. Patient states she is taking Bactrim however not tolerating it well and is not taking the medication at times. She denies pain currently. The wrap on the foot had rolled up over the past week. She currently denies fever/chills, nausea/vomiting or drainage from her wounds. She states that the wounds to her lower extremity have gotten smaller. 06/30/2020 upon evaluation today patient appears to be doing well with regard to her wound. She has been tolerating the dressing changes without complication. Fortunately there is no signs of active infection at this time. No fevers, chills, nausea, vomiting, or diarrhea. With that being said she does have some redness on the tip of her foot but again this is right where the wrap ended and the toes and end of her foot began. I think this is more due to the wrap  being kind of tight in that regard. Fortunately there does not appear to be any signs of infection honestly although we may go ahead and we extend her antibiotic for her for a bit longer just to make sure that she is covered in that regard since she will not be able to come back for 2 weeks. 07/14/2020 upon evaluation today patient appears to be doing quite a bit worse in regard to her leg on the right. Unfortunately this appears to be significantly infected based on what I am seeing at this point in time. There is not appear to be any signs of systemic infection which is good news. No fevers, chills, nausea, vomiting, or diarrhea. With that being said locally I am definitely seeing erythema and redness all the way from the toes up to just below the knee on the right leg this is despite having been using her [place] wraps. I do believe that this is indeed infected. The patient felt like that she may be having allergic reaction to the wraps but her left leg is completely unaffected. With that being said I think she does need to go to the hospital for further evaluation and treatment. Electronic Signature(s) Signed: 07/14/2020 2:58:28 PM By: Worthy Keeler PA-C Entered By: Worthy Keeler on 07/14/2020 14:58:28 -------------------------------------------------------------------------------- Physical Exam Details Patient Name: Date of Service: Erin [name] [name] 07/14/2020 1:45 PM Medical Record Number: [id number] Patient Account Number: [id number] Date of Birth/Sex: Treating RN: 1930-07-28 (87 y.o. Erin [name] Primary Care Provider: [name] Other Clinician: Referring Provider: Treating Provider/Extender: [name] in Treatment: 18 Constitutional Well-nourished and well-hydrated in no acute distress. Ears, Nose, Mouth, and Throat patient has hearing loss. Respiratory normal breathing without difficulty. Psychiatric this patient is able to make  decisions and demonstrates good insight into disease process. Alert and Oriented x 3. pleasant and cooperative. Notes Upon inspection patient's wound bed  actually showed signs of really no significant open wounds though she is having significant weeping and cellulitis noted of the right leg at this point. I do believe that she needs to be at the hospital as soon as possible. This patient is going to likely require IV antibiotic therapy. She also is severely hard of hearing and therefore will need somebody with her in order to help with keeping track of what is going on. She otherwise does not have any significant dementia or she can make decisions for self she just needs to be able to hear and therefore does require assistance. Electronic Signature(s) Signed: 07/14/2020 2:59:10 PM By: Worthy Keeler PA-C Entered By: Worthy Keeler on 07/14/2020 14:59:10 -------------------------------------------------------------------------------- Physician Orders Details Patient Name: Date of Service: Erin [name] [name] 07/14/2020 1:45 PM Medical Record Number: [id number] Patient Account Number: [id number] Date of Birth/Sex: Treating RN: 30-Jun-1930 (87 y.o. 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 Edema Control - Lymphedema / SCD / Other Bilateral Lower Extremities Elevate legs to the level of the heart or above for 30 minutes daily and/or when sitting, a frequency of: - throughout the day Avoid standing for long periods of time. Exercise regularly Compression stocking or Garment 20-30 mm/Hg pressure to: - farrow wrap to both legs daily, apply in the morning and remove at night Non Wound Condition pply the following to affected area as directed: - wrap right leg with ABDs, zetuvit and kerlix A Electronic Signature(s) Signed: 07/14/2020 6:39:46 PM By: Worthy Keeler PA-C Signed: 07/14/2020 7:02:06 PM By: [name] RN, BSN Entered By: [name] on 07/14/2020 14:56:33 -------------------------------------------------------------------------------- Problem List Details Patient Name: Date of Service: Erin [name] [name] 07/14/2020 1:45 PM Medical Record Number: [id number] Patient Account Number: [id number] Date of Birth/Sex: Treating RN: 03/13/1931 (87 y.o. Erin [name] Primary Care Provider: [name] Other Clinician: Referring Provider: Treating Provider/Extender: [name] in Treatment: 34 Active Problems ICD-10 Encounter Code Description Active Date MDM Diagnosis L03.115 Cellulitis of right lower limb 06/21/2020 No Yes I87.331 Chronic venous hypertension (idiopathic) with ulcer and inflammation of right 04/14/2019 No Yes lower extremity L97.812 Non-pressure chronic ulcer of other part of right lower leg with fat layer 04/14/2019 No Yes exposed I89.0 Lymphedema, not elsewhere classified 04/14/2019 No Yes Inactive Problems ICD-10 Code Description Active Date Inactive Date L97.822 Non-pressure chronic ulcer of other part of left lower leg with fat layer exposed 09/08/2019 09/08/2019 L97.821 Non-pressure chronic ulcer of other part of left lower leg limited to breakdown of skin 04/21/2019 04/21/2019 L03.116  Cellulitis of left lower limb 09/08/2019 09/08/2019 Resolved Problems Electronic Signature(s) Signed: 07/14/2020 2:47:26 PM By: Worthy Keeler PA-C Entered By: Worthy Keeler on 07/14/2020 14:47:26 -------------------------------------------------------------------------------- Progress Note Details Patient Name: Date of Service: Erin [name] [name] 07/14/2020 1:45 PM Medical Record Number:  [id number] Patient Account Number: [id number] Date of Birth/Sex: Treating RN: May 02, 1930 (87 y.o. Erin [name] Primary Care Provider: [name] Other Clinician: Referring Provider: Treating Provider/Extender: [name] in Treatment: 37 Subjective Chief Complaint Information obtained from Patient 03/22/17; patient is here for review of wounds on her bilateral legs 4/11; right lower extremity swelling and erythema, small wounds to the medial lower right leg History of Present Illness (HPI) We to find a new wound on the left 03/22/17; this is an 87 year old woman. There is not a lot of information in care everywhere on this patient. She had a history of a malignant neoplasm her left breast for which she had lumpectomy but she did not have radiation. Apparently she has had long-standing edema and her lower legs and she was seen in the wound care center at [facility] in [place] by [name] October. Both her legs were wrapped however it sounds as though the left leg became secondarily infected she apparently had MRSA and she was admitted the hospital. Not really turn for wound care. She lives at home on her own and has apparently [facility] home health care. We are not exactly clear what [facility] is doing to her legs but it does not involve compression. She is not a diabetic. ABIs in our clinic were noncompressible bilaterally 03/30/17; this is a patient who has bilateral chronic venous insufficiency, severe venous inflammation. We had a nice description number legs from  a note by [name] dating 2010 describing edematous warm legs with erythema. This suggests that she has chronic venous insufficiency with inflammation.. We have arterial studies dated 12/29/16 again from [facility]. This showed a right ABI at 1 and a TBI of 0.81 on the left the ABI was 1.25 and a TBI of 0.72. This was just she does not have significant arterial insufficiency. She had normal triphasic waveforms noted that she was admitted to hospital in late October with MSSA cellulitis Last visit we wrapped both her legs. She had an open area predominantly on the left anterior but weeping edema on the right anterior leg. She has done well there is only one at anterior left tibial wound 04/06/17; the patient has no open area on her right leg. Her remaining wound is on the left anterior leg, the left posterior leg wound is healed. We have been using silver alginate changed to [facility] on the left leg today she has home health 04/13/17; still no open area on her right leg. Her remaining wound on the left anterior leg. We have been using using Hydrofera Blue 04/20/17; no open area on the right leg although the edema here is somewhat disfiguring. Her remaining wound is on the left anterior leg we've been using Hydrofera Blue with improvement in dimensions. She has [facility] home health and lives in [place]. She requests to come every 2 weeks because of the distance involved in coming here 05/04/17; there is no open area on the right leg. She has a very small left anterior leg wound that remains. However what remains on the left still has some depth and a not to viable looking surface. It is too small to really attempt debridement. She has home health coming out to her home in [facility] 05/18/17; there is no open area on either leg. She has significant chronic venous insufficiency. The left anterior leg wound that was still open 2 weeks ago has closed. She has home health coming out. We are  going  to order Juxtalite stockings for both legs. She is agreed to pay for these privately. We will order them from [facility] READMISSION 04/14/2019 This is a patient we previously had in this clinic in 2019 for 3 months. She had a wound on the left anterior greater than right anterior leg. According to my notes we discharged her and juxta lite stockings although it does not appear that she ever got them. The history here is a bit difficult. She apparently has had a wound on the left lower leg for the last 6 months. She required admission to hospital in [facility] for apparent cellulitis just before Christmas was discharged home she has an open wound. She has a home health agency although we are not exactly sure which one. They are applying silver alginate. No compression Past medical history is reviewed she has chronic venous insufficiency and a history of left breast CA. 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 She did not have any compression on the wounds today. 2/8; substantial wound on the right posterior calf and a smaller area on the left medial calf. We are using Iodoflex to both wound areas under compression. We have better edema control We to find a new wound on the left anterior medial calf 2/15; the area on the left medial calf is closed over. Substantial area on the right posterior and lateral calf. We have been using Iodoflex although the patient complains of pain and wonders if there is not an alternative. She has home health changing the dressing twice a week and she sees [place] once 2/22; the wound on the left medial calf is closed over. Her edema control is not good here but she cannot get on stockings. This substantial wound is on the right lateral and right posterior calf. Changed her to a hydrofera Blue last week. Gradual improvement in wound area. 3/8; wound  on the left leg remains closed. The substantial wound is on the right lateral and right posterior calf. This is measuring smaller 3/22; 2-week follow-up. Substantial area on the right posterior and lateral calf measures slightly smaller. We are using Hydrofera Blue under compression 4/5; 2-week follow-up. Substantial area on the right posterior lateral calf. This measures slightly smaller. She has islands of epithelialization we have been using Hydrofera Blue under compression 4/19; 2-week follow-up. We have been using Hydrofera Blue however there is maceration around the wound. I will change to silver alginate today. She has islands of epithelialization however I did not see much evidence that things were improving here. 5/6; 2-week follow-up. I put silver alginate on this because of maceration around the wound last time. However she comes in today with a lot of very adherent debris on the wound of the right lateral leg. 5/17; 2-week follow-up. Using Hydrofera Blue under compression. The patient has epithelialization including islands of epithelialization in the middle of fairly substantial wound area on the right posterior lateral calf 6/1; 2-week follow-up. Using Hydrofera Blue under compression. She seems to have increasing epithelialization. This is an irregular wound going from medial to lateral posteriorly in the right posterior calf. This would not be easy wound to get reproducible measurement 08/25/2019 upon evaluation today patient appears to actually be doing better in regard to the wound on her leg at this point. We have been using Adaptic followed by Chrys Racer this is keeping her  from getting stuck. The patient is aware that this is also potentially slowing down her healing simply due to the fact that the [facility] not in direct contact with the wound bed. Nonetheless she was not happy with the way it was sticking previous which is why we switched to this according to what I can  read in the note and hear from the patient and her aide with her today. 6/28; 2-week follow-up. We are following this patient for severe wound on the right lower leg secondary to chronic venous insufficiency using Hydrofera Blue under compression we are making gradual improvements. She arrives in clinic today with what looks like a contusion on the left anterior mid tibia. She says she was washing her leg and pulled some skin. However there is a fairly large wound with surrounding erythema and some swelling 7/12; 2-week follow-up. Right lower leg wound not quite as good as I thought. There is still islands of epithelialization we are using Hydrofera Blue with a contact layer I would like to see if we can get rid of the contact layer at this point. ooThe new area from last time is a lot smaller ooShe did not take the cephalexin I initially prescribed for the erythema edema around the left leg due to fears about diarrhea. She did tolerate doxycycline that we subsequently called in 7/26; we are doing 2-week follow-up. Wound measurements are about the same. Still the same fibrinous debris we have been using Hydrofera Blue. Change to Iodoflex today. 8/17; been using Iodoflex. Some improvement in the surface area of the large wound on the right posterior calf left anterior actually slightly larger. 100% [name] covered although it is less adherent 9/seven 3-week follow-up. Been using Iodoflex. We still have continued improvement in the surface area of the large wound on the right calf. The small area on the left anteriorly still is not closed. 01/14/2020 on evaluation today patient appears to be doing decently well in regard to her leg ulcer. Fortunately there is no signs of active infection at this time. With that being said the wound on the right leg laterally is still open and given her trouble. Fortunately there is no signs of active infection at this time. No fevers, chills, nausea, vomiting, or  diarrhea. 11/18; I have not seen this woman in quite some time. She told me she had Covid in September. She has chronic venous insufficiency with lymphedema and initially had wounds on her right lateral and left anterior lower legs. The left anterior leg is closed she is wearing a stocking 02/11/2020 upon evaluation today patient appears to be doing well with regard to her leg ulcer. This appears to be somewhat dry which is good although I think the Iodoflex is probably keeping this much too dry at this point. I think it may be time to switch to something different since it has accomplish the goal and we were kind of looking for. Fortunately there is no signs of active infection at this time. 02/25/2020 on evaluation today patient appears to be doing well with regard to her wound. She has been tolerating the dressing changes without complication. Fortunately there is no signs of active infection at this time. No fevers, chills, nausea, vomiting, or diarrhea. 03/17/2020 upon evaluation today patient's wound on the right leg seems to be doing quite well. I am very pleased with where things stand today. There does not appear to be any evidence of infection and overall I think she is managing quite  nicely at this point. She is pleased. With that being said she does have some swelling of the left leg which is quite significant and again she has not really been checked for a possibility of a blood clot I think we may need to check a venous Doppler study. 03/24/2020 upon evaluation today patient appears to be doing quite well with regard to her wounds currently. Fortunately there is no signs of active infection at this time. No fever or chills noted. With that being said she still has a lot of edema of the left leg the DVT study was negative which is great news. I am very pleased in that regard. With that being said I do believe that the patient likely needs compression stockings in this area and to make sure  that she keeps edema to control so she does not end up with issues. As well. Fortunately there is no signs of active infection at this time. 04/07/2020 on evaluation today patient appears to be doing okay in regard to her wound on the right lateral leg although the wrap hospital by the nurse yesterday apparently is not doing as well as we would like to see. It caused some kind of injury outside of exactly where the wound is and this again is new and unfortunate. With that being said there does not appear to be any signs of active infection which is great news. No fever chills noted 04/14/2020 upon evaluation today patient appears to be doing decently well in regard to her wounds. She has been tolerating the dressing changes without complication. Fortunately there does not appear to be any evidence of active infection at this time. No fevers, chills, nausea, vomiting, or diarrhea. She did well with the wraps staying on for the week and they state up no problems. With that being said she tells me that she just cannot come in every week here to the clinic. For that reason she wants [place] to get the home health nurses to come back out to see her. I can definitely do that but I am not going to continue to allow them to come out to see her if the wrap is not done appropriately. For that reason I do want her to make sure that it is done correctly. If it is not then we can need to just bring her in here weekly to make sure it is until we get this wound healed. 05/05/2020 upon evaluation today patient actually appears to be completely healed which is great news. Overall I am extremely pleased with where things stand today. I see no signs of active infection at this point. 06/09/2020 patient presents for reevaluation here in the clinic she was actually seen last February 23 of this year. That is when she discharged this healed. Nonetheless based on what I am seeing right now it appears that she has cellulitis of the  right lower extremity left lower extremity actually appears to be doing quite well and I think the compression wraps are doing a good job for her. There is no signs of active infection on this side. 4/4; patient patient returns to clinic. Most of the wounds in the right leg are completely healed and/or epithelialized however some of them look vulnerable. She still has erythema limited to the distal part of her foot but there is no tenderness here. She is not a diabetic. She is completing her antibiotics. Edema control in the right leg is good 4/11; Patient presents today with increased redness  and swelling to the distal portion of her right foot. Per nursing staff this is worsened compared to last clinic visit. Patient states she is taking Bactrim however not tolerating it well and is not taking the medication at times. She denies pain currently. The wrap on the foot had rolled up over the past week. She currently denies fever/chills, nausea/vomiting or drainage from her wounds. She states that the wounds to her lower extremity have gotten smaller. 06/30/2020 upon evaluation today patient appears to be doing well with regard to her wound. She has been tolerating the dressing changes without complication. Fortunately there is no signs of active infection at this time. No fevers, chills, nausea, vomiting, or diarrhea. With that being said she does have some redness on the tip of her foot but again this is right where the wrap ended and the toes and end of her foot began. I think this is more due to the wrap being kind of tight in that regard. Fortunately there does not appear to be any signs of infection honestly although we may go ahead and we extend her antibiotic for her for a bit longer just to make sure that she is covered in that regard since she will not be able to come back for 2 weeks. 07/14/2020 upon evaluation today patient appears to be doing quite a bit worse in regard to her leg on the right.  Unfortunately this appears to be significantly infected based on what I am seeing at this point in time. There is not appear to be any signs of systemic infection which is good news. No fevers, chills, nausea, vomiting, or diarrhea. With that being said locally I am definitely seeing erythema and redness all the way from the toes up to just below the knee on the right leg this is despite having been using her [place] wraps. I do believe that this is indeed infected. The patient felt like that she may be having allergic reaction to the wraps but her left leg is completely unaffected. With that being said I think she does need to go to the hospital for further evaluation and treatment. Objective Constitutional Well-nourished and well-hydrated in no acute distress. Vitals Time Taken: 2:01 PM, Height: 60 in, Weight: 130 lbs, BMI: 25.4, Temperature: 98.2 F, Pulse: 81 bpm, Respiratory Rate: 17 breaths/min, Blood Pressure: 173/72 mmHg. Ears, Nose, Mouth, and Throat patient has hearing loss. Respiratory normal breathing without difficulty. Psychiatric this patient is able to make decisions and demonstrates good insight into disease process. Alert and Oriented x 3. pleasant and cooperative. General Notes: Upon inspection patient's wound bed actually showed signs of really no significant open wounds though she is having significant weeping and cellulitis noted of the right leg at this point. I do believe that she needs to be at the hospital as soon as possible. This patient is going to likely require IV antibiotic therapy. She also is severely hard of hearing and therefore will need somebody with her in order to help with keeping track of what is going on. She otherwise does not have any significant dementia or she can make decisions for self she just needs to be able to hear and therefore does require assistance. Other Condition(s) Patient presents with Cellulitis located on the Right Leg. General  Notes: edema, redness, odor, large amount of drainage weeping from leg. PA and case manager made aware. Assessment Active Problems ICD-10 Cellulitis of right lower limb Chronic venous hypertension (idiopathic) with ulcer and inflammation of right  lower extremity Non-pressure chronic ulcer of other part of right lower leg with fat layer exposed Lymphedema, not elsewhere classified Plan Follow-up Appointments: Return Appointment in: - once discharged from the hospital if needed Other: - Go to emergency room for evaluation and treatment of cellulitis of right leg Bathing/ Shower/ Hygiene: May shower and wash wound with soap and water. Edema Control - Lymphedema / SCD / Other: Elevate legs to the level of the heart or above for 30 minutes daily and/or when sitting, a frequency of: - throughout the day Avoid standing for long periods of time. Exercise regularly Compression stocking or Garment 20-30 mm/Hg pressure to: - farrow wrap to both legs daily, apply in the morning and remove at night Non Wound Condition: Apply the following to affected area as directed: - wrap right leg with ABDs, zetuvit and kerlix 1. Would recommend currently that we actually go ahead and have the patient go to the ER. She would prefer to go to [facility] which I am definitely okay with. With that being said I do think that she should go there as soon as she leaves here that is what they plan to do. 2. I do believe the patient is going require IV antibiotic therapy based on what I am seeing she has cellulitis with erythema and warmth noted from the toes up to just below the knee on the right leg and there appears to be something starting on the great toe of the left leg. This is definitely not an allergic reaction to the compression wraps. Her swelling does not appear to be too terrible in the left and the wrap seem to be doing a great job unfortunately on the right this is a completely different story. I think  this is infection and that is why she is not doing nearly as well. 3. I am also can recommend that the patient follow-up with [place] when she gets out of the hospital to ensure everything is okay. Obviously when that is will depend on how long she is in the hospital to get this under control. We will see patient back for reevaluation in 1 week here in the clinic. If anything worsens or changes patient will contact our office for additional recommendations. Electronic Signature(s) Signed: 07/14/2020 3:00:34 PM By: Worthy Keeler PA-C Entered By: Worthy Keeler on 07/14/2020 15:00:34 -------------------------------------------------------------------------------- SuperBill Details Patient Name: Date of Service: Erin [name] [name] 07/14/2020 Medical Record Number: [id number] Patient Account Number: [id number] Date of Birth/Sex: Treating RN: Aug 08, 1930 (86 y.o. [name], [name] Primary Care Provider: [name] Other Clinician: Referring Provider: Treating Provider/Extender: [name] in Treatment: 67 Diagnosis Coding ICD-10 Codes Code Description L03.115 Cellulitis of right lower limb I87.331 Chronic venous hypertension (idiopathic) with ulcer and inflammation of right lower extremity L97.812 Non-pressure chronic ulcer of other part of right lower leg with fat layer exposed I89.0 Lymphedema, not elsewhere classified Facility Procedures CPT4 Code: [id number] Description: 99213 - WOUND CARE VISIT-LEV 3 EST PT Modifier: Quantity: 1 Physician Procedures : CPT4 Code Description Modifier [id number] - WC PHYS LEVEL 4 - EST PT ICD-10 Diagnosis Description L03.115 Cellulitis of right lower limb I87.331 Chronic venous hypertension (idiopathic) with ulcer and inflammation of right lower extremity L97.812  Non-pressure chronic ulcer of other part of right lower leg with fat layer exposed I89.0 Lymphedema, not elsewhere classified Quantity: 1 Electronic  Signature(s) Signed: 07/14/2020 3:00:49 PM By: Worthy Keeler PA-C Entered By: Worthy Keeler  on 07/14/2020 15:00:49

## 2020-07-14 NOTE — ED Triage Notes (Signed)
Pt to er, nephew with pt, states she currently sees wound care in [place], states that they are here today because she is having more swelling and redness in her R leg, states that they told her to come to the er for evaluation of increasing infection.

## 2020-07-15 ENCOUNTER — Encounter (HOSPITAL_COMMUNITY): Payer: Self-pay | Admitting: Internal Medicine

## 2020-07-15 DIAGNOSIS — I10 Essential (primary) hypertension: Secondary | ICD-10-CM | POA: Diagnosis present

## 2020-07-15 DIAGNOSIS — L03115 Cellulitis of right lower limb: Secondary | ICD-10-CM | POA: Diagnosis not present

## 2020-07-15 LAB — LACTIC ACID, PLASMA: Lactic Acid, Venous: 1.4 mmol/L (ref 0.5–1.9)

## 2020-07-15 LAB — RESP PANEL BY RT-PCR (FLU A&B, COVID) ARPGX2
Influenza A by PCR: NEGATIVE
Influenza B by PCR: NEGATIVE
SARS Coronavirus 2 by RT PCR: NEGATIVE

## 2020-07-15 LAB — MRSA PCR SCREENING: MRSA by PCR: NEGATIVE

## 2020-07-15 MED ORDER — LACTATED RINGERS IV BOLUS
1000.0000 mL | Freq: Once | INTRAVENOUS | Status: AC
  Administered 2020-07-15: 1000 mL via INTRAVENOUS

## 2020-07-15 MED ORDER — DEXTROSE 5 % IV SOLN
1500.0000 mg | Freq: Once | INTRAVENOUS | Status: AC
  Administered 2020-07-15: 1500 mg via INTRAVENOUS
  Filled 2020-07-15: qty 75

## 2020-07-15 MED ORDER — IBUPROFEN 800 MG PO TABS
800.0000 mg | ORAL_TABLET | Freq: Once | ORAL | Status: AC
  Administered 2020-07-15: 800 mg via ORAL
  Filled 2020-07-15: qty 1

## 2020-07-15 NOTE — Care Management CC44 (Signed)
Condition Code 44 Documentation Completed  Patient Details  Name: AKASHA MELENA MRN: [id number] Date of Birth: 06/18/1930   Condition Code 44 given:  Yes Patient signature on Condition Code 44 notice:  Yes Documentation of 2 MD's agreement:  Yes Code 44 added to claim:  Yes    Ihor Gully, LCSW 07/15/2020, 11:28 AM

## 2020-07-15 NOTE — Care Management Obs Status (Signed)
[place] NOTIFICATION   Patient Details  Name: Erin [name] MRN: [id number] Date of Birth: 26-Oct-1930   Medicare Observation Status Notification Given:       Ihor Gully, LCSW 07/15/2020, 11:28 AM

## 2020-07-15 NOTE — ED Notes (Signed)
Gave pt breakfast tray

## 2020-07-15 NOTE — Consult Note (Signed)
Patient Demographics:    Erin [name], is a 87 y.o. female  MRN: [id number]   DOB - [birth year]  Admit Date - 07/14/2020  Outpatient Primary MD for the patient is Monico Blitz, MD   Assessment & Plan:    Principal Problem:   Cellulitis of right leg Active Problems:   Breast cancer of upper-inner quadrant of left female breast ([place])   HTN (hypertension)  1)Bil LE Cellulitis ---Rt > Lt ---does Not meet sepsis criteria -No fevers, no leukocytosis -No open wounds -Please see photos in epic -Patient was treated with IV DALVANCE  in the ED -Outpatient follow-up with ID physician Dr. Michel Bickers on 07/21/2020 advised -  2)Chronic Venous insufficiency--- patient has has chronic venous insufficiency -patient previously had  arterial studies dated 12/29/16 again from [facility]. This showed a right ABI at 1 and a TBI of 0.81 on the left the ABI was 1.25 and a TBI of 0.72. This was just she does not have significant arterial insufficiency. She had normal triphasic waveforms --- Okay to continue to follow-up with wound clinic after resolution of acute -pt was using Wallie Char wraps  3)Social--plan of care Discussed with Nephew   Disposition--home   With History of - Reviewed by me  Past Medical History:  Diagnosis Date  . Cancer [facility]) 2017   left breast cancer  . Cystitis   . HOH (hard of hearing)   . Hypertension       Past Surgical History:  Procedure Laterality Date  . ABDOMINAL HYSTERECTOMY     1974 - Dr. Gertie Fey  . BACK SURGERY    . BREAST LUMPECTOMY     left july 2017  . BREAST LUMPECTOMY WITH RADIOACTIVE SEED LOCALIZATION Left 10/07/2015   Procedure: LEFT BREAST LUMPECTOMY WITH RADIOACTIVE SEED LOCALIZATION;  Surgeon: Autumn Messing III, MD;  Location: [place];  Service:  General;  Laterality: Left;  LEFT BREAST LUMPECTOMY WITH RADIOACTIVE SEED LOCALIZATION  . BREAST SURGERY    . Lumbar spinal stenosis     1994 - Dr. Joya Salm  . RE-EXCISION OF BREAST CANCER,SUPERIOR MARGINS Left 11/22/2015   Procedure: RE-EXCISION OF LEFT BREAST CANCER, SUPERIOR MARGINS;  Surgeon: Autumn Messing III, MD;  Location: [place];  Service: General;  Laterality: Left;    No chief complaint on file.     HPI:    Erin [name]  is a 87 y.o. female with history of chronic lower extremity venous stasis/venous insufficiency, prior breast cancer and HTN presents to the ED after being sent from wound care center with concern for right lower extremity cellulitis- -extensive and well detailed documentation from wound center provider Mr. Jeri Cos reviewed -Additional history obtained from patient and patient's nephew No fever  Or chills  nausea, vomiting, diarrhea -- Lower extremity redness warmth and swelling worsened over the last several days -Patient has been using [place] wraps at home -Patient was evaluated in the ED Case discussed  with ID pharmacist--- -patient meets criteria for Dalvance antibiotic therapy with outpatient follow-up with infectious disease specialist -Please note the patient has no open wounds, no fevers or leukocytosis - Patient received IV Dalvance in the ED, she was monitored and observed -And subsequently discharged home from the ED-with outpatient ID physician follow-up    Review of systems:    In addition to the HPI above,   A full Review of  Systems was done, all other systems reviewed are negative except as noted above in HPI , .    Social History:  Reviewed by me    Social History   Tobacco Use  . Smoking status: Never Smoker  . Smokeless tobacco: Never Used  Substance Use Topics  . Alcohol use: No       Family History :  Reviewed by me  HTN   Home Medications:   Prior to Admission medications   Medication Sig  Start Date End Date Taking? Authorizing Provider  benazepril (LOTENSIN) 20 MG tablet Take 20 mg by mouth daily. 10/25/15  Yes [provider]  anastrozole (ARIMIDEX) 1 MG tablet Take 1 tablet (1 mg total) by mouth daily. 07/24/16   Nicholas Lose, MD  anastrozole (ARIMIDEX) 1 MG tablet TAKE ONE TABLET BY MOUTH EVERY DAY 03/31/19   Nicholas Lose, MD  Ascorbic Acid (VITAMIN C) 100 MG tablet Take 100 mg by mouth daily.    [provider]  aspirin 81 MG tablet Take 81 mg by mouth daily.    [provider]  Calcium Carbonate Antacid (TUMS PO) Take by mouth.    [provider]  Calcium-Vitamin D-Vitamin K (VIACTIV PO) Take by mouth.    [provider]  furosemide (LASIX) 20 MG tablet Take 20 mg by mouth daily. 05/28/20   [provider]  ibuprofen (ADVIL,MOTRIN) 200 MG tablet Take 200 mg by mouth every 6 (six) hours as needed.    [provider]  LUMIGAN 0.01 % SOLN Place 1 drop into both eyes at bedtime. 10/25/15   [provider]  Omega-3 Fatty Acids (FISH OIL PO) Take 600 mg by mouth.    [provider]  Red Yeast Rice Extract (RED YEAST RICE PO) Take 2 tablets by mouth.    [provider]  vitamin E 100 UNIT capsule Take by mouth daily.    [provider]     Allergies:    No Known Allergies   Physical Exam:   Vitals  Blood pressure (!) 114/47, pulse 83, temperature 98 F (36.7 C), temperature source Oral, resp. rate 19, height 5' (1.524 m), weight 63.5 kg, SpO2 95 %.  Physical Examination: General appearance - alert,   and in no distress an Mental status - alert, oriented to person, place, and time, Eyes - sclera anicteric Ears- Very HOH Neck - supple, no JVD elevation , Chest - clear  to auscultation bilaterally, symmetrical air movement,  Heart - S1 and S2 normal, regular  Abdomen - soft, nontender, nondistended,   Neurological - screening mental status exam normal, neck supple without  rigidity, cranial nerves II through XII intact, DTR's normal and symmetric -Generalized weakness without new focal deficits Extremities -   intact peripheral pulses  Skin-right lower extremity erythema , swelling and slight warmth, no open wounds, no drainage, no streaking  -please see photos in epic  Media Information         Document Information  Photos    07/15/2020 08:56  Attached To:  Hospital  Encounter on 07/14/20   [place], MD  [facility]     Media Information         Document Information  Photos    07/15/2020 08:56  Attached To:  Hospital Encounter on 07/14/20   Source Information  Roxan Hockey, MD  [facility]    Media Information         Document Information  Photos  Rt foot   07/15/2020 08:54  Attached To:  Hospital Encounter on 07/14/20   Source Information  Roxan Hockey, MD  [facility]       Data Review:    CBC Recent Labs  Lab 07/14/20 2253  WBC 5.4  HGB 10.5*  HCT 33.8*  PLT 272  MCV 88.3  MCH 27.4  MCHC 31.1  RDW 15.0  LYMPHSABS 0.9  MONOABS 0.6  EOSABS 0.0  BASOSABS 0.0   ------------------------------------------------------------------------------------------------------------------  Chemistries  Recent Labs  Lab 07/14/20 2253  NA 137  K 5.1  CL 105  CO2 23  GLUCOSE 146*  BUN 54*  CREATININE 1.34*  CALCIUM 9.4   ------------------------------------------------------------------------------------------------------------------ estimated creatinine clearance is 24.6 mL/min (A) (by C-G formula based on SCr of 1.34 mg/dL (H)). ------------------------------------------------------------------------------------------------------------------ No results for input(s): TSH, T4TOTAL, T3FREE, THYROIDAB in the last 72 hours.  Invalid input(s): FREET3   Coagulation profile No results for input(s): INR, PROTIME in the last 168  hours. ------------------------------------------------------------------------------------------------------------------- No results for input(s): DDIMER in the last 72 hours. -------------------------------------------------------------------------------------------------------------------  Cardiac Enzymes No results for input(s): CKMB, TROPONINI, MYOGLOBIN in the last 168 hours.  Invalid input(s): CK ------------------------------------------------------------------------------------------------------------------ No results found for: BNP   ---------------------------------------------------------------------------------------------------------------  Urinalysis No results found for: COLORURINE, APPEARANCEUR, LABSPEC, [place], GLUCOSEU, HGBUR, BILIRUBINUR, KETONESUR, PROTEINUR, UROBILINOGEN, NITRITE, LEUKOCYTESUR  ----------------------------------------------------------------------------------------------------------------   Imaging Results:    No results found.  Radiological Exams on Admission: No results found.  Family Communication:   patients condition and plan of care including tests being ordered have been discussed with the patient and Nephew who indicate understanding and agree with the plan   Code Status - Full Code  Likely DC to home   Condition   stable  Roxan Hockey M.D on 07/15/2020 at 10:57 AM Go to [email or web address] -  for contact info  [facility] - Office  [phone]

## 2020-07-16 NOTE — Progress Notes (Addendum)
Erin, [name] ([id number]) Visit Report for 07/14/2020 Arrival Information Details Patient Name: Date of Service: Erin [name] [name] 07/14/2020 1:45 PM Medical Record Number: [id number] Patient Account Number: [id number] Date of Birth/Sex: Treating RN: May 27, 1930 (87 y.o. [name] Primary Care Krystiana Fornes: Erin [name] Other Clinician: Referring Arley Salamone: Treating Erin [name]/Extender: [name] in Treatment: 15 Visit Information History Since Last Visit Added or deleted any medications: No Patient Arrived: Wheel Chair Any new allergies or adverse reactions: No Arrival Time: 14:01 Had a fall or experienced change in No Accompanied By: caregiver activities of daily living that may affect Transfer Assistance: Manual risk of falls: Patient Identification Verified: Yes Signs or symptoms of abuse/neglect since last visito No Secondary Verification Process Completed: Yes Hospitalized since last visit: No Patient Requires Transmission-Based Precautions: No Implantable device outside of the clinic excluding No Patient Has Alerts: Yes cellular tissue based products placed in the center Patient Alerts: NON COMPRESSABLE since last visit: Has Dressing in Place as Prescribed: Yes Pain Present Now: Yes Electronic Signature(s) Signed: 07/14/2020 7:02:06 PM By: [name] RN, BSN Entered By: [name] on 07/14/2020 14:15:33 -------------------------------------------------------------------------------- Clinic Level of Care Assessment Details Patient Name: Date of Service: Erin [name] [name] 07/14/2020 1:45 PM Medical Record Number: [id number] Patient Account Number: [id number] Date of Birth/Sex: Treating RN: 11-11-1930 (87 y.o. Erin [name], Erin Primary Care Jamare Vanatta: Erin [name] Other Clinician: Referring Erin [name]: Treating Erin [name]/Extender: [name] in Treatment: 109 Clinic Level of Care Assessment  Items TOOL 4 Quantity Score []  - 0 Use when only an EandM is performed on FOLLOW-UP visit ASSESSMENTS - Nursing Assessment / Reassessment X- 1 10 Reassessment of Co-morbidities (includes updates in patient status) X- 1 5 Reassessment of Adherence to Treatment Plan ASSESSMENTS - Wound and Skin A ssessment / Reassessment []  - 0 Simple Wound Assessment / Reassessment - one wound []  - 0 Complex Wound Assessment / Reassessment - multiple wounds X- 1 10 Dermatologic / Skin Assessment (not related to wound area) ASSESSMENTS - Focused Assessment X- 2 5 Circumferential Edema Measurements - multi extremities []  - 0 Nutritional Assessment / Counseling / Intervention X- 1 5 Lower Extremity Assessment (monofilament, tuning fork, pulses) []  - 0 Peripheral Arterial Disease Assessment (using hand held doppler) ASSESSMENTS - Ostomy and/or Continence Assessment and Care []  - 0 Incontinence Assessment and Management []  - 0 Ostomy Care Assessment and Management (repouching, etc.) PROCESS - Coordination of Care X - Simple Patient / Family Education for ongoing care 1 15 []  - 0 Complex (extensive) Patient / Family Education for ongoing care X- 1 10 Staff obtains Programmer, systems, Records, T Results / Process Orders est []  - 0 Staff telephones HHA, Nursing Homes / Clarify orders / etc []  - 0 Routine Transfer to another Facility (non-emergent condition) X- 1 10 Routine Hospital Admission (non-emergent condition) []  - 0 New Admissions / Biomedical engineer / Ordering NPWT Apligraf, etc. , []  - 0 Emergency Hospital Admission (emergent condition) X- 1 10 Simple Discharge Coordination []  - 0 Complex (extensive) Discharge Coordination PROCESS - Special Needs []  - 0 Pediatric / Minor Patient Management []  - 0 Isolation Patient Management []  - 0 Hearing / Language / Visual special needs []  - 0 Assessment of Community assistance (transportation, D/C planning, etc.) []  - 0 Additional  assistance / Altered mentation []  - 0 Support Surface(s) Assessment (bed, cushion, seat, etc.) INTERVENTIONS - Wound Cleansing / Measurement []  - 0 Simple Wound Cleansing - one wound []  -  0 Complex Wound Cleansing - multiple wounds []  - 0 Wound Imaging (photographs - any number of wounds) []  - 0 Wound Tracing (instead of photographs) []  - 0 Simple Wound Measurement - one wound []  - 0 Complex Wound Measurement - multiple wounds INTERVENTIONS - Wound Dressings []  - 0 Small Wound Dressing one or multiple wounds []  - 0 Medium Wound Dressing one or multiple wounds []  - 0 Large Wound Dressing one or multiple wounds []  - 0 Application of Medications - topical []  - 0 Application of Medications - injection INTERVENTIONS - Miscellaneous []  - 0 External ear exam []  - 0 Specimen Collection (cultures, biopsies, blood, body fluids, etc.) []  - 0 Specimen(s) / Culture(s) sent or taken to Lab for analysis []  - 0 Patient Transfer (multiple staff / Civil Service fast streamer / Similar devices) []  - 0 Simple Staple / Suture removal (25 or less) []  - 0 Complex Staple / Suture removal (26 or more) []  - 0 Hypo / Hyperglycemic Management (close monitor of Blood Glucose) []  - 0 Ankle / Brachial Index (ABI) - do not check if billed separately X- 1 5 Vital Signs Has the patient been seen at the hospital within the last three years: Yes Total Score: 90 Level Of Care: New/Established - Level 3 Electronic Signature(s) Signed: 07/14/2020 7:02:06 PM By: [name] RN, BSN Entered By: [name] on 07/14/2020 14:52:04 -------------------------------------------------------------------------------- Encounter Discharge Information Details Patient Name: Date of Service: Erin [name] [name] 07/14/2020 1:45 PM Medical Record Number: [id number] Patient Account Number: [id number] Date of Birth/Sex: Treating RN: Jan 25, 1931 (87 y.o. Erin [name], Erin Primary Care Erin [name]: Erin [name] Other  Clinician: Referring Erin [name]: Treating Erin [name]/Extender: [name] in Treatment: 71 Encounter Discharge Information Items Discharge Condition: Stable Ambulatory Status: Wheelchair Discharge Destination: Hospital Telephoned: No Orders Sent: Yes Transportation: Private Auto Accompanied By: [name] Schedule Follow-up Appointment: Yes Clinical Summary of Care: Patient Declined Electronic Signature(s) Signed: 07/16/2020 3:14:58 PM By: Rhae Hammock RN Entered By: Rhae Hammock on 07/14/2020 15:59:32 -------------------------------------------------------------------------------- Lower Extremity Assessment Details Patient Name: Date of Service: Erin [name] [name] 07/14/2020 1:45 PM Medical Record Number: [id number] Patient Account Number: [id number] Date of Birth/Sex: Treating RN: [birth year] (87 y.o. [name], Tammi Klippel Primary Care Annalicia Renfrew: Erin [name] Other Clinician: Referring Sabreena Vogan: Treating Rafan Sanders/Extender: Juleen Starr Weeks in Treatment: 65 Edema Assessment Assessed: [Left: No] [Right: Yes] Edema: [Left: Yes] [Right: Yes] Calf Left: Right: Point of Measurement: 35 cm From Medial Instep 40 cm Ankle Left: Right: Point of Measurement: 9 cm From Medial Instep 23 cm Vascular Assessment Pulses: Dorsalis Pedis Palpable: [Right:Yes] Notes right leg edematous, odor, weeping large amounts of serous fluid, and red from toes to knee. Per caregiver has been like this x1 week. Patient has not went to ED or urgent care came here. Per Caregiver family wants patient to go to hospital at [facility]. PA and case manager made aware. Electronic Signature(s) Signed: 07/14/2020 6:54:10 PM By: Deon Pilling Entered By: Deon Pilling on 07/14/2020 14:17:53 -------------------------------------------------------------------------------- [place] Details Patient Name: Date of Service: Erin [name] [name] 07/14/2020  1:45 PM Medical Record Number: [id number] Patient Account Number: [id number] Date of Birth/Sex: Treating RN: Oct 05, 1930 (87 y.o. [name] Primary Care Khyre Germond: Erin [name] Other Clinician: Referring Rigel Filsinger: Treating Zephyra Bernardi/Extender: [name] in Treatment: [place] reviewed with physician Active Inactive Electronic Signature(s) Signed: 10/01/2020 2:03:23 PM By: [name] RN, BSN Previous Signature: 07/14/2020 7:02:06  PM Version By: [name] RN, BSN Entered By: [name] on 09/01/2020 17:24:39 -------------------------------------------------------------------------------- Non-Wound Condition Assessment Details Patient Name: Date of Service: Erin [name] [name] 07/14/2020 1:45 PM Medical Record Number: [id number] Patient Account Number: [id number] Date of Birth/Sex: Treating RN: 10/06/1930 (86 y.o. [name] Primary Care Sully Manzi: Erin [name] Other Clinician: Referring Ltanya Bayley: Treating Panfilo Ketchum/Extender: [name] in Treatment: 69 Non-Wound Condition: Condition: Cellulitis Location: Leg Side: Right Notes edema, redness, odor, large amount of drainage weeping from leg. PA and case manager made aware. Electronic Signature(s) Signed: 07/14/2020 6:54:10 PM By: Deon Pilling Entered By: Deon Pilling on 07/14/2020 14:18:53 -------------------------------------------------------------------------------- Pain Assessment Details Patient Name: Date of Service: Erin [name] [name] 07/14/2020 1:45 PM Medical Record Number: [id number] Patient Account Number: [id number] Date of Birth/Sex: Treating RN: 1930-09-02 (87 y.o. [name] Primary Care Andreina Outten: Erin [name] Other Clinician: Referring Morton Simson: Treating Chrisy Hillebrand/Extender: [name] in Treatment: 5 Active Problems Location of Pain Severity and Description of  Pain Patient Has Paino Yes Site Locations Rate the pain. Current Pain Level: 5 Pain Management and Medication Current Pain Management: Electronic Signature(s) Signed: 07/14/2020 7:02:06 PM By: [name] RN, BSN Entered By: [name] on 07/14/2020 14:16:06 -------------------------------------------------------------------------------- Patient/Caregiver Education Details Patient Name: Date of Service: Erin [name] [name] 5/4/2022andnbsp1:45 PM Medical Record Number: [id number] Patient Account Number: [id number] Date of Birth/Gender: Treating RN: Dec 30, 1930 (87 y.o. [name] Primary Care Physician: Erin [name] Other Clinician: Referring Physician: Treating Physician/Extender: [name] in Treatment: 44 Education Assessment Education Provided To: Patient Education Topics Provided Infection: Methods: Explain/Verbal Responses: Reinforcements needed, State content correctly Venous: Methods: Explain/Verbal Responses: Reinforcements needed, State content correctly Electronic Signature(s) Signed: 07/14/2020 7:02:06 PM By: [name] RN, BSN Entered By: [name] on 07/14/2020 14:50:54 -------------------------------------------------------------------------------- [place] Details Patient Name: Date of Service: Erin [name] [name] 07/14/2020 1:45 PM Medical Record Number: [id number] Patient Account Number: [id number] Date of Birth/Sex: Treating RN: 17-Sep-1930 (87 y.o. [name] Primary Care Tremel Setters: Erin [name] Other Clinician: Referring Finnlee Silvernail: Treating Lyndi Holbein/Extender: [name] in Treatment: 46 Vital Signs Time Taken: 14:01 Temperature (F): 98.2 Height (in): 60 Pulse (bpm): 81 Weight (lbs): 130 Respiratory Rate (breaths/min): 17 Body Mass Index (BMI): 25.4 Blood Pressure (mmHg): 173/72 Reference Range: 80 - 120 mg / dl Electronic Signature(s) Signed:  07/14/2020 7:02:06 PM By: [name] RN, BSN Entered By: [name] on 07/14/2020 14:15:40

## 2020-07-16 NOTE — Progress Notes (Signed)
ELLISON, LEISURE ([id number]) Visit Report for 06/30/2020 Arrival Information Details Patient Name: Date of Service: Erin [name] [name] 06/30/2020 11:00 A M Medical Record Number: [id number] Patient Account Number: [id number] Date of Birth/Sex: Treating RN: 02/04/1931 (87 y.o. [name], [name] Primary Care Shamecka Hocutt: [name] Other Clinician: Referring Kyrin Gratz: Treating Aarohi Redditt/Extender: [name] in Treatment: 57 Visit Information History Since Last Visit Added or deleted any medications: No Patient Arrived: Wheel Chair Any new allergies or adverse reactions: No Arrival Time: 11:11 Had a fall or experienced change in No Accompanied By: cg activities of daily living that may affect Transfer Assistance: None risk of falls: Patient Identification Verified: Yes Signs or symptoms of abuse/neglect since last visito No Secondary Verification Process Completed: Yes Hospitalized since last visit: No Patient Requires Transmission-Based Precautions: No Implantable device outside of the clinic excluding No Patient Has Alerts: Yes cellular tissue based products placed in the center Patient Alerts: NON COMPRESSABLE since last visit: Has Dressing in Place as Prescribed: Yes Pain Present Now: No Electronic Signature(s) Signed: 07/16/2020 3:14:58 PM By: Rhae Hammock RN Entered By: Rhae Hammock on 06/30/2020 11:12:04 -------------------------------------------------------------------------------- Clinic Level of Care Assessment Details Patient Name: Date of Service: Erin [name] [name] 06/30/2020 11:00 A M Medical Record Number: [id number] Patient Account Number: [id number] Date of Birth/Sex: Treating RN: 27-Jul-1930 (87 y.o. [name], [name] Primary Care Sarann Tregre: [name] Other Clinician: Referring Tysheem Accardo: Treating Genene Kilman/Extender: [name] in Treatment: 63 Clinic Level of Care Assessment  Items TOOL 4 Quantity Score []  - 0 Use when only an EandM is performed on FOLLOW-UP visit ASSESSMENTS - Nursing Assessment / Reassessment X- 1 10 Reassessment of Co-morbidities (includes updates in patient status) X- 1 5 Reassessment of Adherence to Treatment Plan ASSESSMENTS - Wound and Skin A ssessment / Reassessment X - Simple Wound Assessment / Reassessment - one wound 1 5 []  - 0 Complex Wound Assessment / Reassessment - multiple wounds []  - 0 Dermatologic / Skin Assessment (not related to wound area) ASSESSMENTS - Focused Assessment X- 1 5 Circumferential Edema Measurements - multi extremities []  - 0 Nutritional Assessment / Counseling / Intervention X- 1 5 Lower Extremity Assessment (monofilament, tuning fork, pulses) []  - 0 Peripheral Arterial Disease Assessment (using hand held doppler) ASSESSMENTS - Ostomy and/or Continence Assessment and Care []  - 0 Incontinence Assessment and Management []  - 0 Ostomy Care Assessment and Management (repouching, etc.) PROCESS - Coordination of Care X - Simple Patient / Family Education for ongoing care 1 15 []  - 0 Complex (extensive) Patient / Family Education for ongoing care X- 1 10 Staff obtains Programmer, systems, Records, T Results / Process Orders est []  - 0 Staff telephones HHA, Nursing Homes / Clarify orders / etc []  - 0 Routine Transfer to another Facility (non-emergent condition) []  - 0 Routine Hospital Admission (non-emergent condition) []  - 0 New Admissions / Biomedical engineer / Ordering NPWT Apligraf, etc. , []  - 0 Emergency Hospital Admission (emergent condition) X- 1 10 Simple Discharge Coordination []  - 0 Complex (extensive) Discharge Coordination PROCESS - Special Needs []  - 0 Pediatric / Minor Patient Management []  - 0 Isolation Patient Management []  - 0 Hearing / Language / Visual special needs []  - 0 Assessment of Community assistance (transportation, D/C planning, etc.) []  - 0 Additional  assistance / Altered mentation []  - 0 Support Surface(s) Assessment (bed, cushion, seat, etc.) INTERVENTIONS - Wound Cleansing / Measurement X - Simple Wound Cleansing - one  wound 1 5 []  - 0 Complex Wound Cleansing - multiple wounds X- 1 5 Wound Imaging (photographs - any number of wounds) []  - 0 Wound Tracing (instead of photographs) []  - 0 Simple Wound Measurement - one wound []  - 0 Complex Wound Measurement - multiple wounds INTERVENTIONS - Wound Dressings []  - 0 Small Wound Dressing one or multiple wounds []  - 0 Medium Wound Dressing one or multiple wounds []  - 0 Large Wound Dressing one or multiple wounds []  - 0 Application of Medications - topical []  - 0 Application of Medications - injection INTERVENTIONS - Miscellaneous []  - 0 External ear exam []  - 0 Specimen Collection (cultures, biopsies, blood, body fluids, etc.) []  - 0 Specimen(s) / Culture(s) sent or taken to Lab for analysis []  - 0 Patient Transfer (multiple staff / Civil Service fast streamer / Similar devices) []  - 0 Simple Staple / Suture removal (25 or less) []  - 0 Complex Staple / Suture removal (26 or more) []  - 0 Hypo / Hyperglycemic Management (close monitor of Blood Glucose) []  - 0 Ankle / Brachial Index (ABI) - do not check if billed separately X- 1 5 Vital Signs Has the patient been seen at the hospital within the last three years: Yes Total Score: 80 Level Of Care: New/Established - Level 3 Electronic Signature(s) Signed: 06/30/2020 6:20:01 PM By: [name] RN, BSN Entered By: [name] on 06/30/2020 11:37:41 -------------------------------------------------------------------------------- Encounter Discharge Information Details Patient Name: Date of Service: Erin [name] [name] 06/30/2020 11:00 A M Medical Record Number: [id number] Patient Account Number: [id number] Date of Birth/Sex: Treating RN: 1930-05-16 (87 y.o. [name], [name] Primary Care Ramar Nobrega: [name] Other  Clinician: Referring Mala Gibbard: Treating Amaal Dimartino/Extender: [name] in Treatment: 21 Encounter Discharge Information Items Discharge Condition: Stable Ambulatory Status: Wheelchair Discharge Destination: Home Transportation: Private Auto Accompanied By: [name] Schedule Follow-up Appointment: Yes Clinical Summary of Care: Patient Declined Electronic Signature(s) Signed: 07/16/2020 3:14:58 PM By: Rhae Hammock RN Entered By: Rhae Hammock on 06/30/2020 11:51:09 -------------------------------------------------------------------------------- Lower Extremity Assessment Details Patient Name: Date of Service: Erin [name] [name] 06/30/2020 11:00 A M Medical Record Number: [id number] Patient Account Number: [id number] Date of Birth/Sex: Treating RN: Aug 16, 1930 (87 y.o. [name], [name] Primary Care Lorrie Strauch: [name] Other Clinician: Referring Dorthey Depace: Treating Ascher Schroepfer/Extender: [name] in Treatment: 63 Edema Assessment Assessed: [Left: No] [Right: Yes] Edema: [Left: Yes] [Right: Yes] Calf Left: Right: Point of Measurement: 35 cm From Medial Instep 37 cm Ankle Left: Right: Point of Measurement: 9 cm From Medial Instep 21 cm Vascular Assessment Pulses: Dorsalis Pedis Palpable: [Right:Yes] Posterior Tibial Palpable: [Right:Yes] Electronic Signature(s) Signed: 07/16/2020 3:14:58 PM By: Rhae Hammock RN Entered By: Rhae Hammock on 06/30/2020 11:25:18 -------------------------------------------------------------------------------- [place] Details Patient Name: Date of Service: Erin [name] [name] 06/30/2020 11:00 A M Medical Record Number: [id number] Patient Account Number: [id number] Date of Birth/Sex: Treating RN: January 02, 1931 (87 y.o. [name] Primary Care Becky Colan: [name] Other Clinician: Referring Nivaan Dicenzo: Treating Travious Vanover/Extender: [name] in Treatment: 62 Multidisciplinary Care Plan reviewed with physician Active Inactive Venous Leg Ulcer Nursing Diagnoses: Actual venous Insuffiency (use after diagnosis is confirmed) Knowledge deficit related to disease process and management Goals: Patient will maintain optimal edema control Date Initiated: 04/14/2019 Target Resolution Date: 07/07/2020 Goal Status: Active Patient/caregiver will verbalize understanding of disease process and disease management Date Initiated: 04/14/2019 Date Inactivated: 05/19/2019 Target Resolution Date: 05/16/2019 Goal Status:  Met Interventions: Assess peripheral edema status every visit. Compression as ordered Provide education on venous insufficiency Notes: Electronic Signature(s) Signed: 06/30/2020 6:20:01 PM By: [name] RN, BSN Entered By: [name] on 06/30/2020 11:15:56 -------------------------------------------------------------------------------- Pain Assessment Details Patient Name: Date of Service: Erin [name] [name] 06/30/2020 11:00 A M Medical Record Number: [id number] Patient Account Number: [id number] Date of Birth/Sex: Treating RN: [birth year] (87 y.o. [name], [name] Primary Care Tashawn Laswell: [name] Other Clinician: Referring Tabbetha Kutscher: Treating Keyandre Pileggi/Extender: [name] in Treatment: 39 Active Problems Location of Pain Severity and Description of Pain Patient Has Paino No Site Locations Rate the pain. Current Pain Level: 0 Pain Management and Medication Current Pain Management: Electronic Signature(s) Signed: 07/16/2020 3:14:58 PM By: Rhae Hammock RN Entered By: Rhae Hammock on 06/30/2020 11:13:30 -------------------------------------------------------------------------------- Patient/Caregiver Education Details Patient Name: Date of Service: Erin [name] [name] 4/20/2022andnbsp11:00 Ashley Record Number:  [id number] Patient Account Number: [id number] Date of Birth/Gender: Treating RN: 08-27-1930 (87 y.o. [name] Primary Care Physician: [name] Other Clinician: Referring Physician: Treating Physician/Extender: [name] in Treatment: 56 Education Assessment Education Provided To: Patient Education Topics Provided Venous: Methods: Explain/Verbal Responses: Reinforcements needed, State content correctly Wound/Skin Impairment: Methods: Explain/Verbal Responses: Reinforcements needed, State content correctly Electronic Signature(s) Signed: 06/30/2020 6:20:01 PM By: [name] RN, BSN Entered By: [name] on 06/30/2020 11:16:27 -------------------------------------------------------------------------------- Wound Assessment Details Patient Name: Date of Service: Erin [name] [name] 06/30/2020 11:00 A M Medical Record Number: [id number] Patient Account Number: [id number] Date of Birth/Sex: Treating RN: Sep 25, 1930 (87 y.o. [name], [name] Primary Care Nelda Luckey: [name] Other Clinician: Referring Kendale Rembold: Treating Darryl Blumenstein/Extender: [name] in Treatment: 63 Wound Status Wound Number: 10 Primary Etiology: Lymphedema Wound Location: Right, Medial Lower Leg Secondary Etiology: Venous Leg Ulcer Wounding Event: Gradually Appeared Wound Status: Open Date Acquired: 06/02/2020 Weeks Of Treatment: 3 Clustered Wound: Yes Wound Measurements Length: (cm) Width: (cm) Depth: (cm) Area: (cm) Volume: (cm) 0 % Reduction in Area: 100% 0 % Reduction in Volume: 100% 0 0 0 Wound Description Classification: Full Thickness Without Exposed Support Structur es Electronic Signature(s) Signed: 07/16/2020 3:14:58 PM By: Rhae Hammock RN Entered By: Rhae Hammock on 06/30/2020 11:25:25 -------------------------------------------------------------------------------- Vitals Details Patient  Name: Date of Service: [name], CHA [name] 06/30/2020 11:00 A M Medical Record Number: [id number] Patient Account Number: [id number] Date of Birth/Sex: Treating RN: 09/28/1930 (87 y.o. [name], [name] Primary Care Randee Huston: [name] Other Clinician: Referring Maily Debarge: Treating Sanaa Zilberman/Extender: [name] in Treatment: 35 Vital Signs Time Taken: 11:12 Temperature (F): 97.8 Height (in): 60 Pulse (bpm): 85 Weight (lbs): 130 Respiratory Rate (breaths/min): 17 Body Mass Index (BMI): 25.4 Blood Pressure (mmHg): 191/67 Reference Range: 80 - 120 mg / dl Electronic Signature(s) Signed: 07/16/2020 3:14:58 PM By: Rhae Hammock RN Entered By: Rhae Hammock on 06/30/2020 11:12:32

## 2020-07-19 LAB — CULTURE, BLOOD (ROUTINE X 2)
Culture: NO GROWTH
Culture: NO GROWTH
Special Requests: ADEQUATE
Special Requests: ADEQUATE

## 2020-07-20 DIAGNOSIS — H9190 Unspecified hearing loss, unspecified ear: Secondary | ICD-10-CM | POA: Insufficient documentation

## 2020-07-20 DIAGNOSIS — I872 Venous insufficiency (chronic) (peripheral): Secondary | ICD-10-CM | POA: Insufficient documentation

## 2020-07-21 ENCOUNTER — Ambulatory Visit (INDEPENDENT_AMBULATORY_CARE_PROVIDER_SITE_OTHER): Payer: Medicare HMO | Admitting: Internal Medicine

## 2020-07-21 ENCOUNTER — Other Ambulatory Visit: Payer: Self-pay

## 2020-07-21 ENCOUNTER — Encounter: Payer: Self-pay | Admitting: Internal Medicine

## 2020-07-21 DIAGNOSIS — H9190 Unspecified hearing loss, unspecified ear: Secondary | ICD-10-CM | POA: Diagnosis not present

## 2020-07-21 DIAGNOSIS — I872 Venous insufficiency (chronic) (peripheral): Secondary | ICD-10-CM | POA: Diagnosis not present

## 2020-07-21 NOTE — Progress Notes (Signed)
Erin [name] for Infectious Disease  Reason for Consult: Right leg cellulitis Referring Provider: Dr. Roxan Hockey  Assessment: I suspect that the erythema of her legs is multifactorial.  Acute on chronic venous insufficiency can sometimes mimic cellulitis.  Given that she has some erythema of both legs I think that this is probably more due to venous insufficiency than due to infection at this time.  The dose of dalbavancin as she received in the ED should be sufficient to treat for the possibility of superimposed cellulitis.  She does not need any other antibiotic therapy at this time. I had a lengthy discussion with them about the chronic, incurable nature of venous insufficiency.  Plan:  1. No further antibiotic therapy indicated 2. Continue chronic management of venous insufficiency  Patient Active Problem List   Diagnosis Date Noted  . Cellulitis of right leg 07/15/2020    Priority: High  . Chronic venous insufficiency of lower extremity 07/20/2020  . Hearing loss 07/20/2020  . HTN (hypertension) 07/15/2020  . Breast cancer of upper-inner quadrant of left female breast (Erin [name]) 10/28/2015    Patient's Medications  New Prescriptions   No medications on file  Previous Medications   ASCORBIC ACID (VITAMIN C) 100 MG TABLET    Take 100 mg by mouth daily.   ASPIRIN 81 MG TABLET    Take 81 mg by mouth daily.   BENAZEPRIL (LOTENSIN) 20 MG TABLET    Take 20 mg by mouth daily.   FUROSEMIDE (LASIX) 20 MG TABLET    Take 20 mg by mouth daily.   IBUPROFEN (ADVIL,MOTRIN) 200 MG TABLET    Take 200 mg by mouth every 6 (six) hours as needed.   LUMIGAN 0.01 % SOLN    Place 1 drop into both eyes at bedtime.   OMEGA-3 FATTY ACIDS (FISH OIL PO)    Take 600 mg by mouth.   RED YEAST RICE EXTRACT (RED YEAST RICE PO)    Take 2 tablets by mouth.   VITAMIN E 100 UNIT CAPSULE    Take by mouth daily.  Modified Medications   No medications on file  Discontinued Medications   ANASTROZOLE  (ARIMIDEX) 1 MG TABLET    Take 1 tablet (1 mg total) by mouth daily.   ANASTROZOLE (ARIMIDEX) 1 MG TABLET    TAKE ONE TABLET BY MOUTH EVERY DAY    HPI: Erin [name] is a 87 y.o. female with chronic venous stasis of her lower extremities.  She has been followed by Erin [name] at the wound center.  When he evaluated her on 07/14/2020 she had acute cellulitis from her foot to her right knee.  She was referred to the emergency department.  She was not febrile but was slightly hypotensive.  She received IV fluids and was evaluated for admission by Dr. Denton [name].  He felt she was clinically stable and a good candidate for IV dalbavancin and outpatient follow-up.  She received a dose of IV dalbavancin in the ED.   She is accompanied today by a family friend who helps her with care of her legs and feet.  Erin [name] says that she was not feeling bad when she was taken to the emergency department.  Her friend says that her legs are slightly more erythematous than normal but that they are always somewhat reddish-pink.  Her major complaint is itching around her feet.  She lives alone so she is up on her feet throughout the day.  She  has been using a Velcro compression boot recently but also has compressive stockings that she uses when she is feeling better.  She says that the stockings are hard to put on her right leg.  She knows that the swelling is due to "a vein problem".  Review of Systems: Review of Systems  Constitutional: Negative for chills, diaphoresis and fever.  HENT: Positive for hearing loss.   Skin: Positive for itching. Negative for rash.      Past Medical History:  Diagnosis Date  . Cancer [facility]) 2017   left breast cancer  . Cystitis   . HOH (hard of hearing)   . Hypertension     Social History   Tobacco Use  . Smoking status: Never Smoker  . Smokeless tobacco: Never Used  Vaping Use  . Vaping Use: Never used  Substance Use Topics  . Alcohol use: No  . Drug use: No    No  family history on file. No Known Allergies  OBJECTIVE: There were no vitals filed for this visit. There is no height or weight on file to calculate BMI.   Physical Exam Constitutional:      Comments: She is very pleasant.  She is hard of hearing and reads lips.  She is seated in a wheelchair.  Cardiovascular:     Rate and Rhythm: Normal rate and regular rhythm.     Heart sounds: No murmur heard.   Pulmonary:     Effort: Pulmonary effort is normal.     Breath sounds: Normal breath sounds.  Skin:    Findings: Erythema present.     Comments: She has pitting lymphedema of both lower legs, right greater than left.  She has no open sores.  There is diffuse erythema of both legs, right greater than left.  Her right leg is not particularly warm to touch and it is not painful.  She has some cracked weeping skin on her right foot.  There is no purulence or malodor.  Psychiatric:        Mood and Affect: Mood normal.     Microbiology: Recent Results (from the past 240 hour(s))  Blood culture (routine x 2)     Status: None   Collection Time: 07/14/20 11:13 PM   Specimen: BLOOD  Result Value Ref Range Status   Specimen Description BLOOD RIGHT ANTECUBITAL  Final   Special Requests   Final    BOTTLES DRAWN AEROBIC AND ANAEROBIC Blood Culture adequate volume   Culture   Final    NO GROWTH 5 DAYS Performed at [facility], [address] [place], [place] [address]    Report Status 07/19/2020 FINAL  Final  Blood culture (routine x 2)     Status: None   Collection Time: 07/14/20 11:26 PM   Specimen: BLOOD RIGHT FOREARM  Result Value Ref Range Status   Specimen Description BLOOD RIGHT FOREARM  Final   Special Requests   Final    BOTTLES DRAWN AEROBIC AND ANAEROBIC Blood Culture adequate volume   Culture   Final    NO GROWTH 5 DAYS Performed at [facility], [address], [place], [place] [address]    Report Status 07/19/2020 FINAL  Final  Resp Panel by RT-PCR (Flu A&B, Covid)  Nasopharyngeal Swab     Status: None   Collection Time: 07/15/20  2:39 AM   Specimen: Nasopharyngeal Swab; Nasopharyngeal(NP) swabs in vial transport medium  Result Value Ref Range Status   SARS Coronavirus 2 by RT PCR NEGATIVE  NEGATIVE Final    Comment: (NOTE) SARS-CoV-2 target nucleic acids are NOT DETECTED.  The SARS-CoV-2 RNA is generally detectable in upper respiratory specimens during the acute phase of infection. The lowest concentration of SARS-CoV-2 viral copies this assay can detect is 138 copies/mL. A negative result does not preclude SARS-Cov-2 infection and should not be used as the sole basis for treatment or other patient management decisions. A negative result may occur with  improper specimen collection/handling, submission of specimen other than nasopharyngeal swab, presence of viral mutation(s) within the areas targeted by this assay, and inadequate number of viral copies(<138 copies/mL). A negative result must be combined with clinical observations, patient history, and epidemiological information. The expected result is Negative.  Fact Sheet for Patients:  EntrepreneurPulse.com.au  Fact Sheet for Healthcare Providers:  IncredibleEmployment.be  This test is no t yet approved or cleared by the [place] FDA and  has been authorized for detection and/or diagnosis of SARS-CoV-2 by FDA under an Emergency Use Authorization (EUA). This EUA will remain  in effect (meaning this test can be used) for the duration of the COVID-19 declaration under Section 564(b)(1) of the Act, 21 U.S.C.section 360bbb-3(b)(1), unless the authorization is terminated  or revoked sooner.       Influenza A by PCR NEGATIVE NEGATIVE Final   Influenza B by PCR NEGATIVE NEGATIVE Final    Comment: (NOTE) The Xpert Xpress SARS-CoV-2/FLU/RSV plus assay is intended as an aid in the diagnosis of influenza from Nasopharyngeal swab specimens and should not be  used as a sole basis for treatment. Nasal washings and aspirates are unacceptable for Xpert Xpress SARS-CoV-2/FLU/RSV testing.  Fact Sheet for Patients: EntrepreneurPulse.com.au  Fact Sheet for Healthcare Providers: IncredibleEmployment.be  This test is not yet approved or cleared by the [place] FDA and has been authorized for detection and/or diagnosis of SARS-CoV-2 by FDA under an Emergency Use Authorization (EUA). This EUA will remain in effect (meaning this test can be used) for the duration of the COVID-19 declaration under Section 564(b)(1) of the Act, 21 U.S.C. section 360bbb-3(b)(1), unless the authorization is terminated or revoked.  Performed at [facility], [address] [place], [place] [address]   MRSA PCR Screening     Status: None   Collection Time: 07/15/20 11:00 AM   Specimen: Nasal Mucosa; Nasopharyngeal  Result Value Ref Range Status   MRSA by PCR NEGATIVE NEGATIVE Final    Comment:        The GeneXpert MRSA Assay (FDA approved for NASAL specimens only), is one component of a comprehensive MRSA colonization surveillance program. It is not intended to diagnose MRSA infection nor to guide or monitor treatment for MRSA infections. Performed at [facility], [address], [place],  [address]     Michel Bickers, [place] [facility] [phone] pager   [phone] cell 07/21/2020, 9:52 AM

## 2020-07-25 ENCOUNTER — Encounter (HOSPITAL_COMMUNITY): Payer: Self-pay

## 2020-07-25 ENCOUNTER — Emergency Department (HOSPITAL_COMMUNITY): Payer: Medicare HMO

## 2020-07-25 ENCOUNTER — Observation Stay (HOSPITAL_COMMUNITY)
Admission: EM | Admit: 2020-07-25 | Discharge: 2020-08-03 | Disposition: A | Discharge status: Home / Self Care | Payer: Medicare HMO | Attending: Internal Medicine | Admitting: Internal Medicine

## 2020-07-25 ENCOUNTER — Other Ambulatory Visit: Payer: Self-pay

## 2020-07-25 DIAGNOSIS — Z79899 Other long term (current) drug therapy: Secondary | ICD-10-CM | POA: Insufficient documentation

## 2020-07-25 DIAGNOSIS — M79671 Pain in right foot: Secondary | ICD-10-CM

## 2020-07-25 DIAGNOSIS — K449 Diaphragmatic hernia without obstruction or gangrene: Secondary | ICD-10-CM | POA: Insufficient documentation

## 2020-07-25 DIAGNOSIS — D631 Anemia in chronic kidney disease: Secondary | ICD-10-CM | POA: Insufficient documentation

## 2020-07-25 DIAGNOSIS — I10 Essential (primary) hypertension: Secondary | ICD-10-CM | POA: Diagnosis present

## 2020-07-25 DIAGNOSIS — I129 Hypertensive chronic kidney disease with stage 1 through stage 4 chronic kidney disease, or unspecified chronic kidney disease: Secondary | ICD-10-CM | POA: Insufficient documentation

## 2020-07-25 DIAGNOSIS — Z7982 Long term (current) use of aspirin: Secondary | ICD-10-CM | POA: Diagnosis not present

## 2020-07-25 DIAGNOSIS — R799 Abnormal finding of blood chemistry, unspecified: Secondary | ICD-10-CM | POA: Diagnosis present

## 2020-07-25 DIAGNOSIS — I872 Venous insufficiency (chronic) (peripheral): Secondary | ICD-10-CM | POA: Diagnosis not present

## 2020-07-25 DIAGNOSIS — L03115 Cellulitis of right lower limb: Secondary | ICD-10-CM | POA: Diagnosis not present

## 2020-07-25 DIAGNOSIS — Z853 Personal history of malignant neoplasm of breast: Secondary | ICD-10-CM | POA: Insufficient documentation

## 2020-07-25 DIAGNOSIS — Z723 Lack of physical exercise: Secondary | ICD-10-CM | POA: Diagnosis not present

## 2020-07-25 DIAGNOSIS — D649 Anemia, unspecified: Secondary | ICD-10-CM | POA: Diagnosis present

## 2020-07-25 DIAGNOSIS — N1831 Chronic kidney disease, stage 3a: Secondary | ICD-10-CM | POA: Diagnosis not present

## 2020-07-25 DIAGNOSIS — Z20822 Contact with and (suspected) exposure to covid-19: Secondary | ICD-10-CM | POA: Diagnosis not present

## 2020-07-25 DIAGNOSIS — R2241 Localized swelling, mass and lump, right lower limb: Secondary | ICD-10-CM | POA: Diagnosis present

## 2020-07-25 LAB — BASIC METABOLIC PANEL
Anion gap: 8 (ref 5–15)
BUN: 36 mg/dL — ABNORMAL HIGH (ref 8–23)
CO2: 25 mmol/L (ref 22–32)
Calcium: 9.5 mg/dL (ref 8.9–10.3)
Chloride: 105 mmol/L (ref 98–111)
Creatinine, Ser: 1.12 mg/dL — ABNORMAL HIGH (ref 0.44–1.00)
GFR, Estimated: 48 mL/min — ABNORMAL LOW (ref >60)
Glucose, Bld: 169 mg/dL — ABNORMAL HIGH (ref 70–99)
Potassium: 4.8 mmol/L (ref 3.5–5.1)
Sodium: 138 mmol/L (ref 135–145)

## 2020-07-25 LAB — URINALYSIS, ROUTINE W REFLEX MICROSCOPIC
Bilirubin Urine: NEGATIVE
Glucose, UA: NEGATIVE mg/dL
Hgb urine dipstick: NEGATIVE
Ketones, ur: NEGATIVE mg/dL
Leukocytes,Ua: NEGATIVE
Nitrite: NEGATIVE
Protein, ur: NEGATIVE mg/dL
Specific Gravity, Urine: 1.016 (ref 1.005–1.030)
pH: 7 (ref 5.0–8.0)

## 2020-07-25 LAB — CBC WITH DIFFERENTIAL/PLATELET
Abs Immature Granulocytes: 0.04 10*3/uL (ref 0.00–0.07)
Basophils Absolute: 0 10*3/uL (ref 0.0–0.1)
Basophils Relative: 0 %
Eosinophils Absolute: 0 10*3/uL (ref 0.0–0.5)
Eosinophils Relative: 0 %
HCT: 31 % — ABNORMAL LOW (ref 36.0–46.0)
Hemoglobin: 9.6 g/dL — ABNORMAL LOW (ref 12.0–15.0)
Immature Granulocytes: 1 %
Lymphocytes Relative: 7 %
Lymphs Abs: 0.5 10*3/uL — ABNORMAL LOW (ref 0.7–4.0)
MCH: 27.4 pg (ref 26.0–34.0)
MCHC: 31 g/dL (ref 30.0–36.0)
MCV: 88.3 fL (ref 80.0–100.0)
Monocytes Absolute: 0.5 10*3/uL (ref 0.1–1.0)
Monocytes Relative: 6 %
Neutro Abs: 6.5 10*3/uL (ref 1.7–7.7)
Neutrophils Relative %: 86 %
Platelets: 284 10*3/uL (ref 150–400)
RBC: 3.51 MIL/uL — ABNORMAL LOW (ref 3.87–5.11)
RDW: 14.8 % (ref 11.5–15.5)
WBC: 7.6 10*3/uL (ref 4.0–10.5)
nRBC: 0 % (ref 0.0–0.2)

## 2020-07-25 LAB — RESP PANEL BY RT-PCR (FLU A&B, COVID) ARPGX2
Influenza A by PCR: NEGATIVE
Influenza B by PCR: NEGATIVE
SARS Coronavirus 2 by RT PCR: NEGATIVE

## 2020-07-25 LAB — LACTIC ACID, PLASMA: Lactic Acid, Venous: 1.1 mmol/L (ref 0.5–1.9)

## 2020-07-25 MED ORDER — LATANOPROST 0.005 % OP SOLN
1.0000 [drp] | Freq: Every day | OPHTHALMIC | Status: DC
  Administered 2020-07-26 – 2020-08-02 (×9): 1 [drp] via OPHTHALMIC
  Filled 2020-07-25: qty 2.5

## 2020-07-25 MED ORDER — SODIUM CHLORIDE 0.9 % IV SOLN
1.0000 g | Freq: Three times a day (TID) | INTRAVENOUS | Status: DC
  Filled 2020-07-25 (×2): qty 10

## 2020-07-25 MED ORDER — CEFAZOLIN SODIUM-DEXTROSE 1-4 GM/50ML-% IV SOLN
1.0000 g | Freq: Two times a day (BID) | INTRAVENOUS | Status: DC
  Filled 2020-07-25 (×2): qty 50

## 2020-07-25 MED ORDER — SODIUM CHLORIDE 0.9 % IV SOLN
1.0000 g | Freq: Two times a day (BID) | INTRAVENOUS | Status: DC
  Filled 2020-07-25 (×2): qty 10

## 2020-07-25 MED ORDER — FUROSEMIDE 20 MG PO TABS
20.0000 mg | ORAL_TABLET | Freq: Every day | ORAL | Status: DC
  Administered 2020-07-26 – 2020-08-03 (×9): 20 mg via ORAL
  Filled 2020-07-25 (×9): qty 1

## 2020-07-25 MED ORDER — ACETAMINOPHEN 325 MG PO TABS
650.0000 mg | ORAL_TABLET | Freq: Four times a day (QID) | ORAL | Status: DC | PRN
Start: 2020-07-25 — End: 2020-08-03
  Administered 2020-07-26 – 2020-07-28 (×3): 650 mg via ORAL
  Filled 2020-07-25 (×3): qty 2

## 2020-07-25 MED ORDER — ONDANSETRON HCL 4 MG PO TABS: 4.0000 mg | ORAL_TABLET | Freq: Four times a day (QID) | ORAL | Status: DC | PRN

## 2020-07-25 MED ORDER — DEXTROSE 5 % IV SOLN
1500.0000 mg | Freq: Once | INTRAVENOUS | Status: DC
  Filled 2020-07-25: qty 75

## 2020-07-25 MED ORDER — VITAMIN C 250 MG PO TABS
125.0000 mg | ORAL_TABLET | Freq: Every day | ORAL | Status: DC
  Filled 2020-07-25 (×3): qty 1

## 2020-07-25 MED ORDER — CEFAZOLIN SODIUM-DEXTROSE 1-4 GM/50ML-% IV SOLN
INTRAVENOUS | Status: AC
  Filled 2020-07-25: qty 50

## 2020-07-25 MED ORDER — ACETAMINOPHEN 325 MG PO TABS
650.0000 mg | ORAL_TABLET | Freq: Once | ORAL | Status: AC
  Administered 2020-07-25: 650 mg via ORAL
  Filled 2020-07-25: qty 2

## 2020-07-25 MED ORDER — ONDANSETRON HCL 4 MG/2ML IJ SOLN
4.0000 mg | Freq: Once | INTRAMUSCULAR | Status: AC
  Administered 2020-07-25: 4 mg via INTRAVENOUS
  Filled 2020-07-25: qty 2

## 2020-07-25 MED ORDER — BENAZEPRIL HCL 10 MG PO TABS
20.0000 mg | ORAL_TABLET | Freq: Every day | ORAL | Status: DC
  Administered 2020-07-26 – 2020-07-28 (×3): 20 mg via ORAL
  Filled 2020-07-25 (×3): qty 2

## 2020-07-25 MED ORDER — ASPIRIN 81 MG PO CHEW
81.0000 mg | CHEWABLE_TABLET | Freq: Every day | ORAL | Status: DC
  Administered 2020-07-26 – 2020-08-03 (×9): 81 mg via ORAL
  Filled 2020-07-25 (×9): qty 1

## 2020-07-25 MED ORDER — ONDANSETRON HCL 4 MG/2ML IJ SOLN: 4.0000 mg | Freq: Three times a day (TID) | INTRAMUSCULAR | Status: DC | PRN

## 2020-07-25 MED ORDER — ACETAMINOPHEN 650 MG RE SUPP: 650.0000 mg | Freq: Four times a day (QID) | RECTAL | Status: DC | PRN

## 2020-07-25 MED ORDER — HYDROMORPHONE HCL 1 MG/ML IJ SOLN
0.5000 mg | Freq: Once | INTRAMUSCULAR | Status: AC
  Administered 2020-07-26: 0.5 mg via INTRAVENOUS
  Filled 2020-07-25: qty 0.5

## 2020-07-25 MED ORDER — ENOXAPARIN SODIUM 30 MG/0.3ML IJ SOSY
30.0000 mg | PREFILLED_SYRINGE | INTRAMUSCULAR | Status: DC
  Administered 2020-07-26 – 2020-07-29 (×4): 30 mg via SUBCUTANEOUS
  Filled 2020-07-25 (×4): qty 0.3

## 2020-07-25 MED ORDER — SODIUM CHLORIDE 0.9 % IV SOLN
1.0000 g | Freq: Once | INTRAVENOUS | Status: DC
  Filled 2020-07-25: qty 10

## 2020-07-25 NOTE — ED Provider Notes (Signed)
[facility] Provider Note   CSN: [id number] Arrival date & time: 07/25/20  1730     History Chief Complaint  Patient presents with  . Wound Check    Erin [name] is a 87 y.o. female.  Patient with chronic lower extremity edema felt to be secondary to venous stasis.  Patient with chronic wounds related to this and skin breakdown.  Followed by wound management.  Patient seen here and admitted May 3 but stayed in the ED because beds were not available.  Then patient was ready for discharge home.  Patient was started on dalbavancin received 1 IV dose given by hospitalist.  And then had follow-up with infectious disease.  And was seen by them on May 11.  They felt that changes at that time were all secondary to venous stasis.  And there was no significant cellulitis and did not recommend continuation of antibiotics.  Patient lives by herself.  Does not really have home nurse.  So she struggles with her compression stockings and compression wraps to her legs.  This was documented by infectious disease as well.  Brought in by her brother stating that suddenly today could not put weight on the leg.  Due to discomfort.  Normally able to walk.  They feel that the leg is much worse than it was.  They are concerned about infection.  Also patient struggling to manage herself at home.        Past Medical History:  Diagnosis Date  . Cancer [facility]) 2017   left breast cancer  . Cystitis   . HOH (hard of hearing)   . Hypertension     Patient Active Problem List   Diagnosis Date Noted  . Cellulitis of foot, right 07/25/2020  . Normocytic anemia   . Elevated BUN/creatinine.   . Chronic venous insufficiency of lower extremity 07/20/2020  . Hearing loss 07/20/2020  . Cellulitis of right leg 07/15/2020  . HTN (hypertension) 07/15/2020  . Breast cancer of upper-inner quadrant of left female breast ([place]) 10/28/2015    Past Surgical History:  Procedure Laterality Date  .  ABDOMINAL HYSTERECTOMY     1974 - Dr. Gertie Fey  . BACK SURGERY    . BREAST LUMPECTOMY     left july 2017  . BREAST LUMPECTOMY WITH RADIOACTIVE SEED LOCALIZATION Left 10/07/2015   Procedure: LEFT BREAST LUMPECTOMY WITH RADIOACTIVE SEED LOCALIZATION;  Surgeon: Autumn Messing III, MD;  Location: [place];  Service: General;  Laterality: Left;  LEFT BREAST LUMPECTOMY WITH RADIOACTIVE SEED LOCALIZATION  . BREAST SURGERY    . Lumbar spinal stenosis     1994 - Dr. Joya Salm  . RE-EXCISION OF BREAST CANCER,SUPERIOR MARGINS Left 11/22/2015   Procedure: RE-EXCISION OF LEFT BREAST CANCER, SUPERIOR MARGINS;  Surgeon: Autumn Messing III, MD;  Location: [place];  Service: General;  Laterality: Left;     OB History   No obstetric history on file.     History reviewed. No pertinent family history.  Social History   Tobacco Use  . Smoking status: Never Smoker  . Smokeless tobacco: Never Used  Vaping Use  . Vaping Use: Never used  Substance Use Topics  . Alcohol use: No  . Drug use: No    Home Medications Prior to Admission medications   Medication Sig Start Date End Date Taking? Authorizing Provider  Ascorbic Acid (VITAMIN C) 100 MG tablet Take 100 mg by mouth daily.    [provider]  aspirin  81 MG tablet Take 81 mg by mouth daily.    [provider]  benazepril (LOTENSIN) 20 MG tablet Take 20 mg by mouth daily. 10/25/15   [provider]  furosemide (LASIX) 20 MG tablet Take 20 mg by mouth daily. 05/28/20   [provider]  ibuprofen (ADVIL,MOTRIN) 200 MG tablet Take 200 mg by mouth every 6 (six) hours as needed.    [provider]  LUMIGAN 0.01 % SOLN Place 1 drop into both eyes at bedtime. 10/25/15   [provider]  Omega-3 Fatty Acids (FISH OIL PO) Take 600 mg by mouth.    [provider]  Red Yeast Rice Extract (RED YEAST RICE PO) Take 2 tablets by mouth.    [provider]  vitamin E 100 UNIT  capsule Take by mouth daily.    [provider]    Allergies    Patient has no known allergies.  Review of Systems   Review of Systems  Constitutional: Negative for chills and fever.  HENT: Positive for hearing loss. Negative for congestion, rhinorrhea and sore throat.   Eyes: Negative for visual disturbance.  Respiratory: Negative for cough and shortness of breath.   Cardiovascular: Positive for leg swelling. Negative for chest pain.  Gastrointestinal: Negative for abdominal pain, diarrhea, nausea and vomiting.  Genitourinary: Negative for dysuria.  Musculoskeletal: Negative for back pain and neck pain.  Skin: Negative for rash.  Neurological: Negative for dizziness, light-headedness and headaches.  Hematological: Does not bruise/bleed easily.  Psychiatric/Behavioral: Negative for confusion.    Physical Exam Updated Vital Signs BP (!) 148/64 (BP Location: Left Arm)   Pulse 97   Temp 98.8 F (37.1 C) (Oral)   Resp 18   Ht 1.524 m (5')   Wt 56.7 kg   SpO2 98%   BMI 24.41 kg/m   Physical Exam Vitals and nursing note reviewed.  Constitutional:      General: She is not in acute distress.    Appearance: Normal appearance. She is well-developed.  HENT:     Head: Normocephalic and atraumatic.  Eyes:     Extraocular Movements: Extraocular movements intact.     Conjunctiva/sclera: Conjunctivae normal.     Pupils: Pupils are equal, round, and reactive to light.  Cardiovascular:     Rate and Rhythm: Normal rate and regular rhythm.     Heart sounds: No murmur heard.   Pulmonary:     Effort: Pulmonary effort is normal. No respiratory distress.     Breath sounds: Normal breath sounds.  Abdominal:     Palpations: Abdomen is soft.     Tenderness: There is no abdominal tenderness.  Musculoskeletal:        General: Swelling and tenderness present.     Cervical back: Neck supple.     Right lower leg: Edema present.     Left lower leg: Edema present.     Comments:  Patient's left lower extremity without as much edema minimal redness.  Patient had that leg wrapped.  The right lower extremity from the knee down no red streaking into the thigh area has significant edema and erythema.  The top and sides of the foot has some superficial skin peeling.  No deep ulcers.  The right lower extremity is very warm compared to the left lower extremity.  Skin:    General: Skin is warm and dry.     Capillary Refill: Capillary refill takes less than 2 seconds.  Neurological:     Mental  Status: She is alert.     Comments: Patient very hard of hearing at baseline.  She will move all 4 extremities.     ED Results / Procedures / Treatments   Labs (all labs ordered are listed, but only abnormal results are displayed) Labs Reviewed  CBC WITH DIFFERENTIAL/PLATELET - Abnormal; Notable for the following components:      Result Value   RBC 3.51 (*)    Hemoglobin 9.6 (*)    HCT 31.0 (*)    Lymphs Abs 0.5 (*)    All other components within normal limits  BASIC METABOLIC PANEL - Abnormal; Notable for the following components:   Glucose, Bld 169 (*)    BUN 36 (*)    Creatinine, Ser 1.12 (*)    GFR, Estimated 48 (*)    All other components within normal limits  RESP PANEL BY RT-PCR (FLU A&B, COVID) ARPGX2  CULTURE, BLOOD (ROUTINE X 2)  CULTURE, BLOOD (ROUTINE X 2)  LACTIC ACID, PLASMA  URINALYSIS, ROUTINE W REFLEX MICROSCOPIC  VITAMIN B12  FOLATE  IRON AND TIBC  FERRITIN  RETICULOCYTES  CBC  COMPREHENSIVE METABOLIC PANEL    EKG None  Radiology DG Chest Port 1 View  Result Date: 07/25/2020 CLINICAL DATA:  Cough EXAM: PORTABLE CHEST 1 VIEW COMPARISON:  None. FINDINGS: Mild cardiac enlargement. No central vascular congestion. Moderate hiatal hernia. Both lungs are clear. Surgical clips project over the left breast. IMPRESSION: 1. Mild cardiac enlargement without focal consolidation or overt pulmonary edema. 2. Moderate hiatal hernia. Electronically Signed   By:  Dahlia Bailiff MD   On: 07/25/2020 21:35    Procedures Procedures   CRITICAL CARE Performed by: Fredia Sorrow Total critical care time: 35 minutes Critical care time was exclusive of separately billable procedures and treating other patients. Critical care was necessary to treat or prevent imminent or life-threatening deterioration. Critical care was time spent personally by me on the following activities: development of treatment plan with patient and/or surrogate as well as nursing, discussions with consultants, evaluation of patient's response to treatment, examination of patient, obtaining history from patient or surrogate, ordering and performing treatments and interventions, ordering and review of laboratory studies, ordering and review of radiographic studies, pulse oximetry and re-evaluation of patient's condition.   Medications Ordered in ED Medications  aspirin chewable tablet 81 mg (has no administration in time range)  vitamin C (ASCORBIC ACID) tablet 125 mg (has no administration in time range)  latanoprost (XALATAN) 0.005 % ophthalmic solution 1 drop (1 drop Both Eyes Given 07/26/20 0034)  furosemide (LASIX) tablet 20 mg (has no administration in time range)  benazepril (LOTENSIN) tablet 20 mg (has no administration in time range)  enoxaparin (LOVENOX) injection 30 mg (has no administration in time range)  acetaminophen (TYLENOL) tablet 650 mg (has no administration in time range)    Or  acetaminophen (TYLENOL) suppository 650 mg (has no administration in time range)  ondansetron (ZOFRAN) tablet 4 mg (has no administration in time range)    Or  ondansetron (ZOFRAN) injection 4 mg (has no administration in time range)  ceFAZolin (ANCEF) IVPB 1 g/50 mL premix (1 g Intravenous New Bag/Given 07/26/20 0033)  ceFAZolin (ANCEF) IVPB 1 g/50 mL premix (has no administration in time range)  acetaminophen (TYLENOL) tablet 650 mg (650 mg Oral Given 07/25/20 1815)  ondansetron (ZOFRAN)  injection 4 mg (4 mg Intravenous Given 07/25/20 2358)  HYDROmorphone (DILAUDID) injection 0.5 mg (0.5 mg Intravenous Given 07/26/20 0000)    ED Course  I have reviewed the triage vital signs and the nursing notes.  Pertinent labs & imaging results that were available during my care of the patient were reviewed by me and considered in my medical decision making (see chart for details).    MDM Rules/Calculators/A&P                          Patient with fever here.  100.2.  Patient seen by infectious disease on May 11.  I do think that patient does have a degree of cellulitis in the right lower extremity.  No significant difference between the right leg and the left leg.  She also does have a fever.  Chest x-ray was without any acute findings.  COVID testing was pending.  No leukocytosis.  Urinalysis negative.  COVID testing back is negative.  Mild anemia with hemoglobin of 9.6.  BUN 36 creatinine 1.12.  GFR 48 baseline for patient.  No significant electrolyte abnormalities.  Blood cultures were sent.  Lactic acid is normal.  Patient based on antibiotic protocol for cellulitis started on Ancef.  When patient seen by infectious disease on May 11 did have a negative MRSA screening test.  With hospitalist who will admit for the cellulitis    Final Clinical Impression(s) / ED Diagnoses Final diagnoses:  Cellulitis of right lower extremity    Rx / DC Orders ED Discharge Orders         Ordered    Ambulatory referral to Infectious Disease       Comments: Cellulitis patient:  Received dalbavancin on 07/25/2020.   07/25/20 8032           Fredia Sorrow, MD 07/26/20 [phone]

## 2020-07-25 NOTE — ED Triage Notes (Signed)
Pt brought to ED via [facility] for worsening right foot wound. Pt right leg red and swollen. Pt family stated had been to wound clinic, but has gotten worse in last week.

## 2020-07-25 NOTE — H&P (Incomplete)
History and Physical    Erin [name] DTO:[id number] DOB: March 09, 1931 DOA: 07/25/2020  PCP: Monico Blitz, MD  Patient coming from: Home.  I have personally briefly reviewed patient's old medical records in [place]  Chief Complaint: Right foot wound infection.  HPI: Erin [name] is a 87 y.o. female with medical history significant of hypertension, left breast cancer, history of cystitis, impaired hearing who is brought to the emergency department from home by her brother due to   ED Course: Initial vital signs were temperature 100.2 F, pulse 101, respirations 32, BP 173/96 mmHg and O2 sat 93% on room air.  The patient received Ceftin Myophen 650 mg p.o. x1, hydromorphone 0.5 mg IVP, ondansetron 4 mg IVP and cefazolin 1 g IVPB.  Lab work: Her urinalysis was normal.  CBC showed a white count of 7.6 with 86% neutrophils, hemoglobin 9.6 g/dL platelets 284.  Lactic acid was normal.  BMP normal electrolytes.  Glucose 169, BUN 36 and creatinine 1.12 mg/dL.  Imaging: Portable 1 view chest radiograph showed mild cardiac enlargement without focal consolidation or overt pulmonary edema.  There was a moderate hiatal hernia.  Please see image and full daily report for further detail.  Review of Systems: As per HPI otherwise all other systems reviewed and are negative.  Past Medical History:  Diagnosis Date  . Cancer [facility]) 2017   left breast cancer  . Cystitis   . HOH (hard of hearing)   . Hypertension     Past Surgical History:  Procedure Laterality Date  . ABDOMINAL HYSTERECTOMY     1974 - Dr. Gertie Fey  . BACK SURGERY    . BREAST LUMPECTOMY     left july 2017  . BREAST LUMPECTOMY WITH RADIOACTIVE SEED LOCALIZATION Left 10/07/2015   Procedure: LEFT BREAST LUMPECTOMY WITH RADIOACTIVE SEED LOCALIZATION;  Surgeon: Autumn Messing III, MD;  Location: [place];  Service: General;  Laterality: Left;  LEFT BREAST LUMPECTOMY WITH RADIOACTIVE SEED LOCALIZATION  . BREAST  SURGERY    . Lumbar spinal stenosis     1994 - Dr. Joya Salm  . RE-EXCISION OF BREAST CANCER,SUPERIOR MARGINS Left 11/22/2015   Procedure: RE-EXCISION OF LEFT BREAST CANCER, SUPERIOR MARGINS;  Surgeon: Autumn Messing III, MD;  Location: [place];  Service: General;  Laterality: Left;    Social History  reports that she has never smoked. She has never used smokeless tobacco. She reports that she does not drink alcohol and does not use drugs.  No Known Allergies  History reviewed. No pertinent family history. *** Unacceptable: Noncontributory, unremarkable, or negative. Acceptable: (example)Family history negative for heart disease  Prior to Admission medications   Medication Sig Start Date End Date Taking? Authorizing Provider  Ascorbic Acid (VITAMIN C) 100 MG tablet Take 100 mg by mouth daily.    [provider]  aspirin 81 MG tablet Take 81 mg by mouth daily.    [provider]  benazepril (LOTENSIN) 20 MG tablet Take 20 mg by mouth daily. 10/25/15   [provider]  furosemide (LASIX) 20 MG tablet Take 20 mg by mouth daily. 05/28/20   [provider]  ibuprofen (ADVIL,MOTRIN) 200 MG tablet Take 200 mg by mouth every 6 (six) hours as needed.    [provider]  LUMIGAN 0.01 % SOLN Place 1 drop into both eyes at bedtime. 10/25/15   [provider]  Omega-3 Fatty Acids (FISH OIL PO) Take 600 mg by mouth.    [provider]  Red Yeast Rice Extract (RED YEAST RICE PO) Take 2 tablets by mouth.    [provider]  vitamin E 100 UNIT capsule Take by mouth daily.    [provider]    Physical Exam: Vitals:   07/25/20 2000 07/25/20 2030 07/25/20 2100 07/25/20 2200  BP:  (!) 163/50 (!) 177/59 (!) 165/56  Pulse:  92 90 88  Resp:  14 16 (!) 24  Temp:      TempSrc:      SpO2: 95% 95% 95% 95%  Weight:      Height:       Constitutional: NAD, calm, comfortable Eyes: PERRL, lids and conjunctivae  normal ENMT: Mucous membranes are moist. Posterior pharynx clear of any exudate or lesions. Neck: normal, supple, no masses, no thyromegaly Respiratory: clear to auscultation bilaterally, no wheezing, no crackles. Normal respiratory effort. No accessory muscle use.  Cardiovascular: Regular rate and rhythm, no murmurs / rubs / gallops. No extremity edema. 2+ pedal pulses. No carotid bruits.  Abdomen: no tenderness, no masses palpated. No hepatosplenomegaly. Bowel sounds positive.  Musculoskeletal: no clubbing / cyanosis. No joint deformity upper and lower extremities. Good ROM, no contractures. Normal muscle tone.  Skin: no rashes, lesions, ulcers. No induration Neurologic: CN 2-12 grossly intact. Sensation intact, DTR normal. Strength 5/5 in all 4.  Psychiatric: Normal judgment and insight. Alert and oriented x 3. Normal mood.   Labs on Admission: I have personally reviewed following labs and imaging studies  CBC: Recent Labs  Lab 07/25/20 1800  WBC 7.6  NEUTROABS 6.5  HGB 9.6*  HCT 31.0*  MCV 88.3  PLT 035    Basic Metabolic Panel: Recent Labs  Lab 07/25/20 1800  NA 138  K 4.8  CL 105  CO2 25  GLUCOSE 169*  BUN 36*  CREATININE 1.12*  CALCIUM 9.5    GFR: Estimated Creatinine Clearance: 27.9 mL/min (A) (by C-G formula based on SCr of 1.12 mg/dL (H)).  Liver Function Tests: No results for input(s): AST, ALT, ALKPHOS, BILITOT, PROT, ALBUMIN in the last 168 hours.  Urine analysis:    Component Value Date/Time   COLORURINE YELLOW 07/25/2020 [place] 07/25/2020 2203   LABSPEC 1.016 07/25/2020 2203   PHURINE 7.0 07/25/2020 2203   GLUCOSEU NEGATIVE 07/25/2020 2203   HGBUR NEGATIVE 07/25/2020 2203   BILIRUBINUR NEGATIVE 07/25/2020 2203   [place] 07/25/2020 2203   PROTEINUR NEGATIVE 07/25/2020 2203   NITRITE NEGATIVE 07/25/2020 2203   [place] 07/25/2020 2203    Radiological Exams on Admission: DG Chest Port 1 View  Result  Date: 07/25/2020 CLINICAL DATA:  Cough EXAM: PORTABLE CHEST 1 VIEW COMPARISON:  None. FINDINGS: Mild cardiac enlargement. No central vascular congestion. Moderate hiatal hernia. Both lungs are clear. Surgical clips project over the left breast. IMPRESSION: 1. Mild cardiac enlargement without focal consolidation or overt pulmonary edema. 2. Moderate hiatal hernia. Electronically Signed   By: Dahlia Bailiff MD   On: 07/25/2020 21:35    EKG: Independently reviewed.   Assessment/Plan Principal Problem:   Cellulitis of foot, right   Cellulitis of right leg Observation/telemetry.  Active Problems:   Elevated BUN/creatinine     HTN (hypertension)     Normocytic anemia Check anemia panel. Monitor hematocrit and hemoglobin. Transfuse as needed.   DVT prophylaxis: Lovenox SQ. Code Status:   Full code. Family Communication: Disposition Plan:   Patient is from:  Home.  Anticipated DC to:  TBD.  Anticipated DC date:  07/27/2020 or 07/28/2020.  Anticipated DC barriers: Clinical status/discharge planning..  Consults called: Admission status:  Observation/telemetry.   Severity of Illness: High severity due to presenting with low-grade fever in the setting of right lower extremity cellulitis.  Reubin Milan MD [facility]  How to contact the [facility] Attending or Consulting provider [place] or covering provider during after hours [place], for this patient?   1. Check the care team in [facility] and look for a) attending/consulting [facility] provider listed and b) the [facility] team listed 2. Log into [email or web address] and use [place]'s universal password to access. If you do not have the password, please contact the hospital operator. 3. Locate the [facility] provider you are looking for under [facility] and page to a number that you can be directly reached. 4. If you still have difficulty reaching the provider, please page the [facility] (Director on Call) for the Hospitalists listed on amion for  assistance.  07/25/2020, 11:36 PM   This document was prepared using Dragon voice recognition software and may contain some unintended transcription errors.

## 2020-07-25 NOTE — H&P (Signed)
History and Physical    Erin [name]:[id number] DOB: 12-19-1930 DOA: 07/25/2020  PCP: Monico Blitz, MD  Patient coming from: Home.  I have personally briefly reviewed patient's old medical records in [place]  Chief Complaint: Right foot wound infection.  HPI: Erin [name] is a 87 y.o. female with medical history significant of hypertension, left breast cancer, history of cystitis, severe impaired hearing, lower extremity venous dialysis with chronic wounds who is brought to the emergency department from home by her brother due to worsening right lower extremity wound.  She was seen in the ED and received dalbavancin on 07/15/2020 and was subsequently discharged home.  She follow-up with Dr. Megan Salon from [place] on 07/21/2020 who did not recommend further antibiotics.  However, the patient lives by herself and has been struggling to get her compression wraps and stockings at home.  She does not have home health.  Over the past 2 days, she has had progressively worse pain, particularly on the foot, which has severely impaired her mobility as she is unable to bear weight on her RLE.  Her brother was concerned, we went through her home and brought her to the emergency department.  She was found to have a low-grade fever on arrival.  She otherwise denies headache, rhinorrhea, sore throat, wheezing or hemoptysis.  No chest pain, palpitations, diaphoresis, PND orthopnea.  She gets frequent lower extremity edema.  Denies abdominal pain, appetite changes, nausea, emesis, diarrhea, constipation, melena or hematochezia.  No dysuria or hematuria.  No polyuria, polydipsia, polyphagia or blurred vision.  ED Course: Initial vital signs were temperature 100.2 F, pulse 101, respirations 32, BP 173/96 mmHg and O2 sat 93% on room air.  The patient received Ceftin Myophen 650 mg p.o. x1, hydromorphone 0.5 mg IVP, ondansetron 4 mg IVP and cefazolin 1 g IVPB.  Lab work: Her urinalysis was normal.   CBC showed a white count of 7.6 with 86% neutrophils, hemoglobin 9.6 g/dL platelets 284.  Lactic acid was normal.  BMP normal electrolytes.  Glucose 169, BUN 36 and creatinine 1.12 mg/dL.  Imaging: Portable 1 view chest radiograph showed mild cardiac enlargement without focal consolidation or overt pulmonary edema.  There was a moderate hiatal hernia.  Please see image and full daily report for further detail.  Review of Systems: As per HPI otherwise all other systems reviewed and are negative.  Past Medical History:  Diagnosis Date  . Cancer [facility]) 2017   left breast cancer  . Cystitis   . HOH (hard of hearing)   . Hypertension     Past Surgical History:  Procedure Laterality Date  . ABDOMINAL HYSTERECTOMY     1974 - Dr. Gertie Fey  . BACK SURGERY    . BREAST LUMPECTOMY     left july 2017  . BREAST LUMPECTOMY WITH RADIOACTIVE SEED LOCALIZATION Left 10/07/2015   Procedure: LEFT BREAST LUMPECTOMY WITH RADIOACTIVE SEED LOCALIZATION;  Surgeon: Autumn Messing III, MD;  Location: [place];  Service: General;  Laterality: Left;  LEFT BREAST LUMPECTOMY WITH RADIOACTIVE SEED LOCALIZATION  . BREAST SURGERY    . Lumbar spinal stenosis     1994 - Dr. Joya Salm  . RE-EXCISION OF BREAST CANCER,SUPERIOR MARGINS Left 11/22/2015   Procedure: RE-EXCISION OF LEFT BREAST CANCER, SUPERIOR MARGINS;  Surgeon: Autumn Messing III, MD;  Location: [place];  Service: General;  Laterality: Left;    Social History  reports that she has never smoked. She has never used smokeless tobacco. She  reports that she does not drink alcohol and does not use drugs.  No Known Allergies  Family medical history. Several family members with history of hypertension.  Prior to Admission medications   Medication Sig Start Date End Date Taking? Authorizing Provider  Ascorbic Acid (VITAMIN C) 100 MG tablet Take 100 mg by mouth daily.    [provider]  aspirin 81 MG tablet Take 81 mg by mouth daily.     [provider]  benazepril (LOTENSIN) 20 MG tablet Take 20 mg by mouth daily. 10/25/15   [provider]  furosemide (LASIX) 20 MG tablet Take 20 mg by mouth daily. 05/28/20   [provider]  ibuprofen (ADVIL,MOTRIN) 200 MG tablet Take 200 mg by mouth every 6 (six) hours as needed.    [provider]  LUMIGAN 0.01 % SOLN Place 1 drop into both eyes at bedtime. 10/25/15   [provider]  Omega-3 Fatty Acids (FISH OIL PO) Take 600 mg by mouth.    [provider]  Red Yeast Rice Extract (RED YEAST RICE PO) Take 2 tablets by mouth.    [provider]  vitamin E 100 UNIT capsule Take by mouth daily.    [provider]    Physical Exam: Vitals:   07/25/20 2000 07/25/20 2030 07/25/20 2100 07/25/20 2200  BP:  (!) 163/50 (!) 177/59 (!) 165/56  Pulse:  92 90 88  Resp:  14 16 (!) 24  Temp:      TempSrc:      SpO2: 95% 95% 95% 95%  Weight:      Height:       Constitutional: Frail elderly female.  NAD, calm, comfortable Eyes: PERRL, lids and conjunctivae normal ENMT: Severe hearing impairment.  Mucous membranes are moist. Posterior pharynx clear of any exudate or lesions. Neck: normal, supple, no masses, no thyromegaly Respiratory: clear to auscultation bilaterally, no wheezing, no crackles. Normal respiratory effort. No accessory muscle use.  Cardiovascular: Regular rate and rhythm, no murmurs / rubs / gallops. No extremity edema. 2+ pedal pulses. No carotid bruits.  Abdomen: No distention.  Bowel sounds positive.  Soft, no tenderness, no masses palpated. No hepatosplenomegaly.  Musculoskeletal: Moderate generalized weakness.  No clubbing / cyanosis.  Good ROM, no contractures. Normal muscle tone.  Skin: Positive erythema, edema, calor, TTP with some areas of pustular bullae.  Please see picture below. Neurologic: CN 2-12 grossly intact. Sensation intact, DTR normal. Strength 5/5 in all 4.  Psychiatric: Normal judgment and  insight. Alert and oriented x 3. Normal mood.       Labs on Admission: I have personally reviewed following labs and imaging studies  CBC: Recent Labs  Lab 07/25/20 1800  WBC 7.6  NEUTROABS 6.5  HGB 9.6*  HCT 31.0*  MCV 88.3  PLT 580    Basic Metabolic Panel: Recent Labs  Lab 07/25/20 1800  NA 138  K 4.8  CL 105  CO2 25  GLUCOSE 169*  BUN 36*  CREATININE 1.12*  CALCIUM 9.5    GFR: Estimated Creatinine Clearance: 27.9 mL/min (A) (by C-G formula based on SCr of 1.12 mg/dL (H)).  Liver Function Tests: No results for input(s): AST, ALT, ALKPHOS, BILITOT, PROT, ALBUMIN in the last 168 hours.  Urine analysis:    Component Value Date/Time   COLORURINE YELLOW 07/25/2020 [place] 07/25/2020 2203   LABSPEC 1.016 07/25/2020 2203   PHURINE 7.0 07/25/2020 2203   GLUCOSEU NEGATIVE 07/25/2020 2203   HGBUR  NEGATIVE 07/25/2020 [place] 07/25/2020 2203   [place] 07/25/2020 2203   PROTEINUR NEGATIVE 07/25/2020 2203   NITRITE NEGATIVE 07/25/2020 2203   LEUKOCYTESUR NEGATIVE 07/25/2020 2203    Radiological Exams on Admission: DG Chest Port 1 View  Result Date: 07/25/2020 CLINICAL DATA:  Cough EXAM: PORTABLE CHEST 1 VIEW COMPARISON:  None. FINDINGS: Mild cardiac enlargement. No central vascular congestion. Moderate hiatal hernia. Both lungs are clear. Surgical clips project over the left breast. IMPRESSION: 1. Mild cardiac enlargement without focal consolidation or overt pulmonary edema. 2. Moderate hiatal hernia. Electronically Signed   By: Dahlia Bailiff MD   On: 07/25/2020 21:35    EKG: Independently reviewed.   Assessment/Plan Principal Problem:   Cellulitis of foot, right   Cellulitis of right leg Had a low-grade fever on presentation. Observation/telemetry. Continue local care. Analgesics as needed. Continue Ancef per pharmacy notification. Follow-up blood culture and sensitivity.  Active Problems:   Stage 3a  chronic kidney disease (HCC) Monitor renal function electrolytes.    HTN (hypertension) Continue benazepril 20 mg p.o. daily. Continue furosemide 20 mg p.o. daily. Monitor BP, renal function and electrolytes.    Normocytic anemia Check anemia panel. Monitor hematocrit and hemoglobin. Transfuse as needed.   DVT prophylaxis: Lovenox SQ. Code Status:   Full code. Family Communication: Disposition Plan:   Patient is from:  Home.  Anticipated DC to:  TBD.  Anticipated DC date:  07/27/2020 or 07/28/2020.  Anticipated DC barriers: Clinical status/discharge planning..  Consults called: Admission status:  Observation/telemetry.   Severity of Illness: High severity due to presenting with low-grade fever in the setting of right lower extremity cellulitis.  Reubin Milan MD [facility]  How to contact the [facility] Attending or Consulting provider [place] or covering provider during after hours [place], for this patient?   1. Check the care team in [facility] and look for a) attending/consulting [facility] provider listed and b) the [facility] team listed 2. Log into [email or web address] and use [place]'s universal password to access. If you do not have the password, please contact the hospital operator. 3. Locate the [facility] provider you are looking for under [facility] and page to a number that you can be directly reached. 4. If you still have difficulty reaching the provider, please page the [facility] (Director on Call) for the Hospitalists listed on amion for assistance.  07/25/2020, 11:36 PM   This document was prepared using Dragon voice recognition software and may contain some unintended transcription errors.

## 2020-07-25 NOTE — Progress Notes (Addendum)
PHARMACY NOTE:  ANTIMICROBIAL RENAL DOSAGE ADJUSTMENT  Current antimicrobial regimen includes a mismatch between antimicrobial dosage and estimated renal function.  As per policy approved by the Pharmacy & Therapeutics and Medical Executive Committees, the antimicrobial dosage will be adjusted accordingly.  Current antimicrobial dosage:  Cefazolin 1gm IV q8h  Indication: Cellulitis  Renal Function:  Estimated Creatinine Clearance: 27.9 mL/min (A) (by C-G formula based on SCr of 1.12 mg/dL (H)).     Antimicrobial dosage has been changed to:  Cefazolin 1gm IV q24h   Thank you for allowing pharmacy to be a part of this patient's care.  Everette Rank, [facility] 07/25/2020 11:47 PM

## 2020-07-26 ENCOUNTER — Encounter (HOSPITAL_COMMUNITY): Payer: Self-pay | Admitting: Internal Medicine

## 2020-07-26 DIAGNOSIS — L03115 Cellulitis of right lower limb: Secondary | ICD-10-CM | POA: Diagnosis not present

## 2020-07-26 DIAGNOSIS — N179 Acute kidney failure, unspecified: Secondary | ICD-10-CM | POA: Diagnosis not present

## 2020-07-26 DIAGNOSIS — N1831 Chronic kidney disease, stage 3a: Secondary | ICD-10-CM

## 2020-07-26 DIAGNOSIS — I10 Essential (primary) hypertension: Secondary | ICD-10-CM

## 2020-07-26 DIAGNOSIS — N1832 Chronic kidney disease, stage 3b: Secondary | ICD-10-CM | POA: Diagnosis not present

## 2020-07-26 LAB — CBC
HCT: 27.9 % — ABNORMAL LOW (ref 36.0–46.0)
Hemoglobin: 8.5 g/dL — ABNORMAL LOW (ref 12.0–15.0)
MCH: 27.1 pg (ref 26.0–34.0)
MCHC: 30.5 g/dL (ref 30.0–36.0)
MCV: 88.9 fL (ref 80.0–100.0)
Platelets: 252 10*3/uL (ref 150–400)
RBC: 3.14 MIL/uL — ABNORMAL LOW (ref 3.87–5.11)
RDW: 14.9 % (ref 11.5–15.5)
WBC: 6 10*3/uL (ref 4.0–10.5)
nRBC: 0 % (ref 0.0–0.2)

## 2020-07-26 LAB — COMPREHENSIVE METABOLIC PANEL
ALT: 14 U/L (ref 0–44)
AST: 13 U/L — ABNORMAL LOW (ref 15–41)
Albumin: 3 g/dL — ABNORMAL LOW (ref 3.5–5.0)
Alkaline Phosphatase: 47 U/L (ref 38–126)
Anion gap: 8 (ref 5–15)
BUN: 38 mg/dL — ABNORMAL HIGH (ref 8–23)
CO2: 26 mmol/L (ref 22–32)
Calcium: 8.3 mg/dL — ABNORMAL LOW (ref 8.9–10.3)
Chloride: 106 mmol/L (ref 98–111)
Creatinine, Ser: 1.38 mg/dL — ABNORMAL HIGH (ref 0.44–1.00)
GFR, Estimated: 37 mL/min — ABNORMAL LOW (ref >60)
Glucose, Bld: 166 mg/dL — ABNORMAL HIGH (ref 70–99)
Potassium: 4.8 mmol/L (ref 3.5–5.1)
Sodium: 140 mmol/L (ref 135–145)
Total Bilirubin: 0.5 mg/dL (ref 0.3–1.2)
Total Protein: 5.9 g/dL — ABNORMAL LOW (ref 6.5–8.1)

## 2020-07-26 LAB — RETICULOCYTES
Immature Retic Fract: 12.8 % (ref 2.3–15.9)
RBC.: 3.14 MIL/uL — ABNORMAL LOW (ref 3.87–5.11)
Retic Count, Absolute: 50.9 10*3/uL (ref 19.0–186.0)
Retic Ct Pct: 1.6 % (ref 0.4–3.1)

## 2020-07-26 LAB — IRON AND TIBC
Iron: 17 ug/dL — ABNORMAL LOW (ref 28–170)
Saturation Ratios: 6 % — ABNORMAL LOW (ref 10.4–31.8)
TIBC: 297 ug/dL (ref 250–450)
UIBC: 280 ug/dL

## 2020-07-26 LAB — VITAMIN B12: Vitamin B-12: 6920 pg/mL — ABNORMAL HIGH (ref 180–914)

## 2020-07-26 LAB — FOLATE: Folate: 39.1 ng/mL (ref >5.9)

## 2020-07-26 LAB — FERRITIN: Ferritin: 22 ng/mL (ref 11–307)

## 2020-07-26 MED ORDER — DEXTROSE 5 % IV SOLN
1500.0000 mg | Freq: Once | INTRAVENOUS | Status: AC
  Administered 2020-07-26: 1500 mg via INTRAVENOUS
  Filled 2020-07-26: qty 75

## 2020-07-26 MED ORDER — CEFAZOLIN SODIUM-DEXTROSE 1-4 GM/50ML-% IV SOLN
1.0000 g | INTRAVENOUS | Status: DC
  Filled 2020-07-26: qty 50

## 2020-07-26 MED ORDER — DIPHENHYDRAMINE HCL 12.5 MG/5ML PO ELIX
12.5000 mg | ORAL_SOLUTION | Freq: Three times a day (TID) | ORAL | Status: AC | PRN
  Administered 2020-07-26 – 2020-07-27 (×3): 12.5 mg via ORAL
  Filled 2020-07-26 (×3): qty 5

## 2020-07-26 MED ORDER — FENTANYL CITRATE (PF) 100 MCG/2ML IJ SOLN
50.0000 ug | INTRAMUSCULAR | Status: DC | PRN
  Administered 2020-07-26 – 2020-07-29 (×3): 50 ug via INTRAVENOUS
  Filled 2020-07-26 (×4): qty 2

## 2020-07-26 MED ORDER — ASCORBIC ACID 500 MG PO TABS
250.0000 mg | ORAL_TABLET | Freq: Every day | ORAL | Status: DC
  Administered 2020-07-26 – 2020-08-03 (×9): 250 mg via ORAL
  Filled 2020-07-26 (×9): qty 1

## 2020-07-26 MED ORDER — OXYCODONE HCL 5 MG PO TABS
2.5000 mg | ORAL_TABLET | Freq: Four times a day (QID) | ORAL | Status: DC | PRN
  Administered 2020-07-26 – 2020-07-29 (×5): 2.5 mg via ORAL
  Filled 2020-07-26 (×5): qty 1

## 2020-07-26 MED ORDER — CEFAZOLIN SODIUM-DEXTROSE 1-4 GM/50ML-% IV SOLN
1.0000 g | Freq: Once | INTRAVENOUS | Status: AC
  Administered 2020-07-26: 1 g via INTRAVENOUS
  Filled 2020-07-26: qty 50

## 2020-07-26 NOTE — Consult Note (Signed)
WOC Nurse Consult Note: Patient receiving care in [facility] 314. Reason for Consult: RLE cellulitis and venous insufficiency Wound type: venous related and cellulitis Pressure Injury POA: Yes/No/NA Measurement: Wound bed: see photo Drainage (amount, consistency, odor)  Periwound: Dressing procedure/placement/frequency: Wash right leg with soap and water. Pat dry. Apply Sween Moisturizing Ointment. Place Xeroform over any intact or ruptured blisters and any weeping areas [name] [phone]). Beginning behind the toes and going to just below the knees, spiral wrap kerlex, then 4 inch ace wrap. Perform daily.  [place] nurse will not follow at this time.  Please re-consult the [place] team if needed.  Val Riles, RN, MSN, CWOCN, CNS-BC, pager [phone]

## 2020-07-26 NOTE — Progress Notes (Signed)
PROGRESS NOTE    Erin [name]  UJW:[id number] DOB: December 05, 1930 DOA: 07/25/2020 PCP: Monico Blitz, MD   Chief Complaint  Patient presents with  . Wound Check    Brief Narrative:  As per H&P written by Dr. Olevia Bowens on 07/25/20 Erin [name] is a 87 y.o. female with medical history significant of hypertension, left breast cancer, history of cystitis, severe impaired hearing, lower extremity venous dialysis with chronic wounds who is brought to the emergency department from home by her brother due to worsening right lower extremity wound.  She was seen in the ED and received dalbavancin on 07/15/2020 and was subsequently discharged home.  She follow-up with Dr. Megan Salon from [place] on 07/21/2020 who did not recommend further antibiotics.  However, the patient lives by herself and has been struggling to get her compression wraps and stockings at home.  She does not have home health.  Over the past 2 days, she has had progressively worse pain, particularly on the foot, which has severely impaired her mobility as she is unable to bear weight on her RLE.  Her brother was concerned, we went through her home and brought her to the emergency department.  She was found to have a low-grade fever on arrival.  She otherwise denies headache, rhinorrhea, sore throat, wheezing or hemoptysis.  No chest pain, palpitations, diaphoresis, PND orthopnea.  She gets frequent lower extremity edema.  Denies abdominal pain, appetite changes, nausea, emesis, diarrhea, constipation, melena or hematochezia.  No dysuria or hematuria.  No polyuria, polydipsia, polyphagia or blurred vision.  ED Course: Initial vital signs were temperature 100.2 F, pulse 101, respirations 32, BP 173/96 mmHg and O2 sat 93% on room air.  The patient received Ceftin Myophen 650 mg p.o. x1, hydromorphone 0.5 mg IVP, ondansetron 4 mg IVP and cefazolin 1 g IVPB.   Assessment & Plan: 1-Cellulitis of foot, right -following cellulitis protocol  patient will be treated with Dalvance -keep limb elevated -continue PRN analgesics -wound care consulted for skin care and assistance with management of underlying venous insufficiency process; -patient needs wrapping tx  2-HTN (hypertension) -continue current antihypertensive regimen   3-Stage 3b chronic kidney disease ([place]) -stable and compensated -will follow renal function trend -avoid nephrotoxic agents  4-hx of glaucoma -continue latanoprost eye drops  5-anemia of chronic kidney disease  -stable overall -no signs of overt bleeding -continue monitoring Hgb trend  6-physical deconditioning -CM/TOC consulted to assist with placement.   DVT prophylaxis: lovenox Code Status: Full code. Family Communication: no family at bedside and unable to reach over the phone. Disposition:   Status is: kept in Observation for now due to antibiotic used (in order for her to qualify for coverage)  Dispo: The patient is from: home              Anticipated d/c is to: SNF              Patient currently medically stable and in need of SNF for further venous stasis/RLE cellulitis care. Patient lives along and currently unable to perform her ADL's and care for RLE cellulitis and chronic venous insufficiency process.    Difficult to place patient no    Consultants:   Wound care service.   Procedures:   See below for x-ray reports.   Antimicrobials:  Initially on cefazolin Dalvance X 1 given.   Subjective: Afebrile, hemodynamically stable and in no acute distress. Complaining of right LE/foot pain and swelling.   Objective: Vitals:   07/26/20 0402 07/26/20  1740 07/26/20 1133 07/26/20 1317  BP: (!) 121/47 (!) 121/56 (!) 143/96 (!) 137/54  Pulse: 86 73 89 89  Resp: 18 16 18 18   Temp: 98.6 F (37 C) 98.2 F (36.8 C) 98.2 F (36.8 C) 98.9 F (37.2 C)  TempSrc:  Oral Oral Oral  SpO2: 92% 98% 97% 98%  Weight:      Height:        Intake/Output Summary (Last 24 hours) at  07/26/2020 2104 Last data filed at 07/26/2020 1821 Gross per 24 hour  Intake 1130 ml  Output --  Net 1130 ml   Filed Weights   07/25/20 1737  Weight: 56.7 kg    Examination:  General exam: Appears calm and in no major distress; denies CP, SOB, nausea and vomiting. Complaining of right foot pain. Very hard of hearing  Respiratory system: Clear to auscultation. Respiratory effort normal. Cardiovascular system: S1 & S2 heard, rate controlled, no rubs, no gallops.  Gastrointestinal system: Abdomen is nondistended, soft and nontender. No organomegaly or masses felt. Normal bowel sounds heard. Central nervous system: Alert and oriented. No focal neurological deficits. Extremities/skin: no cyanosis appreciated. Patient severe erythematous changes and swelling on her right LE and foot; active weeping and ongoing desquamation.  LLE with positive 1+ edema, no signs of infections. Positive stasis dermatitis and venous insufficiency on both legs.  Psychiatry: Mood & affect appropriate.     Data Reviewed: I have personally reviewed following labs and imaging studies  CBC: Recent Labs  Lab 07/25/20 1800 07/26/20 0541  WBC 7.6 6.0  NEUTROABS 6.5  --   HGB 9.6* 8.5*  HCT 31.0* 27.9*  MCV 88.3 88.9  PLT 284 814    Basic Metabolic Panel: Recent Labs  Lab 07/25/20 1800 07/26/20 0541  NA 138 140  K 4.8 4.8  CL 105 106  CO2 25 26  GLUCOSE 169* 166*  BUN 36* 38*  CREATININE 1.12* 1.38*  CALCIUM 9.5 8.3*    GFR: Estimated Creatinine Clearance: 22.7 mL/min (A) (by C-G formula based on SCr of 1.38 mg/dL (H)).  Liver Function Tests: Recent Labs  Lab 07/26/20 0541  AST 13*  ALT 14  ALKPHOS 47  BILITOT 0.5  PROT 5.9*  ALBUMIN 3.0*    CBG: No results for input(s): GLUCAP in the last 168 hours.   Recent Results (from the past 240 hour(s))  Culture, blood (Routine X 2) w Reflex to ID Panel     Status: None (Preliminary result)   Collection Time: 07/25/20  5:50 PM    Specimen: BLOOD  Result Value Ref [place] Status   Specimen Description BLOOD RIGHT ANTECUBITAL  Final   Special Requests   Final    BOTTLES DRAWN AEROBIC AND ANAEROBIC Blood Culture adequate volume   Culture   Final    NO GROWTH < 12 HOURS Performed at [facility], [address] [place], [place] [address]    Report Status PENDING  Incomplete  Culture, blood (Routine X 2) w Reflex to ID Panel     Status: None (Preliminary result)   Collection Time: 07/25/20  6:00 PM   Specimen: BLOOD  Result Value Ref Range Status   Specimen Description BLOOD LEFT ANTECUBITAL  Final   Special Requests   Final    BOTTLES DRAWN AEROBIC AND ANAEROBIC Blood Culture adequate volume   Culture   Final    NO GROWTH < 12 HOURS Performed at [facility], [address], [place], [place] [address]    Report Status  PENDING  Incomplete  Resp Panel by RT-PCR (Flu A&B, Covid) Nasopharyngeal Swab     Status: None   Collection Time: 07/25/20  9:08 PM   Specimen: Nasopharyngeal Swab; Nasopharyngeal(NP) swabs in vial transport medium  Result Value Ref Range Status   SARS Coronavirus 2 by RT PCR NEGATIVE NEGATIVE Final    Comment: (NOTE) SARS-CoV-2 target nucleic acids are NOT DETECTED.  The SARS-CoV-2 RNA is generally detectable in upper respiratory specimens during the acute phase of infection. The lowest concentration of SARS-CoV-2 viral copies this assay can detect is 138 copies/mL. A negative result does not preclude SARS-Cov-2 infection and should not be used as the sole basis for treatment or other patient management decisions. A negative result may occur with  improper specimen collection/handling, submission of specimen other than nasopharyngeal swab, presence of viral mutation(s) within the areas targeted by this assay, and inadequate number of viral copies(<138 copies/mL). A negative result must be combined with clinical observations, patient history, and epidemiological information. The expected  result is Negative.  Fact Sheet for Patients:  EntrepreneurPulse.com.au  Fact Sheet for Healthcare Providers:  IncredibleEmployment.be  This test is no t yet approved or cleared by the [place] FDA and  has been authorized for detection and/or diagnosis of SARS-CoV-2 by FDA under an Emergency Use Authorization (EUA). This EUA will remain  in effect (meaning this test can be used) for the duration of the COVID-19 declaration under Section 564(b)(1) of the Act, 21 U.S.C.section 360bbb-3(b)(1), unless the authorization is terminated  or revoked sooner.       Influenza A by PCR NEGATIVE NEGATIVE Final   Influenza B by PCR NEGATIVE NEGATIVE Final    Comment: (NOTE) The Xpert Xpress SARS-CoV-2/FLU/RSV plus assay is intended as an aid in the diagnosis of influenza from Nasopharyngeal swab specimens and should not be used as a sole basis for treatment. Nasal washings and aspirates are unacceptable for Xpert Xpress SARS-CoV-2/FLU/RSV testing.  Fact Sheet for Patients: EntrepreneurPulse.com.au  Fact Sheet for Healthcare Providers: IncredibleEmployment.be  This test is not yet approved or cleared by the [place] FDA and has been authorized for detection and/or diagnosis of SARS-CoV-2 by FDA under an Emergency Use Authorization (EUA). This EUA will remain in effect (meaning this test can be used) for the duration of the COVID-19 declaration under Section 564(b)(1) of the Act, 21 U.S.C. section 360bbb-3(b)(1), unless the authorization is terminated or revoked.  Performed at [facility], [address], [place], [place] [address]       Radiology Studies: [facility] Chest [facility] 1 View  Result Date: 07/25/2020 CLINICAL DATA:  Cough EXAM: PORTABLE CHEST 1 VIEW COMPARISON:  None. FINDINGS: Mild cardiac enlargement. No central vascular congestion. Moderate hiatal hernia. Both lungs are clear. Surgical clips project  over the left breast. IMPRESSION: 1. Mild cardiac enlargement without focal consolidation or overt pulmonary edema. 2. Moderate hiatal hernia. Electronically Signed   By: Dahlia Bailiff MD   On: 07/25/2020 21:35    Scheduled Meds: . vitamin C  250 mg Oral Daily  . aspirin  81 mg Oral Daily  . benazepril  20 mg Oral Daily  . enoxaparin (LOVENOX) injection  30 mg Subcutaneous Q24H  . furosemide  20 mg Oral Daily  . latanoprost  1 drop Both Eyes QHS   Continuous Infusions:   LOS: 0 days    Time spent: 30 minutes   Barton Dubois, MD [facility]   To contact the attending provider between 7A-7P or the covering provider  during after hours 7P-7A, please log into the web site [email or web address] and access using universal [place] password for that web site. If you do not have the password, please call the hospital operator.  07/26/2020, 9:04 PM

## 2020-07-27 DIAGNOSIS — I10 Essential (primary) hypertension: Secondary | ICD-10-CM | POA: Diagnosis not present

## 2020-07-27 DIAGNOSIS — R5381 Other malaise: Secondary | ICD-10-CM

## 2020-07-27 DIAGNOSIS — L03115 Cellulitis of right lower limb: Secondary | ICD-10-CM | POA: Diagnosis not present

## 2020-07-27 DIAGNOSIS — D649 Anemia, unspecified: Secondary | ICD-10-CM | POA: Diagnosis not present

## 2020-07-27 MED ORDER — POLYETHYLENE GLYCOL 3350 17 G PO PACK
17.0000 g | PACK | Freq: Every day | ORAL | Status: DC
  Administered 2020-07-28 – 2020-08-02 (×6): 17 g via ORAL
  Filled 2020-07-27 (×7): qty 1

## 2020-07-27 MED ORDER — DOCUSATE SODIUM 100 MG PO CAPS
100.0000 mg | ORAL_CAPSULE | Freq: Two times a day (BID) | ORAL | Status: DC
  Administered 2020-07-27 – 2020-08-03 (×14): 100 mg via ORAL
  Filled 2020-07-27 (×14): qty 1

## 2020-07-27 NOTE — Care Management Obs Status (Signed)
[place] NOTIFICATION   Patient Details  Name: MIIA BLANKS MRN: [id number] Date of Birth: November 24, 1930   Medicare Observation Status Notification Given:  Yes    Boneta Lucks, RN 07/27/2020, 2:24 PM

## 2020-07-27 NOTE — Plan of Care (Signed)
  Problem: Acute Rehab PT Goals(only PT should resolve) Goal: Pt Will Go Supine/Side To Sit Outcome: Progressing Flowsheets (Taken 07/27/2020 1228) Pt will go Supine/Side to Sit:  with minimal assist  with moderate assist Goal: Patient Will Transfer Sit To/From Stand Outcome: Progressing Flowsheets (Taken 07/27/2020 1228) Patient will transfer sit to/from stand: with moderate assist Goal: Pt Will Transfer Bed To Chair/Chair To Bed Outcome: Progressing Flowsheets (Taken 07/27/2020 1228) Pt will Transfer Bed to Chair/Chair to Bed: with mod assist Goal: Pt Will Ambulate Outcome: Progressing Flowsheets (Taken 07/27/2020 1228) Pt will Ambulate:  10 feet  with moderate assist  with rolling walker   12:28 PM, 07/27/20 Lonell Grandchild, MPT Physical Therapist with [phone] office [phone] mobile phone

## 2020-07-27 NOTE — Evaluation (Signed)
Physical Therapy Evaluation Patient Details Name: Erin [name] MRN: [id number] DOB: [birth year] Today's Date: 07/27/2020   History of Present Illness  Erin [name] is a 87 y.o. female with medical history significant of hypertension, left breast cancer, history of cystitis, severe impaired hearing, lower extremity venous dialysis with chronic wounds who is brought to the emergency department from home by her brother due to worsening right lower extremity wound.  She was seen in the ED and received dalbavancin on 07/15/2020 and was subsequently discharged home.  She follow-up with Dr. Megan Salon from [place] on 07/21/2020 who did not recommend further antibiotics.  However, the patient lives by herself and has been struggling to get her compression wraps and stockings at home.  She does not have home health.  Over the past 2 days, she has had progressively worse pain, particularly on the foot, which has severely impaired her mobility as she is unable to bear weight on her RLE.  Her brother was concerned, we went through her home and brought her to the emergency department.  She was found to have a low-grade fever on arrival.  She otherwise denies headache, rhinorrhea, sore throat, wheezing or hemoptysis.  No chest pain, palpitations, diaphoresis, PND orthopnea.  She gets frequent lower extremity edema.  Denies abdominal pain, appetite changes, nausea, emesis, diarrhea, constipation, melena or hematochezia.  No dysuria or hematuria.  No polyuria, polydipsia, polyphagia or blurred vision.    Clinical Impression  Patient demonstrates slow labored movement for sitting up at bedside with c/o severe pain right foot with any pressure and also had discomfort left foot with movement.  Patient unable to tolerate weighbearing on RLE due to increased pain when attempting to stand with RW and required stand pivot with with left knee blocked to transfer to chair.  Patient tolerated sitting up in chair after therapy  - nursing staff aware.  Patient will benefit from continued physical therapy in hospital and recommended venue below to increase strength, balance, endurance for safe ADLs and gait.      Follow Up Recommendations SNF    Equipment Recommendations  None recommended by PT    Recommendations for Other Services       Precautions / Restrictions Precautions Precautions: Fall Restrictions Weight Bearing Restrictions: No      Mobility  Bed Mobility Overal bed mobility: Needs Assistance Bed Mobility: Supine to Sit     Supine to sit: Mod assist;Max assist     General bed mobility comments: increased time, labored movement    Transfers Overall transfer level: Needs assistance Equipment used: Rolling walker (2 wheeled) Transfers: Sit to/from [facility] Sit to Stand: Max assist Stand pivot transfers: Max assist       General transfer comment: patient unable to tolerate weighbearing on RLE due to increased pain, required stand pivot with with left knee blocked to transfer to chair.  Ambulation/Gait                Stairs            Wheelchair Mobility    Modified Rankin (Stroke Patients Only)       Balance Overall balance assessment: Needs assistance Sitting-balance support: Feet supported;No upper extremity supported Sitting balance-Leahy Scale: Fair Sitting balance - Comments: fair/good seated at EOB   Standing balance support: During functional activity;Bilateral upper extremity supported Standing balance-Leahy Scale: Poor Standing balance comment: unable to maintain standing balance using RW due to severe pain mostly in right foot  Pertinent Vitals/Pain Pain Assessment: 0-10 Pain Score: 5  Pain Location: right foot that increases with movement, pressure Pain Descriptors / Indicators: Sore;Grimacing;Guarding Pain Intervention(s): Limited activity within patient's tolerance;Monitored during  session;Repositioned    Home Living Family/patient expects to be discharged to:: Private residence Living Arrangements: Alone Available Help at Discharge: Family;Available PRN/intermittently Type of Home: House Home Access: Stairs to enter Entrance Stairs-Rails: Right;Left;Can reach both Entrance Stairs-Number of Steps: 5 Home Layout: One level Home Equipment: Walker - 4 wheels;Wheelchair - manual Additional Comments: Patient states she takes sink baths    Prior Function Level of Independence: Needs assistance   Gait / Transfers Assistance Needed: household Geologist, engineering, recently using wheelchair mostly  ADL's / Homemaking Assistance Needed: assisted by family        Hand Dominance        Extremity/Trunk Assessment   Upper Extremity Assessment Upper Extremity Assessment: Generalized weakness    Lower Extremity Assessment Lower Extremity Assessment: RLE deficits/detail RLE Deficits / Details: grossly 2+/5 RLE: Unable to fully assess due to pain RLE Sensation: WNL RLE Coordination: WNL    Cervical / Trunk Assessment Cervical / Trunk Assessment: Normal  Communication   Communication: HOH;Other (comment) (can't hear, has to read lips to communicate)  Cognition Arousal/Alertness: Awake/alert Behavior During Therapy: WFL for tasks assessed/performed Overall Cognitive Status: Within Functional Limits for tasks assessed                                        General Comments      Exercises     Assessment/Plan    PT Assessment Patient needs continued PT services  PT Problem List Decreased strength;Decreased activity tolerance;Decreased balance;Decreased mobility       PT Treatment Interventions DME instruction;Gait training;Stair training;Functional mobility training;Therapeutic activities;Therapeutic exercise;Patient/family education;Balance training    PT Goals (Current goals can be found in the Care Plan section)  Acute Rehab PT  Goals Patient Stated Goal: return home after rehab PT Goal Formulation: With patient Time For Goal Achievement: 08/10/20 Potential to Achieve Goals: Good    Frequency Min 3X/week   Barriers to discharge        Co-evaluation               AM-PAC PT "6 Clicks" Mobility  Outcome Measure Help needed turning from your back to your side while in a flat bed without using bedrails?: A Lot Help needed moving from lying on your back to sitting on the side of a flat bed without using bedrails?: A Lot Help needed moving to and from a bed to a chair (including a wheelchair)?: A Lot Help needed standing up from a chair using your arms (e.g., wheelchair or bedside chair)?: A Lot Help needed to walk in hospital room?: Total Help needed climbing 3-5 steps with a railing? : Total 6 Click Score: 10    End of Session   Activity Tolerance: Patient tolerated treatment well;Patient limited by fatigue;Patient limited by pain Patient left: in chair;with call bell/phone within reach;with chair alarm set Nurse Communication: Mobility status PT Visit Diagnosis: Unsteadiness on feet (R26.81);Other abnormalities of gait and mobility (R26.89);Muscle weakness (generalized) (M62.81)    Time: 1610-9604 PT Time Calculation (min) (ACUTE ONLY): 26 min   Charges:   PT Evaluation $PT Eval Moderate Complexity: 1 Mod PT Treatments $Therapeutic Activity: 23-37 mins        12:26 PM, 07/27/20 Lonell Grandchild,  MPT Physical Therapist with [facility] 336 [phone] office [phone] mobile phone

## 2020-07-27 NOTE — Progress Notes (Signed)
PROGRESS NOTE    Erin [name]  NFA:[id number] DOB: December 17, 1930 DOA: 07/25/2020 PCP: Monico Blitz, MD   Chief Complaint  Patient presents with  . Wound Check    Brief Narrative:  As per H&P written by Dr. Olevia Bowens on 07/25/20 Erin [name] is a 87 y.o. female with medical history significant of hypertension, left breast cancer, history of cystitis, severe impaired hearing, lower extremity venous dialysis with chronic wounds who is brought to the emergency department from home by her brother due to worsening right lower extremity wound.  She was seen in the ED and received dalbavancin on 07/15/2020 and was subsequently discharged home.  She follow-up with Dr. Megan Salon from [place] on 07/21/2020 who did not recommend further antibiotics.  However, the patient lives by herself and has been struggling to get her compression wraps and stockings at home.  She does not have home health.  Over the past 2 days, she has had progressively worse pain, particularly on the foot, which has severely impaired her mobility as she is unable to bear weight on her RLE.  Her brother was concerned, we went through her home and brought her to the emergency department.  She was found to have a low-grade fever on arrival.  She otherwise denies headache, rhinorrhea, sore throat, wheezing or hemoptysis.  No chest pain, palpitations, diaphoresis, PND orthopnea.  She gets frequent lower extremity edema.  Denies abdominal pain, appetite changes, nausea, emesis, diarrhea, constipation, melena or hematochezia.  No dysuria or hematuria.  No polyuria, polydipsia, polyphagia or blurred vision.  ED Course: Initial vital signs were temperature 100.2 F, pulse 101, respirations 32, BP 173/96 mmHg and O2 sat 93% on room air.  The patient received Ceftin Myophen 650 mg p.o. x1, hydromorphone 0.5 mg IVP, ondansetron 4 mg IVP and cefazolin 1 g IVPB.   Assessment & Plan: 1-Cellulitis of foot, right -following cellulitis protocol  patient will be treated with Dalvance -No fever and normal WBCs appreciated on examination. -Continue to keep limb elevated as much as possible -continue PRN analgesics -Appreciate wound care service consultation; will follow recommendations  -Leg has been wrapped according to instructions with already appreciated improvement in swelling and erythematous changes.  2-HTN (hypertension) -continue current antihypertensive regimen  -Heart healthy diet discussed with patient.  3-Stage 3b chronic kidney disease ([place]) -stable and compensated -will continue to follow renal function trend -Continue to avoid nephrotoxic agents  4-hx of glaucoma -continue latanoprost eye drops  5-anemia of chronic kidney disease  -stable overall -no signs of overt bleeding -continue monitoring Hgb trend intermittently.  6-physical deconditioning -CM/TOC consulted to assist with placement. -Physical therapy has evaluated patient and is recommending a skilled nursing facility for rehab and conditioning.   DVT prophylaxis: lovenox Code Status: Full code. Family Communication: no family at bedside Disposition:   Status is: kept in Observation for now due to antibiotic used (in order for her to qualify for coverage)  Dispo: The patient is from: home              Anticipated d/c is to: SNF              Patient currently medically stable and in need of SNF for further venous stasis/RLE cellulitis care. Patient lives along and currently unable to perform her ADL's and care for RLE cellulitis and chronic venous insufficiency process.    Difficult to place patient no    Consultants:   Wound care service.   Procedures:   See below for  x-ray reports.   Antimicrobials:  Initially on cefazolin Dalvance X 1 given.   Subjective: No fever, no chest pain, no nausea, no vomiting.  Reports improvement in pain and swelling of right lower extremity after wrapping management apply.  Objective: Vitals:    07/26/20 1133 07/26/20 1317 07/26/20 2143 07/27/20 0512  BP: (!) 143/96 (!) 137/54 (!) 130/47 (!) 114/49  Pulse: 89 89 82 72  Resp: 18 18 16 18   Temp: 98.2 F (36.8 C) 98.9 F (37.2 C)    TempSrc: Oral Oral    SpO2: 97% 98% 96% 98%  Weight:      Height:        Intake/Output Summary (Last 24 hours) at 07/27/2020 1322 Last data filed at 07/27/2020 0900 Gross per 24 hour  Intake 720 ml  Output 800 ml  Net -80 ml   Filed Weights   07/25/20 1737  Weight: 56.7 kg    Examination: General exam: Alert, awake, oriented x 3; very hard of hearing during evaluation (mainly reading lips); reports no fever, no chest pain, no nausea, no vomiting.  Patient expressed improvement in the pain and swelling on her right lower extremity.  Feeling weak and tired. Respiratory system: Clear to auscultation. Respiratory effort normal.  No using accessory muscles.  Good air movement bilaterally. Cardiovascular system:RRR.  No rubs, no gallops, no JVD. Gastrointestinal system: Abdomen is nondistended, soft and nontender. No organomegaly or masses felt. Normal bowel sounds heard. Central nervous system: Alert and oriented. No focal neurological deficits. Extremities: No cyanosis appreciated.  Cornelia in her right lower extremity in place.  Appreciative improvement in overall swelling and erythematous changes visualized.  Patient also reporting less pain. Skin: Chronic stasis dermatitis and venous insufficiency changes appreciated on both lower extremities. Psychiatry: Mood & affect appropriate.    Data Reviewed: I have personally reviewed following labs and imaging studies  CBC: Recent Labs  Lab 07/25/20 1800 07/26/20 0541  WBC 7.6 6.0  NEUTROABS 6.5  --   HGB 9.6* 8.5*  HCT 31.0* 27.9*  MCV 88.3 88.9  PLT 284 426    Basic Metabolic Panel: Recent Labs  Lab 07/25/20 1800 07/26/20 0541  NA 138 140  K 4.8 4.8  CL 105 106  CO2 25 26  GLUCOSE 169* 166*  BUN 36* 38*  CREATININE 1.12*  1.38*  CALCIUM 9.5 8.3*    GFR: Estimated Creatinine Clearance: 22.7 mL/min (A) (by C-G formula based on SCr of 1.38 mg/dL (H)).  Liver Function Tests: Recent Labs  Lab 07/26/20 0541  AST 13*  ALT 14  ALKPHOS 47  BILITOT 0.5  PROT 5.9*  ALBUMIN 3.0*    CBG: No results for input(s): GLUCAP in the last 168 hours.   Recent Results (from the past 240 hour(s))  Culture, blood (Routine X 2) w Reflex to ID Panel     Status: None (Preliminary result)   Collection Time: 07/25/20  5:50 PM   Specimen: BLOOD  Result Value Ref Range Status   Specimen Description BLOOD RIGHT ANTECUBITAL  Final   Special Requests   Final    BOTTLES DRAWN AEROBIC AND ANAEROBIC Blood Culture adequate volume   Culture   Final    NO GROWTH 2 DAYS Performed at [facility], [address] [place], [place] [address]    Report Status PENDING  Incomplete  Culture, blood (Routine X 2) w Reflex to ID Panel     Status: None (Preliminary result)   Collection Time: 07/25/20  6:00 PM  Specimen: BLOOD  Result Value Ref Range Status   Specimen Description BLOOD LEFT ANTECUBITAL  Final   Special Requests   Final    BOTTLES DRAWN AEROBIC AND ANAEROBIC Blood Culture adequate volume   Culture   Final    NO GROWTH 2 DAYS Performed at [facility], [address] [place], [place] [address]    Report Status PENDING  Incomplete  Resp Panel by RT-PCR (Flu A&B, Covid) Nasopharyngeal Swab     Status: None   Collection Time: 07/25/20  9:08 PM   Specimen: Nasopharyngeal Swab; Nasopharyngeal(NP) swabs in vial transport medium  Result Value Ref Range Status   SARS Coronavirus 2 by RT PCR NEGATIVE NEGATIVE Final    Comment: (NOTE) SARS-CoV-2 target nucleic acids are NOT DETECTED.  The SARS-CoV-2 RNA is generally detectable in upper respiratory specimens during the acute phase of infection. The lowest concentration of SARS-CoV-2 viral copies this assay can detect is 138 copies/mL. A negative result does not preclude  SARS-Cov-2 infection and should not be used as the sole basis for treatment or other patient management decisions. A negative result may occur with  improper specimen collection/handling, submission of specimen other than nasopharyngeal swab, presence of viral mutation(s) within the areas targeted by this assay, and inadequate number of viral copies(<138 copies/mL). A negative result must be combined with clinical observations, patient history, and epidemiological information. The expected result is Negative.  Fact Sheet for Patients:  EntrepreneurPulse.com.au  Fact Sheet for Healthcare Providers:  IncredibleEmployment.be  This test is no t yet approved or cleared by the [place] FDA and  has been authorized for detection and/or diagnosis of SARS-CoV-2 by FDA under an Emergency Use Authorization (EUA). This EUA will remain  in effect (meaning this test can be used) for the duration of the COVID-19 declaration under Section 564(b)(1) of the Act, 21 U.S.C.section 360bbb-3(b)(1), unless the authorization is terminated  or revoked sooner.       Influenza A by PCR NEGATIVE NEGATIVE Final   Influenza B by PCR NEGATIVE NEGATIVE Final    Comment: (NOTE) The Xpert Xpress SARS-CoV-2/FLU/RSV plus assay is intended as an aid in the diagnosis of influenza from Nasopharyngeal swab specimens and should not be used as a sole basis for treatment. Nasal washings and aspirates are unacceptable for Xpert Xpress SARS-CoV-2/FLU/RSV testing.  Fact Sheet for Patients: EntrepreneurPulse.com.au  Fact Sheet for Healthcare Providers: IncredibleEmployment.be  This test is not yet approved or cleared by the [place] FDA and has been authorized for detection and/or diagnosis of SARS-CoV-2 by FDA under an Emergency Use Authorization (EUA). This EUA will remain in effect (meaning this test can be used) for the duration of  the COVID-19 declaration under Section 564(b)(1) of the Act, 21 U.S.C. section 360bbb-3(b)(1), unless the authorization is terminated or revoked.  Performed at [facility], [address], [place], [place] [address]       Radiology Studies: [facility] Chest [facility] 1 View  Result Date: 07/25/2020 CLINICAL DATA:  Cough EXAM: PORTABLE CHEST 1 VIEW COMPARISON:  None. FINDINGS: Mild cardiac enlargement. No central vascular congestion. Moderate hiatal hernia. Both lungs are clear. Surgical clips project over the left breast. IMPRESSION: 1. Mild cardiac enlargement without focal consolidation or overt pulmonary edema. 2. Moderate hiatal hernia. Electronically Signed   By: Dahlia Bailiff MD   On: 07/25/2020 21:35    Scheduled Meds: . vitamin C  250 mg Oral Daily  . aspirin  81 mg Oral Daily  . benazepril  20 mg  Oral Daily  . docusate sodium  100 mg Oral BID  . enoxaparin (LOVENOX) injection  30 mg Subcutaneous Q24H  . furosemide  20 mg Oral Daily  . latanoprost  1 drop Both Eyes QHS  . polyethylene glycol  17 g Oral Daily   Continuous Infusions:   LOS: 0 days    Time spent: 30 minutes   Barton Dubois, MD [facility]   To contact the attending provider between 7A-7P or the covering provider during after hours 7P-7A, please log into the web site [email or web address] and access using universal [place] password for that web site. If you do not have the password, please call the hospital operator.  07/27/2020, 1:22 PM

## 2020-07-27 NOTE — NC FL2 (Signed)
[place] MEDICAID FL2 LEVEL OF CARE SCREENING TOOL     IDENTIFICATION  Patient Name: Erin [name] Birthdate: October 27, 1930 Sex: female Admission Date (Current Location): 07/25/2020  [facility] and [place] Number:  [employer] and Address:  [place] [address], [place]      Provider Number: [id number]  Attending Physician Name and Address:  Barton Dubois, MD  Relative Name and Phone Number:  [place], [facility])   [phone]    Current Level of Care: Hospital Recommended Level of Care: [place] Prior Approval Number:    Date Approved/Denied: 07/26/20 PASRR Number: [id number]  Discharge Plan: SNF    Current Diagnoses: Patient Active Problem List   Diagnosis Date Noted  . Stage 3a chronic kidney disease ([place]) 07/26/2020  . Cellulitis of foot, right 07/25/2020  . Normocytic anemia   . Chronic venous insufficiency of lower extremity 07/20/2020  . Hearing loss 07/20/2020  . Cellulitis of right leg 07/15/2020  . HTN (hypertension) 07/15/2020  . Breast cancer of upper-inner quadrant of left female breast ([place]) 10/28/2015    Orientation RESPIRATION BLADDER Height & Weight     Self,Time,Situation,Place  Normal Continent Weight: 56.7 kg Height:  5' (152.4 cm)  BEHAVIORAL SYMPTOMS/MOOD NEUROLOGICAL BOWEL NUTRITION STATUS      Continent Diet  AMBULATORY STATUS COMMUNICATION OF NEEDS Skin   Limited Assist Verbally Other (Comment) (Right foot wound)                       Personal Care Assistance Level of Assistance  Bathing,Feeding,Dressing Bathing Assistance: Limited assistance Feeding assistance: Independent Dressing Assistance: Limited assistance     Functional Limitations Info  Sight,Hearing,Speech Sight Info: Adequate Hearing Info: Adequate Speech Info: Adequate    SPECIAL CARE FACTORS FREQUENCY  PT (By licensed PT) (Wound Care)     PT Frequency: 5x              Contractures  Contractures Info: Not present    Additional Factors Info  Code Status Code Status Info: Full             Current Medications (07/27/2020):  This is the current hospital active medication list Current Facility-Administered Medications  Medication Dose Route Frequency Provider Last Rate Last Admin  . acetaminophen (TYLENOL) tablet 650 mg  650 mg Oral Q6H PRN Reubin Milan, MD   650 mg at 07/26/20 2114   Or  . acetaminophen (TYLENOL) suppository 650 mg  650 mg Rectal Q6H PRN Reubin Milan, MD      . ascorbic acid (VITAMIN C) tablet 250 mg  250 mg Oral Daily Barton Dubois, MD   250 mg at 07/27/20 0843  . aspirin chewable tablet 81 mg  81 mg Oral Daily Reubin Milan, MD   81 mg at 07/27/20 0844  . benazepril (LOTENSIN) tablet 20 mg  20 mg Oral Daily Reubin Milan, MD   20 mg at 07/27/20 0843  . docusate sodium (COLACE) capsule 100 mg  100 mg Oral BID Barton Dubois, MD      . enoxaparin (LOVENOX) injection 30 mg  30 mg Subcutaneous Q24H Reubin Milan, MD   30 mg at 07/27/20 0844  . fentaNYL (SUBLIMAZE) injection 50 mcg  50 mcg Intravenous Q2H PRN Barton Dubois, MD   50 mcg at 07/26/20 1507  . furosemide (LASIX) tablet 20 mg  20 mg Oral Daily Reubin Milan, MD   20 mg at 07/27/20 0844  .  latanoprost (XALATAN) 0.005 % ophthalmic solution 1 drop  1 drop Both Eyes QHS Reubin Milan, MD   1 drop at 07/26/20 2117  . ondansetron (ZOFRAN) tablet 4 mg  4 mg Oral Q6H PRN Reubin Milan, MD       Or  . ondansetron [facility]) injection 4 mg  4 mg Intravenous Q8H PRN Reubin Milan, MD      . oxyCODONE (Oxy IR/ROXICODONE) immediate release tablet 2.5 mg  2.5 mg Oral Q6H PRN Reubin Milan, MD   2.5 mg at 07/27/20 0037  . polyethylene glycol (MIRALAX / GLYCOLAX) packet 17 g  17 g Oral Daily Barton Dubois, MD         Discharge Medications: Please see discharge summary for a list of discharge medications.  Relevant Imaging Results:  Relevant  Lab Results:   Additional Information Pt SSN: [id number]  Boneta Lucks, RN

## 2020-07-27 NOTE — TOC Initial Note (Signed)
Transition of Care [facility]) - Initial/Assessment Note    Patient Details  Name: Erin [name] MRN: [id number] Date of Birth: [birth year]  Transition of Care [facility]) CM/SW Contact:    Boneta Lucks, RN Phone Number: 07/27/2020, 1:05 PM  Clinical Narrative:     Patient admitted with Cellulitis of right foot. Patient lives alone and is very weak. PT is recommending SNF. Patient gave permission to call her nephew [name] her POA. He agrees with SNF. Patient has not been vaccinated for COVID. FL2 to be sent out for bed offers and discussed with patient.            Expected Discharge Plan: Skilled Nursing Facility Barriers to Discharge: Continued Medical Work up   Patient Goals and CMS Choice Patient states their goals for this hospitalization and ongoing recovery are:: to go to SNF CMS Medicare.gov Compare Post Acute Care list provided to:: Patient Choice offered to / list presented to : Patient  Expected Discharge Plan and Services Expected Discharge Plan: [place]      Living arrangements for the past 2 months: Single Family Home                    Prior Living Arrangements/Services Living arrangements for the past 2 months: Single Family Home Lives with:: Self Patient language and need for interpreter reviewed:: Yes        Need for Family Participation in Patient Care: Yes (Comment) Care giver support system in place?: Yes (comment) Current home services: DME Criminal Activity/Legal Involvement Pertinent to Current Situation/Hospitalization: No - Comment as needed  Activities of Daily Living Home Assistive Devices/Equipment: Other (Comment) (patient has lift chair) ADL Screening (condition at time of admission) Patient's cognitive ability adequate to safely complete daily activities?: Yes Is the patient deaf or have difficulty hearing?: Yes Does the patient have difficulty seeing, even when wearing glasses/contacts?: No Does the patient have difficulty  concentrating, remembering, or making decisions?: No Patient able to express need for assistance with ADLs?: Yes Does the patient have difficulty dressing or bathing?: Yes Independently performs ADLs?: No Communication: Independent Dressing (OT): Needs assistance Is this a change from baseline?: Pre-admission baseline Grooming: Needs assistance Is this a change from baseline?: Pre-admission baseline Feeding: Independent Bathing: Independent Toileting: Needs assistance Is this a change from baseline?: Pre-admission baseline In/Out Bed: Needs assistance Is this a change from baseline?: Pre-admission baseline Walks in Home: Needs assistance Is this a change from baseline?: Pre-admission baseline Does the patient have difficulty walking or climbing stairs?: Yes Weakness of Legs: Both Weakness of Arms/Hands: Both  Permission Sought/Granted     Emotional Assessment     Affect (typically observed): Accepting,Pleasant Orientation: : Oriented to Self,Oriented to Place,Oriented to  Time,Oriented to Situation Alcohol / Substance Use: Not Applicable Psych Involvement: No (comment)  Admission diagnosis:  Cellulitis of right lower extremity [L03.115] Cellulitis of foot, right [L03.115] Patient Active Problem List   Diagnosis Date Noted  . Stage 3a chronic kidney disease ([place]) 07/26/2020  . Cellulitis of foot, right 07/25/2020  . Normocytic anemia   . Chronic venous insufficiency of lower extremity 07/20/2020  . Hearing loss 07/20/2020  . Cellulitis of right leg 07/15/2020  . HTN (hypertension) 07/15/2020  . Breast cancer of upper-inner quadrant of left female breast ([place]) 10/28/2015   PCP:  Monico Blitz, MD Pharmacy:   [facility], [place] [address] Phone: [phone] Fax: [phone]

## 2020-07-28 ENCOUNTER — Observation Stay (HOSPITAL_COMMUNITY): Payer: Medicare HMO

## 2020-07-28 DIAGNOSIS — N1831 Chronic kidney disease, stage 3a: Secondary | ICD-10-CM | POA: Diagnosis not present

## 2020-07-28 DIAGNOSIS — I10 Essential (primary) hypertension: Secondary | ICD-10-CM | POA: Diagnosis not present

## 2020-07-28 DIAGNOSIS — L03115 Cellulitis of right lower limb: Secondary | ICD-10-CM | POA: Diagnosis not present

## 2020-07-28 MED ORDER — DIPHENHYDRAMINE HCL 50 MG/ML IJ SOLN
25.0000 mg | Freq: Once | INTRAMUSCULAR | Status: AC
  Administered 2020-07-28: 25 mg via INTRAVENOUS
  Filled 2020-07-28: qty 1

## 2020-07-28 NOTE — Progress Notes (Signed)
Patient refuses dressing change due to it being to painful to touch and can only tolerate once a day. Informed patient for it to get better the doctors orders need to be followed.

## 2020-07-28 NOTE — Progress Notes (Signed)
Physical Therapy Treatment Patient Details Name: Erin [name] MRN: [id number] DOB: 04-12-1930 Today's Date: 07/28/2020    History of Present Illness Erin [name] is a 87 y.o. female with medical history significant of hypertension, left breast cancer, history of cystitis, severe impaired hearing, lower extremity venous dialysis with chronic wounds who is brought to the emergency department from home by her brother due to worsening right lower extremity wound.  She was seen in the ED and received dalbavancin on 07/15/2020 and was subsequently discharged home.  She follow-up with Dr. Megan Salon from [facility] on 07/21/2020 who did not recommend further antibiotics.  However, the patient lives by herself and has been struggling to get her compression wraps and stockings at home.  She does not have home health.  Over the past 2 days, she has had progressively worse pain, particularly on the foot, which has severely impaired her mobility as she is unable to bear weight on her RLE.  Her brother was concerned, we went through her home and brought her to the emergency department.  She was found to have a low-grade fever on arrival.  She otherwise denies headache, rhinorrhea, sore throat, wheezing or hemoptysis.  No chest pain, palpitations, diaphoresis, PND orthopnea.  She gets frequent lower extremity edema.  Denies abdominal pain, appetite changes, nausea, emesis, diarrhea, constipation, melena or hematochezia.  No dysuria or hematuria.  No polyuria, polydipsia, polyphagia or blurred vision.    PT Comments     Pt agreeable to LE therex in bed but did not wish to get up due to the pain in her foot with WB and putting it in a dependent position.  Pt able to complete all exercises instructed with AROM, some assist from therapist to increase active range of each exercise.  Pt did not complain of any pain or issues as therapist kept Rt heel/foot in protected position during movement.  Ensured heels were  floating at EOS.           Precautions / Restrictions Precautions Precautions: Fall    Mobility  Bed Mobility Overal bed mobility: Needs Assistance             General bed mobility comments: Pt unable to scoot self up in bed due to UE/LE weakness.  Required total assist to complete         Cognition Arousal/Alertness: Awake/alert Behavior During Therapy: [facility] for tasks assessed/performed Overall Cognitive Status: Within Functional Limits for tasks assessed                                 General Comments: Has to read lips      Exercises General Exercises - Lower Extremity Ankle Circles/Pumps: AROM;Both;10 reps Quad Sets: AROM;Both;10 reps Gluteal Sets: AROM;Both;10 reps Heel Slides: AROM;Both;10 reps Hip ABduction/ADduction: AROM;Both;10 reps Straight Leg Raises: AROM;Both;10 reps     Pertinent Vitals/Pain Pain Assessment: No/denies pain Pain Location: right foot that increases with movement, pressure           PT Goals (current goals can now be found in the care plan section) Progress towards PT goals: Progressing toward goals    Frequency    Min 3X/week       AM-PAC PT "6 Clicks" Mobility   Outcome Measure  Help needed turning from your back to your side while in a flat bed without using bedrails?: A Lot Help needed moving from lying on your back to sitting on the  side of a flat bed without using bedrails?: A Lot Help needed moving to and from a bed to a chair (including a wheelchair)?: A Lot Help needed standing up from a chair using your arms (e.g., wheelchair or bedside chair)?: A Lot Help needed to walk in hospital room?: Total Help needed climbing 3-5 steps with a railing? : Total 6 Click Score: 10    End of Session   Activity Tolerance: Patient tolerated treatment well Patient left: with call bell/phone within reach;in bed;with bed alarm set   PT Visit Diagnosis: Unsteadiness on feet (R26.81);Other abnormalities of gait  and mobility (R26.89);Muscle weakness (generalized) (M62.81)     Time: 7026-3785 PT Time Calculation (min) (ACUTE ONLY): 20 min  Charges:  $Therapeutic Exercise: 8-22 mins                     Teena Irani, PTA/CLT [place], Brayen Bunn B 07/28/2020, 5:23 PM

## 2020-07-28 NOTE — Progress Notes (Signed)
[facility] offered and patient accepted, Confirmed with [name] her POA.  Mardene Celeste started INS AUTH.

## 2020-07-28 NOTE — Progress Notes (Signed)
PROGRESS NOTE  Erin [name] BOF:[id number] DOB: 03/28/1930 DOA: 07/25/2020 PCP: Monico Blitz, MD  Brief History:  87 y.o.femalewith medical history significant ofhypertension, left breast cancer, history of cystitis,severeimpaired hearing, lower extremity venous dialysis with chronic woundswho is brought to the emergency department from home by her brother due toworsening right lower extremitywound. She was seen in the ED and received dalbavancin on 07/15/2020 and was subsequently discharged home. She follow-up with Dr. Megan Salon from [facility] on 07/21/2020 who did not recommend further antibiotics. However, the patient lives by herself and has been struggling to get her compression wraps and stockings at home. She does not have home health. Over the past 2 days, she has had progressively worse pain, particularly on the foot, which has severely impaired her mobility as she is unable to bear weight on her RLE. Her brother was concerned, we went through her home and brought her to the emergency department. She was found to have a low-grade fever on arrival. She otherwise denies headache, rhinorrhea, sore throat, wheezing or hemoptysis. No chest pain, palpitations, diaphoresis, PND orthopnea. She gets frequent lower extremity edema. Denies abdominal pain, appetite changes, nausea, emesis, diarrhea, constipation, melena or hematochezia. No dysuria or hematuria. No polyuria, polydipsia, polyphagia or blurred vision.  ED Course:Initial vital signs were temperature100.2 F, pulse 101, respirations 32, BP 173/96 mmHg and O2 sat 93% on room air. The patient received Ceftin Myophen 650 mg p.o. x1, hydromorphone 0.5 mg IVP, ondansetron 4 mg IVP and cefazolin 1 g IVPB.   Assessment/Plan: Cellulitis of leg/foot, right -following cellulitis protocol patient will be treated with Dalvance 5/16 -No fever and normal WBCs appreciated on examination. -Continue to keep limb elevated as  much as possible -continue PRN analgesics -Appreciate wound care service consultation; will follow recommendations   -Leg has been wrapped according to instructions with already appreciated improvement in swelling and erythematous changes. -xray right foot -start gabapentin  HTN (hypertension) -continue benazepril  Stage 3a chronic kidney disease (HCC) -baseline creatinine 1.1-1.4 -will continue to follow renal function trend -Continue to avoid nephrotoxic agents  hx of glaucoma -continue latanoprost eye drops  anemia of chronic kidney disease  -stable overall -no signs of overt bleeding -baseline Hgb 9-10  physical deconditioning -CM/TOC consulted to assist with placement. -Physical therapy has evaluated patient and is recommending a skilled nursing facility for rehab and conditioning.       Status is: Observation  The patient will require care spanning > 2 midnights and should be moved to inpatient because: IV treatments appropriate due to intensity of illness or inability to take PO  Dispo: The patient is from: Home              Anticipated d/c is to: Home              Patient currently is medically stable to d/c.   Difficult to place patient No        Family Communication:   Left voicemail for nephew  Consultants:  none  Code Status:  FULL   DVT Prophylaxis:  SCDs   Procedures: As Listed in Progress Note Above  Antibiotics: dalbavancin 5/16    Subjective: Patient states right leg swelling and pain are slowly improving.  Denies f/c, cp, sob, n/v/d, abd pain  Objective: Vitals:   07/27/20 0512 07/27/20 2016 07/28/20 0407 07/28/20 1354  BP: (!) 114/49 (!) 151/49 (!) 153/49 (!) 153/57  Pulse: 72 89 76 87  Resp:  18 19 18 18   Temp:  98.2 F (36.8 C) (!) 97.5 F (36.4 C) 98.2 F (36.8 C)  TempSrc:    Oral  SpO2: 98% 95% 95% 99%  Weight:      Height:        Intake/Output Summary (Last 24 hours) at 07/28/2020 1434 Last data filed at  07/28/2020 1300 Gross per 24 hour  Intake 1200 ml  Output 800 ml  Net 400 ml   Weight change:  Exam:   General:  Pt is alert, follows commands appropriately, not in acute distress  HEENT: No icterus, No thrush, No neck mass, [place]/AT  Cardiovascular: RRR, S1/S2, no rubs, no gallops  Respiratory: CTA bilaterally, no wheezing, no crackles, no rhonchi  Abdomen: Soft/+BS, non tender, non distended, no guarding  Extremities: No edema, No lymphangitis, No petechiae, No rashes, no synovitis;  Right leg wrapped   Data Reviewed: I have personally reviewed following labs and imaging studies Basic Metabolic Panel: Recent Labs  Lab 07/25/20 1800 07/26/20 0541  NA 138 140  K 4.8 4.8  CL 105 106  CO2 25 26  GLUCOSE 169* 166*  BUN 36* 38*  CREATININE 1.12* 1.38*  CALCIUM 9.5 8.3*   Liver Function Tests: Recent Labs  Lab 07/26/20 0541  AST 13*  ALT 14  ALKPHOS 47  BILITOT 0.5  PROT 5.9*  ALBUMIN 3.0*   No results for input(s): LIPASE, AMYLASE in the last 168 hours. No results for input(s): AMMONIA in the last 168 hours. Coagulation Profile: No results for input(s): INR, PROTIME in the last 168 hours. CBC: Recent Labs  Lab 07/25/20 1800 07/26/20 0541  WBC 7.6 6.0  NEUTROABS 6.5  --   HGB 9.6* 8.5*  HCT 31.0* 27.9*  MCV 88.3 88.9  PLT 284 252   Cardiac Enzymes: No results for input(s): CKTOTAL, CKMB, CKMBINDEX, TROPONINI in the last 168 hours. BNP: Invalid input(s): POCBNP CBG: No results for input(s): GLUCAP in the last 168 hours. HbA1C: No results for input(s): HGBA1C in the last 72 hours. Urine analysis:    Component Value Date/Time   COLORURINE YELLOW 07/25/2020 [place] 07/25/2020 2203   LABSPEC 1.016 07/25/2020 2203   PHURINE 7.0 07/25/2020 2203   GLUCOSEU NEGATIVE 07/25/2020 2203   HGBUR NEGATIVE 07/25/2020 2203   BILIRUBINUR NEGATIVE 07/25/2020 2203   [facility] 07/25/2020 2203   [place] 07/25/2020 2203    NITRITE NEGATIVE 07/25/2020 2203   LEUKOCYTESUR NEGATIVE 07/25/2020 2203   Sepsis Labs: @LABRCNTIP (procalcitonin:4,lacticidven:4) ) Recent Results (from the past 240 hour(s))  Culture, blood (Routine X 2) w Reflex to ID Panel     Status: None (Preliminary result)   Collection Time: 07/25/20  5:50 PM   Specimen: BLOOD  Result Value Ref Range Status   Specimen Description BLOOD RIGHT ANTECUBITAL  Final   Special Requests   Final    BOTTLES DRAWN AEROBIC AND ANAEROBIC Blood Culture adequate volume   Culture   Final    NO GROWTH 3 DAYS Performed at [facility], [address] [place], [place] [address]    Report Status PENDING  Incomplete  Culture, blood (Routine X 2) w Reflex to ID Panel     Status: None (Preliminary result)   Collection Time: 07/25/20  6:00 PM   Specimen: BLOOD  Result Value Ref Range Status   Specimen Description BLOOD LEFT ANTECUBITAL  Final   Special Requests   Final    BOTTLES DRAWN AEROBIC AND ANAEROBIC Blood Culture adequate volume  Culture   Final    NO GROWTH 3 DAYS Performed at [facility], [address], [place], [place] [address]    Report Status PENDING  Incomplete  Resp Panel by RT-PCR (Flu A&B, Covid) Nasopharyngeal Swab     Status: None   Collection Time: 07/25/20  9:08 PM   Specimen: Nasopharyngeal Swab; Nasopharyngeal(NP) swabs in vial transport medium  Result Value Ref Range Status   SARS Coronavirus 2 by RT PCR NEGATIVE NEGATIVE Final    Comment: (NOTE) SARS-CoV-2 target nucleic acids are NOT DETECTED.  The SARS-CoV-2 RNA is generally detectable in upper respiratory specimens during the acute phase of infection. The lowest concentration of SARS-CoV-2 viral copies this assay can detect is 138 copies/mL. A negative result does not preclude SARS-Cov-2 infection and should not be used as the sole basis for treatment or other patient management decisions. A negative result may occur with  improper specimen collection/handling, submission of  specimen other than nasopharyngeal swab, presence of viral mutation(s) within the areas targeted by this assay, and inadequate number of viral copies(<138 copies/mL). A negative result must be combined with clinical observations, patient history, and epidemiological information. The expected result is Negative.  Fact Sheet for Patients:  EntrepreneurPulse.com.au  Fact Sheet for Healthcare Providers:  IncredibleEmployment.be  This test is no t yet approved or cleared by the [place] FDA and  has been authorized for detection and/or diagnosis of SARS-CoV-2 by FDA under an Emergency Use Authorization (EUA). This EUA will remain  in effect (meaning this test can be used) for the duration of the COVID-19 declaration under Section 564(b)(1) of the Act, 21 U.S.C.section 360bbb-3(b)(1), unless the authorization is terminated  or revoked sooner.       Influenza A by PCR NEGATIVE NEGATIVE Final   Influenza B by PCR NEGATIVE NEGATIVE Final    Comment: (NOTE) The Xpert Xpress SARS-CoV-2/FLU/RSV plus assay is intended as an aid in the diagnosis of influenza from Nasopharyngeal swab specimens and should not be used as a sole basis for treatment. Nasal washings and aspirates are unacceptable for Xpert Xpress SARS-CoV-2/FLU/RSV testing.  Fact Sheet for Patients: EntrepreneurPulse.com.au  Fact Sheet for Healthcare Providers: IncredibleEmployment.be  This test is not yet approved or cleared by the [place] FDA and has been authorized for detection and/or diagnosis of SARS-CoV-2 by FDA under an Emergency Use Authorization (EUA). This EUA will remain in effect (meaning this test can be used) for the duration of the COVID-19 declaration under Section 564(b)(1) of the Act, 21 U.S.C. section 360bbb-3(b)(1), unless the authorization is terminated or revoked.  Performed at [facility], [address]  [place], [place] [address]      Scheduled Meds: . vitamin C  250 mg Oral Daily  . aspirin  81 mg Oral Daily  . benazepril  20 mg Oral Daily  . docusate sodium  100 mg Oral BID  . enoxaparin (LOVENOX) injection  30 mg Subcutaneous Q24H  . furosemide  20 mg Oral Daily  . latanoprost  1 drop Both Eyes QHS  . polyethylene glycol  17 g Oral Daily   Continuous Infusions:  Procedures/Studies: DG Chest Port 1 View  Result Date: 07/25/2020 CLINICAL DATA:  Cough EXAM: PORTABLE CHEST 1 VIEW COMPARISON:  None. FINDINGS: Mild cardiac enlargement. No central vascular congestion. Moderate hiatal hernia. Both lungs are clear. Surgical clips project over the left breast. IMPRESSION: 1. Mild cardiac enlargement without focal consolidation or overt pulmonary edema. 2. Moderate hiatal hernia. Electronically Signed   By:  Dahlia Bailiff MD   On: 07/25/2020 21:35    Orson Eva, DO  [facility]  If 7PM-7AM, please contact night-coverage [email or web address] Password [email or web address] 07/28/2020, 2:34 PM   LOS: 0 days

## 2020-07-28 NOTE — Plan of Care (Signed)

## 2020-07-29 ENCOUNTER — Observation Stay (HOSPITAL_COMMUNITY): Payer: Medicare HMO

## 2020-07-29 DIAGNOSIS — L03115 Cellulitis of right lower limb: Secondary | ICD-10-CM | POA: Diagnosis not present

## 2020-07-29 DIAGNOSIS — I10 Essential (primary) hypertension: Secondary | ICD-10-CM | POA: Diagnosis not present

## 2020-07-29 DIAGNOSIS — N1831 Chronic kidney disease, stage 3a: Secondary | ICD-10-CM | POA: Diagnosis not present

## 2020-07-29 LAB — CBC
HCT: 29.2 % — ABNORMAL LOW (ref 36.0–46.0)
Hemoglobin: 8.8 g/dL — ABNORMAL LOW (ref 12.0–15.0)
MCH: 26.8 pg (ref 26.0–34.0)
MCHC: 30.1 g/dL (ref 30.0–36.0)
MCV: 89 fL (ref 80.0–100.0)
Platelets: 266 10*3/uL (ref 150–400)
RBC: 3.28 MIL/uL — ABNORMAL LOW (ref 3.87–5.11)
RDW: 14.6 % (ref 11.5–15.5)
WBC: 3.1 10*3/uL — ABNORMAL LOW (ref 4.0–10.5)
nRBC: 0 % (ref 0.0–0.2)

## 2020-07-29 LAB — BASIC METABOLIC PANEL
Anion gap: 7 (ref 5–15)
BUN: 37 mg/dL — ABNORMAL HIGH (ref 8–23)
CO2: 26 mmol/L (ref 22–32)
Calcium: 8.5 mg/dL — ABNORMAL LOW (ref 8.9–10.3)
Chloride: 105 mmol/L (ref 98–111)
Creatinine, Ser: 0.9 mg/dL (ref 0.44–1.00)
GFR, Estimated: >60 mL/min (ref >60)
Glucose, Bld: 134 mg/dL — ABNORMAL HIGH (ref 70–99)
Potassium: 4.5 mmol/L (ref 3.5–5.1)
Sodium: 138 mmol/L (ref 135–145)

## 2020-07-29 MED ORDER — ENOXAPARIN SODIUM 40 MG/0.4ML IJ SOSY
40.0000 mg | PREFILLED_SYRINGE | INTRAMUSCULAR | Status: DC
  Administered 2020-07-30 – 2020-08-03 (×5): 40 mg via SUBCUTANEOUS
  Filled 2020-07-29 (×5): qty 0.4

## 2020-07-29 MED ORDER — GABAPENTIN 300 MG PO CAPS
300.0000 mg | ORAL_CAPSULE | Freq: Two times a day (BID) | ORAL | Status: DC
  Administered 2020-07-29 – 2020-08-03 (×10): 300 mg via ORAL
  Filled 2020-07-29 (×10): qty 1

## 2020-07-29 NOTE — Progress Notes (Signed)
PROGRESS NOTE  Erin [name] ZOX:[id number] DOB: 1930/08/19 DOA: 07/25/2020 PCP: Monico Blitz, MD  Brief History:  87 y.o.femalewith medical history significant ofhypertension, left breast cancer, history of cystitis,severeimpaired hearing, lower extremity venous dialysis with chronic woundswho is brought to the emergency department from home by her brother due toworsening right lower extremitywound. She was seen in the ED and received dalbavancin on 07/15/2020 and was subsequently discharged home. She follow-up with Dr. Megan Salon from [place] on 07/21/2020 who did not recommend further antibiotics. However, the patient lives by herself and has been struggling to get her compression wraps and stockings at home. She does not have home health. Over the past 2 days, she has had progressively worse pain, particularly on the foot, which has severely impaired her mobility as she is unable to bear weight on her RLE. Her brother was concerned, we went through her home and brought her to the emergency department. She was found to have a low-grade fever on arrival. She otherwise denies headache, rhinorrhea, sore throat, wheezing or hemoptysis. No chest pain, palpitations, diaphoresis, PND orthopnea. She gets frequent lower extremity edema. Denies abdominal pain, appetite changes, nausea, emesis, diarrhea, constipation, melena or hematochezia. No dysuria or hematuria. No polyuria, polydipsia, polyphagia or blurred vision.  ED Course:Initial vital signs were temperature100.2 F, pulse 101, respirations 32, BP 173/96 mmHg and O2 sat 93% on room air. The patient received Ceftin Myophen 650 mg p.o. x1, hydromorphone 0.5 mg IVP, ondansetron 4 mg IVP and cefazolin 1 g IVPB.   Assessment/Plan: Cellulitis of leg/foot, right -following cellulitis protocol patient will be treated with Dalvance 5/16 -No fever and normal WBCs appreciated on examination. -Continue tokeep limb elevatedas  much as possible -continue PRN analgesics -wound care consult appreciated-->Wash right leg with soap and water. Pat dry. Apply Sween Moisturizing Ointment. Place Xeroform over any intact or ruptured blisters and any weeping areas [name] [phone]). Beginning behind the toes and going to just below the knees, spiral wrap kerlex, then 4 inch ace wrap. Perform daily. -Leg has been wrapped according to instructions-->improving  -see pictures below -xray right foot--lucency distal fifth metatarsal -MR foot -start gabapentin  HTN (hypertension) -continue benazepril  Stage 3a chronic kidney disease (HCC) -baseline creatinine 1.1-1.4 -willcontinue tofollow renal function trend -Continue toavoid nephrotoxic agents  hx of glaucoma -continue latanoprost eye drops  anemia of chronic kidney disease  -stable overall -no signs of overt bleeding -baseline Hgb 9-10  physical deconditioning -CM/TOC consulted to assist with placement. -Physical therapy has evaluated patient and is recommending a skilled nursing facility for rehab and conditioning.       Status is: Observation  The patient will require care spanning > 2 midnights and should be moved to inpatient because: IV treatments appropriate due to intensity of illness or inability to take PO  Dispo: The patient is from: Home  Anticipated d/c is to: Home  Patient currently is medically stable to d/c.              Difficult to place patient No        Family Communication:   brother updated bedside 5/19  Consultants:  none  Code Status:  FULL   DVT Prophylaxis:  SCDs   Procedures: As Listed in Progress Note Above  Antibiotics: dalbavancin 5/16   Subjective: Patient continues to have "burning" on her right leg and foot.  Pain is slightly better.  Denies f/c, cp, sob, n/v/d, abd pain.  Objective: Vitals:  07/28/20 0407 07/28/20 1354 07/28/20 2149 07/29/20 0403  BP:  (!) 153/49 (!) 153/57 (!) 145/48 (!) 145/52  Pulse: 76 87 90 74  Resp: 18 18 20 18   Temp: (!) 97.5 F (36.4 C) 98.2 F (36.8 C) 97.6 F (36.4 C) 97.9 F (36.6 C)  TempSrc:  Oral  Oral  SpO2: 95% 99% 99% 96%  Weight:      Height:        Intake/Output Summary (Last 24 hours) at 07/29/2020 1633 Last data filed at 07/29/2020 0900 Gross per 24 hour  Intake 480 ml  Output 1500 ml  Net -1020 ml   Weight change:  Exam:   General:  Pt is alert, follows commands appropriately, not in acute distress  HEENT: No icterus, No thrush, No neck mass, [place]/AT  Cardiovascular: RRR, S1/S2, no rubs, no gallops  Respiratory: CTA bilaterally, no wheezing, no crackles, no rhonchi  Abdomen: Soft/+BS, non tender, non distended, no guarding Extremities: right leg pictures below            Data Reviewed: I have personally reviewed following labs and imaging studies Basic Metabolic Panel: Recent Labs  Lab 07/25/20 1800 07/26/20 0541 07/29/20 0658  NA 138 140 138  K 4.8 4.8 4.5  CL 105 106 105  CO2 25 26 26   GLUCOSE 169* 166* 134*  BUN 36* 38* 37*  CREATININE 1.12* 1.38* 0.90  CALCIUM 9.5 8.3* 8.5*   Liver Function Tests: Recent Labs  Lab 07/26/20 0541  AST 13*  ALT 14  ALKPHOS 47  BILITOT 0.5  PROT 5.9*  ALBUMIN 3.0*   No results for input(s): LIPASE, AMYLASE in the last 168 hours. No results for input(s): AMMONIA in the last 168 hours. Coagulation Profile: No results for input(s): INR, PROTIME in the last 168 hours. CBC: Recent Labs  Lab 07/25/20 1800 07/26/20 0541 07/29/20 0658  WBC 7.6 6.0 3.1*  NEUTROABS 6.5  --   --   HGB 9.6* 8.5* 8.8*  HCT 31.0* 27.9* 29.2*  MCV 88.3 88.9 89.0  PLT 284 252 266   Cardiac Enzymes: No results for input(s): CKTOTAL, CKMB, CKMBINDEX, TROPONINI in the last 168 hours. BNP: Invalid input(s): POCBNP CBG: No results for input(s): GLUCAP in the last 168 hours. HbA1C: No results for input(s): HGBA1C in the last 72  hours. Urine analysis:    Component Value Date/Time   COLORURINE YELLOW 07/25/2020 [place] 07/25/2020 2203   LABSPEC 1.016 07/25/2020 2203   PHURINE 7.0 07/25/2020 2203   GLUCOSEU NEGATIVE 07/25/2020 2203   HGBUR NEGATIVE 07/25/2020 2203   BILIRUBINUR NEGATIVE 07/25/2020 2203   [place] 07/25/2020 2203   PROTEINUR NEGATIVE 07/25/2020 2203   NITRITE NEGATIVE 07/25/2020 2203   LEUKOCYTESUR NEGATIVE 07/25/2020 2203   Sepsis Labs: @LABRCNTIP (procalcitonin:4,lacticidven:4) ) Recent Results (from the past 240 hour(s))  Culture, blood (Routine X 2) w Reflex to ID Panel     Status: None (Preliminary result)   Collection Time: 07/25/20  5:50 PM   Specimen: BLOOD  Result Value Ref Range Status   Specimen Description BLOOD RIGHT ANTECUBITAL  Final   Special Requests   Final    BOTTLES DRAWN AEROBIC AND ANAEROBIC Blood Culture adequate volume   Culture   Final    NO GROWTH 4 DAYS Performed at [facility], [address], [place], [place] [address]    Report Status PENDING  Incomplete  Culture, blood (Routine X 2) w Reflex to ID Panel     Status: None (Preliminary  result)   Collection Time: 07/25/20  6:00 PM   Specimen: BLOOD  Result Value Ref Range Status   Specimen Description BLOOD LEFT ANTECUBITAL  Final   Special Requests   Final    BOTTLES DRAWN AEROBIC AND ANAEROBIC Blood Culture adequate volume   Culture   Final    NO GROWTH 4 DAYS Performed at [facility], [address], [place], [place] [address]    Report Status PENDING  Incomplete  Resp Panel by RT-PCR (Flu A&B, Covid) Nasopharyngeal Swab     Status: None   Collection Time: 07/25/20  9:08 PM   Specimen: Nasopharyngeal Swab; Nasopharyngeal(NP) swabs in vial transport medium  Result Value Ref Range Status   SARS Coronavirus 2 by RT PCR NEGATIVE NEGATIVE Final    Comment: (NOTE) SARS-CoV-2 target nucleic acids are NOT DETECTED.  The SARS-CoV-2 RNA is generally detectable in upper  respiratory specimens during the acute phase of infection. The lowest concentration of SARS-CoV-2 viral copies this assay can detect is 138 copies/mL. A negative result does not preclude SARS-Cov-2 infection and should not be used as the sole basis for treatment or other patient management decisions. A negative result may occur with  improper specimen collection/handling, submission of specimen other than nasopharyngeal swab, presence of viral mutation(s) within the areas targeted by this assay, and inadequate number of viral copies(<138 copies/mL). A negative result must be combined with clinical observations, patient history, and epidemiological information. The expected result is Negative.  Fact Sheet for Patients:  EntrepreneurPulse.com.au  Fact Sheet for Healthcare Providers:  IncredibleEmployment.be  This test is no t yet approved or cleared by the [place] FDA and  has been authorized for detection and/or diagnosis of SARS-CoV-2 by FDA under an Emergency Use Authorization (EUA). This EUA will remain  in effect (meaning this test can be used) for the duration of the COVID-19 declaration under Section 564(b)(1) of the Act, 21 U.S.C.section 360bbb-3(b)(1), unless the authorization is terminated  or revoked sooner.       Influenza A by PCR NEGATIVE NEGATIVE Final   Influenza B by PCR NEGATIVE NEGATIVE Final    Comment: (NOTE) The Xpert Xpress SARS-CoV-2/FLU/RSV plus assay is intended as an aid in the diagnosis of influenza from Nasopharyngeal swab specimens and should not be used as a sole basis for treatment. Nasal washings and aspirates are unacceptable for Xpert Xpress SARS-CoV-2/FLU/RSV testing.  Fact Sheet for Patients: EntrepreneurPulse.com.au  Fact Sheet for Healthcare Providers: IncredibleEmployment.be  This test is not yet approved or cleared by the [place] FDA and has been  authorized for detection and/or diagnosis of SARS-CoV-2 by FDA under an Emergency Use Authorization (EUA). This EUA will remain in effect (meaning this test can be used) for the duration of the COVID-19 declaration under Section 564(b)(1) of the Act, 21 U.S.C. section 360bbb-3(b)(1), unless the authorization is terminated or revoked.  Performed at [facility], [address], [place], [place] [address]      Scheduled Meds: . vitamin C  250 mg Oral Daily  . aspirin  81 mg Oral Daily  . docusate sodium  100 mg Oral BID  . [START ON 07/30/2020] enoxaparin (LOVENOX) injection  40 mg Subcutaneous Q24H  . furosemide  20 mg Oral Daily  . latanoprost  1 drop Both Eyes QHS  . polyethylene glycol  17 g Oral Daily   Continuous Infusions:  Procedures/Studies: DG Chest Port 1 View  Result Date: 07/25/2020 CLINICAL DATA:  Cough EXAM: PORTABLE CHEST 1 VIEW COMPARISON:  None. FINDINGS: Mild cardiac enlargement. No central vascular congestion. Moderate hiatal hernia. Both lungs are clear. Surgical clips project over the left breast. IMPRESSION: 1. Mild cardiac enlargement without focal consolidation or overt pulmonary edema. 2. Moderate hiatal hernia. Electronically Signed   By: Dahlia Bailiff MD   On: 07/25/2020 21:35   DG Foot 2 Views Right  Result Date: 07/28/2020 CLINICAL DATA:  Wound check.  Right foot pain EXAM: RIGHT FOOT - 2 VIEW COMPARISON:  None. FINDINGS: Negative for fracture or significant arthropathy. Calcaneal spurring Cortical lucency of the distal fifth metatarsal. Question wound in this area. Possible osteomyelitis. IMPRESSION: Lucency distal fifth metatarsal could represent osteomyelitis. Correlate clinically. No fracture. Electronically Signed   By: Franchot Gallo M.D.   On: 07/28/2020 14:43    Orson Eva, DO  [facility]  If 7PM-7AM, please contact night-coverage [email or web address] Password [facility] 07/29/2020, 4:33 PM   LOS: 0 days

## 2020-07-29 NOTE — Progress Notes (Signed)
Patient refused wound care this morning. She said, "that it was too early and she wanted to sleep more."

## 2020-07-29 NOTE — Plan of Care (Signed)

## 2020-07-30 DIAGNOSIS — L03115 Cellulitis of right lower limb: Secondary | ICD-10-CM | POA: Diagnosis not present

## 2020-07-30 DIAGNOSIS — I10 Essential (primary) hypertension: Secondary | ICD-10-CM | POA: Diagnosis not present

## 2020-07-30 DIAGNOSIS — N1831 Chronic kidney disease, stage 3a: Secondary | ICD-10-CM | POA: Diagnosis not present

## 2020-07-30 LAB — CULTURE, BLOOD (ROUTINE X 2)
Culture: NO GROWTH
Culture: NO GROWTH
Special Requests: ADEQUATE
Special Requests: ADEQUATE

## 2020-07-30 MED ORDER — DIPHENHYDRAMINE HCL 25 MG PO CAPS
25.0000 mg | ORAL_CAPSULE | Freq: Once | ORAL | Status: AC
  Administered 2020-07-30: 25 mg via ORAL
  Filled 2020-07-30: qty 1

## 2020-07-30 MED ORDER — GABAPENTIN 300 MG PO CAPS: 300.0000 mg | ORAL_CAPSULE | Freq: Two times a day (BID) | ORAL | Status: DC

## 2020-07-30 MED ORDER — OXYCODONE HCL 5 MG PO TABS: 2.5000 mg | ORAL_TABLET | Freq: Four times a day (QID) | ORAL | 0 refills | Status: DC | PRN

## 2020-07-30 NOTE — Progress Notes (Signed)
Patient stated she was itching all over. Requesting Benadryl. MD Adefeso Notified. Received order to give PO Benadryl.

## 2020-07-30 NOTE — Plan of Care (Signed)

## 2020-07-30 NOTE — Progress Notes (Signed)
Physical Therapy Treatment Patient Details Name: Erin [name] MRN: [id number] DOB: [birth year] Today's Date: 07/30/2020    History of Present Illness Erin [name] is a 87 y.o. female with medical history significant of hypertension, left breast cancer, history of cystitis, severe impaired hearing, lower extremity venous dialysis with chronic wounds who is brought to the emergency department from home by her brother due to worsening right lower extremity wound.  She was seen in the ED and received dalbavancin on 07/15/2020 and was subsequently discharged home.  She follow-up with Dr. Megan Salon from [place] on 07/21/2020 who did not recommend further antibiotics.  However, the patient lives by herself and has been struggling to get her compression wraps and stockings at home.  She does not have home health.  Over the past 2 days, she has had progressively worse pain, particularly on the foot, which has severely impaired her mobility as she is unable to bear weight on her RLE.  Her brother was concerned, we went through her home and brought her to the emergency department.  She was found to have a low-grade fever on arrival.  She otherwise denies headache, rhinorrhea, sore throat, wheezing or hemoptysis.  No chest pain, palpitations, diaphoresis, PND orthopnea.  She gets frequent lower extremity edema.  Denies abdominal pain, appetite changes, nausea, emesis, diarrhea, constipation, melena or hematochezia.  No dysuria or hematuria.  No polyuria, polydipsia, polyphagia or blurred vision.    PT Comments    Patient requires mod/max assist to transition to seated EOB secondary to weakness. She demonstrates impaired sitting balance initially upon sitting which improves after several minutes of support being provided. Patient completes exercises seated EOB with intermittent cueing for mechanics but does show impaired dynamic sitting balance with posterior lean while completing exercises. Attempted to  transfer to standing with max assist and RW 3 times but patient unable to transfer to full standing secondary to LE weakness and impaired balance. Patient assisted back to supine and completes supine exercises. Patient will benefit from continued physical therapy in hospital and recommended venue below to increase strength, balance, endurance for safe ADLs and gait.    Follow Up Recommendations  SNF     Equipment Recommendations  None recommended by PT    Recommendations for Other Services       Precautions / Restrictions Precautions Precautions: Fall Restrictions Weight Bearing Restrictions: No    Mobility  Bed Mobility Overal bed mobility: Needs Assistance Bed Mobility: Supine to Sit     Supine to sit: Mod assist;Max assist     General bed mobility comments: Patient requires cueing for sequencing and assist to pull to seated along with LE movement. Requires assist to return to supine and scoot up in bed    Transfers Overall transfer level: Needs assistance Equipment used: Rolling walker (2 wheeled) Transfers: Sit to/from Stand Sit to Stand: Max assist         General transfer comment: attempt 3x STS, patient unable to transfer to full standing position  Ambulation/Gait                 Stairs             Wheelchair Mobility    Modified Rankin (Stroke Patients Only)       Balance Overall balance assessment: Needs assistance Sitting-balance support: Feet supported;No upper extremity supported Sitting balance-Leahy Scale: Fair Sitting balance - Comments: fair seated at EOB   Standing balance support: During functional activity;Bilateral upper extremity supported Standing balance-Leahy Scale:  Poor Standing balance comment: poor/zero with RW                            Cognition Arousal/Alertness: Awake/alert Behavior During Therapy: WFL for tasks assessed/performed Overall Cognitive Status: Within Functional Limits for tasks  assessed                                 General Comments: Has to read lips      Exercises General Exercises - Lower Extremity Ankle Circles/Pumps: AROM;Both;10 reps;Seated Long Arc Quad: AROM;Both;10 reps;Seated Heel Slides: AROM;Both;10 reps;Supine Straight Leg Raises: AROM;Both;10 reps;Supine Hip Flexion/Marching: AROM;Both;10 reps;Seated    General Comments        Pertinent Vitals/Pain Pain Assessment: No/denies pain    Home Living                      Prior Function            PT Goals (current goals can now be found in the care plan section) Acute Rehab PT Goals Patient Stated Goal: return home after rehab PT Goal Formulation: With patient Time For Goal Achievement: 08/10/20 Potential to Achieve Goals: Good Progress towards PT goals: Progressing toward goals    Frequency    Min 3X/week      PT Plan      Co-evaluation              AM-PAC PT "6 Clicks" Mobility   Outcome Measure  Help needed turning from your back to your side while in a flat bed without using bedrails?: A Lot Help needed moving from lying on your back to sitting on the side of a flat bed without using bedrails?: A Lot Help needed moving to and from a bed to a chair (including a wheelchair)?: A Lot Help needed standing up from a chair using your arms (e.g., wheelchair or bedside chair)?: A Lot Help needed to walk in hospital room?: Total Help needed climbing 3-5 steps with a railing? : Total 6 Click Score: 10    End of Session Equipment Utilized During Treatment: Gait belt Activity Tolerance: Patient tolerated treatment well Patient left: with call bell/phone within reach;in bed;with bed alarm set Nurse Communication: Mobility status PT Visit Diagnosis: Unsteadiness on feet (R26.81);Other abnormalities of gait and mobility (R26.89);Muscle weakness (generalized) (M62.81)     Time: 1194-1740 PT Time Calculation (min) (ACUTE ONLY): 27 min  Charges:   $Therapeutic Exercise: 8-22 mins $Therapeutic Activity: 8-22 mins                     10:20 AM, 07/30/20 Mearl Latin PT, DPT Physical Therapist at [facility]

## 2020-07-30 NOTE — Progress Notes (Signed)
Went in patient room to do wound care. Patient was asleep so woke her up. Patient stated, "why are you waking me up" then fell back asleep.

## 2020-07-30 NOTE — Discharge Summary (Signed)
Physician Discharge Summary  Erin [name] WUJ:[id number] DOB: 05-14-1930 DOA: 07/25/2020  PCP: Monico Blitz, MD  Admit date: 07/25/2020 Discharge date: 07/30/2020  Admitted From: Home Disposition:  SNF  Recommendations for Outpatient Follow-up:  1. Follow up with PCP in 1-2 weeks 2. Please obtain BMP/CBC in one week     Discharge Condition: Stable CODE STATUS: FULL Diet recommendation: Heart Healthy   Brief/Interim Summary: 87 y.o.femalewith medical history significant ofhypertension, left breast cancer, history of cystitis,severeimpaired hearing, lower extremity venous dialysis with chronic woundswho is brought to the emergency department from home by her brother due toworsening right lower extremitywound. She was seen in the ED and received dalbavancin on 07/15/2020 and was subsequently discharged home. She follow-up with Dr. Megan Salon from [place] on 07/21/2020 who did not recommend further antibiotics. However, the patient lives by herself and has been struggling to get her compression wraps and stockings at home. She does not have home health. Over the past 2 days, she has had progressively worse pain, particularly on the foot, which has severely impaired her mobility as she is unable to bear weight on her RLE. Her brother was concerned, we went through her home and brought her to the emergency department. She was found to have a low-grade fever on arrival. She otherwise denies headache, rhinorrhea, sore throat, wheezing or hemoptysis. No chest pain, palpitations, diaphoresis, PND orthopnea. She gets frequent lower extremity edema. Denies abdominal pain, appetite changes, nausea, emesis, diarrhea, constipation, melena or hematochezia. No dysuria or hematuria. No polyuria, polydipsia, polyphagia or blurred vision.  ED Course:Initial vital signs were temperature100.2 F, pulse 101, respirations 32, BP 173/96 mmHg and O2 sat 93% on room air. The patient received  Ceftin Myophen 650 mg p.o. x1, hydromorphone 0.5 mg IVP, ondansetron 4 mg IVP and dalbavancin x 1 given  Discharge Diagnoses:   Cellulitis ofleg/foot, right -following cellulitis protocol patient will be treated with Dalvance5/16 -No fever and normal WBCs appreciated on examination. -Continue tokeep limb elevatedas much as possible -continue PRN analgesics -wound care consult appreciated-->Wash right leg with soap and water. Pat dry. Apply Sween Moisturizing Ointment. Place Xeroform over any intact or ruptured blisters and any weeping areas [name] [phone]). Beginning behind the toes and going to just below the knees, spiral wrap kerlex, then 4 inch ace wrap. Perform daily. -Leg has been wrapped according to instructions-->improving  -see pictures below -xray right foot--lucency distal fifth metatarsal -5/19--MR foot--No discrete drainable soft tissue abscess or findings for septic arthritis or osteomyelitis. -continue gabapentin-->helping with neuropathic pain -erythema and edema--improving with compression dressings-->continue daily at SNF -able to bear weight on left leg/foot at time of d/c  HTN (hypertension) -continuebenazepril  Stage 3achronic kidney disease (HCC) -baseline creatinine 1.1-1.4 -willcontinue tofollow renal function trend -Continue toavoid nephrotoxic agents  hx of glaucoma -continue latanoprost eye drops  anemia of chronic kidney disease  -stable overall -no signs of overt bleeding -baseline Hgb 9-10  physical deconditioning -CM/TOC consulted to assist with placement. -Physical therapy has evaluated patient and is recommending a skilled nursing facility for rehab and conditioning.  Discharge Instructions   Allergies as of 07/30/2020   No Known Allergies     Medication List    STOP taking these medications   benazepril 20 MG tablet Commonly known as: LOTENSIN   ibuprofen 200 MG tablet Commonly known as: ADVIL     TAKE these  medications   aspirin 81 MG tablet Take 81 mg by mouth daily.   CENTRUM SILVER 50+WOMEN PO Take 1 tablet  by mouth daily.   FISH OIL PO Take 600 mg by mouth.   furosemide 20 MG tablet Commonly known as: LASIX Take 20 mg by mouth daily.   gabapentin 300 MG capsule Commonly known as: NEURONTIN Take 1 capsule (300 mg total) by mouth 2 (two) times daily.   Lumigan 0.01 % Soln Generic drug: bimatoprost Place 1 drop into both eyes at bedtime.   oxyCODONE 5 MG immediate release tablet Commonly known as: Oxy IR/ROXICODONE Take 0.5 tablets (2.5 mg total) by mouth every 6 (six) hours as needed for moderate pain or breakthrough pain.   RED YEAST RICE PO Take 2 tablets by mouth.   vitamin C 100 MG tablet Take 100 mg by mouth daily.   vitamin E 45 MG (100 UNITS) capsule Take by mouth daily.       Contact information for after-discharge care    Destination    [facility] .   Service: Skilled Nursing Contact information: [address] [phone]                 No Known Allergies  Consultations:  none   Procedures/Studies: MR FOOT RIGHT WO CONTRAST  Result Date: 07/30/2020 CLINICAL DATA:  Nonhealing foot wound. EXAM: MRI OF THE RIGHT FOREFOOT WITHOUT CONTRAST TECHNIQUE: Multiplanar, multisequence MR imaging of the right foot was performed. No intravenous contrast was administered. COMPARISON:  Radiographs 07/28/2020 FINDINGS: Diffuse subcutaneous soft tissue swelling/edema suggesting cellulitis. No discrete drainable soft tissue abscess is identified. Diffuse fatty atrophy of the foot musculature. No findings for myofasciitis or pyomyositis. No MR findings suspicious for septic arthritis or osteomyelitis. IMPRESSION: 1. Diffuse subcutaneous soft tissue swelling/edema suggesting cellulitis. 2. No discrete drainable soft tissue abscess or findings for septic arthritis or  osteomyelitis. Electronically Signed   By: Marijo Sanes M.D.   On: 07/30/2020 07:59   DG Chest Port 1 View  Result Date: 07/25/2020 CLINICAL DATA:  Cough EXAM: PORTABLE CHEST 1 VIEW COMPARISON:  None. FINDINGS: Mild cardiac enlargement. No central vascular congestion. Moderate hiatal hernia. Both lungs are clear. Surgical clips project over the left breast. IMPRESSION: 1. Mild cardiac enlargement without focal consolidation or overt pulmonary edema. 2. Moderate hiatal hernia. Electronically Signed   By: Dahlia Bailiff MD   On: 07/25/2020 21:35   DG Foot 2 Views Right  Result Date: 07/28/2020 CLINICAL DATA:  Wound check.  Right foot pain EXAM: RIGHT FOOT - 2 VIEW COMPARISON:  None. FINDINGS: Negative for fracture or significant arthropathy. Calcaneal spurring Cortical lucency of the distal fifth metatarsal. Question wound in this area. Possible osteomyelitis. IMPRESSION: Lucency distal fifth metatarsal could represent osteomyelitis. Correlate clinically. No fracture. Electronically Signed   By: Franchot Gallo M.D.   On: 07/28/2020 14:43        Discharge Exam: Vitals:   07/29/20 2046 07/30/20 0446  BP: (!) 154/59 (!) 132/47  Pulse: 73 63  Resp: 20 18  Temp: 97.9 F (36.6 C) 98.2 F (36.8 C)  SpO2: 98% 95%   Vitals:   07/28/20 2149 07/29/20 0403 07/29/20 2046 07/30/20 0446  BP: (!) 145/48 (!) 145/52 (!) 154/59 (!) 132/47  Pulse: 90 74 73 63  Resp: 20 18 20 18   Temp: 97.6 F (36.4 C) 97.9 F (36.6 C) 97.9 F (36.6 C) 98.2 F (36.8 C)  TempSrc:  Oral Oral   SpO2: 99% 96% 98% 95%  Weight:      Height:  General: Pt is alert, awake, not in acute distress Cardiovascular: RRR, S1/S2 +, no rubs, no gallops Respiratory: CTA bilaterally, no wheezing, no rhonchi Abdominal: Soft, NT, ND, bowel sounds + Extremities: trace LLE edema, no cyanosis--no lymphangitis, no necrosis   The results of significant diagnostics from this hospitalization (including imaging, microbiology,  ancillary and laboratory) are listed below for reference.    Significant Diagnostic Studies: MR FOOT RIGHT WO CONTRAST  Result Date: 07/30/2020 CLINICAL DATA:  Nonhealing foot wound. EXAM: MRI OF THE RIGHT FOREFOOT WITHOUT CONTRAST TECHNIQUE: Multiplanar, multisequence MR imaging of the right foot was performed. No intravenous contrast was administered. COMPARISON:  Radiographs 07/28/2020 FINDINGS: Diffuse subcutaneous soft tissue swelling/edema suggesting cellulitis. No discrete drainable soft tissue abscess is identified. Diffuse fatty atrophy of the foot musculature. No findings for myofasciitis or pyomyositis. No MR findings suspicious for septic arthritis or osteomyelitis. IMPRESSION: 1. Diffuse subcutaneous soft tissue swelling/edema suggesting cellulitis. 2. No discrete drainable soft tissue abscess or findings for septic arthritis or osteomyelitis. Electronically Signed   By: Marijo Sanes M.D.   On: 07/30/2020 07:59   DG Chest Port 1 View  Result Date: 07/25/2020 CLINICAL DATA:  Cough EXAM: PORTABLE CHEST 1 VIEW COMPARISON:  None. FINDINGS: Mild cardiac enlargement. No central vascular congestion. Moderate hiatal hernia. Both lungs are clear. Surgical clips project over the left breast. IMPRESSION: 1. Mild cardiac enlargement without focal consolidation or overt pulmonary edema. 2. Moderate hiatal hernia. Electronically Signed   By: Dahlia Bailiff MD   On: 07/25/2020 21:35   DG Foot 2 Views Right  Result Date: 07/28/2020 CLINICAL DATA:  Wound check.  Right foot pain EXAM: RIGHT FOOT - 2 VIEW COMPARISON:  None. FINDINGS: Negative for fracture or significant arthropathy. Calcaneal spurring Cortical lucency of the distal fifth metatarsal. Question wound in this area. Possible osteomyelitis. IMPRESSION: Lucency distal fifth metatarsal could represent osteomyelitis. Correlate clinically. No fracture. Electronically Signed   By: Franchot Gallo M.D.   On: 07/28/2020 14:43     Microbiology: Recent  Results (from the past 240 hour(s))  Culture, blood (Routine X 2) w Reflex to ID Panel     Status: None   Collection Time: 07/25/20  5:50 PM   Specimen: BLOOD  Result Value Ref Range Status   Specimen Description BLOOD RIGHT ANTECUBITAL  Final   Special Requests   Final    BOTTLES DRAWN AEROBIC AND ANAEROBIC Blood Culture adequate volume   Culture   Final    NO GROWTH 5 DAYS Performed at [facility], [address], [place], [place] [address]    Report Status 07/30/2020 FINAL  Final  Culture, blood (Routine X 2) w Reflex to ID Panel     Status: None   Collection Time: 07/25/20  6:00 PM   Specimen: BLOOD  Result Value Ref Range Status   Specimen Description BLOOD LEFT ANTECUBITAL  Final   Special Requests   Final    BOTTLES DRAWN AEROBIC AND ANAEROBIC Blood Culture adequate volume   Culture   Final    NO GROWTH 5 DAYS Performed at [facility], [address], [place], [place] [address]    Report Status 07/30/2020 FINAL  Final  Resp Panel by RT-PCR (Flu A&B, Covid) Nasopharyngeal Swab     Status: None   Collection Time: 07/25/20  9:08 PM   Specimen: Nasopharyngeal Swab; Nasopharyngeal(NP) swabs in vial transport medium  Result Value Ref Range Status   SARS Coronavirus 2 by RT PCR NEGATIVE NEGATIVE Final    Comment: (  NOTE) SARS-CoV-2 target nucleic acids are NOT DETECTED.  The SARS-CoV-2 RNA is generally detectable in upper respiratory specimens during the acute phase of infection. The lowest concentration of SARS-CoV-2 viral copies this assay can detect is 138 copies/mL. A negative result does not preclude SARS-Cov-2 infection and should not be used as the sole basis for treatment or other patient management decisions. A negative result may occur with  improper specimen collection/handling, submission of specimen other than nasopharyngeal swab, presence of viral mutation(s) within the areas targeted by this assay, and inadequate number of viral copies(<138 copies/mL). A  negative result must be combined with clinical observations, patient history, and epidemiological information. The expected result is Negative.  Fact Sheet for Patients:  EntrepreneurPulse.com.au  Fact Sheet for Healthcare Providers:  IncredibleEmployment.be  This test is no t yet approved or cleared by the [place] FDA and  has been authorized for detection and/or diagnosis of SARS-CoV-2 by FDA under an Emergency Use Authorization (EUA). This EUA will remain  in effect (meaning this test can be used) for the duration of the COVID-19 declaration under Section 564(b)(1) of the Act, 21 U.S.C.section 360bbb-3(b)(1), unless the authorization is terminated  or revoked sooner.       Influenza A by PCR NEGATIVE NEGATIVE Final   Influenza B by PCR NEGATIVE NEGATIVE Final    Comment: (NOTE) The Xpert Xpress SARS-CoV-2/FLU/RSV plus assay is intended as an aid in the diagnosis of influenza from Nasopharyngeal swab specimens and should not be used as a sole basis for treatment. Nasal washings and aspirates are unacceptable for Xpert Xpress SARS-CoV-2/FLU/RSV testing.  Fact Sheet for Patients: EntrepreneurPulse.com.au  Fact Sheet for Healthcare Providers: IncredibleEmployment.be  This test is not yet approved or cleared by the [place] FDA and has been authorized for detection and/or diagnosis of SARS-CoV-2 by FDA under an Emergency Use Authorization (EUA). This EUA will remain in effect (meaning this test can be used) for the duration of the COVID-19 declaration under Section 564(b)(1) of the Act, 21 U.S.C. section 360bbb-3(b)(1), unless the authorization is terminated or revoked.  Performed at [facility], [address], [place], [place] [address]      Labs: Basic Metabolic Panel: Recent Labs  Lab 07/25/20 1800 07/26/20 0541 07/29/20 0658  NA 138 140 138  K 4.8 4.8 4.5  CL 105 106 105  CO2  25 26 26   GLUCOSE 169* 166* 134*  BUN 36* 38* 37*  CREATININE 1.12* 1.38* 0.90  CALCIUM 9.5 8.3* 8.5*   Liver Function Tests: Recent Labs  Lab 07/26/20 0541  AST 13*  ALT 14  ALKPHOS 47  BILITOT 0.5  PROT 5.9*  ALBUMIN 3.0*   No results for input(s): LIPASE, AMYLASE in the last 168 hours. No results for input(s): AMMONIA in the last 168 hours. CBC: Recent Labs  Lab 07/25/20 1800 07/26/20 0541 07/29/20 0658  WBC 7.6 6.0 3.1*  NEUTROABS 6.5  --   --   HGB 9.6* 8.5* 8.8*  HCT 31.0* 27.9* 29.2*  MCV 88.3 88.9 89.0  PLT 284 252 266   Cardiac Enzymes: No results for input(s): CKTOTAL, CKMB, CKMBINDEX, TROPONINI in the last 168 hours. BNP: Invalid input(s): POCBNP CBG: No results for input(s): GLUCAP in the last 168 hours.  Time coordinating discharge:  36 minutes  Signed:  Orson Eva, DO [facility] Pager: [phone] 07/30/2020, 12:16 PM

## 2020-07-31 DIAGNOSIS — I10 Essential (primary) hypertension: Secondary | ICD-10-CM | POA: Diagnosis not present

## 2020-07-31 DIAGNOSIS — N1831 Chronic kidney disease, stage 3a: Secondary | ICD-10-CM | POA: Diagnosis not present

## 2020-07-31 DIAGNOSIS — L03115 Cellulitis of right lower limb: Secondary | ICD-10-CM | POA: Diagnosis not present

## 2020-07-31 LAB — GLUCOSE, CAPILLARY: Glucose-Capillary: 214 mg/dL — ABNORMAL HIGH (ref 70–99)

## 2020-07-31 NOTE — Progress Notes (Signed)
PROGRESS NOTE  Erin [name] IOX:[id number] DOB: [birth year] DOA: 07/25/2020 PCP: Monico Blitz, MD  Brief History:  87 y.o.femalewith medical history significant ofhypertension, left breast cancer, history of cystitis,severeimpaired hearing, lower extremity venous dialysis with chronic woundswho is brought to the emergency department from home by her brother due toworsening right lower extremitywound. She was seen in the ED and received dalbavancin on 07/15/2020 and was subsequently discharged home. She follow-up with Dr. Megan Salon from [facility] on 07/21/2020 who did not recommend further antibiotics. However, the patient lives by herself and has been struggling to get her compression wraps and stockings at home. She does not have home health. Over the past 2 days, she has had progressively worse pain, particularly on the foot, which has severely impaired her mobility as she is unable to bear weight on her RLE. Her brother was concerned, we went through her home and brought her to the emergency department. She was found to have a low-grade fever on arrival. She otherwise denies headache, rhinorrhea, sore throat, wheezing or hemoptysis. No chest pain, palpitations, diaphoresis, PND orthopnea. She gets frequent lower extremity edema. Denies abdominal pain, appetite changes, nausea, emesis, diarrhea, constipation, melena or hematochezia. No dysuria or hematuria. No polyuria, polydipsia, polyphagia or blurred vision.  ED Course:Initial vital signs were temperature100.2 F, pulse 101, respirations 32, BP 173/96 mmHg and O2 sat 93% on room air. The patient received Ceftin Myophen 650 mg p.o. x1, hydromorphone 0.5 mg IVP, ondansetron 4 mg IVP and dalbavancin x 1 given  Assessment/Plan: Cellulitis ofleg/foot, right -following cellulitis protocol patient will be treated with Dalvance5/16 -No fever and normal WBCs appreciated on examination. -Continue tokeep limb elevatedas  much as possible -continue PRN analgesics -wound care consult appreciated-->Wash right leg with soap and water. Pat dry. Apply Sween Moisturizing Ointment. Place Xeroform over any intact or ruptured blisters and any weeping areas [name] [phone]). Beginning behind the toes and going to just below the knees, spiral wrap kerlex, then 4 inch ace wrap. Perform daily. -Leg has been wrapped according to instructions-->improving  -see pictures below -xray right foot--lucency distal fifth metatarsal -5/19--MR foot--No discrete drainable soft tissue abscess or findings for septic arthritis or osteomyelitis. -continue gabapentin-->helping with neuropathic pain -erythema and edema--improving with compression dressings-->continue daily at SNF -able to bear weight on left leg/foot   HTN (hypertension) -continuebenazepril  Stage 3achronic kidney disease (HCC) -baseline creatinine 1.1-1.4 -willcontinue tofollow renal function trend -Continue toavoid nephrotoxic agents  hx of glaucoma -continue latanoprost eye drops  anemia of chronic kidney disease  -stable overall -no signs of overt bleeding -baseline Hgb 9-10  physical deconditioning -CM/TOC consulted to assist with placement. -Physical therapy has evaluated patient and is recommending a skilled nursing facility for rehab and conditioning.    Status is: Observation    Dispo: The patient is from: Home              Anticipated d/c is to: SNF               Patient currently is medically stable   Difficult to place patient No        Family Communication:   No Family at bedside  Consultants:  none  Code Status:  FULL   DVT Prophylaxis:  Mason Lovenox   Procedures: As Listed in Progress Note Above  Antibiotics: dalbavancin5/16       Subjective: Patient states leg pain and burning are improving but still has some problem standing.  Patient denies fevers, chills, headache, chest pain, dyspnea, nausea, vomiting,  diarrhea, abdominal pain, dysuria,  Objective: Vitals:   07/30/20 1350 07/30/20 2100 07/31/20 0519 07/31/20 1254  BP: (!) 110/42 114/60 128/62 (!) 137/57  Pulse: 70 72 70 79  Resp: 20 20 18 18   Temp: 97.9 F (36.6 C) 98.1 F (36.7 C) 98.5 F (36.9 C) 98.4 F (36.9 C)  TempSrc: Oral   Oral  SpO2: 97% 97% 97% 96%  Weight:      Height:        Intake/Output Summary (Last 24 hours) at 07/31/2020 1653 Last data filed at 07/30/2020 1700 Gross per 24 hour  Intake 240 ml  Output --  Net 240 ml   Weight change:  Exam:   General:  Pt is alert, follows commands appropriately, not in acute distress  HEENT: No icterus, No thrush, No neck mass, [place]/AT  Cardiovascular: RRR, S1/S2, no rubs, no gallops  Respiratory: CTA bilaterally, no wheezing, no crackles, no rhonchi  Abdomen: Soft/+BS, non tender, non distended, no guarding  Extremities: right leg with mild erythema.  No pus.  No necrosis   Data Reviewed: I have personally reviewed following labs and imaging studies Basic Metabolic Panel: Recent Labs  Lab 07/25/20 1800 07/26/20 0541 07/29/20 0658  NA 138 140 138  K 4.8 4.8 4.5  CL 105 106 105  CO2 25 26 26   GLUCOSE 169* 166* 134*  BUN 36* 38* 37*  CREATININE 1.12* 1.38* 0.90  CALCIUM 9.5 8.3* 8.5*   Liver Function Tests: Recent Labs  Lab 07/26/20 0541  AST 13*  ALT 14  ALKPHOS 47  BILITOT 0.5  PROT 5.9*  ALBUMIN 3.0*   No results for input(s): LIPASE, AMYLASE in the last 168 hours. No results for input(s): AMMONIA in the last 168 hours. Coagulation Profile: No results for input(s): INR, PROTIME in the last 168 hours. CBC: Recent Labs  Lab 07/25/20 1800 07/26/20 0541 07/29/20 0658  WBC 7.6 6.0 3.1*  NEUTROABS 6.5  --   --   HGB 9.6* 8.5* 8.8*  HCT 31.0* 27.9* 29.2*  MCV 88.3 88.9 89.0  PLT 284 252 266   Cardiac Enzymes: No results for input(s): CKTOTAL, CKMB, CKMBINDEX, TROPONINI in the last 168 hours. BNP: Invalid input(s): POCBNP CBG: Recent  Labs  Lab 07/31/20 1107  GLUCAP 214*   HbA1C: No results for input(s): HGBA1C in the last 72 hours. Urine analysis:    Component Value Date/Time   COLORURINE YELLOW 07/25/2020 [place] 07/25/2020 2203   LABSPEC 1.016 07/25/2020 2203   PHURINE 7.0 07/25/2020 2203   GLUCOSEU NEGATIVE 07/25/2020 2203   HGBUR NEGATIVE 07/25/2020 2203   BILIRUBINUR NEGATIVE 07/25/2020 2203   [place] 07/25/2020 2203   PROTEINUR NEGATIVE 07/25/2020 2203   NITRITE NEGATIVE 07/25/2020 2203   LEUKOCYTESUR NEGATIVE 07/25/2020 2203   Sepsis Labs: @LABRCNTIP (procalcitonin:4,lacticidven:4) ) Recent Results (from the past 240 hour(s))  Culture, blood (Routine X 2) w Reflex to ID Panel     Status: None   Collection Time: 07/25/20  5:50 PM   Specimen: BLOOD  Result Value Ref Range Status   Specimen Description BLOOD RIGHT ANTECUBITAL  Final   Special Requests   Final    BOTTLES DRAWN AEROBIC AND ANAEROBIC Blood Culture adequate volume   Culture   Final    NO GROWTH 5 DAYS Performed at [facility], [address], [place], [place] [address]    Report Status 07/30/2020 FINAL  Final  Culture, blood (Routine  X 2) w Reflex to ID Panel     Status: None   Collection Time: 07/25/20  6:00 PM   Specimen: BLOOD  Result Value Ref Range Status   Specimen Description BLOOD LEFT ANTECUBITAL  Final   Special Requests   Final    BOTTLES DRAWN AEROBIC AND ANAEROBIC Blood Culture adequate volume   Culture   Final    NO GROWTH 5 DAYS Performed at [facility], [address], [place], [place] [address]    Report Status 07/30/2020 FINAL  Final  Resp Panel by RT-PCR (Flu A&B, Covid) Nasopharyngeal Swab     Status: None   Collection Time: 07/25/20  9:08 PM   Specimen: Nasopharyngeal Swab; Nasopharyngeal(NP) swabs in vial transport medium  Result Value Ref Range Status   SARS Coronavirus 2 by RT PCR NEGATIVE NEGATIVE Final    Comment: (NOTE) SARS-CoV-2 target nucleic acids are NOT  DETECTED.  The SARS-CoV-2 RNA is generally detectable in upper respiratory specimens during the acute phase of infection. The lowest concentration of SARS-CoV-2 viral copies this assay can detect is 138 copies/mL. A negative result does not preclude SARS-Cov-2 infection and should not be used as the sole basis for treatment or other patient management decisions. A negative result may occur with  improper specimen collection/handling, submission of specimen other than nasopharyngeal swab, presence of viral mutation(s) within the areas targeted by this assay, and inadequate number of viral copies(<138 copies/mL). A negative result must be combined with clinical observations, patient history, and epidemiological information. The expected result is Negative.  Fact Sheet for Patients:  EntrepreneurPulse.com.au  Fact Sheet for Healthcare Providers:  IncredibleEmployment.be  This test is no t yet approved or cleared by the [place] FDA and  has been authorized for detection and/or diagnosis of SARS-CoV-2 by FDA under an Emergency Use Authorization (EUA). This EUA will remain  in effect (meaning this test can be used) for the duration of the COVID-19 declaration under Section 564(b)(1) of the Act, 21 U.S.C.section 360bbb-3(b)(1), unless the authorization is terminated  or revoked sooner.       Influenza A by PCR NEGATIVE NEGATIVE Final   Influenza B by PCR NEGATIVE NEGATIVE Final    Comment: (NOTE) The Xpert Xpress SARS-CoV-2/FLU/RSV plus assay is intended as an aid in the diagnosis of influenza from Nasopharyngeal swab specimens and should not be used as a sole basis for treatment. Nasal washings and aspirates are unacceptable for Xpert Xpress SARS-CoV-2/FLU/RSV testing.  Fact Sheet for Patients: EntrepreneurPulse.com.au  Fact Sheet for Healthcare Providers: IncredibleEmployment.be  This test is not yet  approved or cleared by the [place] FDA and has been authorized for detection and/or diagnosis of SARS-CoV-2 by FDA under an Emergency Use Authorization (EUA). This EUA will remain in effect (meaning this test can be used) for the duration of the COVID-19 declaration under Section 564(b)(1) of the Act, 21 U.S.C. section 360bbb-3(b)(1), unless the authorization is terminated or revoked.  Performed at [facility], [address] [place], [place] [address]      Scheduled Meds: . vitamin C  250 mg Oral Daily  . aspirin  81 mg Oral Daily  . docusate sodium  100 mg Oral BID  . enoxaparin (LOVENOX) injection  40 mg Subcutaneous Q24H  . furosemide  20 mg Oral Daily  . gabapentin  300 mg Oral BID  . latanoprost  1 drop Both Eyes QHS  . polyethylene glycol  17 g Oral Daily   Continuous Infusions:  Procedures/Studies: MR FOOT  RIGHT WO CONTRAST  Result Date: 07/30/2020 CLINICAL DATA:  Nonhealing foot wound. EXAM: MRI OF THE RIGHT FOREFOOT WITHOUT CONTRAST TECHNIQUE: Multiplanar, multisequence MR imaging of the right foot was performed. No intravenous contrast was administered. COMPARISON:  Radiographs 07/28/2020 FINDINGS: Diffuse subcutaneous soft tissue swelling/edema suggesting cellulitis. No discrete drainable soft tissue abscess is identified. Diffuse fatty atrophy of the foot musculature. No findings for myofasciitis or pyomyositis. No MR findings suspicious for septic arthritis or osteomyelitis. IMPRESSION: 1. Diffuse subcutaneous soft tissue swelling/edema suggesting cellulitis. 2. No discrete drainable soft tissue abscess or findings for septic arthritis or osteomyelitis. Electronically Signed   By: Marijo Sanes M.D.   On: 07/30/2020 07:59   DG Chest Port 1 View  Result Date: 07/25/2020 CLINICAL DATA:  Cough EXAM: PORTABLE CHEST 1 VIEW COMPARISON:  None. FINDINGS: Mild cardiac enlargement. No central vascular congestion. Moderate hiatal hernia. Both lungs are clear. Surgical clips  project over the left breast. IMPRESSION: 1. Mild cardiac enlargement without focal consolidation or overt pulmonary edema. 2. Moderate hiatal hernia. Electronically Signed   By: Dahlia Bailiff MD   On: 07/25/2020 21:35   DG Foot 2 Views Right  Result Date: 07/28/2020 CLINICAL DATA:  Wound check.  Right foot pain EXAM: RIGHT FOOT - 2 VIEW COMPARISON:  None. FINDINGS: Negative for fracture or significant arthropathy. Calcaneal spurring Cortical lucency of the distal fifth metatarsal. Question wound in this area. Possible osteomyelitis. IMPRESSION: Lucency distal fifth metatarsal could represent osteomyelitis. Correlate clinically. No fracture. Electronically Signed   By: Franchot Gallo M.D.   On: 07/28/2020 14:43    Orson Eva, DO  [facility]  If 7PM-7AM, please contact night-coverage [email or web address] Password [email or web address] 07/31/2020, 4:53 PM   LOS: 0 days

## 2020-08-01 DIAGNOSIS — N1831 Chronic kidney disease, stage 3a: Secondary | ICD-10-CM | POA: Diagnosis not present

## 2020-08-01 DIAGNOSIS — I10 Essential (primary) hypertension: Secondary | ICD-10-CM | POA: Diagnosis not present

## 2020-08-01 DIAGNOSIS — L03115 Cellulitis of right lower limb: Secondary | ICD-10-CM | POA: Diagnosis not present

## 2020-08-01 LAB — CREATININE, SERUM
Creatinine, Ser: 1.01 mg/dL — ABNORMAL HIGH (ref 0.44–1.00)
GFR, Estimated: 54 mL/min — ABNORMAL LOW (ref >60)

## 2020-08-01 MED ORDER — BISACODYL 10 MG RE SUPP
10.0000 mg | Freq: Once | RECTAL | Status: AC
  Administered 2020-08-01: 10 mg via RECTAL
  Filled 2020-08-01: qty 1

## 2020-08-01 NOTE — Progress Notes (Signed)
PROGRESS NOTE  Erin [name] TKZ:[id number] DOB: Oct 21, 1930 DOA: 07/25/2020 PCP: Monico Blitz, MD  Brief History:  87 y.o.femalewith medical history significant ofhypertension, left breast cancer, history of cystitis,severeimpaired hearing, lower extremity venous dialysis with chronic woundswho is brought to the emergency department from home by her brother due toworsening right lower extremitywound. She was seen in the ED and received dalbavancin on 07/15/2020 and was subsequently discharged home. She follow-up with Dr. Megan Salon from [place] on 07/21/2020 who did not recommend further antibiotics. However, the patient lives by herself and has been struggling to get her compression wraps and stockings at home. She does not have home health. Over the past 2 days, she has had progressively worse pain, particularly on the foot, which has severely impaired her mobility as she is unable to bear weight on her RLE. Her brother was concerned, we went through her home and brought her to the emergency department. She was found to have a low-grade fever on arrival. She otherwise denies headache, rhinorrhea, sore throat, wheezing or hemoptysis. No chest pain, palpitations, diaphoresis, PND orthopnea. She gets frequent lower extremity edema. Denies abdominal pain, appetite changes, nausea, emesis, diarrhea, constipation, melena or hematochezia. No dysuria or hematuria. No polyuria, polydipsia, polyphagia or blurred vision.  ED Course:Initial vital signs were temperature100.2 F, pulse 101, respirations 32, BP 173/96 mmHg and O2 sat 93% on room air. The patient received Ceftin Myophen 650 mg p.o. x1, hydromorphone 0.5 mg IVP, ondansetron 4 mg IVP anddalbavancin x 1 given  Assessment/Plan: Cellulitis ofleg/foot, right -following cellulitis protocol patient will be treated with Dalvance5/16 -No fever and normal WBCs appreciated on examination. -Continue tokeep limb elevatedas  much as possible -continue PRN analgesics -wound care consult appreciated-->Wash right leg with soap and water. Pat dry. Apply Sween Moisturizing Ointment. Place Xeroform over any intact or ruptured blisters and any weeping areas [name] [phone]). Beginning behind the toes and going to just below the knees, spiral wrap kerlex, then 4 inch ace wrap. Perform daily. -Leg has been wrapped according to instructions-->improving  -see pictures below -xray right foot--lucency distal fifth metatarsal -5/19--MR foot--No discrete drainable soft tissue abscess or findings for septic arthritis or osteomyelitis. -continuegabapentin-->helping with neuropathic pain -erythema and edema--improving with compression dressings-->continue daily at SNF -able to bear weight on left leg/foot   HTN (hypertension) -continuebenazepril  Stage 3achronic kidney disease (HCC) -baseline creatinine 1.1-1.4 -willcontinue tofollow renal function trend -Continue toavoid nephrotoxic agents  hx of glaucoma -continue latanoprost eye drops  anemia of chronic kidney disease  -stable overall -no signs of overt bleeding -baseline Hgb 9-10  physical deconditioning -CM/TOC consulted to assist with placement. -Physical therapy has evaluated patient and is recommending a skilled nursing facility for rehab and conditioning.    Status is: Observation    Dispo: The patient is from: Home  Anticipated d/c is to: SNF   Patient currently is medically stable              Difficult to place patient No        Family Communication:   No Family at bedside  Consultants:  none  Code Status:  FULL   DVT Prophylaxis:  Shorter Lovenox   Procedures: As Listed in Progress Note Above  Antibiotics: dalbavancin5/16   Subjective:  Patient denies fevers, chills, headache, chest pain, dyspnea, nausea, vomiting, diarrhea, abdominal pain, dysuria, hematuria,    Objective: Vitals:   07/31/20 1254 07/31/20 2128 08/01/20 0536 08/01/20 1317  BP: Marland Kitchen)  137/57 (!) 147/65 (!) 145/59 (!) 143/53  Pulse: 79 79 77 95  Resp: 18 17 18 20   Temp: 98.4 F (36.9 C) 98.3 F (36.8 C) 98 F (36.7 C) 99.6 F (37.6 C)  TempSrc: Oral Oral Oral Oral  SpO2: 96% 99% 100% 95%  Weight:      Height:        Intake/Output Summary (Last 24 hours) at 08/01/2020 1751 Last data filed at 08/01/2020 1300 Gross per 24 hour  Intake 480 ml  Output 700 ml  Net -220 ml   Weight change:  Exam:   General:  Pt is alert, follows commands appropriately, not in acute distress  HEENT: No icterus, No thrush, No neck mass, [place]/AT  Cardiovascular: RRR, S1/S2, no rubs, no gallops  Respiratory: CTA bilaterally, no wheezing, no crackles, no rhonchi  Abdomen: Soft/+BS, non tender, non distended, no guarding  Extremities: No edema, No lymphangitis, No petechiae, No rashes, no synovitis   Data Reviewed: I have personally reviewed following labs and imaging studies Basic Metabolic Panel: Recent Labs  Lab 07/25/20 1800 07/26/20 0541 07/29/20 0658 08/01/20 0549  NA 138 140 138  --   K 4.8 4.8 4.5  --   CL 105 106 105  --   CO2 25 26 26   --   GLUCOSE 169* 166* 134*  --   BUN 36* 38* 37*  --   CREATININE 1.12* 1.38* 0.90 1.01*  CALCIUM 9.5 8.3* 8.5*  --    Liver Function Tests: Recent Labs  Lab 07/26/20 0541  AST 13*  ALT 14  ALKPHOS 47  BILITOT 0.5  PROT 5.9*  ALBUMIN 3.0*   No results for input(s): LIPASE, AMYLASE in the last 168 hours. No results for input(s): AMMONIA in the last 168 hours. Coagulation Profile: No results for input(s): INR, PROTIME in the last 168 hours. CBC: Recent Labs  Lab 07/25/20 1800 07/26/20 0541 07/29/20 0658  WBC 7.6 6.0 3.1*  NEUTROABS 6.5  --   --   HGB 9.6* 8.5* 8.8*  HCT 31.0* 27.9* 29.2*  MCV 88.3 88.9 89.0  PLT 284 252 266   Cardiac Enzymes: No results for input(s): CKTOTAL, CKMB, CKMBINDEX, TROPONINI in the last  168 hours. BNP: Invalid input(s): POCBNP CBG: Recent Labs  Lab 07/31/20 1107  GLUCAP 214*   HbA1C: No results for input(s): HGBA1C in the last 72 hours. Urine analysis:    Component Value Date/Time   COLORURINE YELLOW 07/25/2020 2203   APPEARANCEUR CLEAR 07/25/2020 2203   LABSPEC 1.016 07/25/2020 2203   PHURINE 7.0 07/25/2020 2203   GLUCOSEU NEGATIVE 07/25/2020 2203   HGBUR NEGATIVE 07/25/2020 2203   BILIRUBINUR NEGATIVE 07/25/2020 2203   [place] 07/25/2020 2203   [place] 07/25/2020 2203   NITRITE NEGATIVE 07/25/2020 2203   LEUKOCYTESUR NEGATIVE 07/25/2020 2203   Sepsis Labs: @LABRCNTIP (procalcitonin:4,lacticidven:4) ) Recent Results (from the past 240 hour(s))  Culture, blood (Routine X 2) w Reflex to ID Panel     Status: None   Collection Time: 07/25/20  5:50 PM   Specimen: BLOOD  Result Value Ref Range Status   Specimen Description BLOOD RIGHT ANTECUBITAL  Final   Special Requests   Final    BOTTLES DRAWN AEROBIC AND ANAEROBIC Blood Culture adequate volume   Culture   Final    NO GROWTH 5 DAYS Performed at [facility], [address] [place],  [address]    Report Status 07/30/2020 FINAL  Final  Culture, blood (Routine X 2) w Reflex to  ID Panel     Status: None   Collection Time: 07/25/20  6:00 PM   Specimen: BLOOD  Result Value Ref Range Status   Specimen Description BLOOD LEFT ANTECUBITAL  Final   Special Requests   Final    BOTTLES DRAWN AEROBIC AND ANAEROBIC Blood Culture adequate volume   Culture   Final    NO GROWTH 5 DAYS Performed at [facility], [address] [place], [place] [address]    Report Status 07/30/2020 FINAL  Final  Resp Panel by RT-PCR (Flu A&B, Covid) Nasopharyngeal Swab     Status: None   Collection Time: 07/25/20  9:08 PM   Specimen: Nasopharyngeal Swab; Nasopharyngeal(NP) swabs in vial transport medium  Result Value Ref Range Status   SARS Coronavirus 2 by RT PCR NEGATIVE NEGATIVE Final    Comment:  (NOTE) SARS-CoV-2 target nucleic acids are NOT DETECTED.  The SARS-CoV-2 RNA is generally detectable in upper respiratory specimens during the acute phase of infection. The lowest concentration of SARS-CoV-2 viral copies this assay can detect is 138 copies/mL. A negative result does not preclude SARS-Cov-2 infection and should not be used as the sole basis for treatment or other patient management decisions. A negative result may occur with  improper specimen collection/handling, submission of specimen other than nasopharyngeal swab, presence of viral mutation(s) within the areas targeted by this assay, and inadequate number of viral copies(<138 copies/mL). A negative result must be combined with clinical observations, patient history, and epidemiological information. The expected result is Negative.  Fact Sheet for Patients:  EntrepreneurPulse.com.au  Fact Sheet for Healthcare Providers:  IncredibleEmployment.be  This test is no t yet approved or cleared by the [place] FDA and  has been authorized for detection and/or diagnosis of SARS-CoV-2 by FDA under an Emergency Use Authorization (EUA). This EUA will remain  in effect (meaning this test can be used) for the duration of the COVID-19 declaration under Section 564(b)(1) of the Act, 21 U.S.C.section 360bbb-3(b)(1), unless the authorization is terminated  or revoked sooner.       Influenza A by PCR NEGATIVE NEGATIVE Final   Influenza B by PCR NEGATIVE NEGATIVE Final    Comment: (NOTE) The Xpert Xpress SARS-CoV-2/FLU/RSV plus assay is intended as an aid in the diagnosis of influenza from Nasopharyngeal swab specimens and should not be used as a sole basis for treatment. Nasal washings and aspirates are unacceptable for Xpert Xpress SARS-CoV-2/FLU/RSV testing.  Fact Sheet for Patients: EntrepreneurPulse.com.au  Fact Sheet for Healthcare  Providers: IncredibleEmployment.be  This test is not yet approved or cleared by the [place] FDA and has been authorized for detection and/or diagnosis of SARS-CoV-2 by FDA under an Emergency Use Authorization (EUA). This EUA will remain in effect (meaning this test can be used) for the duration of the COVID-19 declaration under Section 564(b)(1) of the Act, 21 U.S.C. section 360bbb-3(b)(1), unless the authorization is terminated or revoked.  Performed at [facility], [address], [place], [place] [address]      Scheduled Meds: . vitamin C  250 mg Oral Daily  . aspirin  81 mg Oral Daily  . bisacodyl  10 mg Rectal Once  . docusate sodium  100 mg Oral BID  . enoxaparin (LOVENOX) injection  40 mg Subcutaneous Q24H  . furosemide  20 mg Oral Daily  . gabapentin  300 mg Oral BID  . latanoprost  1 drop Both Eyes QHS  . polyethylene glycol  17 g Oral Daily   Continuous Infusions:  Procedures/Studies: MR FOOT RIGHT WO CONTRAST  Result Date: 07/30/2020 CLINICAL DATA:  Nonhealing foot wound. EXAM: MRI OF THE RIGHT FOREFOOT WITHOUT CONTRAST TECHNIQUE: Multiplanar, multisequence MR imaging of the right foot was performed. No intravenous contrast was administered. COMPARISON:  Radiographs 07/28/2020 FINDINGS: Diffuse subcutaneous soft tissue swelling/edema suggesting cellulitis. No discrete drainable soft tissue abscess is identified. Diffuse fatty atrophy of the foot musculature. No findings for myofasciitis or pyomyositis. No MR findings suspicious for septic arthritis or osteomyelitis. IMPRESSION: 1. Diffuse subcutaneous soft tissue swelling/edema suggesting cellulitis. 2. No discrete drainable soft tissue abscess or findings for septic arthritis or osteomyelitis. Electronically Signed   By: Marijo Sanes M.D.   On: 07/30/2020 07:59   DG Chest Port 1 View  Result Date: 07/25/2020 CLINICAL DATA:  Cough EXAM: PORTABLE CHEST 1 VIEW COMPARISON:  None. FINDINGS: Mild  cardiac enlargement. No central vascular congestion. Moderate hiatal hernia. Both lungs are clear. Surgical clips project over the left breast. IMPRESSION: 1. Mild cardiac enlargement without focal consolidation or overt pulmonary edema. 2. Moderate hiatal hernia. Electronically Signed   By: Dahlia Bailiff MD   On: 07/25/2020 21:35   DG Foot 2 Views Right  Result Date: 07/28/2020 CLINICAL DATA:  Wound check.  Right foot pain EXAM: RIGHT FOOT - 2 VIEW COMPARISON:  None. FINDINGS: Negative for fracture or significant arthropathy. Calcaneal spurring Cortical lucency of the distal fifth metatarsal. Question wound in this area. Possible osteomyelitis. IMPRESSION: Lucency distal fifth metatarsal could represent osteomyelitis. Correlate clinically. No fracture. Electronically Signed   By: Franchot Gallo M.D.   On: 07/28/2020 14:43    Orson Eva, DO  [facility]  If 7PM-7AM, please contact night-coverage [email or web address] Password [facility] 08/01/2020, 5:51 PM   LOS: 0 days

## 2020-08-02 DIAGNOSIS — N1831 Chronic kidney disease, stage 3a: Secondary | ICD-10-CM | POA: Diagnosis not present

## 2020-08-02 DIAGNOSIS — L03115 Cellulitis of right lower limb: Secondary | ICD-10-CM | POA: Diagnosis not present

## 2020-08-02 NOTE — TOC Transition Note (Addendum)
Transition of Care [facility]) - CM/SW Discharge Note   Patient Details  Name: Erin [name] MRN: [id number] Date of Birth: 02-22-1931  [facility] CM/SW Contact:  Boneta Lucks, RN Phone Number: 08/02/2020, 3:59 PM   Clinical Narrative:   MD completed peer to peer with [employer]. Patient was approved. Sent DC summary to [facility], updated [name] her nephew. RN to call report after negative COVID test. Medical necessity filled out.   DC today, Covid test came in 5 am this morning. Updated [name], gave RN number for report.   Final next level of care: Skilled Nursing Facility Barriers to Discharge: Barriers Resolved  Patient Goals and CMS Choice Patient states their goals for this hospitalization and ongoing recovery are:: to go to SNF CMS Medicare.gov Compare Post Acute Care list provided to:: Patient Choice offered to / list presented to : Patient  Discharge Placement              Patient [facility] at:  [facility]) Patient to be transferred to facility by: EMS Name of family member notified: [name] Patient and family notified of of transfer: 08/02/20  Discharge Plan and Services

## 2020-08-02 NOTE — Discharge Summary (Signed)
Physician Discharge Summary  Erin [name] SWN:[id number] DOB: 06-26-1930 DOA: 07/25/2020  PCP: Monico Blitz, MD  Admit date: 07/25/2020 Discharge date: 08/02/2020  Admitted From: Home Disposition:  SNF  Recommendations for Outpatient Follow-up:  1. Follow up with PCP in 1-2 weeks 2. Please obtain BMP/CBC in one week     Discharge Condition: Stable CODE STATUS: FULL Diet recommendation: Heart Healthy   Brief/Interim Summary: 87 y.o.femalewith medical history significant ofhypertension, left breast cancer, history of cystitis,severeimpaired hearing, lower extremity venous dialysis with chronic woundswho is brought to the emergency department from home by her brother due toworsening right lower extremitywound. She was seen in the ED and received dalbavancin on 07/15/2020 and was subsequently discharged home. She follow-up with Dr. Megan Salon from [place] on 07/21/2020 who did not recommend further antibiotics. However, the patient lives by herself and has been struggling to get her compression wraps and stockings at home. She does not have home health. Over the past 2 days, she has had progressively worse pain, particularly on the foot, which has severely impaired her mobility as she is unable to bear weight on her RLE. Her brother was concerned, we went through her home and brought her to the emergency department. She was found to have a low-grade fever on arrival. She otherwise denies headache, rhinorrhea, sore throat, wheezing or hemoptysis. No chest pain, palpitations, diaphoresis, PND orthopnea. She gets frequent lower extremity edema. Denies abdominal pain, appetite changes, nausea, emesis, diarrhea, constipation, melena or hematochezia. No dysuria or hematuria. No polyuria, polydipsia, polyphagia or blurred vision.  ED Course:Initial vital signs were temperature100.2 F, pulse 101, respirations 32, BP 173/96 mmHg and O2 sat 93% on room air. The patient received  Ceftin Myophen 650 mg p.o. x1, hydromorphone 0.5 mg IVP, ondansetron 4 mg IVP and dalbavancin x 1 given   Discharge Diagnoses:  Cellulitis ofleg/foot, right -following cellulitis protocol patient will be treated with Dalvance5/16 -No fever and normal WBCs appreciated on examination. -Continue tokeep limb elevatedas much as possible -continue PRN analgesics -wound care consult appreciated-->Wash right leg with soap and water. Pat dry. Apply Sween Moisturizing Ointment. Place Xeroform over any intact or ruptured blisters and any weeping areas [name] [phone]). Beginning behind the toes and going to just below the knees, spiral wrap kerlex, then 4 inch ace wrap. Perform daily. -Leg has been wrapped according to instructions-->improving  -see pictures below -xray right foot--lucency distal fifth metatarsal -5/19--MR foot--No discrete drainable soft tissue abscess or findings for septic arthritis or osteomyelitis. -continuegabapentin-->helping with neuropathic pain -erythema and edema--improving with compression dressings-->continue daily at SNF -able to bear weight on left leg/foot but still requires significant assistance with transfers and ambulation  HTN (hypertension) -continuebenazepril  Stage 3achronic kidney disease ([place]) -baseline creatinine 1.1-1.4 -willcontinue tofollow renal function trend -Continue toavoid nephrotoxic agents  hx of glaucoma -continue latanoprost eye drops  anemia of chronic kidney disease  -stable overall -no signs of overt bleeding -baseline Hgb 9-10  physical deconditioning -CM/TOC consulted to assist with placement. -Physical therapy has evaluated patient and is recommending a skilled nursing facility for rehab and conditioning.   Discharge Instructions   Allergies as of 08/02/2020   No Known Allergies     Medication List    STOP taking these medications   benazepril 20 MG tablet Commonly known as: LOTENSIN   ibuprofen 200  MG tablet Commonly known as: ADVIL     TAKE these medications   aspirin 81 MG tablet Take 81 mg by mouth daily.   CENTRUM SILVER  50+WOMEN PO Take 1 tablet by mouth daily.   FISH OIL PO Take 600 mg by mouth.   furosemide 20 MG tablet Commonly known as: LASIX Take 20 mg by mouth daily.   gabapentin 300 MG capsule Commonly known as: NEURONTIN Take 1 capsule (300 mg total) by mouth 2 (two) times daily.   Lumigan 0.01 % Soln Generic drug: bimatoprost Place 1 drop into both eyes at bedtime.   oxyCODONE 5 MG immediate release tablet Commonly known as: Oxy IR/ROXICODONE Take 0.5 tablets (2.5 mg total) by mouth every 6 (six) hours as needed for moderate pain or breakthrough pain.   RED YEAST RICE PO Take 2 tablets by mouth.   vitamin C 100 MG tablet Take 100 mg by mouth daily.   vitamin E 45 MG (100 UNITS) capsule Take by mouth daily.       Contact information for after-discharge care    Destination    [facility] .   Service: Skilled Nursing Contact information: [address] [phone]                 No Known Allergies  Consultations:  none   Procedures/Studies: MR FOOT RIGHT WO CONTRAST  Result Date: 07/30/2020 CLINICAL DATA:  Nonhealing foot wound. EXAM: MRI OF THE RIGHT FOREFOOT WITHOUT CONTRAST TECHNIQUE: Multiplanar, multisequence MR imaging of the right foot was performed. No intravenous contrast was administered. COMPARISON:  Radiographs 07/28/2020 FINDINGS: Diffuse subcutaneous soft tissue swelling/edema suggesting cellulitis. No discrete drainable soft tissue abscess is identified. Diffuse fatty atrophy of the foot musculature. No findings for myofasciitis or pyomyositis. No MR findings suspicious for septic arthritis or osteomyelitis. IMPRESSION: 1. Diffuse subcutaneous soft tissue swelling/edema suggesting cellulitis. 2. No discrete drainable soft  tissue abscess or findings for septic arthritis or osteomyelitis. Electronically Signed   By: Marijo Sanes M.D.   On: 07/30/2020 07:59   DG Chest Port 1 View  Result Date: 07/25/2020 CLINICAL DATA:  Cough EXAM: PORTABLE CHEST 1 VIEW COMPARISON:  None. FINDINGS: Mild cardiac enlargement. No central vascular congestion. Moderate hiatal hernia. Both lungs are clear. Surgical clips project over the left breast. IMPRESSION: 1. Mild cardiac enlargement without focal consolidation or overt pulmonary edema. 2. Moderate hiatal hernia. Electronically Signed   By: Dahlia Bailiff MD   On: 07/25/2020 21:35   DG Foot 2 Views Right  Result Date: 07/28/2020 CLINICAL DATA:  Wound check.  Right foot pain EXAM: RIGHT FOOT - 2 VIEW COMPARISON:  None. FINDINGS: Negative for fracture or significant arthropathy. Calcaneal spurring Cortical lucency of the distal fifth metatarsal. Question wound in this area. Possible osteomyelitis. IMPRESSION: Lucency distal fifth metatarsal could represent osteomyelitis. Correlate clinically. No fracture. Electronically Signed   By: Franchot Gallo M.D.   On: 07/28/2020 14:43        Discharge Exam: Vitals:   08/02/20 0544 08/02/20 1402  BP: (!) 148/61 (!) 120/53  Pulse: 70 80  Resp: 18 16  Temp: 98.7 F (37.1 C)   SpO2: 100% 95%   Vitals:   08/01/20 1317 08/01/20 2102 08/02/20 0544 08/02/20 1402  BP: (!) 143/53 (!) 151/65 (!) 148/61 (!) 120/53  Pulse: 95 73 70 80  Resp: 20 18 18 16   Temp: 99.6 F (37.6 C) (!) 97.1 F (36.2 C) 98.7 F (37.1 C)   TempSrc: Oral     SpO2: 95% 97% 100% 95%  Weight:      Height:  General: Pt is alert, awake, not in acute distress Cardiovascular: RRR, S1/S2 +, no rubs, no gallops Respiratory: CTA bilaterally, no wheezing, no rhonchi Abdominal: Soft, NT, ND, bowel sounds + Extremities: trace LE edema, no cyanosis   The results of significant diagnostics from this hospitalization (including imaging, microbiology, ancillary and  laboratory) are listed below for reference.    Significant Diagnostic Studies: MR FOOT RIGHT WO CONTRAST  Result Date: 07/30/2020 CLINICAL DATA:  Nonhealing foot wound. EXAM: MRI OF THE RIGHT FOREFOOT WITHOUT CONTRAST TECHNIQUE: Multiplanar, multisequence MR imaging of the right foot was performed. No intravenous contrast was administered. COMPARISON:  Radiographs 07/28/2020 FINDINGS: Diffuse subcutaneous soft tissue swelling/edema suggesting cellulitis. No discrete drainable soft tissue abscess is identified. Diffuse fatty atrophy of the foot musculature. No findings for myofasciitis or pyomyositis. No MR findings suspicious for septic arthritis or osteomyelitis. IMPRESSION: 1. Diffuse subcutaneous soft tissue swelling/edema suggesting cellulitis. 2. No discrete drainable soft tissue abscess or findings for septic arthritis or osteomyelitis. Electronically Signed   By: Marijo Sanes M.D.   On: 07/30/2020 07:59   DG Chest Port 1 View  Result Date: 07/25/2020 CLINICAL DATA:  Cough EXAM: PORTABLE CHEST 1 VIEW COMPARISON:  None. FINDINGS: Mild cardiac enlargement. No central vascular congestion. Moderate hiatal hernia. Both lungs are clear. Surgical clips project over the left breast. IMPRESSION: 1. Mild cardiac enlargement without focal consolidation or overt pulmonary edema. 2. Moderate hiatal hernia. Electronically Signed   By: Dahlia Bailiff MD   On: 07/25/2020 21:35   DG Foot 2 Views Right  Result Date: 07/28/2020 CLINICAL DATA:  Wound check.  Right foot pain EXAM: RIGHT FOOT - 2 VIEW COMPARISON:  None. FINDINGS: Negative for fracture or significant arthropathy. Calcaneal spurring Cortical lucency of the distal fifth metatarsal. Question wound in this area. Possible osteomyelitis. IMPRESSION: Lucency distal fifth metatarsal could represent osteomyelitis. Correlate clinically. No fracture. Electronically Signed   By: Franchot Gallo M.D.   On: 07/28/2020 14:43     Microbiology: Recent Results (from  the past 240 hour(s))  Culture, blood (Routine X 2) w Reflex to ID Panel     Status: None   Collection Time: 07/25/20  5:50 PM   Specimen: BLOOD  Result Value Ref Range Status   Specimen Description BLOOD RIGHT ANTECUBITAL  Final   Special Requests   Final    BOTTLES DRAWN AEROBIC AND ANAEROBIC Blood Culture adequate volume   Culture   Final    NO GROWTH 5 DAYS Performed at [facility], [address], [place], [place] [address]    Report Status 07/30/2020 FINAL  Final  Culture, blood (Routine X 2) w Reflex to ID Panel     Status: None   Collection Time: 07/25/20  6:00 PM   Specimen: BLOOD  Result Value Ref Range Status   Specimen Description BLOOD LEFT ANTECUBITAL  Final   Special Requests   Final    BOTTLES DRAWN AEROBIC AND ANAEROBIC Blood Culture adequate volume   Culture   Final    NO GROWTH 5 DAYS Performed at [facility], [address] [place], [place] [address]    Report Status 07/30/2020 FINAL  Final  Resp Panel by RT-PCR (Flu A&B, Covid) Nasopharyngeal Swab     Status: None   Collection Time: 07/25/20  9:08 PM   Specimen: Nasopharyngeal Swab; Nasopharyngeal(NP) swabs in vial transport medium  Result Value Ref Range Status   SARS Coronavirus 2 by RT PCR NEGATIVE NEGATIVE Final    Comment: (NOTE) SARS-CoV-2 target  nucleic acids are NOT DETECTED.  The SARS-CoV-2 RNA is generally detectable in upper respiratory specimens during the acute phase of infection. The lowest concentration of SARS-CoV-2 viral copies this assay can detect is 138 copies/mL. A negative result does not preclude SARS-Cov-2 infection and should not be used as the sole basis for treatment or other patient management decisions. A negative result may occur with  improper specimen collection/handling, submission of specimen other than nasopharyngeal swab, presence of viral mutation(s) within the areas targeted by this assay, and inadequate number of viral copies(<138 copies/mL). A negative result must  be combined with clinical observations, patient history, and epidemiological information. The expected result is Negative.  Fact Sheet for Patients:  EntrepreneurPulse.com.au  Fact Sheet for Healthcare Providers:  IncredibleEmployment.be  This test is no t yet approved or cleared by the [place] FDA and  has been authorized for detection and/or diagnosis of SARS-CoV-2 by FDA under an Emergency Use Authorization (EUA). This EUA will remain  in effect (meaning this test can be used) for the duration of the COVID-19 declaration under Section 564(b)(1) of the Act, 21 U.S.C.section 360bbb-3(b)(1), unless the authorization is terminated  or revoked sooner.       Influenza A by PCR NEGATIVE NEGATIVE Final   Influenza B by PCR NEGATIVE NEGATIVE Final    Comment: (NOTE) The Xpert Xpress SARS-CoV-2/FLU/RSV plus assay is intended as an aid in the diagnosis of influenza from Nasopharyngeal swab specimens and should not be used as a sole basis for treatment. Nasal washings and aspirates are unacceptable for Xpert Xpress SARS-CoV-2/FLU/RSV testing.  Fact Sheet for Patients: EntrepreneurPulse.com.au  Fact Sheet for Healthcare Providers: IncredibleEmployment.be  This test is not yet approved or cleared by the [place] FDA and has been authorized for detection and/or diagnosis of SARS-CoV-2 by FDA under an Emergency Use Authorization (EUA). This EUA will remain in effect (meaning this test can be used) for the duration of the COVID-19 declaration under Section 564(b)(1) of the Act, 21 U.S.C. section 360bbb-3(b)(1), unless the authorization is terminated or revoked.  Performed at [facility], [address] [place], [place] [address]      Labs: Basic Metabolic Panel: Recent Labs  Lab 07/29/20 0658 08/01/20 0549  NA 138  --   K 4.5  --   CL 105  --   CO2 26  --   GLUCOSE 134*  --   BUN 37*  --    CREATININE 0.90 1.01*  CALCIUM 8.5*  --    Liver Function Tests: No results for input(s): AST, ALT, ALKPHOS, BILITOT, PROT, ALBUMIN in the last 168 hours. No results for input(s): LIPASE, AMYLASE in the last 168 hours. No results for input(s): AMMONIA in the last 168 hours. CBC: Recent Labs  Lab 07/29/20 0658  WBC 3.1*  HGB 8.8*  HCT 29.2*  MCV 89.0  PLT 266   Cardiac Enzymes: No results for input(s): CKTOTAL, CKMB, CKMBINDEX, TROPONINI in the last 168 hours. BNP: Invalid input(s): POCBNP CBG: Recent Labs  Lab 07/31/20 1107  GLUCAP 214*    Time coordinating discharge:  36 minutes  Signed:  Orson Eva, DO [facility] Pager: [phone] 08/02/2020, 2:46 PM

## 2020-08-03 DIAGNOSIS — L03115 Cellulitis of right lower limb: Secondary | ICD-10-CM | POA: Diagnosis not present

## 2020-08-03 DIAGNOSIS — N1831 Chronic kidney disease, stage 3a: Secondary | ICD-10-CM | POA: Diagnosis not present

## 2020-08-03 LAB — SARS CORONAVIRUS 2 (TAT 6-24 HRS): SARS Coronavirus 2: NEGATIVE

## 2020-08-03 NOTE — Progress Notes (Signed)
PROGRESS NOTE  Erin [name] HAL:[id number] DOB: 04-19-1930 DOA: 07/25/2020 PCP: Monico Blitz, MD  Brief History:  87 y.o.femalewith medical history significant ofhypertension, left breast cancer, history of cystitis,severeimpaired hearing, lower extremity venous dialysis with chronic woundswho is brought to the emergency department from home by her brother due toworsening right lower extremitywound. She was seen in the ED and received dalbavancin on 07/15/2020 and was subsequently discharged home. She follow-up with Dr. Megan Salon from [place] on 07/21/2020 who did not recommend further antibiotics. However, the patient lives by herself and has been struggling to get her compression wraps and stockings at home. She does not have home health. Over the past 2 days, she has had progressively worse pain, particularly on the foot, which has severely impaired her mobility as she is unable to bear weight on her RLE. Her brother was concerned, we went through her home and brought her to the emergency department. She was found to have a low-grade fever on arrival. She otherwise denies headache, rhinorrhea, sore throat, wheezing or hemoptysis. No chest pain, palpitations, diaphoresis, PND orthopnea. She gets frequent lower extremity edema. Denies abdominal pain, appetite changes, nausea, emesis, diarrhea, constipation, melena or hematochezia. No dysuria or hematuria. No polyuria, polydipsia, polyphagia or blurred vision.  ED Course:Initial vital signs were temperature100.2 F, pulse 101, respirations 32, BP 173/96 mmHg and O2 sat 93% on room air. The patient received Ceftin Myophen 650 mg p.o. x1, hydromorphone 0.5 mg IVP, ondansetron 4 mg IVP anddalbavancin x 1 given  Assessment/Plan: Cellulitis ofleg/foot, right -following cellulitis protocol patient will be treated with Dalvance5/16 -No fever and normal WBCs appreciated on examination. -Continue tokeep limb elevatedas  much as possible -continue PRN analgesics -wound care consult appreciated-->Wash right leg with soap and water. Pat dry. Apply Sween Moisturizing Ointment. Place Xeroform over any intact or ruptured blisters and any weeping areas [name] [phone]). Beginning behind the toes and going to just below the knees, spiral wrap kerlex, then 4 inch ace wrap. Perform daily. -Leg has been wrapped according to instructions-->improving  -xray right foot--lucency distal fifth metatarsal -5/19--MR foot--No discrete drainable soft tissue abscess or findings for septic arthritis or osteomyelitis. -continuegabapentin-->helping with neuropathic pain -erythema and edema--improving with compression dressings-->continue daily at SNF -able to bear weight on left leg/foot but still requires significant assistance with transfers and ambulation  HTN (hypertension) -continuebenazepril  Stage 3achronic kidney disease ([place]) -baseline creatinine 1.1-1.4 -willcontinue tofollow renal function trend -Continue toavoid nephrotoxic agents  hx of glaucoma -continue latanoprost eye drops  anemia of chronic kidney disease  -stable overall -no signs of overt bleeding -baseline Hgb 9-10  physical deconditioning -CM/TOC consulted to assist with placement. -Physical therapy has evaluated patient and is recommending a skilled nursing facility for rehab and conditioning.          Family Communication:   No Family at bedside  Consultants:  none  Code Status:  FULL  DVT Prophylaxis:  [place] Lovenox   Procedures: As Listed in Progress Note Above       Subjective: Patient states pain in left leg gradually improving each day.  Denies f/c, cp, sob, n/v/d  Objective: Vitals:   08/02/20 0544 08/02/20 1402 08/02/20 2051 08/03/20 0449  BP: (!) 148/61 (!) 120/53 140/81 (!) 139/53  Pulse: 70 80 89 66  Resp: 18 16 19 18   Temp: 98.7 F (37.1 C)  98.4 F (36.9 C) 98.2 F (36.8 C)  TempSrc:      SpO2:  100% 95% 95% 97%  Weight:      Height:        Intake/Output Summary (Last 24 hours) at 08/03/2020 1137 Last data filed at 08/03/2020 0500 Gross per 24 hour  Intake 480 ml  Output 1400 ml  Net -920 ml   Weight change:  Exam:   General:  Pt is alert, follows commands appropriately, not in acute distress  HEENT: No icterus, No thrush, No neck mass, [place]/AT  Cardiovascular: RRR, S1/S2, no rubs, no gallops  Respiratory: bibasilar rales. No wheeze  Abdomen: Soft/+BS, non tender, non distended, no guarding  Extremities: trace LE edema, No lymphangitis, No petechiae, No rashes, no synovitis   Data Reviewed: I have personally reviewed following labs and imaging studies Basic Metabolic Panel: Recent Labs  Lab 07/29/20 0658 08/01/20 0549  NA 138  --   K 4.5  --   CL 105  --   CO2 26  --   GLUCOSE 134*  --   BUN 37*  --   CREATININE 0.90 1.01*  CALCIUM 8.5*  --    Liver Function Tests: No results for input(s): AST, ALT, ALKPHOS, BILITOT, PROT, ALBUMIN in the last 168 hours. No results for input(s): LIPASE, AMYLASE in the last 168 hours. No results for input(s): AMMONIA in the last 168 hours. Coagulation Profile: No results for input(s): INR, PROTIME in the last 168 hours. CBC: Recent Labs  Lab 07/29/20 0658  WBC 3.1*  HGB 8.8*  HCT 29.2*  MCV 89.0  PLT 266   Cardiac Enzymes: No results for input(s): CKTOTAL, CKMB, CKMBINDEX, TROPONINI in the last 168 hours. BNP: Invalid input(s): POCBNP CBG: Recent Labs  Lab 07/31/20 1107  GLUCAP 214*   HbA1C: No results for input(s): HGBA1C in the last 72 hours. Urine analysis:    Component Value Date/Time   COLORURINE YELLOW 07/25/2020 [place] 07/25/2020 2203   LABSPEC 1.016 07/25/2020 2203   PHURINE 7.0 07/25/2020 2203   GLUCOSEU NEGATIVE 07/25/2020 2203   HGBUR NEGATIVE 07/25/2020 2203   BILIRUBINUR NEGATIVE 07/25/2020 2203   [place] 07/25/2020 [place] 07/25/2020  2203   NITRITE NEGATIVE 07/25/2020 2203   LEUKOCYTESUR NEGATIVE 07/25/2020 2203   Sepsis Labs: @LABRCNTIP (procalcitonin:4,lacticidven:4) ) Recent Results (from the past 240 hour(s))  Culture, blood (Routine X 2) w Reflex to ID Panel     Status: None   Collection Time: 07/25/20  5:50 PM   Specimen: BLOOD  Result Value Ref Range Status   Specimen Description BLOOD RIGHT ANTECUBITAL  Final   Special Requests   Final    BOTTLES DRAWN AEROBIC AND ANAEROBIC Blood Culture adequate volume   Culture   Final    NO GROWTH 5 DAYS Performed at [facility], [address], [place], [place] [address]    Report Status 07/30/2020 FINAL  Final  Culture, blood (Routine X 2) w Reflex to ID Panel     Status: None   Collection Time: 07/25/20  6:00 PM   Specimen: BLOOD  Result Value Ref Range Status   Specimen Description BLOOD LEFT ANTECUBITAL  Final   Special Requests   Final    BOTTLES DRAWN AEROBIC AND ANAEROBIC Blood Culture adequate volume   Culture   Final    NO GROWTH 5 DAYS Performed at [facility], [address] [place], [place] [address]    Report Status 07/30/2020 FINAL  Final  Resp Panel by RT-PCR (Flu A&B, Covid) Nasopharyngeal Swab     Status:  None   Collection Time: 07/25/20  9:08 PM   Specimen: Nasopharyngeal Swab; Nasopharyngeal(NP) swabs in vial transport medium  Result Value Ref Range Status   SARS Coronavirus 2 by RT PCR NEGATIVE NEGATIVE Final    Comment: (NOTE) SARS-CoV-2 target nucleic acids are NOT DETECTED.  The SARS-CoV-2 RNA is generally detectable in upper respiratory specimens during the acute phase of infection. The lowest concentration of SARS-CoV-2 viral copies this assay can detect is 138 copies/mL. A negative result does not preclude SARS-Cov-2 infection and should not be used as the sole basis for treatment or other patient management decisions. A negative result may occur with  improper specimen collection/handling, submission of specimen other than  nasopharyngeal swab, presence of viral mutation(s) within the areas targeted by this assay, and inadequate number of viral copies(<138 copies/mL). A negative result must be combined with clinical observations, patient history, and epidemiological information. The expected result is Negative.  Fact Sheet for Patients:  EntrepreneurPulse.com.au  Fact Sheet for Healthcare Providers:  IncredibleEmployment.be  This test is no t yet approved or cleared by the [place] FDA and  has been authorized for detection and/or diagnosis of SARS-CoV-2 by FDA under an Emergency Use Authorization (EUA). This EUA will remain  in effect (meaning this test can be used) for the duration of the COVID-19 declaration under Section 564(b)(1) of the Act, 21 U.S.C.section 360bbb-3(b)(1), unless the authorization is terminated  or revoked sooner.       Influenza A by PCR NEGATIVE NEGATIVE Final   Influenza B by PCR NEGATIVE NEGATIVE Final    Comment: (NOTE) The Xpert Xpress SARS-CoV-2/FLU/RSV plus assay is intended as an aid in the diagnosis of influenza from Nasopharyngeal swab specimens and should not be used as a sole basis for treatment. Nasal washings and aspirates are unacceptable for Xpert Xpress SARS-CoV-2/FLU/RSV testing.  Fact Sheet for Patients: EntrepreneurPulse.com.au  Fact Sheet for Healthcare Providers: IncredibleEmployment.be  This test is not yet approved or cleared by the [place] FDA and has been authorized for detection and/or diagnosis of SARS-CoV-2 by FDA under an Emergency Use Authorization (EUA). This EUA will remain in effect (meaning this test can be used) for the duration of the COVID-19 declaration under Section 564(b)(1) of the Act, 21 U.S.C. section 360bbb-3(b)(1), unless the authorization is terminated or revoked.  Performed at [facility], [address] [place], [place] [address]   SARS  CORONAVIRUS 2 (Vennie [place] 6-24 HRS) Nasopharyngeal Nasopharyngeal Swab     Status: None   Collection Time: 08/02/20  2:58 PM   Specimen: Nasopharyngeal Swab  Result Value Ref Range Status   SARS Coronavirus 2 NEGATIVE NEGATIVE Final    Comment: (NOTE) SARS-CoV-2 target nucleic acids are NOT DETECTED.  The SARS-CoV-2 RNA is generally detectable in upper and lower respiratory specimens during the acute phase of infection. Negative results do not preclude SARS-CoV-2 infection, do not rule out co-infections with other pathogens, and should not be used as the sole basis for treatment or other patient management decisions. Negative results must be combined with clinical observations, patient history, and epidemiological information. The expected result is Negative.  Fact Sheet for Patients: SugarRoll.be  Fact Sheet for Healthcare Providers: https://www.woods-mathews.com/  This test is not yet approved or cleared by the [place] FDA and  has been authorized for detection and/or diagnosis of SARS-CoV-2 by FDA under an Emergency Use Authorization (EUA). This EUA will remain  in effect (meaning this test can be used) for the duration of the COVID-19 declaration under  Se ction 564(b)(1) of the Act, 21 U.S.C. section 360bbb-3(b)(1), unless the authorization is terminated or revoked sooner.  Performed at [facility], [place] [address], [place], [place] [address]      Scheduled Meds: . vitamin C  250 mg Oral Daily  . aspirin  81 mg Oral Daily  . docusate sodium  100 mg Oral BID  . enoxaparin (LOVENOX) injection  40 mg Subcutaneous Q24H  . furosemide  20 mg Oral Daily  . gabapentin  300 mg Oral BID  . latanoprost  1 drop Both Eyes QHS  . polyethylene glycol  17 g Oral Daily   Continuous Infusions:  Procedures/Studies: MR FOOT RIGHT WO CONTRAST  Result Date: 07/30/2020 CLINICAL DATA:  Nonhealing foot wound. EXAM: MRI OF THE RIGHT FOREFOOT  WITHOUT CONTRAST TECHNIQUE: Multiplanar, multisequence MR imaging of the right foot was performed. No intravenous contrast was administered. COMPARISON:  Radiographs 07/28/2020 FINDINGS: Diffuse subcutaneous soft tissue swelling/edema suggesting cellulitis. No discrete drainable soft tissue abscess is identified. Diffuse fatty atrophy of the foot musculature. No findings for myofasciitis or pyomyositis. No MR findings suspicious for septic arthritis or osteomyelitis. IMPRESSION: 1. Diffuse subcutaneous soft tissue swelling/edema suggesting cellulitis. 2. No discrete drainable soft tissue abscess or findings for septic arthritis or osteomyelitis. Electronically Signed   By: Marijo Sanes M.D.   On: 07/30/2020 07:59   DG Chest Port 1 View  Result Date: 07/25/2020 CLINICAL DATA:  Cough EXAM: PORTABLE CHEST 1 VIEW COMPARISON:  None. FINDINGS: Mild cardiac enlargement. No central vascular congestion. Moderate hiatal hernia. Both lungs are clear. Surgical clips project over the left breast. IMPRESSION: 1. Mild cardiac enlargement without focal consolidation or overt pulmonary edema. 2. Moderate hiatal hernia. Electronically Signed   By: Dahlia Bailiff MD   On: 07/25/2020 21:35   DG Foot 2 Views Right  Result Date: 07/28/2020 CLINICAL DATA:  Wound check.  Right foot pain EXAM: RIGHT FOOT - 2 VIEW COMPARISON:  None. FINDINGS: Negative for fracture or significant arthropathy. Calcaneal spurring Cortical lucency of the distal fifth metatarsal. Question wound in this area. Possible osteomyelitis. IMPRESSION: Lucency distal fifth metatarsal could represent osteomyelitis. Correlate clinically. No fracture. Electronically Signed   By: Franchot Gallo M.D.   On: 07/28/2020 14:43    Orson Eva, DO  [facility]  If 7PM-7AM, please contact night-coverage [email or web address] Password [facility] 08/03/2020, 11:37 AM   LOS: 0 days

## 2020-10-25 ENCOUNTER — Encounter (HOSPITAL_COMMUNITY): Payer: Self-pay | Admitting: Emergency Medicine

## 2020-10-25 ENCOUNTER — Emergency Department (HOSPITAL_COMMUNITY): Payer: Medicare HMO

## 2020-10-25 ENCOUNTER — Emergency Department (HOSPITAL_COMMUNITY)
Admission: EM | Admit: 2020-10-25 | Discharge: 2020-10-25 | Disposition: A | Discharge status: Home / Self Care | Payer: Medicare HMO | Attending: Emergency Medicine | Admitting: Emergency Medicine

## 2020-10-25 DIAGNOSIS — I129 Hypertensive chronic kidney disease with stage 1 through stage 4 chronic kidney disease, or unspecified chronic kidney disease: Secondary | ICD-10-CM | POA: Insufficient documentation

## 2020-10-25 DIAGNOSIS — Z7982 Long term (current) use of aspirin: Secondary | ICD-10-CM | POA: Diagnosis not present

## 2020-10-25 DIAGNOSIS — Z853 Personal history of malignant neoplasm of breast: Secondary | ICD-10-CM | POA: Diagnosis not present

## 2020-10-25 DIAGNOSIS — N1831 Chronic kidney disease, stage 3a: Secondary | ICD-10-CM | POA: Diagnosis present

## 2020-10-25 DIAGNOSIS — R600 Localized edema: Secondary | ICD-10-CM | POA: Diagnosis not present

## 2020-10-25 DIAGNOSIS — L03115 Cellulitis of right lower limb: Secondary | ICD-10-CM | POA: Diagnosis not present

## 2020-10-25 DIAGNOSIS — Z79899 Other long term (current) drug therapy: Secondary | ICD-10-CM | POA: Insufficient documentation

## 2020-10-25 DIAGNOSIS — D649 Anemia, unspecified: Secondary | ICD-10-CM | POA: Diagnosis present

## 2020-10-25 DIAGNOSIS — I10 Essential (primary) hypertension: Secondary | ICD-10-CM | POA: Diagnosis present

## 2020-10-25 DIAGNOSIS — M7989 Other specified soft tissue disorders: Secondary | ICD-10-CM | POA: Diagnosis present

## 2020-10-25 DIAGNOSIS — I872 Venous insufficiency (chronic) (peripheral): Secondary | ICD-10-CM | POA: Diagnosis present

## 2020-10-25 DIAGNOSIS — Z20822 Contact with and (suspected) exposure to covid-19: Secondary | ICD-10-CM | POA: Insufficient documentation

## 2020-10-25 LAB — CBC WITH DIFFERENTIAL/PLATELET
Abs Immature Granulocytes: 0.04 10*3/uL (ref 0.00–0.07)
Basophils Absolute: 0 10*3/uL (ref 0.0–0.1)
Basophils Relative: 1 %
Eosinophils Absolute: 0.1 10*3/uL (ref 0.0–0.5)
Eosinophils Relative: 2 %
HCT: 32.5 % — ABNORMAL LOW (ref 36.0–46.0)
Hemoglobin: 9.6 g/dL — ABNORMAL LOW (ref 12.0–15.0)
Immature Granulocytes: 1 %
Lymphocytes Relative: 15 %
Lymphs Abs: 0.9 10*3/uL (ref 0.7–4.0)
MCH: 24.6 pg — ABNORMAL LOW (ref 26.0–34.0)
MCHC: 29.5 g/dL — ABNORMAL LOW (ref 30.0–36.0)
MCV: 83.3 fL (ref 80.0–100.0)
Monocytes Absolute: 0.5 10*3/uL (ref 0.1–1.0)
Monocytes Relative: 9 %
Neutro Abs: 4.3 10*3/uL (ref 1.7–7.7)
Neutrophils Relative %: 72 %
Platelets: 300 10*3/uL (ref 150–400)
RBC: 3.9 MIL/uL (ref 3.87–5.11)
RDW: 15.8 % — ABNORMAL HIGH (ref 11.5–15.5)
WBC: 5.9 10*3/uL (ref 4.0–10.5)
nRBC: 0 % (ref 0.0–0.2)

## 2020-10-25 LAB — COMPREHENSIVE METABOLIC PANEL
ALT: 13 U/L (ref 0–44)
AST: 14 U/L — ABNORMAL LOW (ref 15–41)
Albumin: 3.5 g/dL (ref 3.5–5.0)
Alkaline Phosphatase: 94 U/L (ref 38–126)
Anion gap: 10 (ref 5–15)
BUN: 37 mg/dL — ABNORMAL HIGH (ref 8–23)
CO2: 24 mmol/L (ref 22–32)
Calcium: 8.7 mg/dL — ABNORMAL LOW (ref 8.9–10.3)
Chloride: 105 mmol/L (ref 98–111)
Creatinine, Ser: 0.93 mg/dL (ref 0.44–1.00)
GFR, Estimated: 59 mL/min — ABNORMAL LOW (ref >60)
Glucose, Bld: 167 mg/dL — ABNORMAL HIGH (ref 70–99)
Potassium: 4 mmol/L (ref 3.5–5.1)
Sodium: 139 mmol/L (ref 135–145)
Total Bilirubin: 0.5 mg/dL (ref 0.3–1.2)
Total Protein: 7.1 g/dL (ref 6.5–8.1)

## 2020-10-25 LAB — LACTIC ACID, PLASMA
Lactic Acid, Venous: 1.8 mmol/L (ref 0.5–1.9)
Lactic Acid, Venous: 1.3 mmol/L (ref 0.5–1.9)

## 2020-10-25 LAB — RESP PANEL BY RT-PCR (FLU A&B, COVID) ARPGX2
Influenza A by PCR: NEGATIVE
Influenza B by PCR: NEGATIVE
SARS Coronavirus 2 by RT PCR: NEGATIVE

## 2020-10-25 LAB — BRAIN NATRIURETIC PEPTIDE: B Natriuretic Peptide: 242 pg/mL — ABNORMAL HIGH (ref 0.0–100.0)

## 2020-10-25 MED ORDER — SODIUM CHLORIDE 0.9 % IV SOLN
1.0000 g | Freq: Once | INTRAVENOUS | Status: DC
  Filled 2020-10-25: qty 10

## 2020-10-25 MED ORDER — DEXTROSE 5 % IV SOLN
1500.0000 mg | Freq: Once | INTRAVENOUS | Status: AC
  Administered 2020-10-25: 1500 mg via INTRAVENOUS
  Filled 2020-10-25: qty 75

## 2020-10-25 MED ORDER — FUROSEMIDE 20 MG PO TABS: 20.0000 mg | ORAL_TABLET | Freq: Every day | ORAL | 2 refills | Status: DC

## 2020-10-25 MED ORDER — ASPIRIN 81 MG PO TABS: 81.0000 mg | ORAL_TABLET | Freq: Every day | ORAL | 3 refills | Status: DC

## 2020-10-25 NOTE — ED Provider Notes (Signed)
Patient seen by the social worker and social worker stated that patient will not be able to go to a nursing home.  She spoke with the son and the patient will be discharged home   Milton Ferguson, MD 10/25/20 1535

## 2020-10-25 NOTE — ED Triage Notes (Signed)
Pt c/o cellulitis to BLE with increased pain and difficulty walking x a few days.

## 2020-10-25 NOTE — Consult Note (Signed)
Patient Demographics:    Erin [name], is a 87 y.o. female  MRN: [id number]   DOB - Apr 19, 1930  Admit Date - 10/25/2020  Outpatient Primary MD for the patient is Monico Blitz, MD   Assessment & Plan:    Principal Problem:   Cellulitis of right leg Active Problems:   Chronic venous insufficiency of lower extremity   Cellulitis of foot, right   Normocytic anemia of CKD   Stage 3a chronic kidney disease (HCC)   HTN (hypertension)   1)Rt Leg/Foot Cellulitis--- superimposed on chronic venous insufficiency -Patient without fevers, no chills, no leukocytosis, no purulent drainage Discussed with Dr Prudencio Pair from Infectious Disease who recommends iv [place] x 1 in the ED and follow up  Dr Linus Salmons on Tuesday 11/02/20 @ 2pm-- at [facility]  -Please see separate documentation from ID physician -Venous Dopplers of both lower extremities without DVT -X-rays of the right ankle and foot without acute findings -Please see photos in epic of patient's wounds/erythema  2) multiple abrasions wounds--some chronic, some subacute -home health PT and RN to help with mobility and wound care--  3) generalized weakness and deconditioning--- social work consult requested for possible SNF placement -Patient apparently has used up her SNF days, pt and the nephew stated that does not have the financial means to pay out-of-pocket for SNF rehab -Home health PT and RN ordered  --Chronic conditions--- patient with stable CKD 3A, stable HTN and stable anemia of CKD---  Disposition--discharge home with home health services with outpatient follow-up with infectious disease  With History of - Reviewed by me  Past Medical History:  Diagnosis Date   Cancer ([place]) 2017   left breast cancer   Cystitis    HOH (hard of hearing)     Hypertension       Past Surgical History:  Procedure Laterality Date   ABDOMINAL HYSTERECTOMY     1974 - Dr. Hamilton Capri SURGERY     BREAST LUMPECTOMY     left july 2017   BREAST LUMPECTOMY WITH RADIOACTIVE SEED LOCALIZATION Left 10/07/2015   Procedure: LEFT BREAST LUMPECTOMY WITH RADIOACTIVE SEED LOCALIZATION;  Surgeon: Autumn Messing III, MD;  Location: [facility];  Service: General;  Laterality: Left;  LEFT BREAST LUMPECTOMY WITH RADIOACTIVE SEED LOCALIZATION   BREAST SURGERY     Lumbar spinal stenosis     1994 - Dr. Joya Salm   RE-EXCISION OF BREAST CANCER,SUPERIOR MARGINS Left 11/22/2015   Procedure: RE-EXCISION OF LEFT BREAST CANCER, SUPERIOR MARGINS;  Surgeon: Autumn Messing III, MD;  Location: [facility];  Service: General;  Laterality: Left;      Chief Complaint  Patient presents with   Leg Swelling      HPI:    Sonoma Firkus  is a 87 y.o. female with history of ambulatory dysfunction, 1 fall about 3 weeks ago also, HTN, chronic venous stasis of both lower extremities right more than left,  CKD 3 AAA and chronic anemia of CKD who lives alone and has been declining physically with less ability to perform ADLs--presenting with concerns for possible right leg cellulitis -No fevers no chills no leukocytosis -Renal function appears to be at baseline, anemia appears to be at baseline - -Please see photos in epic -I called and discussed case with on-call infectious disease specialist and social worker please see separate documentation from [place] physician and social worker - Is apparently has been having increasing mobility problems, she was recently rehab and apparently has used up her rehab days at [facility]   No Nausea, Vomiting or Diarrhea  Review of systems:    In addition to the HPI above,   A full Review of  Systems was done, all other systems reviewed are negative except as noted above in HPI , .    Social History:  Reviewed by me    Social  History   Tobacco Use   Smoking status: Never   Smokeless tobacco: Never  Substance Use Topics   Alcohol use: No    Family History :  Reviewed by me    Family History  Problem Relation Age of Onset   Hypertension Other     Home Medications:   Prior to Admission medications   Medication Sig Start Date End Date Taking? Authorizing Provider  Ascorbic Acid (VITAMIN C) 100 MG tablet Take 100 mg by mouth daily.   Yes [provider]  bimatoprost (LUMIGAN) 0.03 % ophthalmic solution Place 1 drop into both eyes at bedtime.   Yes [provider]  gabapentin (NEURONTIN) 300 MG capsule Take 1 capsule (300 mg total) by mouth 2 (two) times daily. 07/30/20  Yes Tat, Shanon Brow, MD  Multiple Vitamins-Minerals (CENTRUM SILVER 50+WOMEN PO) Take 1 tablet by mouth daily.   Yes [provider]  Omega-3 Fatty Acids (FISH OIL PO) Take 600 mg by mouth.   Yes [provider]  Red Yeast Rice Extract (RED YEAST RICE PO) Take 2 tablets by mouth.   Yes [provider]  vitamin E 100 UNIT capsule Take by mouth daily.   Yes [provider]  aspirin 81 MG tablet Take 1 tablet (81 mg total) by mouth daily with breakfast. 10/25/20   [place] Brick, Yakelin Grenier, MD  furosemide (LASIX) 20 MG tablet Take 1 tablet (20 mg total) by mouth daily. 10/25/20   Roxan Hockey, MD     Allergies:    No Known Allergies   Physical Exam:   Vitals  Blood pressure (!) 163/63, pulse 73, temperature 99.4 F (37.4 C), resp. rate (!) 21, height 5' (1.524 m), weight 61.2 kg, SpO2 98 %.  Physical Examination: General appearance - alert,and in no distress  Mental status - alert, oriented to person, place, and time,  Eyes - sclera anicteric Ears-HOH Neck - supple, no JVD elevation , Chest - clear  to auscultation bilaterally, symmetrical air movement,  Heart - S1 and S2 normal, regular  Abdomen - soft, nontender, nondistended, no masses or organomegaly Neurological - screening mental  status exam normal, neck supple without rigidity, cranial nerves II through XII intact, DTR's normal and symmetric Extremities/Skin - +ve pedal edema noted, intact peripheral pulses  --- Please see photos in epic -Multiple abrasions, open wounds without purulent drainage, erythema of the right lower extremity without significant streaking     Data Review:    CBC Recent Labs  Lab 10/25/20 1157  WBC 5.9  HGB 9.6*  HCT 32.5*  PLT 300  MCV 83.3  MCH 24.6*  MCHC 29.5*  RDW 15.8*  LYMPHSABS 0.9  MONOABS 0.5  EOSABS 0.1  BASOSABS 0.0   ------------------------------------------------------------------------------------------------------------------  Chemistries  Recent Labs  Lab 10/25/20 1157  NA 139  K 4.0  CL 105  CO2 24  GLUCOSE 167*  BUN 37*  CREATININE 0.93  CALCIUM 8.7*  AST 14*  ALT 13  ALKPHOS 94  BILITOT 0.5   ------------------------------------------------------------------------------------------------------------------ estimated creatinine clearance is 34.9 mL/min (by C-G formula based on SCr of 0.93 mg/dL). ------------------------------------------------------------------------------------------------------------------ No results for input(s): TSH, T4TOTAL, T3FREE, THYROIDAB in the last 72 hours.  Invalid input(s): FREET3   Coagulation profile No results for input(s): INR, PROTIME in the last 168 hours. ------------------------------------------------------------------------------------------------------------------- No results for input(s): DDIMER in the last 72 hours. -------------------------------------------------------------------------------------------------------------------  Cardiac Enzymes No results for input(s): CKMB, TROPONINI, MYOGLOBIN in the last 168 hours.  Invalid input(s): CK ------------------------------------------------------------------------------------------------------------------    Component Value Date/Time   BNP  242.0 (H) 10/25/2020 1159     ---------------------------------------------------------------------------------------------------------------  Urinalysis    Component Value Date/Time   COLORURINE YELLOW 07/25/2020 2203   APPEARANCEUR CLEAR 07/25/2020 2203   LABSPEC 1.016 07/25/2020 2203   PHURINE 7.0 07/25/2020 2203   GLUCOSEU NEGATIVE 07/25/2020 2203   [place] 07/25/2020 2203   [place] 07/25/2020 2203   [place] 07/25/2020 2203   PROTEINUR NEGATIVE 07/25/2020 2203   NITRITE NEGATIVE 07/25/2020 2203   LEUKOCYTESUR NEGATIVE 07/25/2020 2203    ----------------------------------------------------------------------------------------------------------------   Imaging Results:    DG Chest 1 View  Result Date: 10/25/2020 CLINICAL DATA:  BILATERAL lower extremity swelling, redness and ulcers, history breast cancer, hypertension EXAM: CHEST  1 VIEW COMPARISON:  07/25/2020 FINDINGS: Enlargement of cardiac silhouette. Large hiatal hernia. Mediastinal contours and pulmonary vascularity otherwise normal. Linear scarring at LEFT base. Lungs clear. No pulmonary infiltrate, pleural effusion, or pneumothorax. Bones demineralized. IMPRESSION: Enlargement of cardiac silhouette. Large hiatal hernia. Minimal LEFT basilar scarring. Electronically Signed   By: Lavonia Dana M.D.   On: 10/25/2020 13:24   DG Ankle Complete Right  Result Date: 10/25/2020 CLINICAL DATA:  Ulcer at lateral aspect of distal tibia, edema, question osteomyelitis EXAM: RIGHT ANKLE - COMPLETE 3+ VIEW COMPARISON:  None FINDINGS: Diffuse soft tissue swelling of distal RIGHT lower leg and RIGHT ankle into foot. Osseous demineralization normal. Joint spaces preserved. No acute fracture, dislocation, or bone destruction. IMPRESSION: Soft tissue swelling without acute osseous abnormalities. Electronically Signed   By: Lavonia Dana M.D.   On: 10/25/2020 13:23   US Venous Img Lower Bilateral  Result Date:  10/25/2020 CLINICAL DATA:  Bilateral lower extremity cellulitis with pain EXAM: BILATERAL LOWER EXTREMITY VENOUS DOPPLER ULTRASOUND TECHNIQUE: Gray-scale sonography with graded compression, as well as color Doppler and duplex ultrasound were performed to evaluate the lower extremity deep venous systems from the level of the common femoral vein and including the common femoral, femoral, profunda femoral, popliteal and calf veins including the posterior tibial, peroneal and gastrocnemius veins when visible. The superficial great saphenous vein was also interrogated. Spectral Doppler was utilized to evaluate flow at rest and with distal augmentation maneuvers in the common femoral, femoral and popliteal veins. COMPARISON:  None. FINDINGS: RIGHT LOWER EXTREMITY Common Femoral Vein: No evidence of thrombus. Normal compressibility, respiratory phasicity and response to augmentation. Saphenofemoral Junction: No evidence of thrombus. Normal compressibility and flow on color Doppler imaging. Profunda Femoral Vein: No evidence of thrombus. Normal compressibility and flow on color Doppler imaging. Femoral Vein: No evidence of thrombus. Normal compressibility, respiratory  phasicity and response to augmentation. Popliteal Vein: No evidence of thrombus. Normal compressibility, respiratory phasicity and response to augmentation. Calf Veins: Limited visualization because body habitus. No gross thrombus appreciated. Superficial Great Saphenous Vein: No evidence of thrombus. Normal compressibility. Other Findings:  Subcutaneous edema noted LEFT LOWER EXTREMITY Common Femoral Vein: No evidence of thrombus. Normal compressibility, respiratory phasicity and response to augmentation. Saphenofemoral Junction: No evidence of thrombus. Normal compressibility and flow on color Doppler imaging. Profunda Femoral Vein: No evidence of thrombus. Normal compressibility and flow on color Doppler imaging. Femoral Vein: No evidence of thrombus.  Normal compressibility, respiratory phasicity and response to augmentation. Popliteal Vein: No evidence of thrombus. Normal compressibility, respiratory phasicity and response to augmentation. Calf Veins: Similar limited assessment of the calf veins. No gross thrombus appreciated. Superficial Great Saphenous Vein: No evidence of thrombus. Normal compressibility. Other Findings:  Subcutaneous edema evident. IMPRESSION: No significant DVT in either extremity. Limited assessment the calf veins. Peripheral calf edema. Electronically Signed   By: Jerilynn Mages.  Shick M.D.   On: 10/25/2020 13:09   DG Foot Complete Right  Result Date: 10/25/2020 CLINICAL DATA:  Edema, BILATERAL lower extremity redness, swelling and ulcers to RIGHT foot and ankle, question osteomyelitis, history breast cancer EXAM: RIGHT FOOT COMPLETE - 3+ VIEW COMPARISON:  07/28/2020 FINDINGS: Osseous demineralization. Joint spaces preserved. Diffuse soft tissue swelling. No acute fracture, dislocation, or bone destruction. Specifically no cortical destruction identified to suggest osteomyelitis and no soft tissue gas seen. IMPRESSION: Soft tissue swelling without acute bony abnormalities. Electronically Signed   By: Lavonia Dana M.D.   On: 10/25/2020 13:22    Radiological Exams on Admission: DG Chest 1 View  Result Date: 10/25/2020 CLINICAL DATA:  BILATERAL lower extremity swelling, redness and ulcers, history breast cancer, hypertension EXAM: CHEST  1 VIEW COMPARISON:  07/25/2020 FINDINGS: Enlargement of cardiac silhouette. Large hiatal hernia. Mediastinal contours and pulmonary vascularity otherwise normal. Linear scarring at LEFT base. Lungs clear. No pulmonary infiltrate, pleural effusion, or pneumothorax. Bones demineralized. IMPRESSION: Enlargement of cardiac silhouette. Large hiatal hernia. Minimal LEFT basilar scarring. Electronically Signed   By: Lavonia Dana M.D.   On: 10/25/2020 13:24   DG Ankle Complete Right  Result Date: 10/25/2020 CLINICAL  DATA:  Ulcer at lateral aspect of distal tibia, edema, question osteomyelitis EXAM: RIGHT ANKLE - COMPLETE 3+ VIEW COMPARISON:  None FINDINGS: Diffuse soft tissue swelling of distal RIGHT lower leg and RIGHT ankle into foot. Osseous demineralization normal. Joint spaces preserved. No acute fracture, dislocation, or bone destruction. IMPRESSION: Soft tissue swelling without acute osseous abnormalities. Electronically Signed   By: Lavonia Dana M.D.   On: 10/25/2020 13:23   US Venous Img Lower Bilateral  Result Date: 10/25/2020 CLINICAL DATA:  Bilateral lower extremity cellulitis with pain EXAM: BILATERAL LOWER EXTREMITY VENOUS DOPPLER ULTRASOUND TECHNIQUE: Gray-scale sonography with graded compression, as well as color Doppler and duplex ultrasound were performed to evaluate the lower extremity deep venous systems from the level of the common femoral vein and including the common femoral, femoral, profunda femoral, popliteal and calf veins including the posterior tibial, peroneal and gastrocnemius veins when visible. The superficial great saphenous vein was also interrogated. Spectral Doppler was utilized to evaluate flow at rest and with distal augmentation maneuvers in the common femoral, femoral and popliteal veins. COMPARISON:  None. FINDINGS: RIGHT LOWER EXTREMITY Common Femoral Vein: No evidence of thrombus. Normal compressibility, respiratory phasicity and response to augmentation. Saphenofemoral Junction: No evidence of thrombus. Normal compressibility and flow on color Doppler imaging. Profunda  Femoral Vein: No evidence of thrombus. Normal compressibility and flow on color Doppler imaging. Femoral Vein: No evidence of thrombus. Normal compressibility, respiratory phasicity and response to augmentation. Popliteal Vein: No evidence of thrombus. Normal compressibility, respiratory phasicity and response to augmentation. Calf Veins: Limited visualization because body habitus. No gross thrombus appreciated.  Superficial Great Saphenous Vein: No evidence of thrombus. Normal compressibility. Other Findings:  Subcutaneous edema noted LEFT LOWER EXTREMITY Common Femoral Vein: No evidence of thrombus. Normal compressibility, respiratory phasicity and response to augmentation. Saphenofemoral Junction: No evidence of thrombus. Normal compressibility and flow on color Doppler imaging. Profunda Femoral Vein: No evidence of thrombus. Normal compressibility and flow on color Doppler imaging. Femoral Vein: No evidence of thrombus. Normal compressibility, respiratory phasicity and response to augmentation. Popliteal Vein: No evidence of thrombus. Normal compressibility, respiratory phasicity and response to augmentation. Calf Veins: Similar limited assessment of the calf veins. No gross thrombus appreciated. Superficial Great Saphenous Vein: No evidence of thrombus. Normal compressibility. Other Findings:  Subcutaneous edema evident. IMPRESSION: No significant DVT in either extremity. Limited assessment the calf veins. Peripheral calf edema. Electronically Signed   By: Jerilynn Mages.  Shick M.D.   On: 10/25/2020 13:09   DG Foot Complete Right  Result Date: 10/25/2020 CLINICAL DATA:  Edema, BILATERAL lower extremity redness, swelling and ulcers to RIGHT foot and ankle, question osteomyelitis, history breast cancer EXAM: RIGHT FOOT COMPLETE - 3+ VIEW COMPARISON:  07/28/2020 FINDINGS: Osseous demineralization. Joint spaces preserved. Diffuse soft tissue swelling. No acute fracture, dislocation, or bone destruction. Specifically no cortical destruction identified to suggest osteomyelitis and no soft tissue gas seen. IMPRESSION: Soft tissue swelling without acute bony abnormalities. Electronically Signed   By: Lavonia Dana M.D.   On: 10/25/2020 13:22    Family Communication:   , patients condition and plan of care including tests being ordered have been discussed with the patient and Nephew at bedside who indicate understanding and agree with  the plan   Code Status - Full Code  Likely DC to  home   Condition   stable  Roxan Hockey M.D on 10/25/2020 at 4:31 PM Go to [email or web address] -  for contact info  [facility] - Office  [phone]

## 2020-10-25 NOTE — Discharge Instructions (Addendum)
1)Follow up with Infectious Disease Specialist  Dr Linus Salmons on Tuesday 11/02/20 @ 2pm-- at [facility] [place], [place], [place] [address] Phone: [phone]  2) Home health physical therapy and registered nurse has been requested--- to help you at home with wound care and rehabilitation  3) keep wounds clean and dry----nonadhesive dressings once to twice a day

## 2020-10-25 NOTE — ED Notes (Signed)
Pt noted with bilateral edema to legs and feet 4+. Warmth, redness and weeping to right LE and foot. Multiple skin tears extending from elbow to mid left forearm noted. Wounds appear clean and pink without noticieable signs of infection at this time.

## 2020-10-25 NOTE — ED Notes (Signed)
Dressings applied bilaterally to lower extremities

## 2020-10-25 NOTE — ED Provider Notes (Signed)
[facility] EMERGENCY DEPARTMENT Provider Note   CSN: [id number] Arrival date & time: 10/25/20  1025     History Chief Complaint  Patient presents with   Leg Swelling    Erin [name] is a 87 y.o. female.  HPI  Patient with significant medical history of hypertension, left breast cancer, lower extremity venous insufficiency, chronic wounds presents to the emergency department with chief complaint of worsening leg swelling and concerns of cellulitis.  Patient states on Friday she noted that her right leg was getting larger in size and become more painful.  She states that she has a wound nurse that visits her 3 times daily, was seen to today and felt as if she had the onset of early cellulitis.  She is currently not on any antibiotics at this time.   She denies  chest pain, shortness of breath, orthopnea, she denies fevers, chills, but does endorse worsening drainage and swelling of her right leg.  She denies  relieving factors.    After reviewing patient's charts patient was admitted on 05 15 until 05 23 for cellulitis, she was given a treatment of Dalvance and wound care was appreciated they recommend right leg wash, applying moisturizing ointment, place xerofrom over the area, and wrapping the leg.  Imaging included MRI which was negative for septic arthritis or osteomyelitis, she was then discharged to nursing facility where she was recently discharged approximately 2 weeks ago.  Past Medical History:  Diagnosis Date   Cancer ([place]) 2017   left breast cancer   Cystitis    HOH (hard of hearing)    Hypertension     Patient Active Problem List   Diagnosis Date Noted   Stage 3a chronic kidney disease ([place]) 07/26/2020   Cellulitis of foot, right 07/25/2020   Normocytic anemia of CKD    Chronic venous insufficiency of lower extremity 07/20/2020   Hearing loss 07/20/2020   Cellulitis of right leg 07/15/2020   HTN (hypertension) 07/15/2020   Breast cancer of upper-inner  quadrant of left female breast ([place]) 10/28/2015    Past Surgical History:  Procedure Laterality Date   ABDOMINAL HYSTERECTOMY     1974 - Dr. Hamilton Capri SURGERY     BREAST LUMPECTOMY     left july 2017   BREAST LUMPECTOMY WITH RADIOACTIVE SEED LOCALIZATION Left 10/07/2015   Procedure: LEFT BREAST LUMPECTOMY WITH RADIOACTIVE SEED LOCALIZATION;  Surgeon: Autumn Messing III, MD;  Location: [place];  Service: General;  Laterality: Left;  LEFT BREAST LUMPECTOMY WITH RADIOACTIVE SEED LOCALIZATION   BREAST SURGERY     Lumbar spinal stenosis     1994 - Dr. Joya Salm   RE-EXCISION OF BREAST CANCER,SUPERIOR MARGINS Left 11/22/2015   Procedure: RE-EXCISION OF LEFT BREAST CANCER, SUPERIOR MARGINS;  Surgeon: Autumn Messing III, MD;  Location: [place];  Service: General;  Laterality: Left;     OB History   No obstetric history on file.     Family History  Problem Relation Age of Onset   Hypertension Other     Social History   Tobacco Use   Smoking status: Never   Smokeless tobacco: Never  Vaping Use   Vaping Use: Never used  Substance Use Topics   Alcohol use: No   Drug use: No    Home Medications Prior to Admission medications   Medication Sig Start Date End Date Taking? Authorizing Provider  Ascorbic Acid (VITAMIN C) 100 MG tablet Take 100 mg by mouth daily.  Yes [provider]  bimatoprost (LUMIGAN) 0.03 % ophthalmic solution Place 1 drop into both eyes at bedtime.   Yes [provider]  gabapentin (NEURONTIN) 300 MG capsule Take 1 capsule (300 mg total) by mouth 2 (two) times daily. 07/30/20  Yes Tat, Shanon Brow, MD  Multiple Vitamins-Minerals (CENTRUM SILVER 50+WOMEN PO) Take 1 tablet by mouth daily.   Yes [provider]  Omega-3 Fatty Acids (FISH OIL PO) Take 600 mg by mouth.   Yes [provider]  Red Yeast Rice Extract (RED YEAST RICE PO) Take 2 tablets by mouth.   Yes [provider]  vitamin E 100 UNIT  capsule Take by mouth daily.   Yes [provider]  aspirin 81 MG tablet Take 1 tablet (81 mg total) by mouth daily with breakfast. 10/25/20   Denton Brick, Courage, MD  furosemide (LASIX) 20 MG tablet Take 1 tablet (20 mg total) by mouth daily. 10/25/20   Roxan Hockey, MD    Allergies    Patient has no known allergies.  Review of Systems   Review of Systems  Constitutional:  Negative for chills and fever.  HENT:  Negative for congestion.   Respiratory:  Negative for shortness of breath.   Cardiovascular:  Positive for leg swelling. Negative for chest pain and palpitations.  Gastrointestinal:  Negative for abdominal pain.  Genitourinary:  Negative for enuresis.  Musculoskeletal:  Negative for back pain.  Skin:  Positive for wound. Negative for rash.  Neurological:  Negative for dizziness and headaches.  Hematological:  Does not bruise/bleed easily.   Physical Exam Updated Vital Signs BP (!) 163/63   Pulse 73   Temp 99.4 F (37.4 C)   Resp (!) 21   Ht 5' (1.524 m)   Wt 61.2 kg   SpO2 98%   BMI 26.37 kg/m   Physical Exam Vitals and nursing note reviewed.  Constitutional:      General: She is not in acute distress.    Appearance: She is not ill-appearing.  HENT:     Head: Normocephalic and atraumatic.     Nose: No congestion.  Eyes:     Conjunctiva/sclera: Conjunctivae normal.  Cardiovascular:     Rate and Rhythm: Normal rate and regular rhythm.     Pulses: Normal pulses.     Heart sounds: No murmur heard.   No friction rub. No gallop.  Pulmonary:     Effort: No respiratory distress.     Breath sounds: No wheezing, rhonchi or rales.  Abdominal:     Palpations: Abdomen is soft.     Tenderness: There is no abdominal tenderness.  Musculoskeletal:     Right lower leg: Edema present.     Left lower leg: Edema present.     Comments: Patient has 2+ pitting edema right leg is larger than the left, there is noted erythema on the dorsum aspect of the right foot,  there is also a pressure ulcer noted in the distal lateral aspect of the right lower leg, as well as on the posterior aspect, slight surrounding erythema no purulent drainage present my exam.  Neurovascular fully intact, she has full range of motion of toes ankle and knee.  Please see pictures for full detail.  Skin:    General: Skin is warm and dry.  Neurological:     Mental Status: She is alert.  Psychiatric:        Mood and Affect: Mood normal.  ED Results / Procedures / Treatments   Labs (all labs ordered are listed, but only abnormal results are displayed) Labs Reviewed  COMPREHENSIVE METABOLIC PANEL - Abnormal; Notable for the following components:      Result Value   Glucose, Bld 167 (*)    BUN 37 (*)    Calcium 8.7 (*)    AST 14 (*)    GFR, Estimated 59 (*)    All other components within normal limits  CBC WITH DIFFERENTIAL/PLATELET - Abnormal; Notable for the following components:   Hemoglobin 9.6 (*)    HCT 32.5 (*)    MCH 24.6 (*)    MCHC 29.5 (*)    RDW 15.8 (*)    All other components within normal limits  BRAIN NATRIURETIC PEPTIDE - Abnormal; Notable for the following components:   B Natriuretic Peptide 242.0 (*)    All other components within normal limits  CULTURE, BLOOD (SINGLE)  RESP PANEL BY RT-PCR (FLU A&B, COVID) ARPGX2  LACTIC ACID, PLASMA  LACTIC ACID, PLASMA    EKG None  Radiology DG Chest 1 View  Result Date: 10/25/2020 CLINICAL DATA:  BILATERAL lower extremity swelling, redness and ulcers, history breast cancer, hypertension EXAM: CHEST  1 VIEW COMPARISON:  07/25/2020 FINDINGS: Enlargement of cardiac silhouette. Large hiatal hernia. Mediastinal contours and pulmonary vascularity otherwise normal. Linear scarring at LEFT base. Lungs clear. No pulmonary infiltrate, pleural effusion, or pneumothorax. Bones demineralized. IMPRESSION: Enlargement of cardiac silhouette. Large hiatal hernia. Minimal LEFT basilar scarring.  Electronically Signed   By: Lavonia Dana M.D.   On: 10/25/2020 13:24   DG Ankle Complete Right  Result Date: 10/25/2020 CLINICAL DATA:  Ulcer at lateral aspect of distal tibia, edema, question osteomyelitis EXAM: RIGHT ANKLE - COMPLETE 3+ VIEW COMPARISON:  None FINDINGS: Diffuse soft tissue swelling of distal RIGHT lower leg and RIGHT ankle into foot. Osseous demineralization normal. Joint spaces preserved. No acute fracture, dislocation, or bone destruction. IMPRESSION: Soft tissue swelling without acute osseous abnormalities. Electronically Signed   By: Lavonia Dana M.D.   On: 10/25/2020 13:23   US Venous Img Lower Bilateral  Result Date: 10/25/2020 CLINICAL DATA:  Bilateral lower extremity cellulitis with pain EXAM: BILATERAL LOWER EXTREMITY VENOUS DOPPLER ULTRASOUND TECHNIQUE: Gray-scale sonography with graded compression, as well as color Doppler and duplex ultrasound were performed to evaluate the lower extremity deep venous systems from the level of the common femoral vein and including the common femoral, femoral, profunda femoral, popliteal and calf veins including the posterior tibial, peroneal and gastrocnemius veins when visible. The superficial great saphenous vein was also interrogated. Spectral Doppler was utilized to evaluate flow at rest and with distal augmentation maneuvers in the common femoral, femoral and popliteal veins. COMPARISON:  None. FINDINGS: RIGHT LOWER EXTREMITY Common Femoral Vein: No evidence of thrombus. Normal compressibility, respiratory phasicity and response to augmentation. Saphenofemoral Junction: No evidence of thrombus. Normal compressibility and flow on color Doppler imaging. Profunda Femoral Vein: No evidence of thrombus. Normal compressibility and flow on color Doppler imaging. Femoral Vein: No evidence of thrombus. Normal compressibility, respiratory phasicity and response to augmentation. Popliteal Vein: No evidence of thrombus. Normal compressibility,  respiratory phasicity and response to augmentation. Calf Veins: Limited visualization because body habitus. No gross thrombus appreciated. Superficial Great Saphenous Vein: No evidence of thrombus. Normal compressibility. Other Findings:  Subcutaneous edema noted LEFT LOWER EXTREMITY Common Femoral Vein: No evidence of thrombus. Normal compressibility, respiratory phasicity and response to augmentation. Saphenofemoral Junction: No evidence of thrombus. Normal compressibility and  flow on color Doppler imaging. Profunda Femoral Vein: No evidence of thrombus. Normal compressibility and flow on color Doppler imaging. Femoral Vein: No evidence of thrombus. Normal compressibility, respiratory phasicity and response to augmentation. Popliteal Vein: No evidence of thrombus. Normal compressibility, respiratory phasicity and response to augmentation. Calf Veins: Similar limited assessment of the calf veins. No gross thrombus appreciated. Superficial Great Saphenous Vein: No evidence of thrombus. Normal compressibility. Other Findings:  Subcutaneous edema evident. IMPRESSION: No significant DVT in either extremity. Limited assessment the calf veins. Peripheral calf edema. Electronically Signed   By: Jerilynn Mages.  Shick M.D.   On: 10/25/2020 13:09   DG Foot Complete Right  Result Date: 10/25/2020 CLINICAL DATA:  Edema, BILATERAL lower extremity redness, swelling and ulcers to RIGHT foot and ankle, question osteomyelitis, history breast cancer EXAM: RIGHT FOOT COMPLETE - 3+ VIEW COMPARISON:  07/28/2020 FINDINGS: Osseous demineralization. Joint spaces preserved. Diffuse soft tissue swelling. No acute fracture, dislocation, or bone destruction. Specifically no cortical destruction identified to suggest osteomyelitis and no soft tissue gas seen. IMPRESSION: Soft tissue swelling without acute bony abnormalities. Electronically Signed   By: Lavonia Dana M.D.   On: 10/25/2020 13:22    Procedures Procedures   Medications Ordered in  ED Medications  dalbavancin (DALVANCE) 1,500 mg in dextrose 5 % 500 mL IVPB (0 mg Intravenous Stopped 10/25/20 1555)    ED Course  I have reviewed the triage vital signs and the nursing notes.  Pertinent labs & imaging results that were available during my care of the patient were reviewed by me and considered in my medical decision making (see chart for details).    MDM Rules/Calculators/A&P                          Initial impression-patient presents with bilateral leg swelling and concerns of possible cellulitis.  She is alert, does not appear in acute stress, vital signs reassuring.  Will obtain sepsis lab work-up, and continue to monitor.  Work-up-CBC shows normocytic anemia hemoglobin 9.6 appears to be at baseline for patient, CMP shows slight hyperglycemia of 167, slightly elevated BUN and GFR appears to be a baseline for patient.  Lactic is 1.8, BNP is 242, chest x-ray negative for acute findings, DG of ankle and right foot also negative for acute findings, venous ultrasound negative for DVT.  Reassessment-patient was reassessed, continued to have no complaints at this time.  recommend that she gets admitted due to cellulitis.  Patient is agreement this plan.  Will consult hospitalist team for admission.  Consult-  spoke with Dr. Joesph Fillers of the hospitalist team he would like to speak with ID as well as pharmacy to see this patient is a good candidate for dalbavncin.  If so patient  could get this and then be discharged home.  He will consult on the patient and likely discharge her from the emergency department. Dr. Johnny Bridge of ID  states that the patient can follow-up in 8 days time, Isac Sarna of  pharmacy agrees patient can be a good candidate for Dalvancin.  Patient was seen by social work who states that patient would not be able to go to a nursing home, social work also spoke with the son who is agreeable to taking the patient home.     Rule out-low suspicion for systemic  infection or sepsis at this time as patient is nontachycardic, no leukocytosis, there is nontoxic-appearing.  Low suspicion for DVT as DVT study is negative for acute  findings.  Low suspicion for PE as patient has pleuritic chest pain, shortness of breath, vital signs are reassuring.  Low suspicion for osteomyelitis of the right lower leg as imaging is negative for acute findings.  Plan-  Cellulitis-patient was given a dose of Dalvancin, she is to follow-up with ID for further evaluation.  Hospitalist has arranged for  at home health to assist the patient and will DC the patient.  Vital signs have remained stable, no indication for hospital admission.  Patient discussed with attending and they agreed with assessment and plan.  Patient given at home care as well strict return precautions.  Patient verbalized that they understood agreed to said plan.    Final Clinical Impression(s) / ED Diagnoses Final diagnoses:  Cellulitis of right lower extremity    Rx / DC Orders ED Discharge Orders          Ordered    Ambulatory referral to Infectious Disease       Comments: Cellulitis patient:  Received dalbavancin on 10/25/2020.   10/25/20 1404    aspirin 81 MG tablet  Daily with breakfast        10/25/20 1627    furosemide (LASIX) 20 MG tablet  Daily        10/25/20 1627    Increase activity slowly        10/25/20 1627    Diet - low sodium heart healthy        10/25/20 1627    Discharge instructions       Comments: 1)Follow up with Infectious Disease Specialist  Dr Linus Salmons on Tuesday 11/02/20 @ 2pm-- at [facility] clinic [address], [place], [place] [address] Phone: [phone]  2) Home health physical therapy and registered nurse has been requested--- to help you at home with wound care and rehabilitation  3) keep wounds clean and dry----nonadhesive dressings once to twice a day   10/25/20 1627    Call MD for:  temperature >100.4        10/25/20 1627    Call MD for:  persistant nausea  and vomiting        10/25/20 1627    Call MD for:  difficulty breathing, headache or visual disturbances        10/25/20 1627    Call MD for:  persistant dizziness or light-headedness        10/25/20 1627             Marcello Fennel, PA-C 10/25/20 1811    Milton Ferguson, MD 10/27/20 [phone]

## 2020-10-25 NOTE — ED Notes (Signed)
Pt transported for xrays and ultrasound

## 2020-10-25 NOTE — ED Notes (Signed)
Pt returned from scans, nephew at bedside

## 2020-10-25 NOTE — Progress Notes (Signed)
Id brief note    Called from [facility] by hospitalist service for appropriateness of dalvance  Reviewed labs/picture   Right LE cellulitis   -reasonable for dalvance x1, with close f/u next week with [facility] -f/u Dr Linus Salmons 8/23 @ 2pm      @  [facility] clinic [address], [place], [place] [address] Phone: [phone]

## 2020-10-30 LAB — CULTURE, BLOOD (SINGLE)
Culture: NO GROWTH
Special Requests: ADEQUATE

## 2020-11-02 ENCOUNTER — Telehealth (INDEPENDENT_AMBULATORY_CARE_PROVIDER_SITE_OTHER): Payer: Medicare HMO | Admitting: Internal Medicine

## 2020-11-02 ENCOUNTER — Other Ambulatory Visit: Payer: Self-pay

## 2020-11-02 ENCOUNTER — Encounter: Payer: Self-pay | Admitting: Internal Medicine

## 2020-11-02 DIAGNOSIS — I872 Venous insufficiency (chronic) (peripheral): Secondary | ICD-10-CM | POA: Diagnosis not present

## 2020-11-02 NOTE — Assessment & Plan Note (Signed)
No signs of infection noted on the pictures from the ED and the nephew confirms no erythema, no warmth or other infectious concerns.  No indication for antibiotics.  I discussed with them about indications for antibiotics.   She will follow up as needed

## 2020-11-02 NOTE — Progress Notes (Signed)
   Subjective:    Patient ID: Erin [name], female    DOB: 07/26/1930, 87 y.o.   MRN: [id number]  I connected with  TAMALA MANZER on 11/02/20 by phone and verified that I am speaking with the correct person using two identifiers.   I discussed the limitations of evaluation and management by telemedicine. The patient expressed understanding and agreed to proceed.  Location: Patient - [place] residence Physician - clinic  Duration of visit: 17 minutes  HPI She is here for follow up after an ED visit 10/25/20.  She was previously seen by Dr. Megan Salon for possible cellulitis after an ED visit and was given a dose of dalbavancin in may of this year.  No significant warmth or erythema were noted.  She was again taken to the ED by her son who noted significant swelling of her leg.  There was no warmth, no erythema, no purulence.  Pictures reviewed.  She was felt to have cellulitis and given a dose of dalbavance and discharged from the ED.  Visit was with the nephew and patient, who is her caregiver.  She lives alone and is bedbound but has refused to leave her house for a nursing home.     Review of Systems  Constitutional:  Negative for chills and fever.  Gastrointestinal:  Negative for diarrhea and nausea.      Objective:   Physical Exam Neurological:     Mental Status: She is alert.          Assessment & Plan:

## 2021-03-13 DEATH — deceased

## 2022-05-10 IMAGING — US US EXTREM LOW VENOUS
1 series · 13 of 24 positions shown · non-contrast
Comparison: None.

CLINICAL DATA: Bilateral lower extremity cellulitis with pain



[Series 1: us venous img lower bilat (dvt) · portal-venous · 13 of 68 slices shown]
[im 1/68]
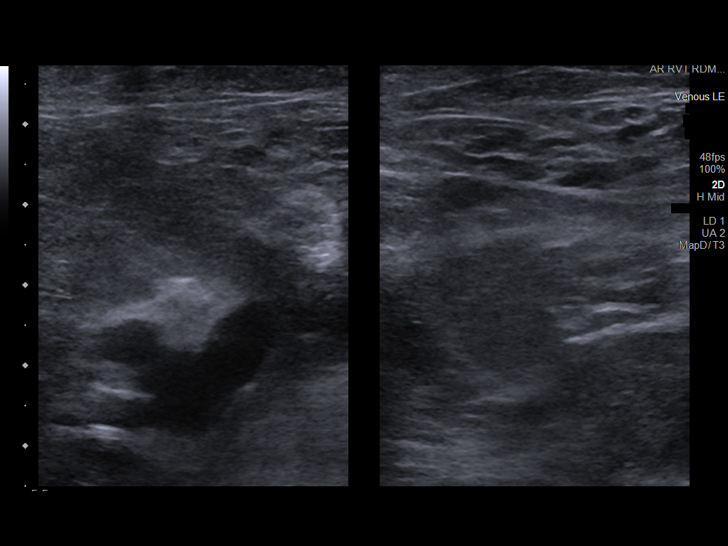
[im 6/68]
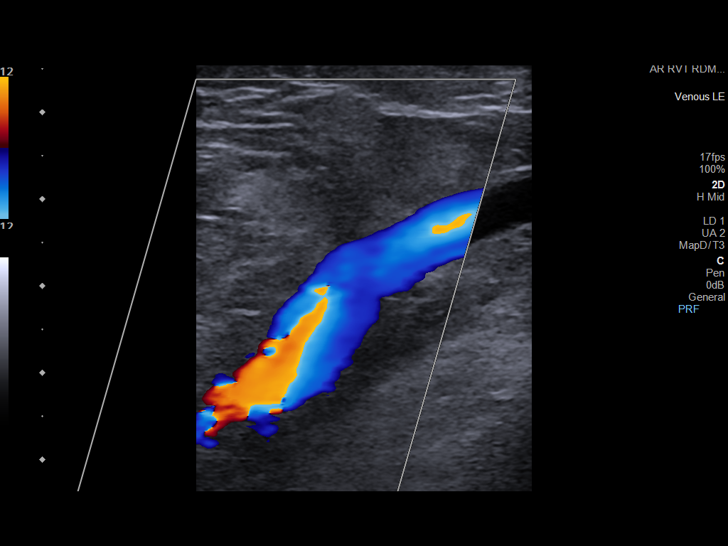
[im 12/68]
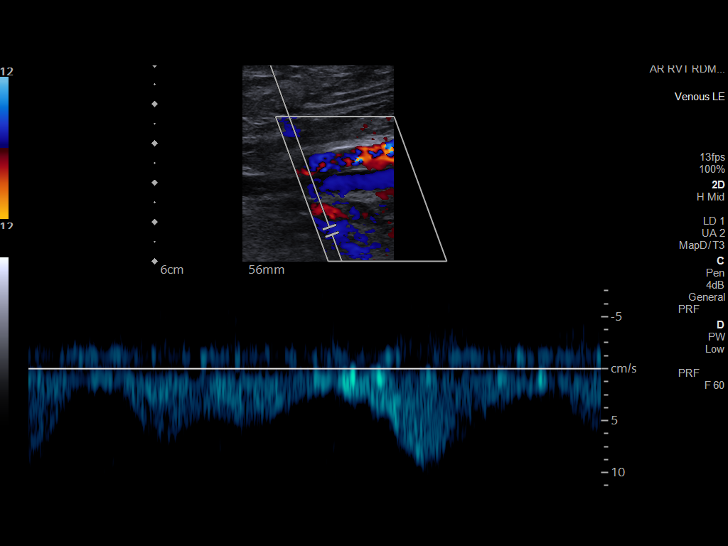
[im 18/68]
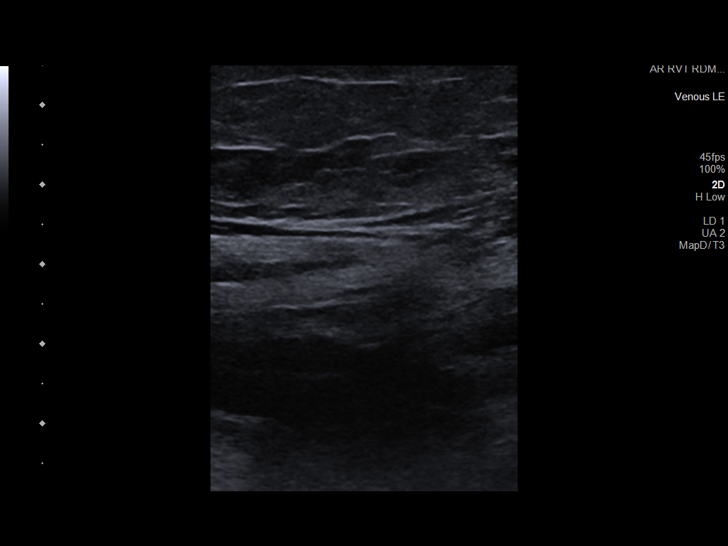
[im 24/68]
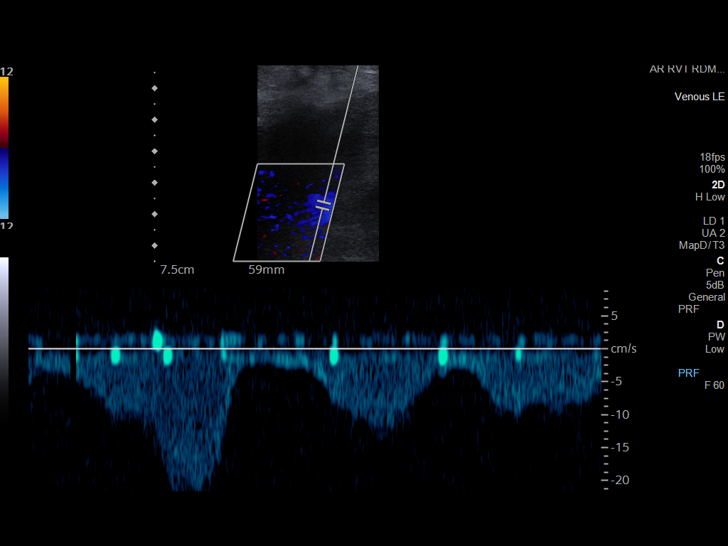
[im 30/68]
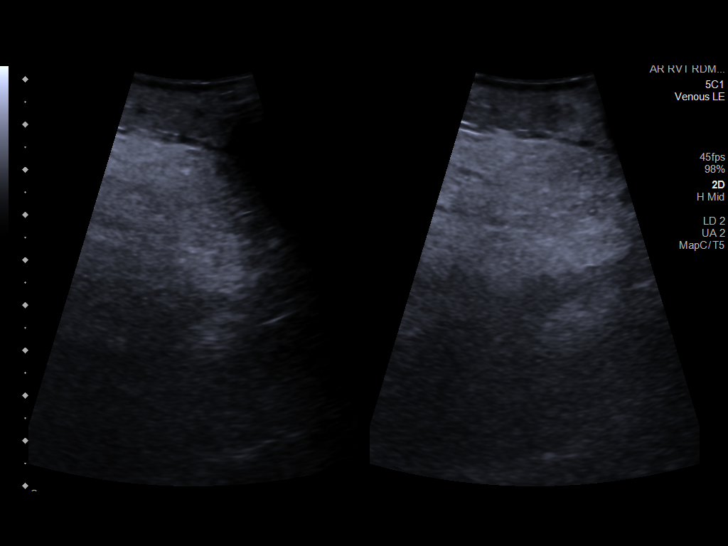
[im 35/68]
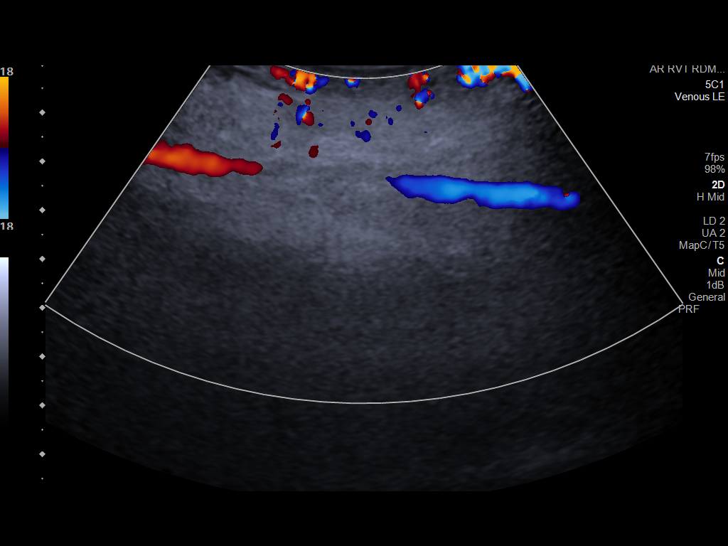
[im 38/68]
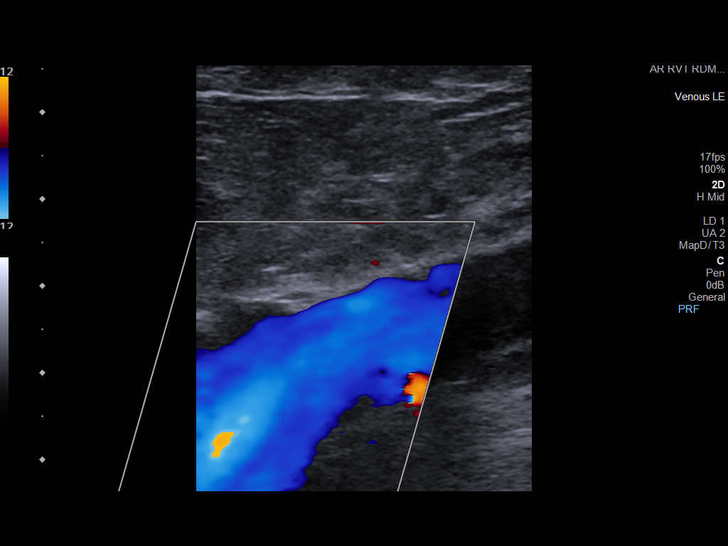
[im 44/68]
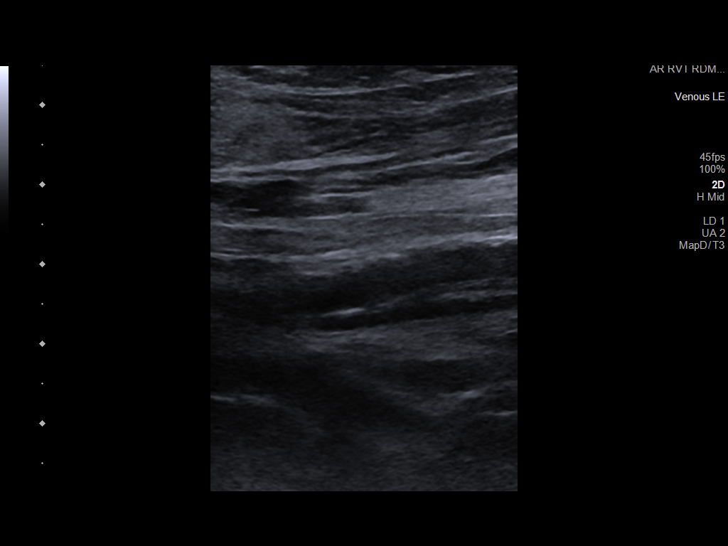
[im 50/68]
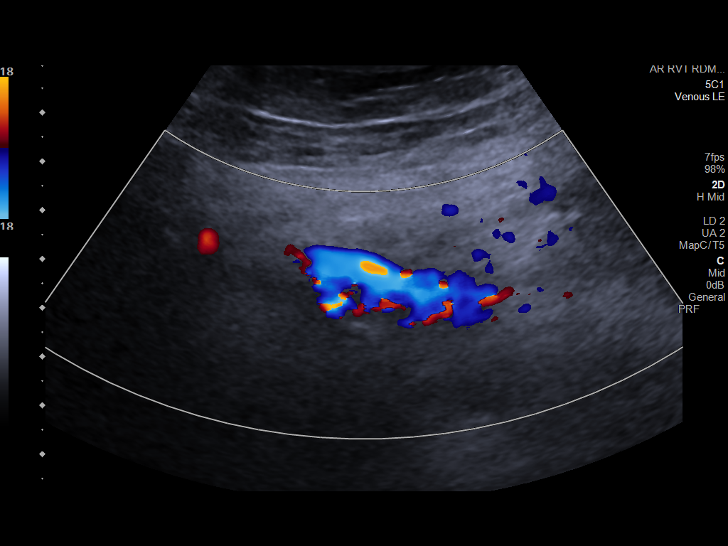
[im 56/68]
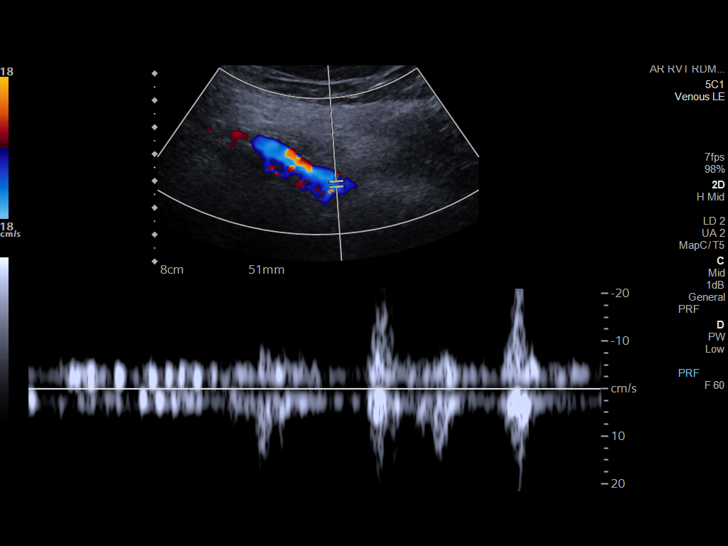
[im 62/68]
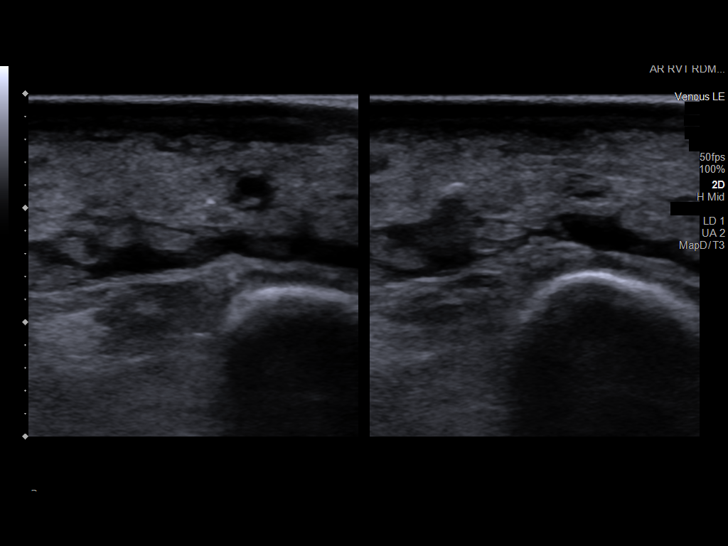
[im 68/68]
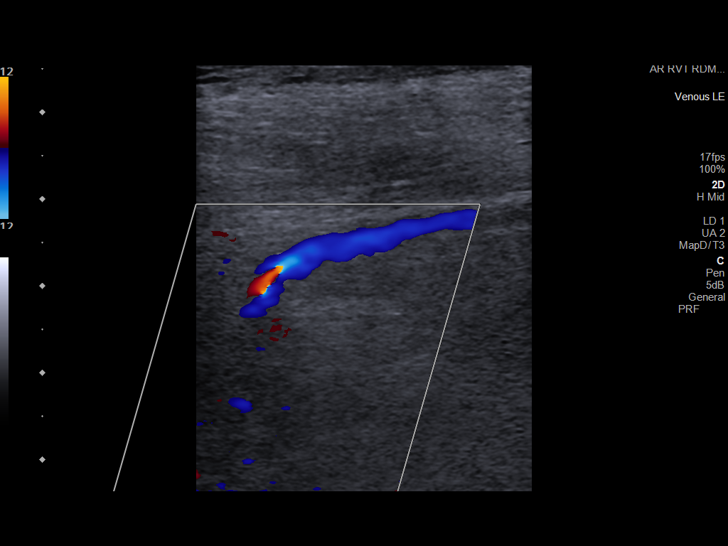

[13 of 24 positions shown; findings below may reference images not displayed]

FINDINGS: RIGHT LOWER EXTREMITY

Common Femoral Vein: No evidence of thrombus. Normal
compressibility, respiratory phasicity and response to augmentation.

Saphenofemoral Junction: No evidence of thrombus. Normal
compressibility and flow on color Doppler imaging.

Profunda Femoral Vein: No evidence of thrombus. Normal
compressibility and flow on color Doppler imaging.

Femoral Vein: No evidence of thrombus. Normal compressibility,
respiratory phasicity and response to augmentation.

Popliteal Vein: No evidence of thrombus. Normal compressibility,
respiratory phasicity and response to augmentation.

Calf Veins: Limited visualization because body habitus. No gross
thrombus appreciated.

Superficial Great Saphenous Vein: No evidence of thrombus. Normal
compressibility.

Other Findings:  Subcutaneous edema noted

LEFT LOWER EXTREMITY

Common Femoral Vein: No evidence of thrombus. Normal
compressibility, respiratory phasicity and response to augmentation.

Saphenofemoral Junction: No evidence of thrombus. Normal
compressibility and flow on color Doppler imaging.

Profunda Femoral Vein: No evidence of thrombus. Normal
compressibility and flow on color Doppler imaging.

Femoral Vein: No evidence of thrombus. Normal compressibility,
respiratory phasicity and response to augmentation.

Popliteal Vein: No evidence of thrombus. Normal compressibility,
respiratory phasicity and response to augmentation.

Calf Veins: Similar limited assessment of the calf veins. No gross
thrombus appreciated.

Superficial Great Saphenous Vein: No evidence of thrombus. Normal
compressibility.

Other Findings:  Subcutaneous edema evident.
IMPRESSION: No significant DVT in either extremity. Limited assessment the calf
veins.

Peripheral calf edema.

## 2022-05-10 IMAGING — DX DG FOOT COMPLETE 3+V*R*
3 series · 3 of 3 positions shown · non-contrast
Comparison: 07/28/2020

CLINICAL DATA: Edema, BILATERAL lower extremity redness, swelling
and ulcers to RIGHT foot and ankle, question osteomyelitis, history
breast cancer

EXAM:
RIGHT FOOT COMPLETE - 3+ VIEW

[foot ap]
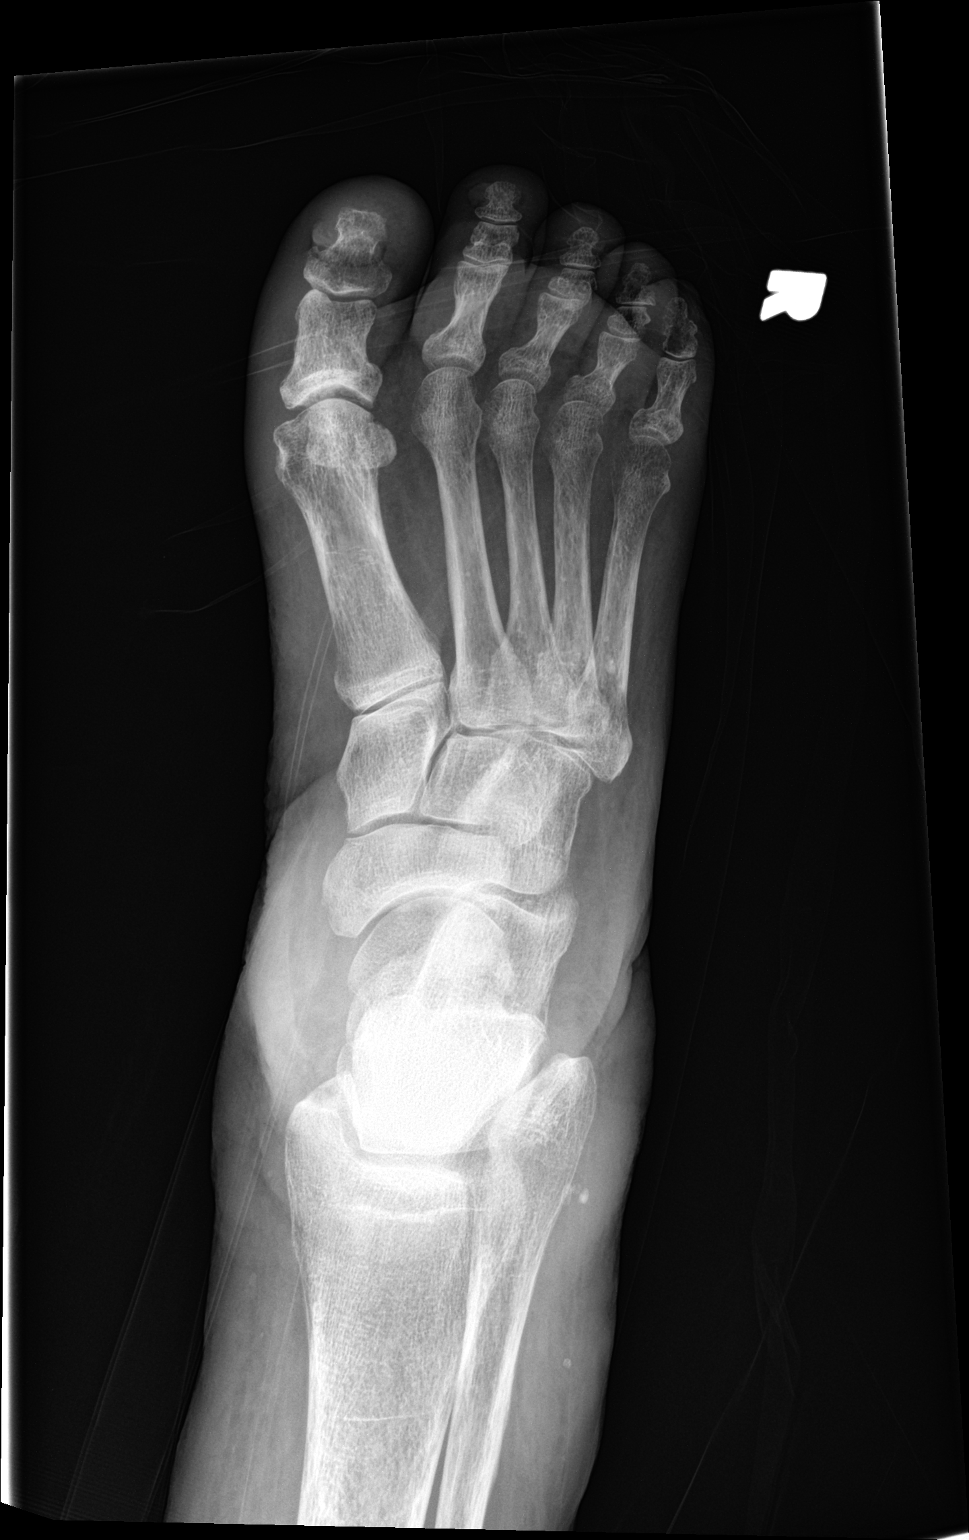

[foot obl]
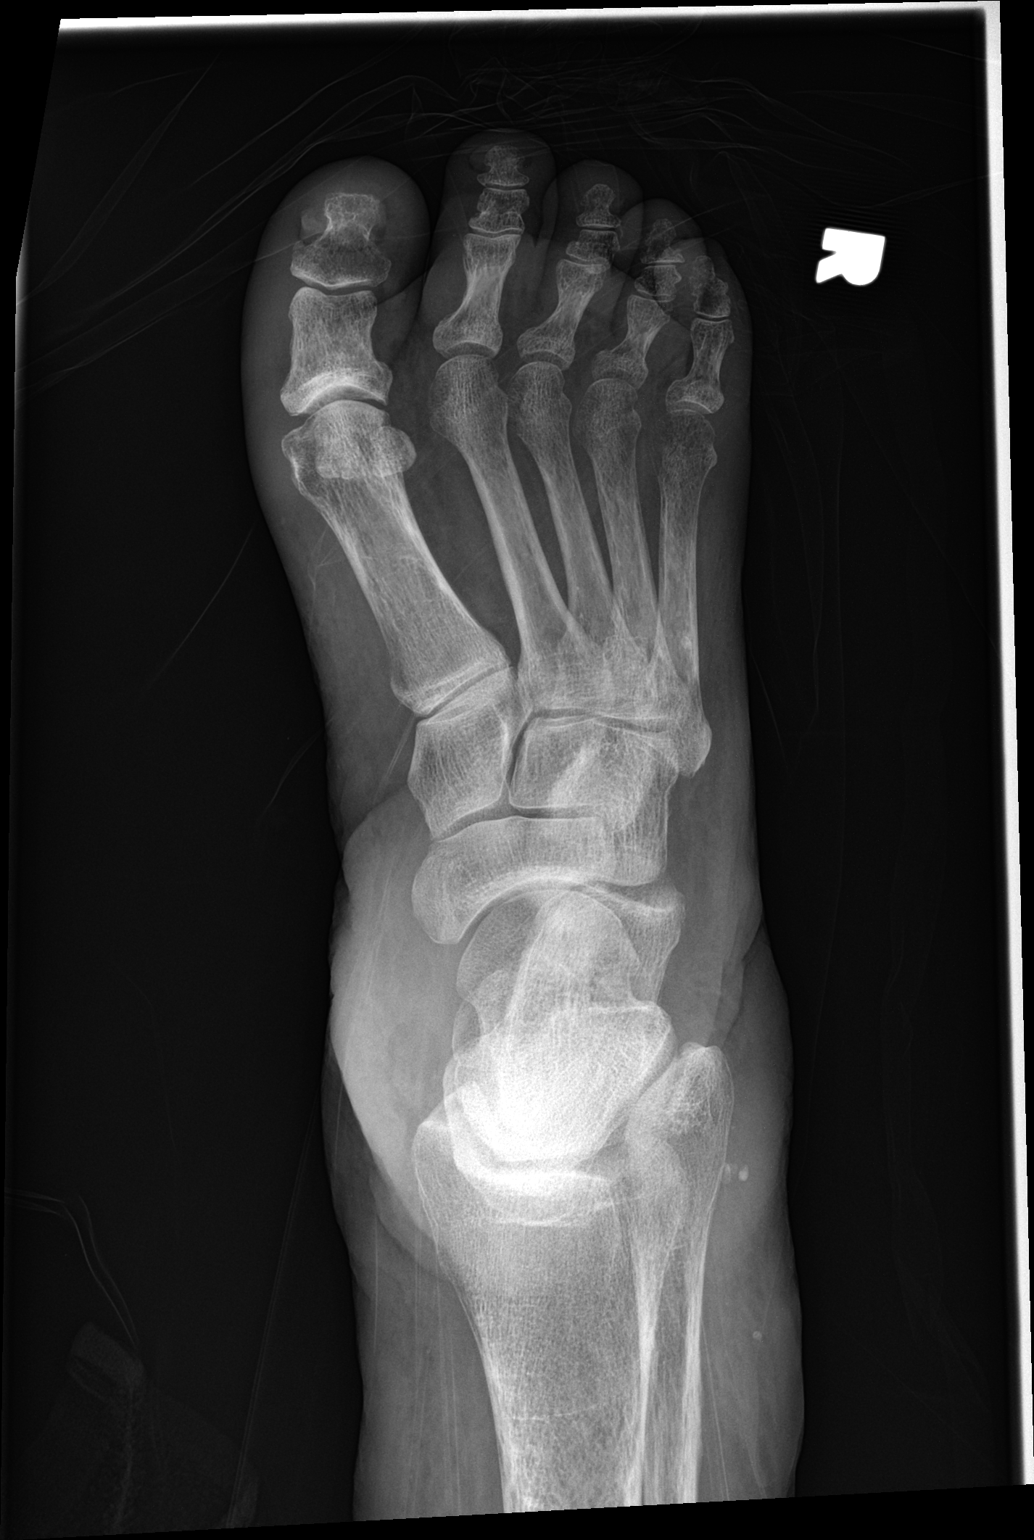

[foot lat]
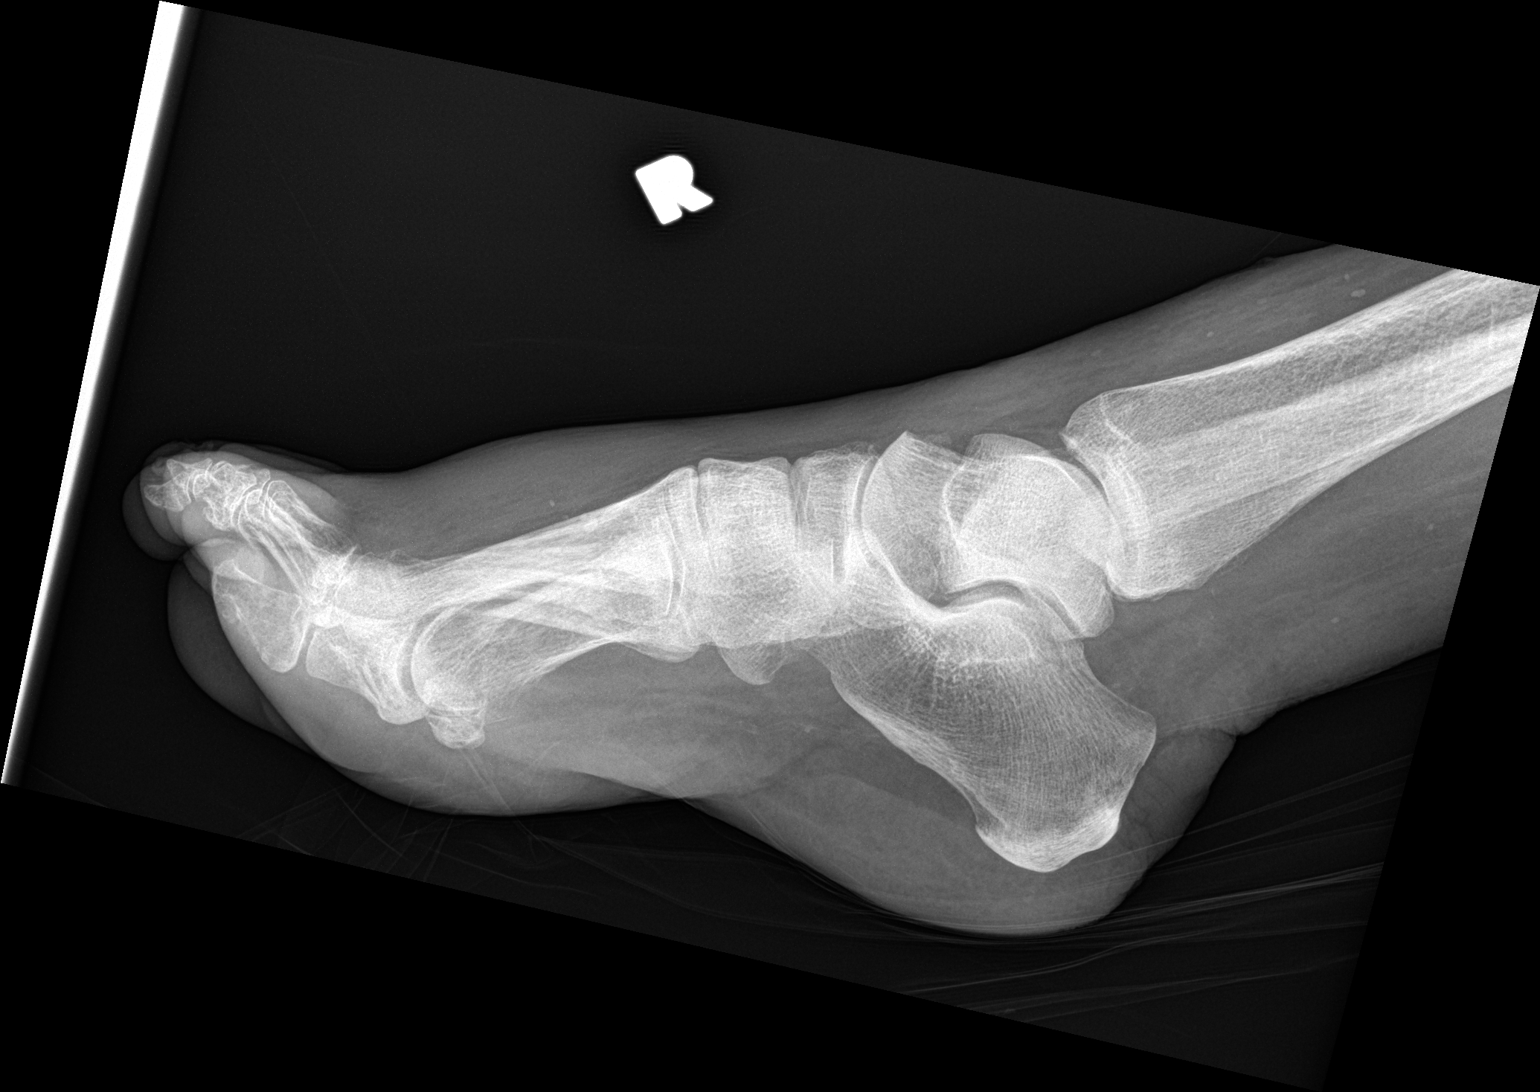

[3 of 3 positions shown; findings below may reference images not displayed]

FINDINGS: Osseous demineralization.

Joint spaces preserved.

Diffuse soft tissue swelling.

No acute fracture, dislocation, or bone destruction.

Specifically no cortical destruction identified to suggest
osteomyelitis and no soft tissue gas seen.
IMPRESSION: Soft tissue swelling without acute bony abnormalities.

## 2022-05-10 IMAGING — DX DG ANKLE COMPLETE 3+V*R*
3 series · 3 of 3 positions shown · non-contrast
Comparison: None

CLINICAL DATA: Ulcer at lateral aspect of distal tibia, edema,
question osteomyelitis

EXAM:
RIGHT ANKLE - COMPLETE 3+ VIEW

[ankle ap]
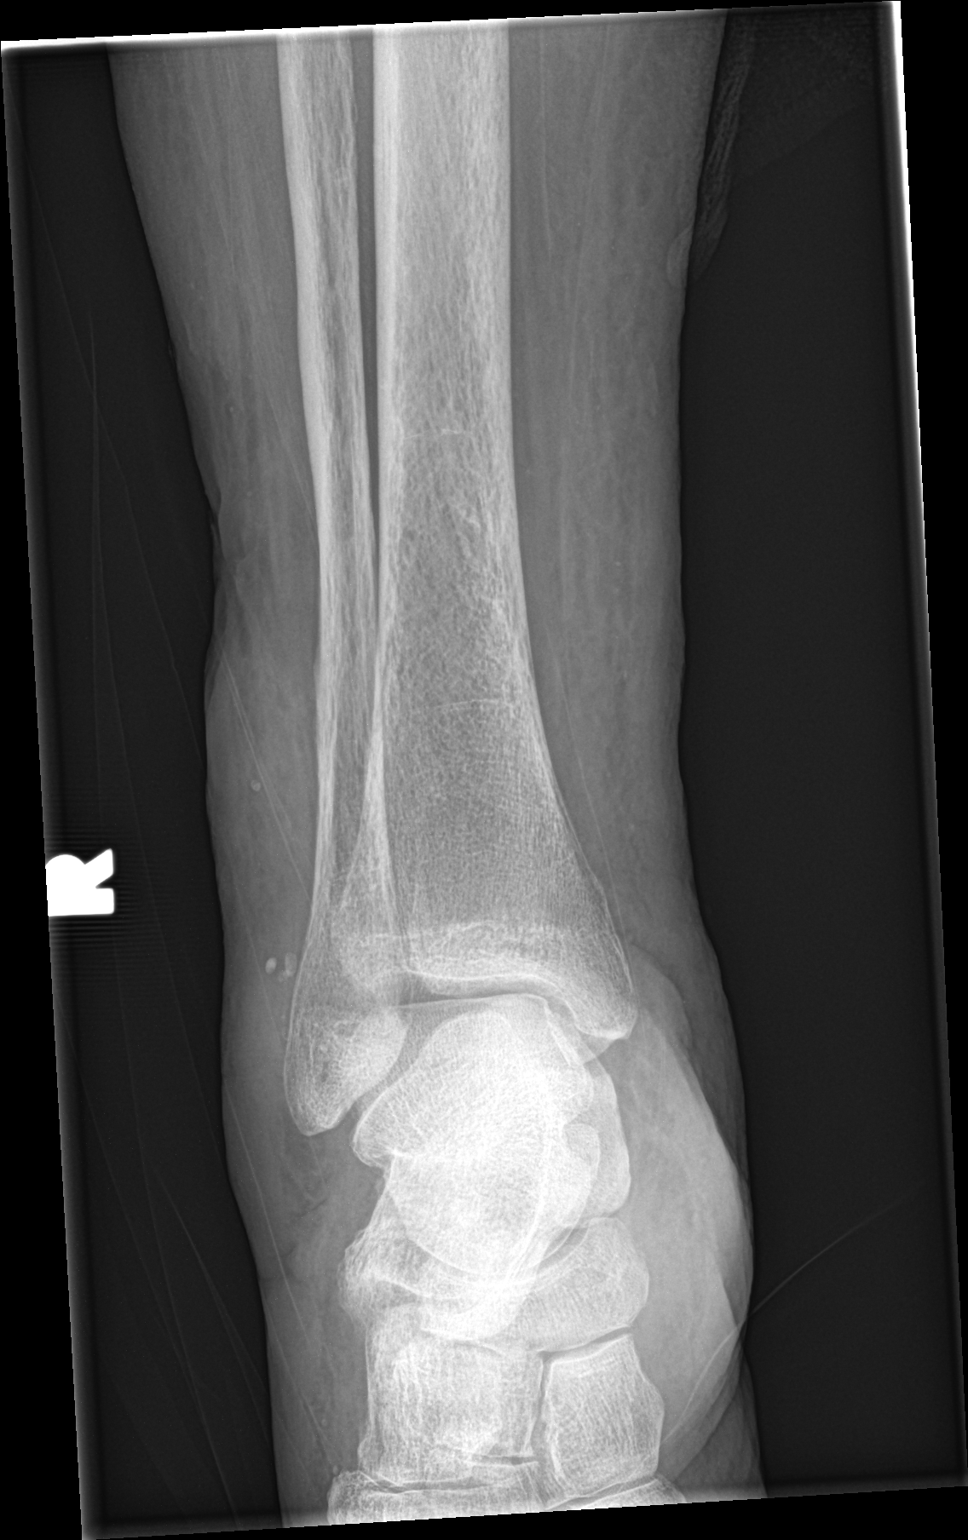

[ankle obl]
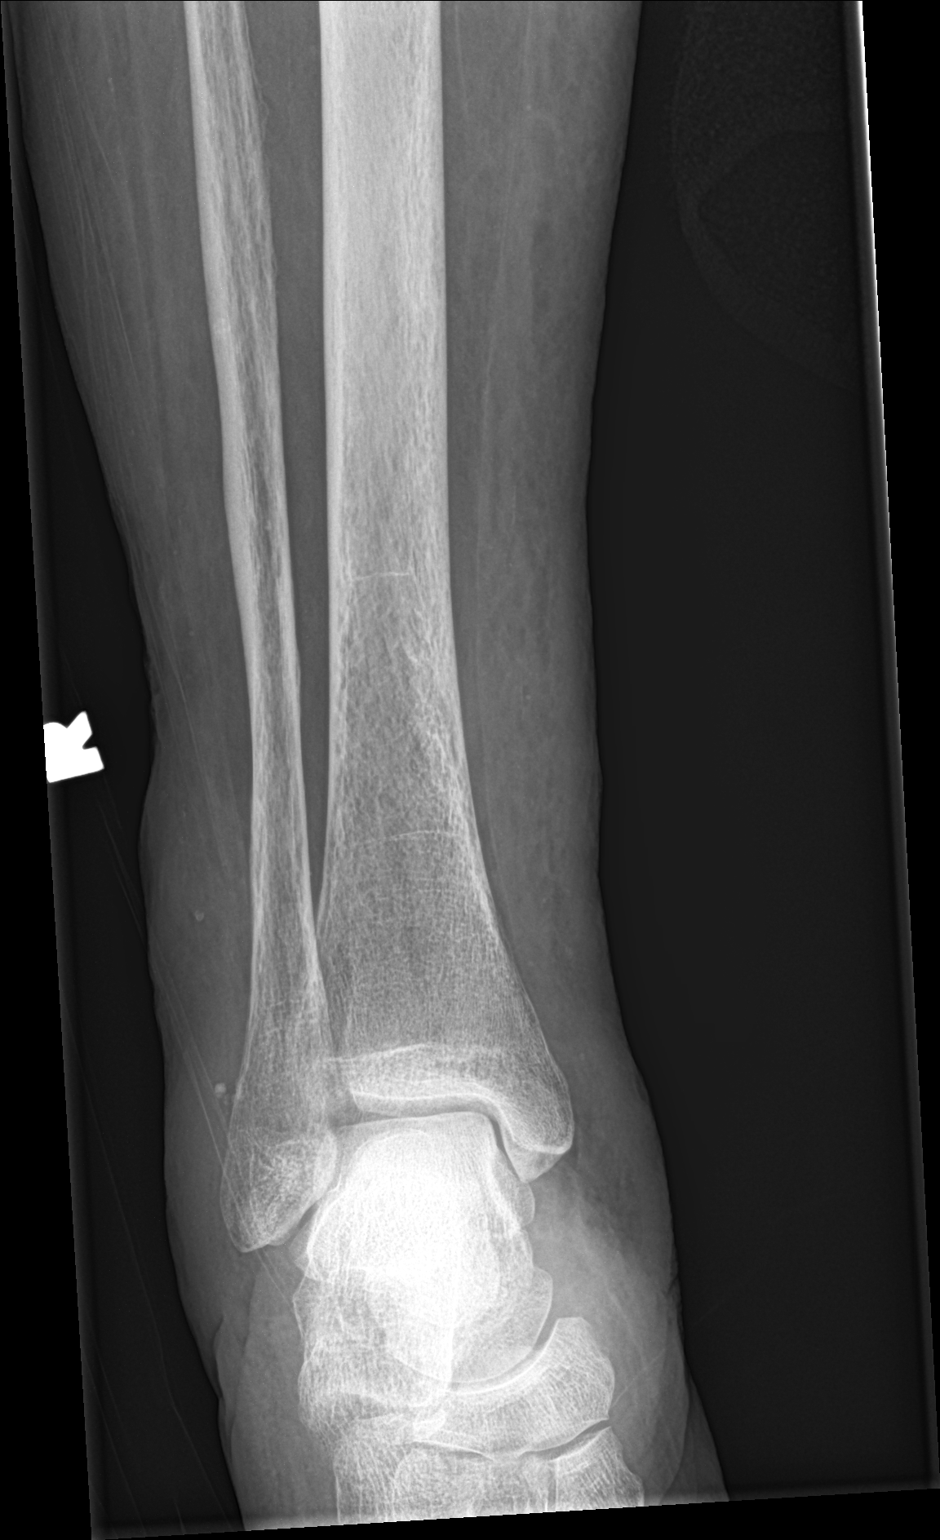

[ankle lat]
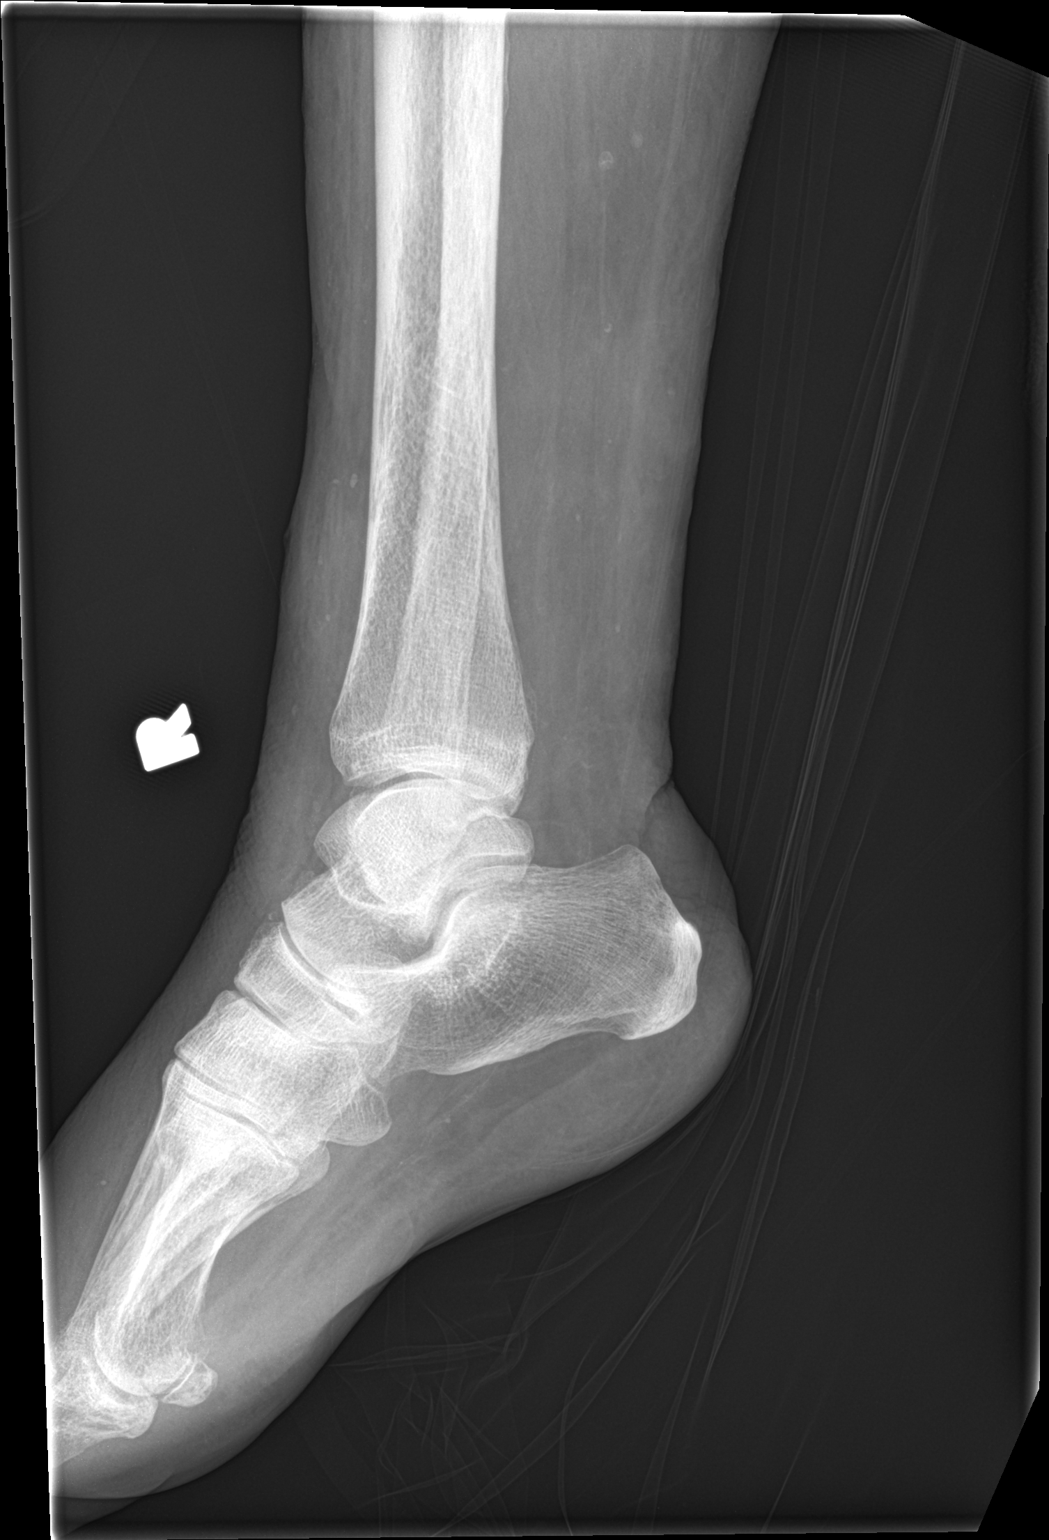

[3 of 3 positions shown; findings below may reference images not displayed]

FINDINGS: Diffuse soft tissue swelling of distal RIGHT lower leg and RIGHT
ankle into foot.

Osseous demineralization normal.

Joint spaces preserved.

No acute fracture, dislocation, or bone destruction.
IMPRESSION: Soft tissue swelling without acute osseous abnormalities.
